# Patient Record
Sex: Female | Born: 1950 | Race: White | Hispanic: No | Marital: Married | Smoking: Never smoker
Health system: Southern US, Community
[De-identification: ages and names within clinical notes are randomized; demographics above are authoritative.]

## PROBLEM LIST (undated history)

## (undated) DIAGNOSIS — E119 Type 2 diabetes mellitus without complications: Secondary | ICD-10-CM

## (undated) DIAGNOSIS — R0600 Dyspnea, unspecified: Secondary | ICD-10-CM

## (undated) DIAGNOSIS — G4730 Sleep apnea, unspecified: Secondary | ICD-10-CM

## (undated) DIAGNOSIS — I5042 Chronic combined systolic (congestive) and diastolic (congestive) heart failure: Secondary | ICD-10-CM

## (undated) DIAGNOSIS — I10 Essential (primary) hypertension: Secondary | ICD-10-CM

## (undated) DIAGNOSIS — K219 Gastro-esophageal reflux disease without esophagitis: Secondary | ICD-10-CM

## (undated) DIAGNOSIS — E785 Hyperlipidemia, unspecified: Secondary | ICD-10-CM

## (undated) HISTORY — DX: Type 2 diabetes mellitus without complications: E11.9

## (undated) HISTORY — DX: Chronic combined systolic (congestive) and diastolic (congestive) heart failure: I50.42

## (undated) HISTORY — PX: NO PAST SURGERIES: SHX2092

## (undated) HISTORY — DX: Gastro-esophageal reflux disease without esophagitis: K21.9

## (undated) HISTORY — DX: Hyperlipidemia, unspecified: E78.5

---

## 2003-03-10 ENCOUNTER — Ambulatory Visit (HOSPITAL_COMMUNITY): Admission: RE | Admit: 2003-03-10 | Discharge: 2003-03-10 | Payer: Self-pay | Admitting: Family Medicine

## 2003-03-10 ENCOUNTER — Encounter: Payer: Self-pay | Admitting: Family Medicine

## 2005-01-16 ENCOUNTER — Ambulatory Visit (HOSPITAL_COMMUNITY): Admission: RE | Admit: 2005-01-16 | Discharge: 2005-01-16 | Payer: Self-pay | Admitting: Family Medicine

## 2008-09-29 ENCOUNTER — Ambulatory Visit (HOSPITAL_COMMUNITY): Admission: RE | Admit: 2008-09-29 | Discharge: 2008-09-29 | Payer: Self-pay | Admitting: Family Medicine

## 2009-10-01 ENCOUNTER — Ambulatory Visit (HOSPITAL_COMMUNITY): Admission: RE | Admit: 2009-10-01 | Discharge: 2009-10-01 | Payer: Self-pay | Admitting: Family Medicine

## 2010-11-01 ENCOUNTER — Other Ambulatory Visit (HOSPITAL_COMMUNITY): Payer: Self-pay | Admitting: Family Medicine

## 2010-11-01 DIAGNOSIS — Z139 Encounter for screening, unspecified: Secondary | ICD-10-CM

## 2010-11-04 ENCOUNTER — Ambulatory Visit (HOSPITAL_COMMUNITY)
Admission: RE | Admit: 2010-11-04 | Discharge: 2010-11-04 | Disposition: A | Discharge status: Home / Self Care | Payer: Self-pay | Source: Ambulatory Visit | Attending: Family Medicine | Admitting: Family Medicine

## 2010-11-04 DIAGNOSIS — Z139 Encounter for screening, unspecified: Secondary | ICD-10-CM

## 2011-12-05 ENCOUNTER — Other Ambulatory Visit (HOSPITAL_COMMUNITY): Payer: Self-pay | Admitting: Family Medicine

## 2011-12-05 DIAGNOSIS — Z1231 Encounter for screening mammogram for malignant neoplasm of breast: Secondary | ICD-10-CM

## 2011-12-07 ENCOUNTER — Ambulatory Visit (HOSPITAL_COMMUNITY)
Admission: RE | Admit: 2011-12-07 | Discharge: 2011-12-07 | Disposition: A | Discharge status: Home / Self Care | Payer: Self-pay | Source: Ambulatory Visit | Attending: Family Medicine | Admitting: Family Medicine

## 2011-12-07 DIAGNOSIS — Z1231 Encounter for screening mammogram for malignant neoplasm of breast: Secondary | ICD-10-CM

## 2013-11-27 ENCOUNTER — Other Ambulatory Visit (HOSPITAL_COMMUNITY): Payer: Self-pay | Admitting: Family Medicine

## 2013-11-27 DIAGNOSIS — Z1231 Encounter for screening mammogram for malignant neoplasm of breast: Secondary | ICD-10-CM

## 2013-12-08 ENCOUNTER — Ambulatory Visit (HOSPITAL_COMMUNITY)
Admission: RE | Admit: 2013-12-08 | Discharge: 2013-12-08 | Disposition: A | Discharge status: Home / Self Care | Payer: BC Managed Care – PPO | Source: Ambulatory Visit | Attending: Family Medicine | Admitting: Family Medicine

## 2013-12-08 DIAGNOSIS — Z1231 Encounter for screening mammogram for malignant neoplasm of breast: Secondary | ICD-10-CM | POA: Insufficient documentation

## 2014-11-17 ENCOUNTER — Other Ambulatory Visit (HOSPITAL_COMMUNITY): Payer: Self-pay | Admitting: Family Medicine

## 2014-11-17 DIAGNOSIS — Z1231 Encounter for screening mammogram for malignant neoplasm of breast: Secondary | ICD-10-CM

## 2014-12-11 ENCOUNTER — Ambulatory Visit (HOSPITAL_COMMUNITY)
Admission: RE | Admit: 2014-12-11 | Discharge: 2014-12-11 | Disposition: A | Discharge status: Home / Self Care | Payer: 59 | Source: Ambulatory Visit | Attending: Family Medicine | Admitting: Family Medicine

## 2014-12-11 DIAGNOSIS — Z1231 Encounter for screening mammogram for malignant neoplasm of breast: Secondary | ICD-10-CM

## 2015-12-15 ENCOUNTER — Other Ambulatory Visit (HOSPITAL_COMMUNITY): Payer: Self-pay | Admitting: Internal Medicine

## 2015-12-15 DIAGNOSIS — Z1231 Encounter for screening mammogram for malignant neoplasm of breast: Secondary | ICD-10-CM

## 2015-12-17 ENCOUNTER — Ambulatory Visit (HOSPITAL_COMMUNITY)
Admission: RE | Admit: 2015-12-17 | Discharge: 2015-12-17 | Disposition: A | Discharge status: Home / Self Care | Payer: BLUE CROSS/BLUE SHIELD | Source: Ambulatory Visit | Attending: Internal Medicine | Admitting: Internal Medicine

## 2015-12-17 DIAGNOSIS — Z1231 Encounter for screening mammogram for malignant neoplasm of breast: Secondary | ICD-10-CM | POA: Diagnosis present

## 2016-01-12 ENCOUNTER — Encounter: Payer: Self-pay | Admitting: Internal Medicine

## 2016-06-13 ENCOUNTER — Encounter (INDEPENDENT_AMBULATORY_CARE_PROVIDER_SITE_OTHER): Payer: Self-pay | Admitting: *Deleted

## 2016-11-06 ENCOUNTER — Encounter (INDEPENDENT_AMBULATORY_CARE_PROVIDER_SITE_OTHER): Payer: Self-pay | Admitting: *Deleted

## 2016-11-06 ENCOUNTER — Other Ambulatory Visit (INDEPENDENT_AMBULATORY_CARE_PROVIDER_SITE_OTHER): Payer: Self-pay | Admitting: *Deleted

## 2016-11-06 ENCOUNTER — Telehealth (INDEPENDENT_AMBULATORY_CARE_PROVIDER_SITE_OTHER): Payer: Self-pay | Admitting: *Deleted

## 2016-11-06 DIAGNOSIS — Z1211 Encounter for screening for malignant neoplasm of colon: Secondary | ICD-10-CM

## 2016-11-06 MED ORDER — SOD PHOS MONO-SOD PHOS DIBASIC 1.102-0.398 G PO TABS: 1.0000 | ORAL_TABLET | Freq: Once | ORAL | 0 refills | Status: AC

## 2016-11-06 NOTE — Telephone Encounter (Signed)
Patient needs osmo pills 

## 2016-11-14 ENCOUNTER — Encounter (INDEPENDENT_AMBULATORY_CARE_PROVIDER_SITE_OTHER): Payer: Self-pay | Admitting: *Deleted

## 2016-11-14 ENCOUNTER — Telehealth (INDEPENDENT_AMBULATORY_CARE_PROVIDER_SITE_OTHER): Payer: Self-pay | Admitting: *Deleted

## 2016-11-14 NOTE — Telephone Encounter (Signed)
Patient needs trilyte 

## 2016-11-15 MED ORDER — PEG 3350-KCL-NA BICARB-NACL 420 G PO SOLR: 4000.0000 mL | Freq: Once | ORAL | 0 refills | Status: AC

## 2016-11-22 ENCOUNTER — Telehealth (INDEPENDENT_AMBULATORY_CARE_PROVIDER_SITE_OTHER): Payer: Self-pay | Admitting: *Deleted

## 2016-11-22 NOTE — Telephone Encounter (Signed)
agree

## 2016-11-22 NOTE — Telephone Encounter (Signed)
Referring MD/PCP: Jeneen Rinks kim   Procedure: tcs  Reason/Indication:  screening  Has patient had this procedure before?  no  If so, when, by whom and where?    Is there a family history of colon cancer?  no  Who?  What age when diagnosed?    Is patient diabetic?   no      Does patient have prosthetic heart valve or mechanical valve?  no  Do you have a pacemaker?  no  Has patient ever had endocarditis? no  Has patient had joint replacement within last 12 months?  no  Does patient tend to be constipated or take laxatives? no  Does patient have a history of alcohol/drug use?  no  Is patient on Coumadin, Plavix and/or Aspirin? yes  Medications: asa 81 mg daily, losartan 100/25 mg daily, amlodipine 10 mg daily, hctz 25 mg daily  Allergies: nkda  Medication Adjustment per Dr Laural Golden: asa 2 days  Procedure date & time: 12/21/16 at 930

## 2016-12-21 ENCOUNTER — Ambulatory Visit (HOSPITAL_COMMUNITY)
Admission: RE | Admit: 2016-12-21 | Discharge: 2016-12-21 | Disposition: A | Discharge status: Home / Self Care | Payer: BLUE CROSS/BLUE SHIELD | Source: Ambulatory Visit | Attending: Internal Medicine | Admitting: Internal Medicine

## 2016-12-21 ENCOUNTER — Encounter (HOSPITAL_COMMUNITY)
Admission: RE | Disposition: A | Discharge status: Home / Self Care | Payer: Self-pay | Source: Ambulatory Visit | Attending: Internal Medicine

## 2016-12-21 ENCOUNTER — Encounter (HOSPITAL_COMMUNITY): Payer: Self-pay | Admitting: *Deleted

## 2016-12-21 DIAGNOSIS — D123 Benign neoplasm of transverse colon: Secondary | ICD-10-CM | POA: Diagnosis not present

## 2016-12-21 DIAGNOSIS — K5730 Diverticulosis of large intestine without perforation or abscess without bleeding: Secondary | ICD-10-CM | POA: Insufficient documentation

## 2016-12-21 DIAGNOSIS — Z1211 Encounter for screening for malignant neoplasm of colon: Secondary | ICD-10-CM | POA: Diagnosis not present

## 2016-12-21 DIAGNOSIS — Z79899 Other long term (current) drug therapy: Secondary | ICD-10-CM | POA: Insufficient documentation

## 2016-12-21 DIAGNOSIS — K621 Rectal polyp: Secondary | ICD-10-CM | POA: Insufficient documentation

## 2016-12-21 DIAGNOSIS — Z7982 Long term (current) use of aspirin: Secondary | ICD-10-CM | POA: Insufficient documentation

## 2016-12-21 DIAGNOSIS — K648 Other hemorrhoids: Secondary | ICD-10-CM

## 2016-12-21 DIAGNOSIS — I10 Essential (primary) hypertension: Secondary | ICD-10-CM | POA: Diagnosis not present

## 2016-12-21 HISTORY — DX: Essential (primary) hypertension: I10

## 2016-12-21 HISTORY — PX: COLONOSCOPY: SHX5424

## 2016-12-21 SURGERY — COLONOSCOPY
Anesthesia: Moderate Sedation

## 2016-12-21 MED ORDER — STERILE WATER FOR IRRIGATION IR SOLN
Status: DC | PRN
  Administered 2016-12-21: 4 mL

## 2016-12-21 MED ORDER — MIDAZOLAM HCL 5 MG/5ML IJ SOLN
INTRAMUSCULAR | Status: DC | PRN
  Administered 2016-12-21 (×2): 2 mg via INTRAVENOUS

## 2016-12-21 MED ORDER — MIDAZOLAM HCL 5 MG/5ML IJ SOLN
INTRAMUSCULAR | Status: AC
  Filled 2016-12-21: qty 10

## 2016-12-21 MED ORDER — MEPERIDINE HCL 50 MG/ML IJ SOLN
INTRAMUSCULAR | Status: AC
  Filled 2016-12-21: qty 1

## 2016-12-21 MED ORDER — SODIUM CHLORIDE 0.9 % IV SOLN
INTRAVENOUS | Status: DC
  Administered 2016-12-21: 10:00:00 via INTRAVENOUS

## 2016-12-21 MED ORDER — MEPERIDINE HCL 50 MG/ML IJ SOLN
INTRAMUSCULAR | Status: DC | PRN
  Administered 2016-12-21 (×2): 25 mg via INTRAVENOUS

## 2016-12-21 NOTE — Discharge Instructions (Addendum)
High-Fiber Diet Fiber, also called dietary fiber, is a type of carbohydrate found in fruits, vegetables, whole grains, and beans. A high-fiber diet can have many health benefits. Your health care provider may recommend a high-fiber diet to help:  Prevent constipation. Fiber can make your bowel movements more regular.  Lower your cholesterol.  Relieve hemorrhoids, uncomplicated diverticulosis, or irritable bowel syndrome.  Prevent overeating as part of a weight-loss plan.  Prevent heart disease, type 2 diabetes, and certain cancers. What is my plan? The recommended daily intake of fiber includes:  38 grams for men under age 6.  64 grams for men over age 17.  69 grams for women under age 52.  31 grams for women over age 10. You can get the recommended daily intake of dietary fiber by eating a variety of fruits, vegetables, grains, and beans. Your health care provider may also recommend a fiber supplement if it is not possible to get enough fiber through your diet. What do I need to know about a high-fiber diet?  Fiber supplements have not been widely studied for their effectiveness, so it is better to get fiber through food sources.  Always check the fiber content on thenutrition facts label of any prepackaged food. Look for foods that contain at least 5 grams of fiber per serving.  Ask your dietitian if you have questions about specific foods that are related to your condition, especially if those foods are not listed in the following section.  Increase your daily fiber consumption gradually. Increasing your intake of dietary fiber too quickly may cause bloating, cramping, or gas.  Drink plenty of water. Water helps you to digest fiber. What foods can I eat? Grains  Whole-grain breads. Multigrain cereal. Oats and oatmeal. Brown rice. Barley. Bulgur wheat. [place]. Bran muffins. Popcorn. Rye wafer crackers. Vegetables  Sweet potatoes. Spinach. Kale. Artichokes. Cabbage. Broccoli.  Green peas. Carrots. Squash. Fruits  Berries. Pears. Apples. Oranges. Avocados. Prunes and raisins. Dried figs. Meats and Other Protein Sources  [place], kidney, pinto, and soy beans. Split peas. Lentils. Nuts and seeds. Dairy  Fiber-fortified yogurt. Beverages  Fiber-fortified soy milk. Fiber-fortified orange juice. Other  Fiber bars. The items listed above may not be a complete list of recommended foods or beverages. Contact your dietitian for more options.  What foods are not recommended? Grains  White bread. Pasta made with refined flour. White rice. Vegetables  Fried potatoes. Canned vegetables. Well-cooked vegetables. Fruits  Fruit juice. Cooked, strained fruit. Meats and Other Protein Sources  Fatty cuts of meat. Fried Sales executive or fried fish. Dairy  Milk. Yogurt. Cream cheese. Sour cream. Beverages  Soft drinks. Other  Cakes and pastries. Butter and oils. The items listed above may not be a complete list of foods and beverages to avoid. Contact your dietitian for more information.  What are some tips for including high-fiber foods in my diet?  Eat a wide variety of high-fiber foods.  Make sure that half of all grains consumed each day are whole grains.  Replace breads and cereals made from refined flour or white flour with whole-grain breads and cereals.  Replace white rice with brown rice, bulgur wheat, or millet.  Start the day with a breakfast that is high in fiber, such as a cereal that contains at least 5 grams of fiber per serving.  Use beans in place of meat in soups, salads, or pasta.  Eat high-fiber snacks, such as berries, raw vegetables, nuts, or popcorn. This information is not intended to replace  advice given to you by your health care provider. Make sure you discuss any questions you have with your health care provider. Document Released: 09/11/2005 Document Revised: 02/17/2016 Document Reviewed: 02/24/2014 Elsevier Interactive Patient Education  2017  [place].     Colon Polyps Polyps are tissue growths inside the body. Polyps can grow in many places, including the large intestine (colon). A polyp may be a round bump or a mushroom-shaped growth. You could have one polyp or several. Most colon polyps are noncancerous (benign). However, some colon polyps can become cancerous over time. What are the causes? The exact cause of colon polyps is not known. What increases the risk? This condition is more likely to develop in people who:  Have a family history of colon cancer or colon polyps.  Are older than 53 or older than 45 if they are African American.  Have inflammatory bowel disease, such as ulcerative colitis or Crohn disease.  Are overweight.  Smoke cigarettes.  Do not get enough exercise.  Drink too much alcohol.  Eat a diet that is:  High in fat and red meat.  Low in fiber.  Had childhood cancer that was treated with abdominal radiation. What are the signs or symptoms? Most polyps do not cause symptoms. If you have symptoms, they may include:  Blood coming from your rectum when having a bowel movement.  Blood in your stool.The stool may look dark red or black.  A change in bowel habits, such as constipation or diarrhea. How is this diagnosed? This condition is diagnosed with a colonoscopy. This is a procedure that uses a lighted, flexible scope to look at the inside of your colon. How is this treated? Treatment for this condition involves removing any polyps that are found. Those polyps will then be tested for cancer. If cancer is found, your health care provider will talk to you about options for colon cancer treatment. Follow these instructions at home: Diet   Eat plenty of fiber, such as fruits, vegetables, and whole grains.  Eat foods that are high in calcium and vitamin D, such as milk, cheese, yogurt, eggs, liver, fish, and broccoli.  Limit foods high in fat, red meats, and processed meats, such as  hot dogs, sausage, bacon, and lunch meats.  Maintain a healthy weight, or lose weight if recommended by your health care provider. General instructions   Do not smoke cigarettes.  Do not drink alcohol excessively.  Keep all follow-up visits as told by your health care provider. This is important. This includes keeping regularly scheduled colonoscopies. Talk to your health care provider about when you need a colonoscopy.  Exercise every day or as told by your health care provider. Contact a health care provider if:  You have new or worsening bleeding during a bowel movement.  You have new or increased blood in your stool.  You have a change in bowel habits.  You unexpectedly lose weight. This information is not intended to replace advice given to you by your health care provider. Make sure you discuss any questions you have with your health care provider. Document Released: 06/07/2004 Document Revised: 02/17/2016 Document Reviewed: 08/02/2015 Elsevier Interactive Patient Education  2017 [place]. Colonoscopy, Adult, Care After This sheet gives you information about how to care for yourself after your procedure. Your health care provider may also give you more specific instructions. If you have problems or questions, contact your health care provider. What can I expect after the procedure? After the procedure, it  is common to have:  A small amount of blood in your stool for 24 hours after the procedure.  Some gas.  Mild abdominal cramping or bloating. Follow these instructions at home: General instructions    For the first 24 hours after the procedure:  Do not drive or use machinery.  Do not sign important documents.  Do not drink alcohol.  Do your regular daily activities at a slower pace than normal.  Eat soft, easy-to-digest foods.  Rest often.  Take over-the-counter or prescription medicines only as told by your health care provider.  It is up to you to  get the results of your procedure. Ask your health care provider, or the department performing the procedure, when your results will be ready. Relieving cramping and bloating   Try walking around when you have cramps or feel bloated.  Apply heat to your abdomen as told by your health care provider. Use a heat source that your health care provider recommends, such as a moist heat pack or a heating pad.  Place a towel between your skin and the heat source.  Leave the heat on for 20-30 minutes.  Remove the heat if your skin turns bright red. This is especially important if you are unable to feel pain, heat, or cold. You may have a greater risk of getting burned. Eating and drinking   Drink enough fluid to keep your urine clear or pale yellow.  Resume your normal diet as instructed by your health care provider. Avoid heavy or fried foods that are hard to digest.  Avoid drinking alcohol for as long as instructed by your health care provider. Contact a health care provider if:  You have blood in your stool 2-3 days after the procedure. Get help right away if:  You have more than a small spotting of blood in your stool.  You pass large blood clots in your stool.  Your abdomen is swollen.  You have nausea or vomiting.  You have a fever.  You have increasing abdominal pain that is not relieved with medicine. This information is not intended to replace advice given to you by your health care provider. Make sure you discuss any questions you have with your health care provider. Document Released: 04/25/2004 Document Revised: 06/05/2016 Document Reviewed: 11/23/2015 Elsevier Interactive Patient Education  2017 [place].        No aspirin or NSAIDs for 1 week. Resume other medications and high fiber diet. No driving for 24 hours. Physician will call with biopsy results.

## 2016-12-21 NOTE — Op Note (Signed)
[facility] Patient Name: Kristina [name] Procedure Date: 12/21/2016 11:02 AM MRN: [id number] Date of Birth: 1950/12/10 Attending MD: Hildred Laser , MD CSN: [id number] Age: 66 Admit Type: Outpatient Procedure:                Colonoscopy Indications:              Screening for colorectal malignant neoplasm Providers:                Hildred Laser, MD, Otis Peak B. Sharon Seller, RN, Aram Candela Referring MD:             Arlyss Repress, MD Medicines:                Meperidine 50 mg IV, Midazolam 4 mg IV Complications:            No immediate complications. Estimated Blood Loss:     Estimated blood loss: none. Procedure:                Pre-Anesthesia Assessment:                           - Prior to the procedure, a History and Physical                            was performed, and patient medications and                            allergies were reviewed. The patient's tolerance of                            previous anesthesia was also reviewed. The risks                            and benefits of the procedure and the sedation                            options and risks were discussed with the patient.                            All questions were answered, and informed consent                            was obtained. Prior Anticoagulants: The patient                            last took aspirin 2 days prior to the procedure.                            ASA Grade Assessment: II - A patient with mild                            systemic disease. After reviewing the risks and  benefits, the patient was deemed in satisfactory                            condition to undergo the procedure.                           After obtaining informed consent, the colonoscope                            was passed under direct vision. Throughout the                            procedure, the patient's blood pressure, pulse, and   oxygen saturations were monitored continuously. The                            EC-[id number] ([id number]) scope was introduced through                            the anus and advanced to the the cecum, identified                            by appendiceal orifice and ileocecal valve. The                            colonoscopy was performed without difficulty. The                            patient tolerated the procedure well. The quality                            of the bowel preparation was good. The ileocecal                            valve, appendiceal orifice, and rectum were                            photographed. Scope In: 11:22:21 AM Scope Out: 11:37:11 AM Scope Withdrawal Time: 0 hours 12 minutes 8 seconds  Total Procedure Duration: 0 hours 14 minutes 50 seconds  Findings:      The digital rectal exam was normal.      A small polyp was found in the transverse colon. The polyp was sessile.       Biopsies were taken with a cold forceps for histology. The pathology       specimen was placed into Bottle Number 1.      A few medium-mouthed diverticula were found in the sigmoid colon.      A 7 mm polyp was found in the rectum. The polyp was semi-pedunculated.       The polyp was removed with a hot snare. Resection and retrieval were       complete. The pathology specimen was placed into Bottle Number 1.      Internal hemorrhoids were found during retroflexion. The hemorrhoids       were small. Impression:               -  One small polyp in the transverse colon. Biopsied.                           - Diverticulosis in the sigmoid colon.                           - One 7 mm polyp in the rectum, removed with a hot                            snare. Resected and retrieved.                           - Internal hemorrhoids. Moderate Sedation:      Moderate (conscious) sedation was administered by the endoscopy nurse       and supervised by the endoscopist. The following parameters were        monitored: oxygen saturation, heart rate, blood pressure, CO2       capnography and response to care. Total physician intraservice time was       20 minutes. Recommendation:           - Patient has a contact number available for                            emergencies. The signs and symptoms of potential                            delayed complications were discussed with the                            patient. Return to normal activities tomorrow.                            Written discharge instructions were provided to the                            patient.                           - High fiber diet today.                           - Continue present medications.                           - No aspirin, ibuprofen, naproxen, or other                            non-steroidal anti-inflammatory drugs for 7 days                            after polyp removal.                           - Await pathology results.                           -  Repeat colonoscopy date to be determined after                            pending pathology results are reviewed. Procedure Code(s):        --- Professional ---                           [phone], Colonoscopy, flexible; with removal of                            tumor(s), polyp(s), or other lesion(s) by snare                            technique                           45380, 59, Colonoscopy, flexible; with biopsy,                            single or multiple                           99152, Moderate sedation services provided by the                            same physician or other qualified health care                            professional performing the diagnostic or                            therapeutic service that the sedation supports,                            requiring the presence of an independent trained                            observer to assist in the monitoring of the                            patient's level of consciousness and  physiological                            status; initial 15 minutes of intraservice time,                            patient age 3 years or older Diagnosis Code(s):        --- Professional ---                           Z12.11, Encounter for screening for malignant                            neoplasm of colon  D12.3, Benign neoplasm of transverse colon (hepatic                            flexure or splenic flexure)                           K62.1, Rectal polyp                           K64.8, Other hemorrhoids                           K57.30, Diverticulosis of large intestine without                            perforation or abscess without bleeding CPT copyright 2016 American Medical Association. All rights reserved. The codes documented in this report are preliminary and upon coder review may  be revised to meet current compliance requirements. Hildred Laser, MD Hildred Laser, MD 12/21/2016 11:47:19 AM This report has been signed electronically. Number of Addenda: 0

## 2016-12-21 NOTE — H&P (Signed)
Kristina [name] is an 66 y.o. female.   Chief Complaint: Patient is here for colonoscopy. HPI: Patient is 66 year old Caucasian female who is in for screening colonoscopy. This is patient's first exam. She denies abdominal pain change in bowel habits or rectal bleeding. Family history is negative for CRC.  Past Medical History:  Diagnosis Date  . Hypertension     Past Surgical History:  Procedure Laterality Date  . NO PAST SURGERIES      Family History  Problem Relation Age of Onset  . Colon cancer Neg Hx    Social History:  reports that she has never smoked. She has never used smokeless tobacco. She reports that she does not drink alcohol or use drugs.  Allergies: No Known Allergies  Medications Prior to Admission  Medication Sig Dispense Refill  . amLODipine (NORVASC) 10 MG tablet Take 10 mg by mouth daily.    Marland Kitchen aspirin EC 81 MG tablet Take 81 mg by mouth daily.    . hydrochlorothiazide (HYDRODIURIL) 25 MG tablet Take 25 mg by mouth daily.    Marland Kitchen losartan-hydrochlorothiazide (HYZAAR) 100-25 MG tablet Take 1 tablet by mouth daily.      No results found for this or any previous visit (from the past 48 hour(s)). No results found.  ROS  Blood pressure (!) 153/81, pulse 92, temperature 97.6 F (36.4 C), temperature source Oral, resp. rate (!) 23, height 4\' 11"  (1.499 m), weight 166 lb (75.3 kg), SpO2 95 %. Physical Exam  Constitutional: She appears well-developed and well-nourished.  HENT:  Mouth/Throat: Oropharynx is clear and moist.  Eyes: Conjunctivae are normal. No scleral icterus.  Neck: No thyromegaly present.  Cardiovascular: Normal rate, regular rhythm and normal heart sounds.   No murmur heard. Respiratory: Effort normal and breath sounds normal.  GI: Soft. She exhibits no distension and no mass. There is no tenderness.  Musculoskeletal: She exhibits no edema.  Lymphadenopathy:    She has no cervical adenopathy.  Neurological: She is alert.  Skin: Skin is warm  and dry.     Assessment/Plan Average risk screening colonoscopy.  Hildred Laser, MD 12/21/2016, 11:13 AM

## 2017-01-02 ENCOUNTER — Encounter (HOSPITAL_COMMUNITY): Payer: Self-pay | Admitting: Internal Medicine

## 2017-02-22 DIAGNOSIS — R69 Illness, unspecified: Secondary | ICD-10-CM | POA: Diagnosis not present

## 2017-04-24 DIAGNOSIS — R5383 Other fatigue: Secondary | ICD-10-CM | POA: Diagnosis not present

## 2017-04-24 DIAGNOSIS — I10 Essential (primary) hypertension: Secondary | ICD-10-CM | POA: Diagnosis not present

## 2017-05-09 DIAGNOSIS — Z713 Dietary counseling and surveillance: Secondary | ICD-10-CM | POA: Diagnosis not present

## 2017-05-09 DIAGNOSIS — Z0001 Encounter for general adult medical examination with abnormal findings: Secondary | ICD-10-CM | POA: Diagnosis not present

## 2017-05-09 DIAGNOSIS — I10 Essential (primary) hypertension: Secondary | ICD-10-CM | POA: Diagnosis not present

## 2017-05-09 DIAGNOSIS — N390 Urinary tract infection, site not specified: Secondary | ICD-10-CM | POA: Diagnosis not present

## 2017-05-23 DIAGNOSIS — Z0100 Encounter for examination of eyes and vision without abnormal findings: Secondary | ICD-10-CM | POA: Diagnosis not present

## 2017-05-23 DIAGNOSIS — R69 Illness, unspecified: Secondary | ICD-10-CM | POA: Diagnosis not present

## 2017-05-23 DIAGNOSIS — I10 Essential (primary) hypertension: Secondary | ICD-10-CM | POA: Diagnosis not present

## 2017-05-23 DIAGNOSIS — H5200 Hypermetropia, unspecified eye: Secondary | ICD-10-CM | POA: Diagnosis not present

## 2017-07-31 DIAGNOSIS — K648 Other hemorrhoids: Secondary | ICD-10-CM | POA: Diagnosis not present

## 2017-07-31 DIAGNOSIS — K5900 Constipation, unspecified: Secondary | ICD-10-CM | POA: Diagnosis not present

## 2017-07-31 DIAGNOSIS — J309 Allergic rhinitis, unspecified: Secondary | ICD-10-CM | POA: Diagnosis not present

## 2017-08-07 ENCOUNTER — Other Ambulatory Visit (HOSPITAL_COMMUNITY): Payer: Self-pay | Admitting: Family Medicine

## 2017-08-07 DIAGNOSIS — Z780 Asymptomatic menopausal state: Secondary | ICD-10-CM

## 2017-08-13 ENCOUNTER — Ambulatory Visit (HOSPITAL_COMMUNITY)
Admission: RE | Admit: 2017-08-13 | Discharge: 2017-08-13 | Disposition: A | Discharge status: Home / Self Care | Payer: Medicare HMO | Source: Ambulatory Visit | Attending: Family Medicine | Admitting: Family Medicine

## 2017-08-13 DIAGNOSIS — Z780 Asymptomatic menopausal state: Secondary | ICD-10-CM | POA: Insufficient documentation

## 2017-08-13 DIAGNOSIS — M8588 Other specified disorders of bone density and structure, other site: Secondary | ICD-10-CM | POA: Diagnosis not present

## 2017-08-13 DIAGNOSIS — M8589 Other specified disorders of bone density and structure, multiple sites: Secondary | ICD-10-CM | POA: Diagnosis not present

## 2017-08-13 DIAGNOSIS — Z13820 Encounter for screening for osteoporosis: Secondary | ICD-10-CM | POA: Diagnosis not present

## 2017-08-29 DIAGNOSIS — R69 Illness, unspecified: Secondary | ICD-10-CM | POA: Diagnosis not present

## 2017-09-01 DIAGNOSIS — R69 Illness, unspecified: Secondary | ICD-10-CM | POA: Diagnosis not present

## 2017-09-01 DIAGNOSIS — R3915 Urgency of urination: Secondary | ICD-10-CM | POA: Diagnosis not present

## 2017-09-01 DIAGNOSIS — Z0000 Encounter for general adult medical examination without abnormal findings: Secondary | ICD-10-CM | POA: Diagnosis not present

## 2017-09-01 DIAGNOSIS — Z8249 Family history of ischemic heart disease and other diseases of the circulatory system: Secondary | ICD-10-CM | POA: Diagnosis not present

## 2017-09-01 DIAGNOSIS — Z9181 History of falling: Secondary | ICD-10-CM | POA: Diagnosis not present

## 2017-09-01 DIAGNOSIS — Z79899 Other long term (current) drug therapy: Secondary | ICD-10-CM | POA: Diagnosis not present

## 2017-09-01 DIAGNOSIS — Z6836 Body mass index (BMI) 36.0-36.9, adult: Secondary | ICD-10-CM | POA: Diagnosis not present

## 2017-09-01 DIAGNOSIS — R279 Unspecified lack of coordination: Secondary | ICD-10-CM | POA: Diagnosis not present

## 2017-09-01 DIAGNOSIS — I10 Essential (primary) hypertension: Secondary | ICD-10-CM | POA: Diagnosis not present

## 2017-09-25 DIAGNOSIS — 419620001 Death: Secondary | SNOMED CT | POA: Diagnosis not present

## 2017-09-25 DEATH — deceased

## 2017-11-01 DIAGNOSIS — I10 Essential (primary) hypertension: Secondary | ICD-10-CM | POA: Diagnosis not present

## 2017-11-07 DIAGNOSIS — Z79899 Other long term (current) drug therapy: Secondary | ICD-10-CM | POA: Diagnosis not present

## 2017-11-07 DIAGNOSIS — I10 Essential (primary) hypertension: Secondary | ICD-10-CM | POA: Diagnosis not present

## 2017-11-07 DIAGNOSIS — M8580 Other specified disorders of bone density and structure, unspecified site: Secondary | ICD-10-CM | POA: Diagnosis not present

## 2018-01-30 DIAGNOSIS — I10 Essential (primary) hypertension: Secondary | ICD-10-CM | POA: Diagnosis not present

## 2018-01-30 DIAGNOSIS — Z79899 Other long term (current) drug therapy: Secondary | ICD-10-CM | POA: Diagnosis not present

## 2018-01-30 DIAGNOSIS — M8580 Other specified disorders of bone density and structure, unspecified site: Secondary | ICD-10-CM | POA: Diagnosis not present

## 2018-02-07 DIAGNOSIS — I10 Essential (primary) hypertension: Secondary | ICD-10-CM | POA: Diagnosis not present

## 2018-02-13 DIAGNOSIS — I10 Essential (primary) hypertension: Secondary | ICD-10-CM | POA: Diagnosis not present

## 2018-02-13 DIAGNOSIS — Z0131 Encounter for examination of blood pressure with abnormal findings: Secondary | ICD-10-CM | POA: Diagnosis not present

## 2018-02-13 DIAGNOSIS — Z79899 Other long term (current) drug therapy: Secondary | ICD-10-CM | POA: Diagnosis not present

## 2018-02-13 DIAGNOSIS — R319 Hematuria, unspecified: Secondary | ICD-10-CM | POA: Diagnosis not present

## 2018-02-28 DIAGNOSIS — I10 Essential (primary) hypertension: Secondary | ICD-10-CM | POA: Diagnosis not present

## 2018-02-28 DIAGNOSIS — Z79899 Other long term (current) drug therapy: Secondary | ICD-10-CM | POA: Diagnosis not present

## 2018-03-12 DIAGNOSIS — R69 Illness, unspecified: Secondary | ICD-10-CM | POA: Diagnosis not present

## 2018-04-29 DIAGNOSIS — R69 Illness, unspecified: Secondary | ICD-10-CM | POA: Diagnosis not present

## 2018-04-29 DIAGNOSIS — N179 Acute kidney failure, unspecified: Secondary | ICD-10-CM | POA: Diagnosis not present

## 2018-04-29 DIAGNOSIS — J449 Chronic obstructive pulmonary disease, unspecified: Secondary | ICD-10-CM | POA: Diagnosis not present

## 2018-05-17 DIAGNOSIS — R319 Hematuria, unspecified: Secondary | ICD-10-CM | POA: Diagnosis not present

## 2018-05-17 DIAGNOSIS — Z79899 Other long term (current) drug therapy: Secondary | ICD-10-CM | POA: Diagnosis not present

## 2018-05-17 DIAGNOSIS — N390 Urinary tract infection, site not specified: Secondary | ICD-10-CM | POA: Diagnosis not present

## 2018-05-17 DIAGNOSIS — I10 Essential (primary) hypertension: Secondary | ICD-10-CM | POA: Diagnosis not present

## 2018-05-23 DIAGNOSIS — Z79899 Other long term (current) drug therapy: Secondary | ICD-10-CM | POA: Diagnosis not present

## 2018-05-23 DIAGNOSIS — I10 Essential (primary) hypertension: Secondary | ICD-10-CM | POA: Diagnosis not present

## 2018-07-03 ENCOUNTER — Other Ambulatory Visit (HOSPITAL_COMMUNITY): Payer: Self-pay | Admitting: Family Medicine

## 2018-07-03 ENCOUNTER — Ambulatory Visit (HOSPITAL_COMMUNITY)
Admission: RE | Admit: 2018-07-03 | Discharge: 2018-07-03 | Disposition: A | Discharge status: Home / Self Care | Payer: Medicare HMO | Source: Ambulatory Visit | Attending: Family Medicine | Admitting: Family Medicine

## 2018-07-03 DIAGNOSIS — S4991XA Unspecified injury of right shoulder and upper arm, initial encounter: Secondary | ICD-10-CM | POA: Diagnosis not present

## 2018-07-03 DIAGNOSIS — M25571 Pain in right ankle and joints of right foot: Secondary | ICD-10-CM

## 2018-07-03 DIAGNOSIS — M7989 Other specified soft tissue disorders: Secondary | ICD-10-CM | POA: Insufficient documentation

## 2018-07-03 DIAGNOSIS — S99921A Unspecified injury of right foot, initial encounter: Secondary | ICD-10-CM | POA: Diagnosis not present

## 2018-07-03 DIAGNOSIS — M25511 Pain in right shoulder: Secondary | ICD-10-CM

## 2018-07-03 DIAGNOSIS — S99911A Unspecified injury of right ankle, initial encounter: Secondary | ICD-10-CM | POA: Diagnosis not present

## 2018-07-03 DIAGNOSIS — M7751 Other enthesopathy of right foot: Secondary | ICD-10-CM | POA: Insufficient documentation

## 2018-07-03 DIAGNOSIS — W19XXXA Unspecified fall, initial encounter: Secondary | ICD-10-CM | POA: Diagnosis not present

## 2018-07-15 DIAGNOSIS — M25511 Pain in right shoulder: Secondary | ICD-10-CM | POA: Insufficient documentation

## 2018-07-15 DIAGNOSIS — M79671 Pain in right foot: Secondary | ICD-10-CM | POA: Insufficient documentation

## 2018-07-24 ENCOUNTER — Other Ambulatory Visit (HOSPITAL_COMMUNITY): Payer: Self-pay | Admitting: Sports Medicine

## 2018-07-24 DIAGNOSIS — M25511 Pain in right shoulder: Secondary | ICD-10-CM

## 2018-07-29 ENCOUNTER — Ambulatory Visit: Payer: Self-pay | Admitting: Orthopedic Surgery

## 2018-07-29 ENCOUNTER — Ambulatory Visit (HOSPITAL_COMMUNITY)
Admission: RE | Admit: 2018-07-29 | Discharge: 2018-07-29 | Disposition: A | Discharge status: Home / Self Care | Payer: Medicare HMO | Source: Ambulatory Visit | Attending: Sports Medicine | Admitting: Sports Medicine

## 2018-07-29 ENCOUNTER — Encounter

## 2018-07-29 DIAGNOSIS — M25511 Pain in right shoulder: Secondary | ICD-10-CM | POA: Diagnosis not present

## 2018-07-29 DIAGNOSIS — M7551 Bursitis of right shoulder: Secondary | ICD-10-CM | POA: Diagnosis not present

## 2018-08-10 DIAGNOSIS — R5383 Other fatigue: Secondary | ICD-10-CM | POA: Diagnosis not present

## 2018-08-10 DIAGNOSIS — I10 Essential (primary) hypertension: Secondary | ICD-10-CM | POA: Diagnosis not present

## 2018-08-10 DIAGNOSIS — Z79899 Other long term (current) drug therapy: Secondary | ICD-10-CM | POA: Diagnosis not present

## 2018-08-13 DIAGNOSIS — M79671 Pain in right foot: Secondary | ICD-10-CM | POA: Diagnosis not present

## 2018-08-13 DIAGNOSIS — M25511 Pain in right shoulder: Secondary | ICD-10-CM | POA: Diagnosis not present

## 2018-08-14 DIAGNOSIS — I10 Essential (primary) hypertension: Secondary | ICD-10-CM | POA: Diagnosis not present

## 2018-08-14 DIAGNOSIS — E6609 Other obesity due to excess calories: Secondary | ICD-10-CM | POA: Diagnosis not present

## 2018-08-14 DIAGNOSIS — E785 Hyperlipidemia, unspecified: Secondary | ICD-10-CM | POA: Diagnosis not present

## 2018-08-14 DIAGNOSIS — Z6838 Body mass index (BMI) 38.0-38.9, adult: Secondary | ICD-10-CM | POA: Diagnosis not present

## 2018-08-14 DIAGNOSIS — Z79899 Other long term (current) drug therapy: Secondary | ICD-10-CM | POA: Diagnosis not present

## 2018-08-14 DIAGNOSIS — Z0001 Encounter for general adult medical examination with abnormal findings: Secondary | ICD-10-CM | POA: Diagnosis not present

## 2018-08-14 DIAGNOSIS — Z1231 Encounter for screening mammogram for malignant neoplasm of breast: Secondary | ICD-10-CM | POA: Diagnosis not present

## 2018-08-17 DIAGNOSIS — R32 Unspecified urinary incontinence: Secondary | ICD-10-CM | POA: Diagnosis not present

## 2018-08-17 DIAGNOSIS — Z7982 Long term (current) use of aspirin: Secondary | ICD-10-CM | POA: Diagnosis not present

## 2018-08-17 DIAGNOSIS — I10 Essential (primary) hypertension: Secondary | ICD-10-CM | POA: Diagnosis not present

## 2018-08-17 DIAGNOSIS — Z825 Family history of asthma and other chronic lower respiratory diseases: Secondary | ICD-10-CM | POA: Diagnosis not present

## 2018-08-17 DIAGNOSIS — Z8249 Family history of ischemic heart disease and other diseases of the circulatory system: Secondary | ICD-10-CM | POA: Diagnosis not present

## 2018-08-17 DIAGNOSIS — Z6841 Body Mass Index (BMI) 40.0 and over, adult: Secondary | ICD-10-CM | POA: Diagnosis not present

## 2018-08-17 DIAGNOSIS — K5900 Constipation, unspecified: Secondary | ICD-10-CM | POA: Diagnosis not present

## 2018-08-17 DIAGNOSIS — Z803 Family history of malignant neoplasm of breast: Secondary | ICD-10-CM | POA: Diagnosis not present

## 2018-08-17 DIAGNOSIS — Z9181 History of falling: Secondary | ICD-10-CM | POA: Diagnosis not present

## 2018-09-09 DIAGNOSIS — M25511 Pain in right shoulder: Secondary | ICD-10-CM | POA: Diagnosis not present

## 2018-09-09 DIAGNOSIS — M79671 Pain in right foot: Secondary | ICD-10-CM | POA: Diagnosis not present

## 2018-09-11 ENCOUNTER — Other Ambulatory Visit (HOSPITAL_COMMUNITY): Payer: Self-pay | Admitting: Family Medicine

## 2018-09-11 DIAGNOSIS — Z79899 Other long term (current) drug therapy: Secondary | ICD-10-CM | POA: Diagnosis not present

## 2018-09-11 DIAGNOSIS — I10 Essential (primary) hypertension: Secondary | ICD-10-CM | POA: Diagnosis not present

## 2018-09-11 DIAGNOSIS — Z1231 Encounter for screening mammogram for malignant neoplasm of breast: Secondary | ICD-10-CM

## 2018-09-26 ENCOUNTER — Ambulatory Visit (HOSPITAL_COMMUNITY): Payer: Medicare HMO

## 2018-09-26 ENCOUNTER — Encounter (HOSPITAL_COMMUNITY): Payer: Self-pay

## 2018-10-14 DIAGNOSIS — M25511 Pain in right shoulder: Secondary | ICD-10-CM | POA: Diagnosis not present

## 2018-10-18 ENCOUNTER — Other Ambulatory Visit (HOSPITAL_COMMUNITY): Payer: Self-pay | Admitting: Internal Medicine

## 2018-10-18 DIAGNOSIS — Z1231 Encounter for screening mammogram for malignant neoplasm of breast: Secondary | ICD-10-CM

## 2018-10-25 ENCOUNTER — Encounter (HOSPITAL_COMMUNITY): Payer: Self-pay

## 2018-10-25 ENCOUNTER — Ambulatory Visit (HOSPITAL_COMMUNITY)
Admission: RE | Admit: 2018-10-25 | Discharge: 2018-10-25 | Disposition: A | Discharge status: Home / Self Care | Payer: Medicare HMO | Source: Ambulatory Visit | Attending: Internal Medicine | Admitting: Internal Medicine

## 2018-10-25 DIAGNOSIS — Z1231 Encounter for screening mammogram for malignant neoplasm of breast: Secondary | ICD-10-CM | POA: Diagnosis not present

## 2018-11-20 DIAGNOSIS — E785 Hyperlipidemia, unspecified: Secondary | ICD-10-CM | POA: Diagnosis not present

## 2018-11-20 DIAGNOSIS — I10 Essential (primary) hypertension: Secondary | ICD-10-CM | POA: Diagnosis not present

## 2018-11-20 DIAGNOSIS — Z789 Other specified health status: Secondary | ICD-10-CM | POA: Diagnosis not present

## 2018-11-28 DIAGNOSIS — E785 Hyperlipidemia, unspecified: Secondary | ICD-10-CM | POA: Diagnosis not present

## 2018-11-28 DIAGNOSIS — Z79899 Other long term (current) drug therapy: Secondary | ICD-10-CM | POA: Diagnosis not present

## 2018-11-28 DIAGNOSIS — I10 Essential (primary) hypertension: Secondary | ICD-10-CM | POA: Diagnosis not present

## 2018-11-28 DIAGNOSIS — J309 Allergic rhinitis, unspecified: Secondary | ICD-10-CM | POA: Diagnosis not present

## 2018-12-02 DIAGNOSIS — R69 Illness, unspecified: Secondary | ICD-10-CM | POA: Diagnosis not present

## 2018-12-17 DIAGNOSIS — Z6835 Body mass index (BMI) 35.0-35.9, adult: Secondary | ICD-10-CM | POA: Diagnosis not present

## 2018-12-17 DIAGNOSIS — I10 Essential (primary) hypertension: Secondary | ICD-10-CM | POA: Diagnosis not present

## 2018-12-17 DIAGNOSIS — Z0489 Encounter for examination and observation for other specified reasons: Secondary | ICD-10-CM | POA: Diagnosis not present

## 2018-12-17 DIAGNOSIS — E782 Mixed hyperlipidemia: Secondary | ICD-10-CM | POA: Diagnosis not present

## 2018-12-31 DIAGNOSIS — I10 Essential (primary) hypertension: Secondary | ICD-10-CM | POA: Diagnosis not present

## 2018-12-31 DIAGNOSIS — R7301 Impaired fasting glucose: Secondary | ICD-10-CM | POA: Diagnosis not present

## 2018-12-31 DIAGNOSIS — E782 Mixed hyperlipidemia: Secondary | ICD-10-CM | POA: Diagnosis not present

## 2019-01-17 DIAGNOSIS — Z0000 Encounter for general adult medical examination without abnormal findings: Secondary | ICD-10-CM | POA: Diagnosis not present

## 2019-06-25 DIAGNOSIS — R69 Illness, unspecified: Secondary | ICD-10-CM | POA: Diagnosis not present

## 2019-06-30 DIAGNOSIS — I10 Essential (primary) hypertension: Secondary | ICD-10-CM | POA: Diagnosis not present

## 2019-06-30 DIAGNOSIS — R7301 Impaired fasting glucose: Secondary | ICD-10-CM | POA: Diagnosis not present

## 2019-06-30 DIAGNOSIS — E782 Mixed hyperlipidemia: Secondary | ICD-10-CM | POA: Diagnosis not present

## 2019-07-07 DIAGNOSIS — R7301 Impaired fasting glucose: Secondary | ICD-10-CM | POA: Diagnosis not present

## 2019-07-07 DIAGNOSIS — Z6835 Body mass index (BMI) 35.0-35.9, adult: Secondary | ICD-10-CM | POA: Diagnosis not present

## 2019-07-07 DIAGNOSIS — R7303 Prediabetes: Secondary | ICD-10-CM | POA: Diagnosis not present

## 2019-07-07 DIAGNOSIS — E782 Mixed hyperlipidemia: Secondary | ICD-10-CM | POA: Diagnosis not present

## 2019-07-07 DIAGNOSIS — Z124 Encounter for screening for malignant neoplasm of cervix: Secondary | ICD-10-CM | POA: Diagnosis not present

## 2019-07-07 DIAGNOSIS — Z0000 Encounter for general adult medical examination without abnormal findings: Secondary | ICD-10-CM | POA: Diagnosis not present

## 2019-07-07 DIAGNOSIS — Z6836 Body mass index (BMI) 36.0-36.9, adult: Secondary | ICD-10-CM | POA: Diagnosis not present

## 2019-07-07 DIAGNOSIS — I10 Essential (primary) hypertension: Secondary | ICD-10-CM | POA: Diagnosis not present

## 2019-07-07 DIAGNOSIS — R0602 Shortness of breath: Secondary | ICD-10-CM | POA: Diagnosis not present

## 2019-07-07 DIAGNOSIS — R05 Cough: Secondary | ICD-10-CM | POA: Diagnosis not present

## 2019-07-21 DIAGNOSIS — R0602 Shortness of breath: Secondary | ICD-10-CM | POA: Diagnosis not present

## 2019-07-21 DIAGNOSIS — R05 Cough: Secondary | ICD-10-CM | POA: Diagnosis not present

## 2019-09-26 DIAGNOSIS — J80 Acute respiratory distress syndrome: Secondary | ICD-10-CM

## 2019-09-26 DIAGNOSIS — U071 COVID-19: Secondary | ICD-10-CM

## 2019-09-26 HISTORY — DX: Acute respiratory distress syndrome: J80

## 2019-09-26 HISTORY — DX: COVID-19: U07.1

## 2019-09-30 DIAGNOSIS — R69 Illness, unspecified: Secondary | ICD-10-CM | POA: Diagnosis not present

## 2019-10-02 ENCOUNTER — Inpatient Hospital Stay (HOSPITAL_COMMUNITY): Payer: Medicare HMO

## 2019-10-02 ENCOUNTER — Encounter (HOSPITAL_COMMUNITY): Payer: Self-pay | Admitting: Pulmonary Disease

## 2019-10-02 ENCOUNTER — Emergency Department (HOSPITAL_COMMUNITY): Payer: Medicare HMO

## 2019-10-02 ENCOUNTER — Other Ambulatory Visit: Payer: Self-pay

## 2019-10-02 ENCOUNTER — Inpatient Hospital Stay (HOSPITAL_COMMUNITY)
Admission: EM | Admit: 2019-10-02 | Discharge: 2019-10-13 | DRG: 871 | Disposition: A | Discharge status: Home Health | Payer: Medicare HMO | Attending: Internal Medicine | Admitting: Internal Medicine

## 2019-10-02 DIAGNOSIS — Z0189 Encounter for other specified special examinations: Secondary | ICD-10-CM

## 2019-10-02 DIAGNOSIS — J189 Pneumonia, unspecified organism: Secondary | ICD-10-CM | POA: Diagnosis not present

## 2019-10-02 DIAGNOSIS — R0603 Acute respiratory distress: Secondary | ICD-10-CM | POA: Diagnosis not present

## 2019-10-02 DIAGNOSIS — A4189 Other specified sepsis: Principal | ICD-10-CM | POA: Diagnosis present

## 2019-10-02 DIAGNOSIS — J80 Acute respiratory distress syndrome: Secondary | ICD-10-CM | POA: Diagnosis not present

## 2019-10-02 DIAGNOSIS — R4182 Altered mental status, unspecified: Secondary | ICD-10-CM | POA: Diagnosis not present

## 2019-10-02 DIAGNOSIS — B955 Unspecified streptococcus as the cause of diseases classified elsewhere: Secondary | ICD-10-CM | POA: Diagnosis present

## 2019-10-02 DIAGNOSIS — J1282 Pneumonia due to coronavirus disease 2019: Secondary | ICD-10-CM | POA: Diagnosis present

## 2019-10-02 DIAGNOSIS — T380X5A Adverse effect of glucocorticoids and synthetic analogues, initial encounter: Secondary | ICD-10-CM | POA: Diagnosis present

## 2019-10-02 DIAGNOSIS — Z7982 Long term (current) use of aspirin: Secondary | ICD-10-CM

## 2019-10-02 DIAGNOSIS — R404 Transient alteration of awareness: Secondary | ICD-10-CM | POA: Diagnosis not present

## 2019-10-02 DIAGNOSIS — N390 Urinary tract infection, site not specified: Secondary | ICD-10-CM | POA: Diagnosis present

## 2019-10-02 DIAGNOSIS — R6521 Severe sepsis with septic shock: Secondary | ICD-10-CM | POA: Diagnosis not present

## 2019-10-02 DIAGNOSIS — N179 Acute kidney failure, unspecified: Secondary | ICD-10-CM | POA: Diagnosis not present

## 2019-10-02 DIAGNOSIS — G9341 Metabolic encephalopathy: Secondary | ICD-10-CM | POA: Diagnosis not present

## 2019-10-02 DIAGNOSIS — J181 Lobar pneumonia, unspecified organism: Secondary | ICD-10-CM | POA: Diagnosis not present

## 2019-10-02 DIAGNOSIS — E1165 Type 2 diabetes mellitus with hyperglycemia: Secondary | ICD-10-CM | POA: Diagnosis not present

## 2019-10-02 DIAGNOSIS — R000 Tachycardia, unspecified: Secondary | ICD-10-CM | POA: Diagnosis not present

## 2019-10-02 DIAGNOSIS — Z9911 Dependence on respirator [ventilator] status: Secondary | ICD-10-CM

## 2019-10-02 DIAGNOSIS — I10 Essential (primary) hypertension: Secondary | ICD-10-CM | POA: Diagnosis not present

## 2019-10-02 DIAGNOSIS — Z209 Contact with and (suspected) exposure to unspecified communicable disease: Secondary | ICD-10-CM | POA: Diagnosis not present

## 2019-10-02 DIAGNOSIS — J9601 Acute respiratory failure with hypoxia: Secondary | ICD-10-CM | POA: Diagnosis not present

## 2019-10-02 DIAGNOSIS — N3000 Acute cystitis without hematuria: Secondary | ICD-10-CM | POA: Diagnosis not present

## 2019-10-02 DIAGNOSIS — U071 COVID-19: Secondary | ICD-10-CM | POA: Diagnosis not present

## 2019-10-02 DIAGNOSIS — J90 Pleural effusion, not elsewhere classified: Secondary | ICD-10-CM | POA: Diagnosis not present

## 2019-10-02 DIAGNOSIS — B370 Candidal stomatitis: Secondary | ICD-10-CM

## 2019-10-02 DIAGNOSIS — I340 Nonrheumatic mitral (valve) insufficiency: Secondary | ICD-10-CM | POA: Diagnosis not present

## 2019-10-02 DIAGNOSIS — R4020 Unspecified coma: Secondary | ICD-10-CM | POA: Diagnosis not present

## 2019-10-02 DIAGNOSIS — R0602 Shortness of breath: Secondary | ICD-10-CM | POA: Diagnosis not present

## 2019-10-02 DIAGNOSIS — Z4682 Encounter for fitting and adjustment of non-vascular catheter: Secondary | ICD-10-CM | POA: Diagnosis not present

## 2019-10-02 DIAGNOSIS — Z79899 Other long term (current) drug therapy: Secondary | ICD-10-CM | POA: Diagnosis not present

## 2019-10-02 DIAGNOSIS — J9600 Acute respiratory failure, unspecified whether with hypoxia or hypercapnia: Secondary | ICD-10-CM | POA: Diagnosis not present

## 2019-10-02 DIAGNOSIS — J9611 Chronic respiratory failure with hypoxia: Secondary | ICD-10-CM | POA: Insufficient documentation

## 2019-10-02 DIAGNOSIS — IMO0002 Reserved for concepts with insufficient information to code with codable children: Secondary | ICD-10-CM

## 2019-10-02 LAB — URINALYSIS, ROUTINE W REFLEX MICROSCOPIC
Bilirubin Urine: NEGATIVE
Glucose, UA: NEGATIVE mg/dL
Hgb urine dipstick: NEGATIVE
Ketones, ur: NEGATIVE mg/dL
Nitrite: NEGATIVE
Protein, ur: 30 mg/dL — AB
Specific Gravity, Urine: 1.018 (ref 1.005–1.030)
WBC, UA: >50 WBC/hpf — ABNORMAL HIGH (ref 0–5)
pH: 5 (ref 5.0–8.0)

## 2019-10-02 LAB — PROCALCITONIN
Procalcitonin: 0.72 ng/mL
Procalcitonin: 3.69 ng/mL

## 2019-10-02 LAB — GLUCOSE, CAPILLARY: Glucose-Capillary: 222 mg/dL — ABNORMAL HIGH (ref 70–99)

## 2019-10-02 LAB — TROPONIN I (HIGH SENSITIVITY)
Troponin I (High Sensitivity): 83 ng/L — ABNORMAL HIGH (ref <18)
Troponin I (High Sensitivity): 135 ng/L (ref <18)
Troponin I (High Sensitivity): 213 ng/L (ref <18)

## 2019-10-02 LAB — CBC
HCT: 38.3 % (ref 36.0–46.0)
Hemoglobin: 12 g/dL (ref 12.0–15.0)
MCH: 28.4 pg (ref 26.0–34.0)
MCHC: 31.3 g/dL (ref 30.0–36.0)
MCV: 90.8 fL (ref 80.0–100.0)
Platelets: 326 10*3/uL (ref 150–400)
RBC: 4.22 MIL/uL (ref 3.87–5.11)
RDW: 14.6 % (ref 11.5–15.5)
WBC: 18.2 10*3/uL — ABNORMAL HIGH (ref 4.0–10.5)
nRBC: 0.3 % — ABNORMAL HIGH (ref 0.0–0.2)

## 2019-10-02 LAB — CBC WITH DIFFERENTIAL/PLATELET
Abs Immature Granulocytes: 0.91 10*3/uL — ABNORMAL HIGH (ref 0.00–0.07)
Basophils Absolute: 0.1 10*3/uL (ref 0.0–0.1)
Basophils Relative: 0 %
Eosinophils Absolute: 0 10*3/uL (ref 0.0–0.5)
Eosinophils Relative: 0 %
HCT: 46.1 % — ABNORMAL HIGH (ref 36.0–46.0)
Hemoglobin: 14 g/dL (ref 12.0–15.0)
Immature Granulocytes: 4 %
Lymphocytes Relative: 6 %
Lymphs Abs: 1.4 10*3/uL (ref 0.7–4.0)
MCH: 28.2 pg (ref 26.0–34.0)
MCHC: 30.4 g/dL (ref 30.0–36.0)
MCV: 92.9 fL (ref 80.0–100.0)
Monocytes Absolute: 1.1 10*3/uL — ABNORMAL HIGH (ref 0.1–1.0)
Monocytes Relative: 5 %
Neutro Abs: 20 10*3/uL — ABNORMAL HIGH (ref 1.7–7.7)
Neutrophils Relative %: 85 %
Platelets: 398 10*3/uL (ref 150–400)
RBC: 4.96 MIL/uL (ref 3.87–5.11)
RDW: 14.6 % (ref 11.5–15.5)
WBC: 23.5 10*3/uL — ABNORMAL HIGH (ref 4.0–10.5)
nRBC: 0.2 % (ref 0.0–0.2)

## 2019-10-02 LAB — COMPREHENSIVE METABOLIC PANEL
ALT: 71 U/L — ABNORMAL HIGH (ref 0–44)
ALT: 70 U/L — ABNORMAL HIGH (ref 0–44)
AST: 106 U/L — ABNORMAL HIGH (ref 15–41)
AST: 89 U/L — ABNORMAL HIGH (ref 15–41)
Albumin: 3.3 g/dL — ABNORMAL LOW (ref 3.5–5.0)
Albumin: 2.9 g/dL — ABNORMAL LOW (ref 3.5–5.0)
Alkaline Phosphatase: 78 U/L (ref 38–126)
Alkaline Phosphatase: 70 U/L (ref 38–126)
Anion gap: 17 — ABNORMAL HIGH (ref 5–15)
Anion gap: 14 (ref 5–15)
BUN: 40 mg/dL — ABNORMAL HIGH (ref 8–23)
BUN: 52 mg/dL — ABNORMAL HIGH (ref 8–23)
CO2: 24 mmol/L (ref 22–32)
CO2: 26 mmol/L (ref 22–32)
Calcium: 8.9 mg/dL (ref 8.9–10.3)
Calcium: 8.6 mg/dL — ABNORMAL LOW (ref 8.9–10.3)
Chloride: 97 mmol/L — ABNORMAL LOW (ref 98–111)
Chloride: 99 mmol/L (ref 98–111)
Creatinine, Ser: 1.18 mg/dL — ABNORMAL HIGH (ref 0.44–1.00)
Creatinine, Ser: 1.41 mg/dL — ABNORMAL HIGH (ref 0.44–1.00)
GFR calc Af Amer: 55 mL/min — ABNORMAL LOW (ref >60)
GFR calc Af Amer: 45 mL/min — ABNORMAL LOW (ref >60)
GFR calc non Af Amer: 48 mL/min — ABNORMAL LOW (ref >60)
GFR calc non Af Amer: 38 mL/min — ABNORMAL LOW (ref >60)
Glucose, Bld: 159 mg/dL — ABNORMAL HIGH (ref 70–99)
Glucose, Bld: 233 mg/dL — ABNORMAL HIGH (ref 70–99)
Potassium: 4.6 mmol/L (ref 3.5–5.1)
Potassium: 4.6 mmol/L (ref 3.5–5.1)
Sodium: 138 mmol/L (ref 135–145)
Sodium: 139 mmol/L (ref 135–145)
Total Bilirubin: 0.9 mg/dL (ref 0.3–1.2)
Total Bilirubin: 0.8 mg/dL (ref 0.3–1.2)
Total Protein: 8 g/dL (ref 6.5–8.1)
Total Protein: 7.1 g/dL (ref 6.5–8.1)

## 2019-10-02 LAB — BLOOD GAS, VENOUS
Acid-Base Excess: 1.8 mmol/L (ref 0.0–2.0)
Bicarbonate: 24.1 mmol/L (ref 20.0–28.0)
FIO2: 80
O2 Saturation: 81.7 %
Patient temperature: 38
pCO2, Ven: 63 mmHg — ABNORMAL HIGH (ref 44.0–60.0)
pH, Ven: 7.271 (ref 7.250–7.430)
pO2, Ven: 54.5 mmHg — ABNORMAL HIGH (ref 32.0–45.0)

## 2019-10-02 LAB — BLOOD GAS, ARTERIAL
Acid-Base Excess: 2.8 mmol/L — ABNORMAL HIGH (ref 0.0–2.0)
Bicarbonate: 26.3 mmol/L (ref 20.0–28.0)
FIO2: 100
O2 Saturation: 96 %
Patient temperature: 38.1
pCO2 arterial: 49.4 mmHg — ABNORMAL HIGH (ref 32.0–48.0)
pH, Arterial: 7.368 (ref 7.350–7.450)
pO2, Arterial: 90 mmHg (ref 83.0–108.0)

## 2019-10-02 LAB — PHOSPHORUS: Phosphorus: 3.7 mg/dL (ref 2.5–4.6)

## 2019-10-02 LAB — ABO/RH: ABO/RH(D): O POS

## 2019-10-02 LAB — LACTATE DEHYDROGENASE: LDH: 540 U/L — ABNORMAL HIGH (ref 98–192)

## 2019-10-02 LAB — D-DIMER, QUANTITATIVE
D-Dimer, Quant: 1.35 ug/mL-FEU — ABNORMAL HIGH (ref 0.00–0.50)
D-Dimer, Quant: 1.23 ug/mL-FEU — ABNORMAL HIGH (ref 0.00–0.50)

## 2019-10-02 LAB — FERRITIN: Ferritin: 1007 ng/mL — ABNORMAL HIGH (ref 11–307)

## 2019-10-02 LAB — TRIGLYCERIDES: Triglycerides: 171 mg/dL — ABNORMAL HIGH (ref <150)

## 2019-10-02 LAB — BRAIN NATRIURETIC PEPTIDE: B Natriuretic Peptide: 431 pg/mL — ABNORMAL HIGH (ref 0.0–100.0)

## 2019-10-02 LAB — RESPIRATORY PANEL BY RT PCR (FLU A&B, COVID)
Influenza A by PCR: NEGATIVE
Influenza B by PCR: NEGATIVE
SARS Coronavirus 2 by RT PCR: POSITIVE — AB

## 2019-10-02 LAB — LACTIC ACID, PLASMA
Lactic Acid, Venous: 2.6 mmol/L (ref 0.5–1.9)
Lactic Acid, Venous: 1.6 mmol/L (ref 0.5–1.9)

## 2019-10-02 LAB — FIBRINOGEN: Fibrinogen: >800 mg/dL — ABNORMAL HIGH (ref 210–475)

## 2019-10-02 LAB — MAGNESIUM: Magnesium: 2.8 mg/dL — ABNORMAL HIGH (ref 1.7–2.4)

## 2019-10-02 LAB — C-REACTIVE PROTEIN: CRP: 21.3 mg/dL — ABNORMAL HIGH (ref <1.0)

## 2019-10-02 LAB — MRSA PCR SCREENING: MRSA by PCR: NEGATIVE

## 2019-10-02 LAB — CBG MONITORING, ED: Glucose-Capillary: 169 mg/dL — ABNORMAL HIGH (ref 70–99)

## 2019-10-02 MED ORDER — PRO-STAT SUGAR FREE PO LIQD
30.0000 mL | Freq: Two times a day (BID) | ORAL | Status: DC
  Administered 2019-10-02 – 2019-10-03 (×3): 30 mL
  Filled 2019-10-02 (×2): qty 30

## 2019-10-02 MED ORDER — DOCUSATE SODIUM 50 MG/5ML PO LIQD
100.0000 mg | Freq: Two times a day (BID) | ORAL | Status: DC | PRN
  Administered 2019-10-04 – 2019-10-06 (×2): 100 mg
  Filled 2019-10-02 (×2): qty 10

## 2019-10-02 MED ORDER — DEXAMETHASONE SODIUM PHOSPHATE 10 MG/ML IJ SOLN
6.0000 mg | Freq: Once | INTRAMUSCULAR | Status: AC
  Administered 2019-10-02: 10:00:00 6 mg via INTRAVENOUS
  Filled 2019-10-02: qty 1

## 2019-10-02 MED ORDER — SODIUM CHLORIDE 0.9 % IV SOLN
500.0000 mg | Freq: Once | INTRAVENOUS | Status: AC
  Administered 2019-10-02: 500 mg via INTRAVENOUS
  Filled 2019-10-02: qty 500

## 2019-10-02 MED ORDER — CHLORHEXIDINE GLUCONATE 0.12% ORAL RINSE (MEDLINE KIT)
15.0000 mL | Freq: Two times a day (BID) | OROMUCOSAL | Status: DC
  Administered 2019-10-02 – 2019-10-03 (×2): 15 mL via OROMUCOSAL

## 2019-10-02 MED ORDER — INSULIN ASPART 100 UNIT/ML ~~LOC~~ SOLN
0.0000 [IU] | SUBCUTANEOUS | Status: DC
  Administered 2019-10-02: 23:00:00 3 [IU] via SUBCUTANEOUS
  Administered 2019-10-03 (×2): 2 [IU] via SUBCUTANEOUS
  Administered 2019-10-03 (×2): 3 [IU] via SUBCUTANEOUS
  Administered 2019-10-03: 16:00:00 2 [IU] via SUBCUTANEOUS
  Administered 2019-10-04 (×5): 5 [IU] via SUBCUTANEOUS
  Administered 2019-10-04: 20:00:00 3 [IU] via SUBCUTANEOUS
  Administered 2019-10-05 (×2): 5 [IU] via SUBCUTANEOUS
  Administered 2019-10-05: 13:00:00 3 [IU] via SUBCUTANEOUS
  Administered 2019-10-05: 5 [IU] via SUBCUTANEOUS

## 2019-10-02 MED ORDER — PANTOPRAZOLE SODIUM 40 MG IV SOLR
40.0000 mg | Freq: Every day | INTRAVENOUS | Status: DC
  Administered 2019-10-03 (×2): 40 mg via INTRAVENOUS
  Filled 2019-10-02 (×2): qty 40

## 2019-10-02 MED ORDER — FENTANYL BOLUS VIA INFUSION
25.0000 ug | INTRAVENOUS | Status: DC | PRN
  Administered 2019-10-04 – 2019-10-05 (×6): 25 ug via INTRAVENOUS
  Filled 2019-10-02: qty 25

## 2019-10-02 MED ORDER — ALBUTEROL SULFATE HFA 108 (90 BASE) MCG/ACT IN AERS
4.0000 | INHALATION_SPRAY | Freq: Once | RESPIRATORY_TRACT | Status: AC
  Administered 2019-10-02: 4 via RESPIRATORY_TRACT
  Filled 2019-10-02: qty 6.7

## 2019-10-02 MED ORDER — ENOXAPARIN SODIUM 40 MG/0.4ML ~~LOC~~ SOLN
0.5000 mg/kg | Freq: Two times a day (BID) | SUBCUTANEOUS | Status: DC
  Administered 2019-10-02 – 2019-10-11 (×18): 40 mg via SUBCUTANEOUS
  Filled 2019-10-02 (×18): qty 0.4

## 2019-10-02 MED ORDER — MIDAZOLAM HCL 2 MG/2ML IJ SOLN
2.0000 mg | Freq: Once | INTRAMUSCULAR | Status: AC
  Administered 2019-10-02: 17:00:00 2 mg via INTRAVENOUS
  Filled 2019-10-02: qty 2

## 2019-10-02 MED ORDER — FENTANYL 2500MCG IN NS 250ML (10MCG/ML) PREMIX INFUSION
25.0000 ug/h | INTRAVENOUS | Status: DC
  Administered 2019-10-02: 21:00:00 25 ug/h via INTRAVENOUS
  Administered 2019-10-04: 150 ug/h via INTRAVENOUS
  Administered 2019-10-04 – 2019-10-05 (×2): 200 ug/h via INTRAVENOUS
  Administered 2019-10-05: 125 ug/h via INTRAVENOUS
  Filled 2019-10-02 (×5): qty 250

## 2019-10-02 MED ORDER — FLUCONAZOLE IN SODIUM CHLORIDE 200-0.9 MG/100ML-% IV SOLN
200.0000 mg | Freq: Every day | INTRAVENOUS | Status: DC
  Administered 2019-10-03: 200 mg via INTRAVENOUS
  Filled 2019-10-02 (×2): qty 100

## 2019-10-02 MED ORDER — ORAL CARE MOUTH RINSE
15.0000 mL | OROMUCOSAL | Status: DC
  Administered 2019-10-02 – 2019-10-08 (×52): 15 mL via OROMUCOSAL

## 2019-10-02 MED ORDER — INFLUENZA VAC A&B SA ADJ QUAD 0.5 ML IM PRSY
0.5000 mL | PREFILLED_SYRINGE | INTRAMUSCULAR | Status: DC
  Filled 2019-10-02: qty 0.5

## 2019-10-02 MED ORDER — SODIUM CHLORIDE 0.9 % IV SOLN
100.0000 mg | Freq: Every day | INTRAVENOUS | Status: AC
  Administered 2019-10-03 – 2019-10-06 (×4): 100 mg via INTRAVENOUS
  Filled 2019-10-02 (×3): qty 20

## 2019-10-02 MED ORDER — MORPHINE 100MG IN NS 100ML (1MG/ML) PREMIX INFUSION
10.0000 mg/h | INTRAVENOUS | Status: DC
  Administered 2019-10-02: 10 mg/h via INTRAVENOUS
  Filled 2019-10-02: qty 100

## 2019-10-02 MED ORDER — VITAL HIGH PROTEIN PO LIQD
1000.0000 mL | ORAL | Status: DC
  Administered 2019-10-03: 03:00:00 1000 mL

## 2019-10-02 MED ORDER — SUCCINYLCHOLINE CHLORIDE 20 MG/ML IJ SOLN
100.0000 mg | Freq: Once | INTRAMUSCULAR | Status: AC
  Administered 2019-10-02: 100 mg via INTRAVENOUS

## 2019-10-02 MED ORDER — ETOMIDATE 2 MG/ML IV SOLN
15.0000 mg | Freq: Once | INTRAVENOUS | Status: AC
  Administered 2019-10-02: 15:00:00 15 mg via INTRAVENOUS

## 2019-10-02 MED ORDER — SODIUM CHLORIDE 0.9 % IV SOLN
1.0000 g | Freq: Once | INTRAVENOUS | Status: AC
  Administered 2019-10-02: 1 g via INTRAVENOUS
  Filled 2019-10-02: qty 10

## 2019-10-02 MED ORDER — SODIUM CHLORIDE 0.9 % IV SOLN
2.0000 g | Freq: Two times a day (BID) | INTRAVENOUS | Status: DC
  Administered 2019-10-02 – 2019-10-04 (×4): 2 g via INTRAVENOUS
  Filled 2019-10-02 (×4): qty 2

## 2019-10-02 MED ORDER — PNEUMOCOCCAL VAC POLYVALENT 25 MCG/0.5ML IJ INJ
0.5000 mL | INJECTION | INTRAMUSCULAR | Status: DC
  Filled 2019-10-02: qty 0.5

## 2019-10-02 MED ORDER — FENTANYL 2500MCG IN NS 250ML (10MCG/ML) PREMIX INFUSION: 40.0000 ug/h | INTRAVENOUS | Status: DC

## 2019-10-02 MED ORDER — DEXAMETHASONE 6 MG PO TABS
6.0000 mg | ORAL_TABLET | ORAL | Status: DC
  Administered 2019-10-03 – 2019-10-06 (×4): 6 mg via ORAL
  Filled 2019-10-02 (×6): qty 1

## 2019-10-02 MED ORDER — PHENYLEPHRINE HCL-NACL 10-0.9 MG/250ML-% IV SOLN
25.0000 ug/min | INTRAVENOUS | Status: DC
  Administered 2019-10-03: 01:00:00 25 ug/min via INTRAVENOUS
  Filled 2019-10-02: qty 250

## 2019-10-02 MED ORDER — PROPOFOL 1000 MG/100ML IV EMUL
0.0000 ug/kg/min | INTRAVENOUS | Status: DC
  Administered 2019-10-02: 19:00:00 20 ug/kg/min via INTRAVENOUS
  Administered 2019-10-03: 22:00:00 30 ug/kg/min via INTRAVENOUS
  Administered 2019-10-03 – 2019-10-04 (×2): 20 ug/kg/min via INTRAVENOUS
  Administered 2019-10-04 (×2): 30 ug/kg/min via INTRAVENOUS
  Administered 2019-10-05: 05:00:00 15 ug/kg/min via INTRAVENOUS
  Filled 2019-10-02 (×7): qty 100

## 2019-10-02 MED ORDER — PROPOFOL 1000 MG/100ML IV EMUL
5.0000 ug/kg/min | INTRAVENOUS | Status: DC
  Administered 2019-10-02: 15:00:00 5 ug/kg/min via INTRAVENOUS
  Filled 2019-10-02: qty 100

## 2019-10-02 MED ORDER — FUROSEMIDE 10 MG/ML IJ SOLN
40.0000 mg | Freq: Four times a day (QID) | INTRAMUSCULAR | Status: AC
  Administered 2019-10-03: 02:00:00 40 mg via INTRAVENOUS
  Filled 2019-10-02 (×2): qty 4

## 2019-10-02 MED ORDER — SODIUM CHLORIDE 0.9 % IV SOLN
200.0000 mg | Freq: Once | INTRAVENOUS | Status: AC
  Administered 2019-10-02: 200 mg via INTRAVENOUS
  Filled 2019-10-02: qty 40

## 2019-10-02 MED ORDER — FENTANYL CITRATE (PF) 100 MCG/2ML IJ SOLN: 25.0000 ug | Freq: Once | INTRAMUSCULAR | Status: DC

## 2019-10-02 MED ORDER — SODIUM CHLORIDE 0.9 % IV SOLN
250.0000 mL | INTRAVENOUS | Status: DC
  Administered 2019-10-02: 250 mL via INTRAVENOUS

## 2019-10-02 MED ORDER — CHLORHEXIDINE GLUCONATE CLOTH 2 % EX PADS
6.0000 | MEDICATED_PAD | Freq: Every day | CUTANEOUS | Status: DC
  Administered 2019-10-03 – 2019-10-13 (×11): 6 via TOPICAL

## 2019-10-02 NOTE — Progress Notes (Signed)
eLink Physician-Brief Progress Note Patient Name: Kristina [name] DOB: 1951-08-30 MRN: [id number]   Date of Service  10/02/2019  HPI/Events of Note  Notified of need for stress ulcer prophylaxis - Patient intubated and ventilated.   eICU Interventions  Will order Protonix IV.      Intervention Category Intermediate Interventions: Best-practice therapies (e.g. DVT, beta blocker, etc.)  Shawnise Peterkin Eugene 10/02/2019, 11:53 PM

## 2019-10-02 NOTE — Sedation Documentation (Signed)
Dr. Melina Copa intubated with size 7.5ett and secured at 20cm at lip.  Positive color change on co2 detector, breath sounds audible.

## 2019-10-02 NOTE — ED Triage Notes (Signed)
Pt from home brought in via EMS. EMS reports pt had general malaise yesterday. Pt diagnosed with covid per family unknown date. Pt opens eyes to speech, HR 132, 86% at time of arrival to ED. EMS reports respirations en route 60 per minute, CBG 199. EDP at bedside. RT paged.

## 2019-10-02 NOTE — ED Provider Notes (Signed)
[name] Provider Note   CSN: [id number] Arrival date & time: 10/02/19  [id number]     History Chief Complaint  Patient presents with  . Respiratory Distress    [name] is a 69 y.o. female.  Level 5 caveat secondary to altered mental status and respiratory distress.  69 year old female known Covid positive per family with multiple Covid family members.  Per EMS reported malaise yesterday and unresponsive to family this morning.  They found her with sats in 40 to 50% room air.  She was given subcu epi x2 albuterol and placed on a nonrebreather with improvement in her sats to the mid 80s on arrival.  Fingerstick blood sugar 199.  No other history available at this time.  The history is provided by the EMS personnel.  Shortness of Breath Severity:  Severe Onset quality:  Unable to specify Progression:  Unchanged Chronicity:  New      Past Medical History:  Diagnosis Date  . Hypertension     Patient Active Problem List   Diagnosis Date Noted  . Special screening for malignant neoplasms, colon 11/06/2016    Past Surgical History:  Procedure Laterality Date  . COLONOSCOPY N/A 12/21/2016   Procedure: COLONOSCOPY;  Surgeon: Rogene Houston, MD;  Location: [facility];  Service: Endoscopy;  Laterality: N/A;  930  . NO PAST SURGERIES       OB History   No obstetric history on file.     Family History  Problem Relation Age of Onset  . Colon cancer Neg Hx     Social History   Tobacco Use  . Smoking status: Never Smoker  . Smokeless tobacco: Never Used  Substance Use Topics  . Alcohol use: No  . Drug use: No    Home Medications Prior to Admission medications   Medication Sig Start Date End Date Taking? Authorizing Provider  amLODipine (NORVASC) 10 MG tablet Take 10 mg by mouth daily.    [provider]  aspirin EC 81 MG tablet Take 1 tablet (81 mg total) by mouth daily. 12/28/16   Rehman, Mechele Dawley, MD  hydrochlorothiazide  (HYDRODIURIL) 25 MG tablet Take 25 mg by mouth daily.    [provider]  losartan-hydrochlorothiazide (HYZAAR) 100-25 MG tablet Take 1 tablet by mouth daily.    [provider]    Allergies    Patient has no known allergies.  Review of Systems   Review of Systems  Unable to perform ROS: Patient unresponsive  Respiratory: Positive for shortness of breath.     Physical Exam Updated Vital Signs BP 104/67   Pulse 95   Temp 99.8 F (37.7 C) (Rectal)   Resp (!) 21   Ht 5\' 4"  (1.626 m)   Wt 81.6 kg   SpO2 96%   BMI 30.90 kg/m   Physical Exam Vitals and nursing note reviewed.  Constitutional:      General: She is in acute distress.     Appearance: She is well-developed.  HENT:     Head: Normocephalic and atraumatic.  Eyes:     Conjunctiva/sclera: Conjunctivae normal.     Pupils: Pupils are equal, round, and reactive to light.  Cardiovascular:     Rate and Rhythm: Regular rhythm. Tachycardia present.     Pulses: Normal pulses.     Heart sounds: No murmur.  Pulmonary:     Effort: Tachypnea and accessory muscle usage present.     Breath sounds: Wheezing and rhonchi present.  Abdominal:     Palpations: Abdomen is soft.     Tenderness: There is no abdominal tenderness.  Musculoskeletal:        General: No deformity or signs of injury. Normal range of motion.     Cervical back: Neck supple.     Right lower leg: No edema.     Left lower leg: No edema.  Skin:    General: Skin is warm and dry.     Capillary Refill: Capillary refill takes less than 2 seconds.  Neurological:     GCS: GCS eye subscore is 3. GCS verbal subscore is 1. GCS motor subscore is 4.     Comments: She is withdrawing to pain all 4 extremities.  She opens her eyes to voice but is not answering any questions.     ED Results / Procedures / Treatments   Labs (all labs ordered are listed, but only abnormal results are displayed) Labs Reviewed  RESPIRATORY PANEL BY RT PCR (FLU A&B,  COVID) - Abnormal; Notable for the following components:      Result Value   SARS Coronavirus 2 by RT PCR POSITIVE (*)    All other components within normal limits  LACTIC ACID, PLASMA - Abnormal; Notable for the following components:   Lactic Acid, Venous 2.6 (*)    All other components within normal limits  CBC WITH DIFFERENTIAL/PLATELET - Abnormal; Notable for the following components:   WBC 23.5 (*)    HCT 46.1 (*)    Neutro Abs 20.0 (*)    Monocytes Absolute 1.1 (*)    Abs Immature Granulocytes 0.91 (*)    All other components within normal limits  COMPREHENSIVE METABOLIC PANEL - Abnormal; Notable for the following components:   Chloride 97 (*)    Glucose, Bld 159 (*)    BUN 40 (*)    Creatinine, Ser 1.18 (*)    Albumin 3.3 (*)    AST 106 (*)    ALT 71 (*)    GFR calc non Af Amer 48 (*)    GFR calc Af Amer 55 (*)    Anion gap 17 (*)    All other components within normal limits  D-DIMER, QUANTITATIVE (NOT AT [facility]) - Abnormal; Notable for the following components:   D-Dimer, Quant 1.35 (*)    All other components within normal limits  LACTATE DEHYDROGENASE - Abnormal; Notable for the following components:   LDH 540 (*)    All other components within normal limits  FERRITIN - Abnormal; Notable for the following components:   Ferritin 1,007 (*)    All other components within normal limits  TRIGLYCERIDES - Abnormal; Notable for the following components:   Triglycerides 171 (*)    All other components within normal limits  FIBRINOGEN - Abnormal; Notable for the following components:   Fibrinogen >800 (*)    All other components within normal limits  C-REACTIVE PROTEIN - Abnormal; Notable for the following components:   CRP 21.3 (*)    All other components within normal limits  BRAIN NATRIURETIC PEPTIDE - Abnormal; Notable for the following components:   B Natriuretic Peptide 431.0 (*)    All other components within normal limits  URINALYSIS, ROUTINE W REFLEX MICROSCOPIC  - Abnormal; Notable for the following components:   APPearance CLOUDY (*)    Protein, ur 30 (*)    Leukocytes,Ua LARGE (*)    WBC, UA >50 (*)    Bacteria, UA RARE (*)    All other components within  normal limits  BLOOD GAS, VENOUS - Abnormal; Notable for the following components:   pCO2, Ven 63.0 (*)    pO2, Ven 54.5 (*)    All other components within normal limits  BLOOD GAS, ARTERIAL - Abnormal; Notable for the following components:   pCO2 arterial 49.4 (*)    Acid-Base Excess 2.8 (*)    Allens test (pass/fail) BRACHIAL ARTERY (*)    All other components within normal limits  CBG MONITORING, ED - Abnormal; Notable for the following components:   Glucose-Capillary 169 (*)    All other components within normal limits  TROPONIN I (HIGH SENSITIVITY) - Abnormal; Notable for the following components:   Troponin I (High Sensitivity) 83 (*)    All other components within normal limits  TROPONIN I (HIGH SENSITIVITY) - Abnormal; Notable for the following components:   Troponin I (High Sensitivity) 135 (*)    All other components within normal limits  CULTURE, BLOOD (ROUTINE X 2)  CULTURE, BLOOD (ROUTINE X 2)  URINE CULTURE  LACTIC ACID, PLASMA  PROCALCITONIN  POC SARS CORONAVIRUS 2 AG -  ED    EKG EKG Interpretation  Date/Time:  Thursday October 02 2019 09:47:44 EST Ventricular Rate:  130 PR Interval:    QRS Duration: 94 QT Interval:  294 QTC Calculation: 433 R Axis:   91 Text Interpretation: Sinus tachycardia Right axis deviation No old tracing to compare Confirmed by Aletta Edouard [phone]) on 10/02/2019 9:51:22 AM   Radiology DG Chest Port 1 View  Result Date: 10/02/2019 CLINICAL DATA:  69 year old female status post intubation. Generalized malaise. COVID-19 infection. EXAM: PORTABLE CHEST 1 VIEW COMPARISON:  Chest x-ray 10/02/2019. FINDINGS: An endotracheal tube is in place with tip 3.1 cm above the carina. A nasogastric tube is seen extending into the stomach, however, the  tip of the nasogastric tube extends below the lower margin of the image. Lung volumes are low. Patchy multifocal interstitial and airspace opacities throughout the lungs bilaterally, asymmetrically distributed, with relative sparing in the apex of the left upper lobe and in the lateral aspect of the right lung base. Possible small left pleural effusion. No evidence of pulmonary edema. Heart size is borderline enlarged. Upper mediastinal contours are within normal limits allowing for patient positioning. Aortic atherosclerosis. IMPRESSION: 1. Support apparatus, as above. 2. Severe multilobar bilateral pneumonia with small left pleural effusion, as above. 3. Aortic atherosclerosis. Electronically Signed   By: Vinnie Langton M.D.   On: 10/02/2019 15:40   DG Chest Port 1 View  Result Date: 10/02/2019 CLINICAL DATA:  Shortness of breath EXAM: PORTABLE CHEST 1 VIEW COMPARISON:  None FINDINGS: Low lung volumes reflecting shallow inspiration. There are bilateral pulmonary opacities. Probable small left pleural effusion. No pneumothorax. Cardiomediastinal contours are likely within normal limits for portable technique. IMPRESSION: Bilateral pulmonary opacities suspicious for pneumonia. Probable small left pleural effusion. Electronically Signed   By: Macy Mis M.D.   On: 10/02/2019 10:33    Procedures .Critical Care Performed by: Hayden Rasmussen, MD Authorized by: Hayden Rasmussen, MD   Critical care provider statement:    Critical care time (minutes):  60   Critical care time was exclusive of:  Separately billable procedures and treating other patients   Critical care was necessary to treat or prevent imminent or life-threatening deterioration of the following conditions:  Respiratory failure   Critical care was time spent personally by me on the following activities:  Discussions with consultants, evaluation of patient's response to treatment, examination of patient,  ordering and performing  treatments and interventions, ordering and review of laboratory studies, ordering and review of radiographic studies, pulse oximetry, re-evaluation of patient's condition, obtaining history from patient or surrogate, review of old charts, development of treatment plan with patient or surrogate and ventilator management   I assumed direction of critical care for this patient from another provider in my specialty: no   Procedure Name: Intubation Date/Time: 10/02/2019 3:05 PM Performed by: Hayden Rasmussen, MD Pre-anesthesia Checklist: Patient identified, Patient being monitored, Emergency Drugs available, Timeout performed and Suction available Oxygen Delivery Method: Non-rebreather mask Preoxygenation: Pre-oxygenation with 100% oxygen Induction Type: Rapid sequence Ventilation: Mask ventilation without difficulty Laryngoscope Size: Glidescope and 3 Grade View: Grade II Tube type: Subglottic suction tube Tube size: 7.5 mm Number of attempts: 1 Placement Confirmation: ETT inserted through vocal cords under direct vision,  CO2 detector and Breath sounds checked- equal and bilateral Secured at: 20 cm Tube secured with: ETT holder Dental Injury: Teeth and Oropharynx as per pre-operative assessment     OG placement  Date/Time: 10/02/2019 5:34 PM Performed by: Hayden Rasmussen, MD Authorized by: Hayden Rasmussen, MD  Consent: The procedure was performed in an emergent situation. Patient tolerance: patient tolerated the procedure well with no immediate complications    (including critical care time)  Medications Ordered in ED Medications  propofol (DIPRIVAN) 1000 MG/100ML infusion (15 mcg/kg/min  81.6 kg Intravenous Rate/Dose Change 10/02/19 1531)  morphine 100mg  in NS 118mL (1mg /mL) infusion - premix (10 mg/hr Intravenous New Bag/Given 10/02/19 1703)  dexamethasone (DECADRON) injection 6 mg (6 mg Intravenous Given 10/02/19 1011)  albuterol (VENTOLIN HFA) 108 (90 Base) MCG/ACT inhaler 4 puff  (4 puffs Inhalation Given 10/02/19 1119)  cefTRIAXone (ROCEPHIN) 1 g in sodium chloride 0.9 % 100 mL IVPB (0 g Intravenous Stopped 10/02/19 1147)  azithromycin (ZITHROMAX) 500 mg in sodium chloride 0.9 % 250 mL IVPB (0 mg Intravenous Stopped 10/02/19 1257)  etomidate (AMIDATE) injection 15 mg (15 mg Intravenous Given 10/02/19 1457)  succinylcholine (ANECTINE) injection 100 mg (100 mg Intravenous Given 10/02/19 1457)  midazolam (VERSED) injection 2 mg (2 mg Intravenous Given 10/02/19 1657)    ED Course  I have reviewed the triage vital signs and the nursing notes.  Pertinent labs & imaging results that were available during my care of the patient were reviewed by me and considered in my medical decision making (see chart for details).  Clinical Course as of Oct 02 1731  Thu Jan 07, 561  3458 69 year old female history of hypertension known to be Covid positive although unclear when symptoms started.  Here after being found unresponsive and hypoxic by family.  Differential includes Covid pneumonia, hypercapnia, hypoxia, pneumonia, respiratory failure, metabolic derangement   [MB]  1033 Chest x-ray interpreted by me as multifocal pneumonia.  Likely related to Covid but will start empiric antibiotics.  We will also ordered her IV Decadron.   [MB]  1037 Patient's lactate elevated at 2.6.  I have activated sepsis protocol although will hold off on fluid resuscitation as this is likely all due to Covid.  Will order antibiotics for pneumonia.   [MB]  1101 Urine showing greater than 50 whites.  Have already ordered her ceftriaxone and Zithromax for pneumonia so this should cover the.  White count of 23.5 abnormal.  D-dimer elevated.  BNP elevated.   [MB]  1110 Reevaluated patient.  She still on a nonrebreather tachypneic satting between 88 and 90%.  She will open her eyes to voice now.   [  MB]  1114 Discussed with the patient's daughter Kristina [name].  She says she and the patient's husband would be the decision  makers.  They said she would want everything done including going on a breathing machine if it was needed.  She said the patient began with cough and shortness of breath about 5 days ago.   [MB]  [id number] Discussed with Dr. Wynetta Emery [facility] hospitalist.  He said he will need to know definitively if the patient is Covid positive because if she is and requiring this much oxygen she would need to be transported to [name] as opposed to being admitted here.  We did confirm with [facility] that she could travel on a nonrebreather to [facility] but not high flow nasal cannula or BiPAP.   [MB]  1203 Patient's sats now stay more consistently 85-88 so we will try prone.   [MB]  [place] patient but she desatted to 80 and was more restless.  She is back supine and sats still no better than 83%.  Placed a call to ICU consultant at [facility].  Awaiting call back.   [MB]  [id number] Discussed with Dr. Lake Bells critical care at [name].  He recommends intubating the patient if she has altered mental status.   [MB]  1500 Patient intubated with succinylcholine and etomidate for continued hypoxia along with her hypercapnia.  Her mental status was somewhat improved from her initial presentation but she was still not able to communicate with [place].  OG tube was also placed.  I called the patient's daughter to update her but was connected with her voicemail.   [MB]  55 Was able to reach daughter about 315pm and updated on status. She understands will be admitted to [facility] and guarded condition.    [MB]    Clinical Course User Index [MB] Hayden Rasmussen, MD   MDM Rules/Calculators/A&P                     ALINNA BURKHOLDER was evaluated in Emergency Department on 10/02/2019 for the symptoms described in the history of present illness. She was evaluated in the context of the global COVID-19 pandemic, which necessitated consideration that the patient might be at risk for infection with the SARS-CoV-2 virus that  causes COVID-19. Institutional protocols and algorithms that pertain to the evaluation of patients at risk for COVID-19 are in a state of rapid change based on information released by regulatory bodies including the CDC and federal and state organizations. These policies and algorithms were followed during the patient's care in the ED.   Final Clinical Impression(s) / ED Diagnoses Final diagnoses:  Acute respiratory failure with hypoxia ([place])  COVID-19 virus infection  Multifocal pneumonia  Altered mental status, unspecified altered mental status type    Rx / DC Orders ED Discharge Orders    None       Hayden Rasmussen, MD 10/02/19 1735

## 2019-10-02 NOTE — ED Notes (Signed)
Paged Dr. Octavio Graves at [facility] for Dr. Melina Copa.

## 2019-10-02 NOTE — H&P (Signed)
NAME:  K[facility]ina [name], MRN:  [id number], DOB:  14-Aug-1951, LOS: 0 ADMISSION DATE:  10/02/2019, CONSULTATION DATE:  10/02/2019 REFERRING MD:  Melina Copa, CHIEF COMPLAINT:  dyspnea   Brief History   69 year old female admitted on January 7 due to ARDS from Covid pneumonia requiring intubation and mechanical ventilation at [facility].  History of present illness   This is a 69 year old female who came to [facility] today by EMS in the setting of being found minimally responsive and profoundly hypoxemic.  She was brought in by EMS, given breathing treatments by EMS, blood sugar was noted to be normal.  She was very lethargic.  O2 saturation was in the 80s.  Based on her poor mental status and hypoxemia she required intubation.  She was known to have Covid.  She was transferred to our facility for further evaluation.  Past Medical History  Hypertension  Significant Hospital Events   1/7 admission  Consults:  PCCM  Procedures:  1/7 ETT >   Significant Diagnostic Tests:    Micro Data:  1/7 Res viral panel> pos SARS COV 2, neg Flu  Antimicrobials:  1/7 remdesivir 1/7 decadron   Interim history/subjective:    Objective   Blood pressure 104/67, pulse 95, temperature 99.7 F (37.6 C), temperature source Oral, resp. rate (!) 21, height 5\' 4"  (1.626 m), weight 81.6 kg, SpO2 95 %.    Vent Mode: PRVC FiO2 (%):  [100 %] 100 % Set Rate:  [20 bmp] 20 bmp Vt Set:  [440 mL] 440 mL PEEP:  [10 cmH20] 10 cmH20 Plateau Pressure:  [24 cmH20-27 cmH20] 27 cmH20   Intake/Output Summary (Last 24 hours) at 10/02/2019 1855 Last data filed at 10/02/2019 1024 Gross per 24 hour  Intake --  Output 800 ml  Net -800 ml   Filed Weights   10/02/19 0951  Weight: 81.6 kg    Examination:  General:  In bed on vent HENT: NCAT ETT in place PULM: CTA B, vent supported breathing CV: RRR, no mgr GI: BS+, soft, nontender MSK: normal bulk and tone Neuro: sedated on vent  January 7 chest  x-ray images independently reviewed showing severe bibasilar airspace disease, ET tube in place, images personally reviewed  Resolved Hospital Problem list    Assessment & Plan:  ARDS due to COVID-19 pneumonia: Admit to ICU Continue mechanical ventilation per ARDS protocol Target TVol 6-8cc/kgIBW Target Plateau Pressure < 30cm H20 Target driving pressure less than 15 cm of water Target PaO2 55-65: titrate PEEP/FiO2 per protocol As long as PaO2 to FiO2 ratio is less than 1:150 position in prone position for 16 hours a day Check CVP daily if CVL in place Target CVP less than 4, diurese as necessary Ventilator associated pneumonia prevention protocol 1/7 plan start Decadron, repeat ABG, start remdesivir, check LFTs, check hepatitis panel, consider using barcitinib  Need for sedation for mechanical ventilation Start PAD protcol RASS target -2 to -3 Fentanyl, propofol infusion  Hypertension: Monitor hemodynamics  Elevated BNP, troponin: uncertain if systolic or dystolic CHF, acute pulmonary edema, Myocarditis? Check echo Lasix overnight Check 12 lead  Best practice:  Diet: Start tube feeding Pain/Anxiety/Delirium protocol (if indicated): Yes, RASS target -2 to -3 VAP protocol (if indicated): Yes DVT prophylaxis: lovenox -0.5mg /kg bid GI prophylaxis: famotidine Glucose control: SSI Mobility: bed rest Code Status: full Family Communication: I updated her daughter Juliann Pulse Disposition: remain in ICU  Labs   CBC: Recent Labs  Lab 10/02/19 1008  WBC 23.5*  NEUTROABS  20.0*  HGB 14.0  HCT 46.1*  MCV 92.9  PLT [id number]    Basic Metabolic Panel: Recent Labs  Lab 10/02/19 1008  NA 138  K 4.6  CL 97*  CO2 24  GLUCOSE 159*  BUN 40*  CREATININE 1.18*  CALCIUM 8.9   GFR: Estimated Creatinine Clearance: 47.8 mL/min (A) (by C-G formula based on SCr of 1.18 mg/dL (H)). Recent Labs  Lab 10/02/19 1007 10/02/19 1008 10/02/19 1130 10/02/19 1224  PROCALCITON  --   --  0.72   --   WBC  --  23.5*  --   --   LATICACIDVEN 2.6*  --   --  1.6    Liver Function Tests: Recent Labs  Lab 10/02/19 1008  AST 106*  ALT 71*  ALKPHOS 78  BILITOT 0.9  PROT 8.0  ALBUMIN 3.3*   No results for input(s): LIPASE, AMYLASE in the last 168 hours. No results for input(s): AMMONIA in the last 168 hours.  ABG    Component Value Date/Time   PHART 7.368 10/02/2019 1618   PCO2ART 49.4 (H) 10/02/2019 1618   PO2ART 90.0 10/02/2019 1618   HCO3 26.3 10/02/2019 1618   O2SAT 96.0 10/02/2019 1618     Coagulation Profile: No results for input(s): INR, PROTIME in the last 168 hours.  Cardiac Enzymes: No results for input(s): CKTOTAL, CKMB, CKMBINDEX, TROPONINI in the last 168 hours.  HbA1C: No results found for: HGBA1C  CBG: Recent Labs  Lab 10/02/19 0953  GLUCAP 169*    Review of Systems:   Cannot obtain due to intubation  Past Medical History  She,  has a past medical history of Hypertension.   Surgical History    Past Surgical History:  Procedure Laterality Date  . COLONOSCOPY N/A 12/21/2016   Procedure: COLONOSCOPY;  Surgeon: Rogene Houston, MD;  Location: [facility];  Service: Endoscopy;  Laterality: N/A;  930  . NO PAST SURGERIES       Social History   reports that she has never smoked. She has never used smokeless tobacco. She reports that she does not drink alcohol or use drugs.   Family History   Her family history is negative for Colon cancer.   Allergies No Known Allergies   Home Medications  Prior to Admission medications   Medication Sig Start Date End Date Taking? Authorizing Provider  albuterol (PROVENTIL) (2.5 MG/3ML) 0.083% nebulizer solution Take 2.5 mg by nebulization every 6 (six) hours. 09/30/19  Yes [provider]  azithromycin (ZITHROMAX) 250 MG tablet Take 1 tablet by mouth as directed. 2 tabs po on day 1 (09/30/19), 1 tab po daily thereafter x4 days. 09/30/19  Yes [provider]  omeprazole (PRILOSEC) 40 MG  capsule Take 1 capsule by mouth daily. 07/24/19  Yes [provider]  ondansetron (ZOFRAN) 4 MG tablet Take 4 mg by mouth every 6 (six) hours as needed. 09/30/19  Yes [provider]  predniSONE (DELTASONE) 10 MG tablet Take 1 tablet by mouth as directed. 6 tabs on day 1(09/29/18), 5 tabs on day 2, 4 tabs on day 3, 3 tabs on day 4, 2 tabs on day 5, 1 tab on day 6. 09/30/19  Yes [provider]  amLODipine (NORVASC) 10 MG tablet Take 10 mg by mouth daily.    [provider]  aspirin EC 81 MG tablet Take 1 tablet (81 mg total) by mouth daily. 12/28/16   Rogene Houston, MD  hydrochlorothiazide (HYDRODIURIL) 25 MG tablet Take 25  mg by mouth daily.    [provider]  losartan-hydrochlorothiazide (HYZAAR) 100-25 MG tablet Take 1 tablet by mouth daily.    [provider]     Critical care time: 35 minutes     Roselie Awkward, MD [place] [facility] Pager: [phone] Cell: [phone] If no response, call [phone]

## 2019-10-02 NOTE — ED Notes (Signed)
Pt is starting to desat. Have tried proning with no success. Per Dr. Melina Copa, we will intubate patient. Have paged respiratory.

## 2019-10-02 NOTE — Progress Notes (Signed)
[facility] Physician-Brief Progress Note Patient Name: Kristina [name] DOB: 1951-05-27 MRN: [id number]   Date of Service  10/02/2019  HPI/Events of Note  Multiple issues: 1. Hypotension - BP = 85/64 with MAP = 72, Hyperglycemia - Blood glucose = 233 and Cystitis - UA with WBC > 50, Budding yeast, Leuk +, rate bacteria.   eICU Interventions  Will order: 1. Phenylephrine IV infusion via peripheral IV. Titrate to MAP >= 65. 2.  Cefepime and Diflucan per pharmacy consult. 3. Q 4 hour sensitive Novolog SSI.     Intervention Category Major Interventions: Hyperglycemia - active titration of insulin therapy;Hypotension - evaluation and management  Lysle Dingwall 10/02/2019, 10:19 PM

## 2019-10-02 NOTE — ED Notes (Signed)
[facility] here to transport patient. 

## 2019-10-02 NOTE — ED Notes (Signed)
Lab called Lactic Acid 2.6 to [place].  Dr. Melina Copa informed.

## 2019-10-02 NOTE — Progress Notes (Signed)
Pharmacy Antibiotic Note  Kristina [name] is a 69 y.o. female admitted on 10/02/2019 with COVID 19.  Pt now with hypotension and cystitis. Pharmacy has been consulted for Cefepime and Diflucan dosing for UTI. WBC down to 18.2, PCT 3.69. Tm 100.5.  Plan: Cefepime 2gm IV q12h Diflucan 200mg  IV q24h Will f/u renal function, micro data, and pt's clinical condition    Height: 5\' 4"  (162.6 cm) Weight: 180 lb (81.6 kg) IBW/kg (Calculated) : 54.7  Temp (24hrs), Avg:99.5 F (37.5 C), Min:98.1 F (36.7 C), Max:100.5 F (38.1 C)  Recent Labs  Lab 10/02/19 1007 10/02/19 1008 10/02/19 1224 10/02/19 2008  WBC  --  23.5*  --  18.2*  CREATININE  --  1.18*  --  1.41*  LATICACIDVEN 2.6*  --  1.6  --     Estimated Creatinine Clearance: 40 mL/min (A) (by C-G formula based on SCr of 1.41 mg/dL (H)).    No Known Allergies  Antimicrobials this admission: 1/7 Cefepime >>  1/7 Diflucan >>  1/7 Remdesivir >> 1/11 1/7 Azith x 1 1/7 Rocephin x1  Microbiology results: 1/7 BCx:  1/7 UCx:   1/7 MRSA PCR: negative  Thank you for allowing pharmacy to be a part of this patient's care.  Franky Macho 10/02/2019 10:29 PM

## 2019-10-02 NOTE — ED Notes (Signed)
CRITICAL VALUE ALERT  Critical Value:  Lactic 2.6 , COVID +, Trop 135  Date & Time Notied:  10/01/2018 [id number]  Provider Notified: Dr. Melina Copa   Orders Received/Actions taken: Intubate

## 2019-10-02 NOTE — ED Notes (Signed)
Vent settings; PEEP 10. Respirations 20

## 2019-10-02 NOTE — Progress Notes (Signed)
Spoke with E-link updated MD with critical troponin, elevated blood sugars, low BP, Increase in procalcatonin. New orders received.,

## 2019-10-03 ENCOUNTER — Encounter (HOSPITAL_COMMUNITY): Payer: Self-pay | Admitting: Pulmonary Disease

## 2019-10-03 ENCOUNTER — Inpatient Hospital Stay: Payer: Self-pay

## 2019-10-03 ENCOUNTER — Inpatient Hospital Stay (HOSPITAL_COMMUNITY): Payer: Medicare HMO

## 2019-10-03 DIAGNOSIS — I340 Nonrheumatic mitral (valve) insufficiency: Secondary | ICD-10-CM

## 2019-10-03 LAB — ECHOCARDIOGRAM COMPLETE
Height: 64 in
Weight: 2744.29 oz

## 2019-10-03 LAB — COMPREHENSIVE METABOLIC PANEL
ALT: 65 U/L — ABNORMAL HIGH (ref 0–44)
AST: 58 U/L — ABNORMAL HIGH (ref 15–41)
Albumin: 2.9 g/dL — ABNORMAL LOW (ref 3.5–5.0)
Alkaline Phosphatase: 75 U/L (ref 38–126)
Anion gap: 16 — ABNORMAL HIGH (ref 5–15)
BUN: 62 mg/dL — ABNORMAL HIGH (ref 8–23)
CO2: 23 mmol/L (ref 22–32)
Calcium: 8.4 mg/dL — ABNORMAL LOW (ref 8.9–10.3)
Chloride: 101 mmol/L (ref 98–111)
Creatinine, Ser: 1.49 mg/dL — ABNORMAL HIGH (ref 0.44–1.00)
GFR calc Af Amer: 42 mL/min — ABNORMAL LOW (ref >60)
GFR calc non Af Amer: 36 mL/min — ABNORMAL LOW (ref >60)
Glucose, Bld: 235 mg/dL — ABNORMAL HIGH (ref 70–99)
Potassium: 4.2 mmol/L (ref 3.5–5.1)
Sodium: 140 mmol/L (ref 135–145)
Total Bilirubin: 0.4 mg/dL (ref 0.3–1.2)
Total Protein: 6.9 g/dL (ref 6.5–8.1)

## 2019-10-03 LAB — CBC WITH DIFFERENTIAL/PLATELET
Abs Immature Granulocytes: 0.43 10*3/uL — ABNORMAL HIGH (ref 0.00–0.07)
Basophils Absolute: 0 10*3/uL (ref 0.0–0.1)
Basophils Relative: 0 %
Eosinophils Absolute: 0 10*3/uL (ref 0.0–0.5)
Eosinophils Relative: 0 %
HCT: 38.9 % (ref 36.0–46.0)
Hemoglobin: 12.1 g/dL (ref 12.0–15.0)
Immature Granulocytes: 3 %
Lymphocytes Relative: 6 %
Lymphs Abs: 1.1 10*3/uL (ref 0.7–4.0)
MCH: 28 pg (ref 26.0–34.0)
MCHC: 31.1 g/dL (ref 30.0–36.0)
MCV: 90 fL (ref 80.0–100.0)
Monocytes Absolute: 0.8 10*3/uL (ref 0.1–1.0)
Monocytes Relative: 5 %
Neutro Abs: 14.3 10*3/uL — ABNORMAL HIGH (ref 1.7–7.7)
Neutrophils Relative %: 86 %
Platelets: 403 10*3/uL — ABNORMAL HIGH (ref 150–400)
RBC: 4.32 MIL/uL (ref 3.87–5.11)
RDW: 14.4 % (ref 11.5–15.5)
WBC: 16.7 10*3/uL — ABNORMAL HIGH (ref 4.0–10.5)
nRBC: 0.2 % (ref 0.0–0.2)

## 2019-10-03 LAB — URINE CULTURE
Culture: 70000 — AB
Special Requests: NORMAL

## 2019-10-03 LAB — HEMOGLOBIN A1C
Hgb A1c MFr Bld: 6.5 % — ABNORMAL HIGH (ref 4.8–5.6)
Mean Plasma Glucose: 139.85 mg/dL

## 2019-10-03 LAB — POCT I-STAT 7, (LYTES, BLD GAS, ICA,H+H)
Acid-Base Excess: 3 mmol/L — ABNORMAL HIGH (ref 0.0–2.0)
Bicarbonate: 28 mmol/L (ref 20.0–28.0)
Calcium, Ion: 1.2 mmol/L (ref 1.15–1.40)
HCT: 35 % — ABNORMAL LOW (ref 36.0–46.0)
Hemoglobin: 11.9 g/dL — ABNORMAL LOW (ref 12.0–15.0)
O2 Saturation: 93 %
Patient temperature: 98
Potassium: 3.8 mmol/L (ref 3.5–5.1)
Sodium: 142 mmol/L (ref 135–145)
TCO2: 29 mmol/L (ref 22–32)
pCO2 arterial: 41.4 mmHg (ref 32.0–48.0)
pH, Arterial: 7.437 (ref 7.350–7.450)
pO2, Arterial: 65 mmHg — ABNORMAL LOW (ref 83.0–108.0)

## 2019-10-03 LAB — C-REACTIVE PROTEIN: CRP: 23.5 mg/dL — ABNORMAL HIGH (ref <1.0)

## 2019-10-03 LAB — TRIGLYCERIDES: Triglycerides: 284 mg/dL — ABNORMAL HIGH (ref <150)

## 2019-10-03 LAB — HIV ANTIBODY (ROUTINE TESTING W REFLEX): HIV Screen 4th Generation wRfx: NONREACTIVE

## 2019-10-03 LAB — MAGNESIUM
Magnesium: 2.7 mg/dL — ABNORMAL HIGH (ref 1.7–2.4)
Magnesium: 3 mg/dL — ABNORMAL HIGH (ref 1.7–2.4)

## 2019-10-03 LAB — GLUCOSE, CAPILLARY
Glucose-Capillary: 230 mg/dL — ABNORMAL HIGH (ref 70–99)
Glucose-Capillary: 183 mg/dL — ABNORMAL HIGH (ref 70–99)
Glucose-Capillary: 200 mg/dL — ABNORMAL HIGH (ref 70–99)

## 2019-10-03 LAB — HEPATITIS B SURFACE ANTIGEN: Hepatitis B Surface Ag: NONREACTIVE

## 2019-10-03 LAB — PHOSPHORUS
Phosphorus: 3.4 mg/dL (ref 2.5–4.6)
Phosphorus: 4 mg/dL (ref 2.5–4.6)

## 2019-10-03 LAB — D-DIMER, QUANTITATIVE: D-Dimer, Quant: 2.94 ug/mL-FEU — ABNORMAL HIGH (ref 0.00–0.50)

## 2019-10-03 LAB — FERRITIN: Ferritin: 606 ng/mL — ABNORMAL HIGH (ref 11–307)

## 2019-10-03 MED ORDER — FREE WATER
200.0000 mL | Freq: Four times a day (QID) | Status: DC
  Administered 2019-10-03 – 2019-10-06 (×12): 200 mL

## 2019-10-03 MED ORDER — INFLUENZA VAC A&B SA ADJ QUAD 0.5 ML IM PRSY
0.5000 mL | PREFILLED_SYRINGE | INTRAMUSCULAR | Status: DC
  Filled 2019-10-03: qty 0.5

## 2019-10-03 MED ORDER — PRO-STAT SUGAR FREE PO LIQD
30.0000 mL | Freq: Four times a day (QID) | ORAL | Status: DC
  Administered 2019-10-03 – 2019-10-06 (×13): 30 mL
  Filled 2019-10-03 (×10): qty 30

## 2019-10-03 MED ORDER — BARICITINIB 2 MG PO TABS
4.0000 mg | ORAL_TABLET | Freq: Every day | ORAL | Status: DC
  Administered 2019-10-03 – 2019-10-13 (×10): 4 mg via ORAL
  Filled 2019-10-03 (×11): qty 2

## 2019-10-03 MED ORDER — VITAL AF 1.2 CAL PO LIQD
1000.0000 mL | ORAL | Status: DC
  Administered 2019-10-03 – 2019-10-05 (×2): 1000 mL

## 2019-10-03 MED ORDER — CHLORHEXIDINE GLUCONATE 0.12 % MT SOLN
15.0000 mL | Freq: Two times a day (BID) | OROMUCOSAL | Status: DC
  Administered 2019-10-03 – 2019-10-09 (×12): 15 mL via OROMUCOSAL
  Filled 2019-10-03 (×8): qty 15

## 2019-10-03 MED ORDER — PNEUMOCOCCAL VAC POLYVALENT 25 MCG/0.5ML IJ INJ
0.5000 mL | INJECTION | INTRAMUSCULAR | Status: DC | PRN
  Filled 2019-10-03: qty 0.5

## 2019-10-03 MED ORDER — SODIUM CHLORIDE 0.9% FLUSH: 10.0000 mL | INTRAVENOUS | Status: DC | PRN

## 2019-10-03 MED ORDER — SODIUM CHLORIDE 0.9% FLUSH
10.0000 mL | Freq: Two times a day (BID) | INTRAVENOUS | Status: DC
  Administered 2019-10-03 – 2019-10-13 (×21): 10 mL

## 2019-10-03 NOTE — Progress Notes (Signed)
Pharmacy Communication Note:  Pharmacy consulted to start Baricitinib for COVID-19 treatment. Patient has not had any other IL-6 agents. Pt does have an elevated WBC and PCT but given AKI, this could be unreliable. Cultures remain negative to date   Start baricitinib 4 mg daily x 14 days Monitor cultures and clinical progress with antibiotics with low threshold to d/c for now.   Albertina Parr, PharmD., BCPS Clinical Pharmacist

## 2019-10-03 NOTE — Progress Notes (Signed)
NAME:  Kristina [name], MRN:  [id number], DOB:  08/10/1951, LOS: 1 ADMISSION DATE:  10/02/2019, CONSULTATION DATE:  10/02/2019 REFERRING MD:  Melina Copa, CHIEF COMPLAINT:  dyspnea   Brief History   69 year old female admitted on January 7 due to ARDS from Covid pneumonia requiring intubation and mechanical ventilation at [facility].  History of present illness   This is a 69 year old female who came to [facility] today by EMS in the setting of being found minimally responsive and profoundly hypoxemic.  She was brought in by EMS, given breathing treatments by EMS, blood sugar was noted to be normal.  She was very lethargic.  O2 saturation was in the 80s.  Based on her poor mental status and hypoxemia she required intubation.  She was known to have Covid.  She was transferred to our facility for further evaluation.  Past Medical History  Hypertension  Significant Hospital Events   1/7 admission  Consults:  PCCM  Procedures:  1/7 ETT >   Significant Diagnostic Tests:  1/8 Echo >>>  Micro Data:  1/7 Res viral panel> pos SARS COV 2, neg Flu  Antimicrobials:  1/7 remdesivir 1/7 decadron   Interim history/subjective:  As above  Objective   Blood pressure 119/78, pulse 67, temperature 98.7 F (37.1 C), temperature source Oral, resp. rate 20, height 5\' 4"  (1.626 m), weight 77.8 kg, SpO2 95 %.    Vent Mode: PRVC FiO2 (%):  [70 %-100 %] 70 % Set Rate:  [20 bmp] 20 bmp Vt Set:  [440 mL] 440 mL PEEP:  [10 cmH20] 10 cmH20 Plateau Pressure:  [24 cmH20-27 cmH20] 26 cmH20   Intake/Output Summary (Last 24 hours) at 10/03/2019 0815 Last data filed at 10/03/2019 0800 Gross per 24 hour  Intake 1107.79 ml  Output 1025 ml  Net 82.79 ml   Filed Weights   10/02/19 0951 10/03/19 0600  Weight: 81.6 kg 77.8 kg    Examination:  General:  In bed on vent HENT: NCAT ETT in place PULM: Crackles bases B, vent supported breathing CV: RRR, no mgr GI: BS+, soft, nontender MSK:  normal bulk and tone Neuro: sedated on vent   January 7 chest x-ray images independently reviewed showing severe bibasilar airspace disease, ET tube in place, images personally reviewed  Resolved Hospital Problem list    Assessment & Plan:  ARDS due to COVID-19 pneumonia: Continue mechanical ventilation per ARDS protocol Target TVol 6-8cc/kgIBW Target Plateau Pressure < 30cm H20 Target driving pressure less than 15 cm of water Target PaO2 55-65: titrate PEEP/FiO2 per protocol As long as PaO2 to FiO2 ratio is less than 1:150 position in prone position for 16 hours a day Check CVP daily if CVL in place Target CVP less than 4, diurese as necessary Ventilator associated pneumonia prevention protocol 1/8 start barcitinib, repeat ABG supine to decide if needs prone  Need for sedation for mechanical ventilation PAD protocol RASS target -2 to -3 Fentanyl/propofol infusion  Hypertension: Monitor hemodynamics  Elevated BNP, troponin: uncertain if systolic or dystolic CHF, acute pulmonary edema, Myocarditis? 12 lead on 1/7 showed sinus tach, ? RV strain vs just right axis deviation Follow up echo   Best practice:  Diet: Start tube feeding Pain/Anxiety/Delirium protocol (if indicated): Yes, RASS target -2 to -3 VAP protocol (if indicated): Yes DVT prophylaxis: lovenox -0.5mg /kg bid GI prophylaxis: famotidine Glucose control: SSI Mobility: bed rest Code Status: full Family Communication: I updated her daughter Juliann Pulse by phone today Disposition: remain in ICU  Labs   CBC: Recent Labs  Lab 10/02/19 1008 10/02/19 2008 10/03/19 0336  WBC 23.5* 18.2* 16.7*  NEUTROABS 20.0*  --  14.3*  HGB 14.0 12.0 12.1  HCT 46.1* 38.3 38.9  MCV 92.9 90.8 90.0  PLT 398 326 403*    Basic Metabolic Panel: Recent Labs  Lab 10/02/19 1008 10/02/19 2008 10/03/19 0336  NA 138 139 140  K 4.6 4.6 4.2  CL 97* 99 101  CO2 24 26 23   GLUCOSE 159* 233* 235*  BUN 40* 52* 62*  CREATININE 1.18*  1.41* 1.49*  CALCIUM 8.9 8.6* 8.4*  MG  --  2.8* 2.7*  PHOS  --  3.7 3.4   GFR: Estimated Creatinine Clearance: 37 mL/min (A) (by C-G formula based on SCr of 1.49 mg/dL (H)). Recent Labs  Lab 10/02/19 1007 10/02/19 1008 10/02/19 1130 10/02/19 1224 10/02/19 2008 10/03/19 0336  PROCALCITON  --   --  0.72  --  3.69  --   WBC  --  23.5*  --   --  18.2* 16.7*  LATICACIDVEN 2.6*  --   --  1.6  --   --     Liver Function Tests: Recent Labs  Lab 10/02/19 1008 10/02/19 2008 10/03/19 0336  AST 106* 89* 58*  ALT 71* 70* 65*  ALKPHOS 78 70 75  BILITOT 0.9 0.8 0.4  PROT 8.0 7.1 6.9  ALBUMIN 3.3* 2.9* 2.9*   No results for input(s): LIPASE, AMYLASE in the last 168 hours. No results for input(s): AMMONIA in the last 168 hours.  ABG    Component Value Date/Time   PHART 7.368 10/02/2019 1618   PCO2ART 49.4 (H) 10/02/2019 1618   PO2ART 90.0 10/02/2019 1618   HCO3 26.3 10/02/2019 1618   O2SAT 96.0 10/02/2019 1618     Coagulation Profile: No results for input(s): INR, PROTIME in the last 168 hours.  Cardiac Enzymes: No results for input(s): CKTOTAL, CKMB, CKMBINDEX, TROPONINI in the last 168 hours.  HbA1C: No results found for: HGBA1C  CBG: Recent Labs  Lab 10/02/19 0953 10/02/19 2144 10/03/19 0348 10/03/19 0756  GLUCAP 169* 222* 230* 183*    Review of Systems:   Cannot obtain due to intubation  Past Medical History  She,  has a past medical history of Hypertension.   Surgical History    Past Surgical History:  Procedure Laterality Date  . COLONOSCOPY N/A 12/21/2016   Procedure: COLONOSCOPY;  Surgeon: Rogene Houston, MD;  Location: [facility];  Service: Endoscopy;  Laterality: N/A;  930  . NO PAST SURGERIES       Social History   reports that she has never smoked. She has never used smokeless tobacco. She reports that she does not drink alcohol or use drugs.   Family History   Her family history is negative for Colon cancer.   Allergies No Known  Allergies   Home Medications  Prior to Admission medications   Medication Sig Start Date End Date Taking? Authorizing Provider  albuterol (PROVENTIL) (2.5 MG/3ML) 0.083% nebulizer solution Take 2.5 mg by nebulization every 6 (six) hours. 09/30/19  Yes [provider]  azithromycin (ZITHROMAX) 250 MG tablet Take 1 tablet by mouth as directed. 2 tabs po on day 1 (09/30/19), 1 tab po daily thereafter x4 days. 09/30/19  Yes [provider]  omeprazole (PRILOSEC) 40 MG capsule Take 1 capsule by mouth daily. 07/24/19  Yes [provider]  ondansetron (ZOFRAN) 4 MG tablet Take 4 mg by mouth every  6 (six) hours as needed. 09/30/19  Yes [provider]  predniSONE (DELTASONE) 10 MG tablet Take 1 tablet by mouth as directed. 6 tabs on day 1(09/29/18), 5 tabs on day 2, 4 tabs on day 3, 3 tabs on day 4, 2 tabs on day 5, 1 tab on day 6. 09/30/19  Yes [provider]  amLODipine (NORVASC) 10 MG tablet Take 10 mg by mouth daily.    [provider]  aspirin EC 81 MG tablet Take 1 tablet (81 mg total) by mouth daily. 12/28/16   Rehman, Mechele Dawley, MD  hydrochlorothiazide (HYDRODIURIL) 25 MG tablet Take 25 mg by mouth daily.    [provider]  losartan-hydrochlorothiazide (HYZAAR) 100-25 MG tablet Take 1 tablet by mouth daily.    [provider]     Critical care time: 35 minutes     Roselie Awkward, MD [place] [facility] Pager: [phone] Cell: [phone] If no response, call [phone]

## 2019-10-03 NOTE — Progress Notes (Signed)
 Initial Nutrition Assessment  DOCUMENTATION CODES:   Not applicable  INTERVENTION:   Tube Feeding: Vital AF 1.2 at 40 ml/hr Pro-Stat 30 mL QID Provides 1552 kcals, 132 g of protein and 778 mL of free water Meets 100% of protein needs  TF regimen and propofol at current rate providing 1810 total kcal/day   Add free water 200 mL q 6 hours; total free water including TF: 1578 mL of free water   NUTRITION DIAGNOSIS:   Inadequate oral intake related to acute illness as evidenced by NPO status.  GOAL:   Patient will meet greater than or equal to 90% of their needs  MONITOR:   Vent status, Skin, TF tolerance, Weight trends, Labs  REASON FOR ASSESSMENT:   Consult, Ventilator Enteral/tube feeding initiation and management  ASSESSMENT:   69 yo female admitted with ARDS secondary to [place] pneumonia requiring intubation. PMH inculdes HTN  RD working remotely.  1/07 Admit, Intubated 1/08 TF initiated  Patient is currently intubated on ventilator support, sedated on fentanyl and propofol, phenylephrine MV: 8.7 L/min Temp (24hrs), Avg:98.8 F (37.1 C), Min:98 F (36.7 C), Max:99.8 F (37.7 C)  Propofol: 9.8 ml/hr  Vital High Protein at 40 ml/hr infusing  Noted CBGs >180 prior to TF initiation. Pt currently only ordered for sliding scale insulin. Pt will likely require additional coverage  Unable to obtain diet and weight history at this time; noted last previous weight encounter from 2018. Current wt 77.8 kg.   Labs: Creatinine 1.49, BUN 60, sodium 140 (wdl), potassium wdl, phosphorus wdl, CBGs 183-230  Meds: decadron, ss novolog  Diet Order:   Diet Order    None      EDUCATION NEEDS:   Not appropriate for education at this time  Skin:  Skin Assessment: Reviewed RN Assessment  Last BM:  no documented BM  Height:   Ht Readings from Last 1 Encounters:  10/02/19 5\' 4"  (1.626 m)    Weight:   Wt Readings from Last 1 Encounters:  10/03/19 77.8 kg     Ideal Body Weight:  54.6 kg  BMI:  Body mass index is 29.44 kg/m.  Estimated Nutritional Needs:   Kcal:  1605 kcals  Protein:  110-140 g  Fluid:  >/= 1.8 L    Bobby Barton MS, RDN, LDN, CNSC [phone] Pager  [phone] Weekend/On-Call Pager

## 2019-10-03 NOTE — Progress Notes (Signed)
Pt proned at this time with no complications. ETT secured at 21 at the lip with cloth tape.

## 2019-10-03 NOTE — Progress Notes (Signed)
  Echocardiogram 2D Echocardiogram has been performed.  Kristina [name] 10/03/2019, 2:15 PM

## 2019-10-03 NOTE — Progress Notes (Signed)
Order for PIV/Midline. Medications infusing need central access. [facility] 2nd floor staff notified to obtain order for PICC or central line.

## 2019-10-03 NOTE — Progress Notes (Signed)
Head repositioned. ETT remains secure. Arms rotated. Tolerated well.

## 2019-10-03 NOTE — Progress Notes (Signed)
Peripherally Inserted Central Catheter/Midline Placement  The IV Nurse has discussed with the patient and/or persons authorized to consent for the patient, the purpose of this procedure and the potential benefits and risks involved with this procedure.  The benefits include less needle sticks, lab draws from the catheter, and the patient may be discharged home with the catheter. Risks include, but not limited to, infection, bleeding, blood clot (thrombus formation), and puncture of an artery; nerve damage and irregular heartbeat and possibility to perform a PICC exchange if needed/ordered by physician.  Alternatives to this procedure were also discussed.  Bard Power PICC patient education guide, fact sheet on infection prevention and patient information card has been provided to patient /or left at bedside.    PICC/Midline Placement Documentation  PICC Double Lumen [id number] PICC Right Basilic 36 cm (Active)  Indication for Insertion or Continuance of Line Vasoactive infusions 10/03/19 0900  Site Assessment Clean;Dry;Intact 10/03/19 0900  Lumen #1 Status Flushed;Blood return noted 10/03/19 0900  Lumen #2 Status Flushed;Blood return noted 10/03/19 0900  Dressing Type Transparent 10/03/19 0900  Dressing Status Clean;Dry;Intact;Antimicrobial disc in place 10/03/19 0900  Dressing Change Due 10/10/19 10/03/19 0900       Jule Economy Horton 10/03/2019, 9:54 AM

## 2019-10-03 NOTE — Progress Notes (Signed)
[facility] PCCM  P:F ratio 130 Prone position order set placed  Roselie Awkward, MD [place] [facility] Pager: [phone] Cell: [phone] If no response, call [phone]

## 2019-10-03 NOTE — Progress Notes (Signed)
Pt's head repositioned, head turned to the right.  ETT secured. Arms rotated.

## 2019-10-04 LAB — COMPREHENSIVE METABOLIC PANEL
ALT: 58 U/L — ABNORMAL HIGH (ref 0–44)
AST: 33 U/L (ref 15–41)
Albumin: 2.6 g/dL — ABNORMAL LOW (ref 3.5–5.0)
Alkaline Phosphatase: 74 U/L (ref 38–126)
Anion gap: 12 (ref 5–15)
BUN: 92 mg/dL — ABNORMAL HIGH (ref 8–23)
CO2: 26 mmol/L (ref 22–32)
Calcium: 8.4 mg/dL — ABNORMAL LOW (ref 8.9–10.3)
Chloride: 104 mmol/L (ref 98–111)
Creatinine, Ser: 1.27 mg/dL — ABNORMAL HIGH (ref 0.44–1.00)
GFR calc Af Amer: 51 mL/min — ABNORMAL LOW (ref >60)
GFR calc non Af Amer: 44 mL/min — ABNORMAL LOW (ref >60)
Glucose, Bld: 302 mg/dL — ABNORMAL HIGH (ref 70–99)
Potassium: 3.9 mmol/L (ref 3.5–5.1)
Sodium: 142 mmol/L (ref 135–145)
Total Bilirubin: 0.4 mg/dL (ref 0.3–1.2)
Total Protein: 6.7 g/dL (ref 6.5–8.1)

## 2019-10-04 LAB — GLUCOSE, CAPILLARY
Glucose-Capillary: 202 mg/dL — ABNORMAL HIGH (ref 70–99)
Glucose-Capillary: 262 mg/dL — ABNORMAL HIGH (ref 70–99)
Glucose-Capillary: 271 mg/dL — ABNORMAL HIGH (ref 70–99)
Glucose-Capillary: 286 mg/dL — ABNORMAL HIGH (ref 70–99)
Glucose-Capillary: 297 mg/dL — ABNORMAL HIGH (ref 70–99)

## 2019-10-04 LAB — CBC WITH DIFFERENTIAL/PLATELET
Abs Immature Granulocytes: 0.17 10*3/uL — ABNORMAL HIGH (ref 0.00–0.07)
Basophils Absolute: 0 10*3/uL (ref 0.0–0.1)
Basophils Relative: 0 %
Eosinophils Absolute: 0 10*3/uL (ref 0.0–0.5)
Eosinophils Relative: 0 %
HCT: 56.1 % — ABNORMAL HIGH (ref 36.0–46.0)
Hemoglobin: 17.1 g/dL — ABNORMAL HIGH (ref 12.0–15.0)
Immature Granulocytes: 2 %
Lymphocytes Relative: 7 %
Lymphs Abs: 0.5 10*3/uL — ABNORMAL LOW (ref 0.7–4.0)
MCH: 27.7 pg (ref 26.0–34.0)
MCHC: 30.5 g/dL (ref 30.0–36.0)
MCV: 90.8 fL (ref 80.0–100.0)
Monocytes Absolute: 0.3 10*3/uL (ref 0.1–1.0)
Monocytes Relative: 4 %
Neutro Abs: 6.5 10*3/uL (ref 1.7–7.7)
Neutrophils Relative %: 87 %
Platelets: 233 10*3/uL (ref 150–400)
RBC: 6.18 MIL/uL — ABNORMAL HIGH (ref 3.87–5.11)
RDW: 14.9 % (ref 11.5–15.5)
WBC: 7.5 10*3/uL (ref 4.0–10.5)
nRBC: 0.3 % — ABNORMAL HIGH (ref 0.0–0.2)

## 2019-10-04 LAB — PHOSPHORUS: Phosphorus: 5 mg/dL — ABNORMAL HIGH (ref 2.5–4.6)

## 2019-10-04 LAB — TRIGLYCERIDES: Triglycerides: 302 mg/dL — ABNORMAL HIGH (ref <150)

## 2019-10-04 LAB — POCT I-STAT 7, (LYTES, BLD GAS, ICA,H+H)
Bicarbonate: 24.4 mmol/L (ref 20.0–28.0)
Calcium, Ion: 1.2 mmol/L (ref 1.15–1.40)
HCT: 33 % — ABNORMAL LOW (ref 36.0–46.0)
Hemoglobin: 11.2 g/dL — ABNORMAL LOW (ref 12.0–15.0)
O2 Saturation: 95 %
Patient temperature: 98.6
Potassium: 3.6 mmol/L (ref 3.5–5.1)
Sodium: 141 mmol/L (ref 135–145)
TCO2: 26 mmol/L (ref 22–32)
pCO2 arterial: 37.4 mmHg (ref 32.0–48.0)
pH, Arterial: 7.424 (ref 7.350–7.450)
pO2, Arterial: 72 mmHg — ABNORMAL LOW (ref 83.0–108.0)

## 2019-10-04 LAB — FERRITIN: Ferritin: 486 ng/mL — ABNORMAL HIGH (ref 11–307)

## 2019-10-04 LAB — D-DIMER, QUANTITATIVE: D-Dimer, Quant: 2.79 ug/mL-FEU — ABNORMAL HIGH (ref 0.00–0.50)

## 2019-10-04 LAB — C-REACTIVE PROTEIN: CRP: 16.2 mg/dL — ABNORMAL HIGH (ref <1.0)

## 2019-10-04 LAB — MAGNESIUM: Magnesium: 3.1 mg/dL — ABNORMAL HIGH (ref 1.7–2.4)

## 2019-10-04 MED ORDER — CEFAZOLIN SODIUM-DEXTROSE 1-4 GM/50ML-% IV SOLN
1.0000 g | Freq: Three times a day (TID) | INTRAVENOUS | Status: AC
  Administered 2019-10-05 – 2019-10-08 (×13): 1 g via INTRAVENOUS
  Filled 2019-10-04 (×17): qty 50

## 2019-10-04 MED ORDER — PANTOPRAZOLE SODIUM 40 MG PO PACK
40.0000 mg | PACK | Freq: Every day | ORAL | Status: DC
  Administered 2019-10-04 – 2019-10-05 (×2): 40 mg
  Filled 2019-10-04 (×3): qty 20

## 2019-10-04 NOTE — Progress Notes (Signed)
Updated daughter Redge Gainer. Questions and concerns were appropriately addressed. No other issues at this time.

## 2019-10-04 NOTE — Progress Notes (Signed)
Nutrition Follow-up  RD working remotely.  DOCUMENTATION CODES:   Not applicable  INTERVENTION:   Tube feeding can continue during prone positioning (this is NOT a contraindication to feeding). Consider post-pyloric Cortrak placement if gastric feedings are not tolerated well.  Continue tube feeding: - Vital AF 1.2 @ 40 ml/hr (960 ml/day) via OG tube - Pro-stat 30 ml QID - Free water 200 ml QID  Tube feeding regimen and free water provides 1552 kcal, 132 grams of protein, and 1578 ml of H2O.   Tube feeding regimen and current propofol provides 1940 total kcal.  NUTRITION DIAGNOSIS:   Inadequate oral intake related to acute illness as evidenced by NPO status.  Ongoing, being addressed via TF  GOAL:   Patient will meet greater than or equal to 90% of their needs  Met via TF  MONITOR:   Vent status, Skin, TF tolerance, Weight trends, Labs  REASON FOR ASSESSMENT:   Consult Enteral/tube feeding initiation and management  ASSESSMENT:   69 yo female admitted with ARDS secondary to [place] pneumonia requiring intubation. PMH inculdes HTN.  01/07 - admit, intubated 01/08 - TF initiated, pt proned  RD consulted for TF initiation and management. TF started yesterday, 1/08.  Pt is currently proned with plan to flip later this AM per RN note.  OG tube remains in place with TF currently infusing.  Weight stable. Per RN edema assessment, pt with mild pitting generalized edema.  Current TF: Vital AF 1.2 @ 40 ml/hr, Pro-stat 30 ml QID, free water 200 ml q 6 hours  Patient is currently intubated on ventilator support MV: 8.8 L/min Temp (24hrs), Avg:98.2 F (36.8 C), Min:97.5 F (36.4 C), Max:98.7 F (37.1 C) BP (cuff): 119/71 MAP (cuff): 86  Drips: Propofol: 14.7 ml/hr (provides 388 kcal daily from lipid) Fentanyl: 17.5 ml/hr  Medications reviewed and include: decadron, SSI q 4 hours, protonix, IV abx, remdesivir  Labs reviewed: BUN 92, creatinine 1.27,  phosphorus 5.0, magnesium 3.1, TG 302 CBG's: 200 x 24 hours  UOP: 905 ml x 24 hours I/O's: +1.6 L since admit  NUTRITION - FOCUSED PHYSICAL EXAM:  Unable to complete at this time. RD working remotely.  Diet Order:   Diet Order    None      EDUCATION NEEDS:   Not appropriate for education at this time  Skin:  Skin Assessment: Reviewed RN Assessment  Last BM:  no documented BM  Height:   Ht Readings from Last 1 Encounters:  10/02/19 5' 4"  (1.626 m)    Weight:   Wt Readings from Last 1 Encounters:  10/04/19 77.2 kg    Ideal Body Weight:  54.6 kg  BMI:  Body mass index is 29.21 kg/m.  Estimated Nutritional Needs:   Kcal:  1505  Protein:  110-140 grams  Fluid:  >/= 1.8 L    Gaynell Face, MS, RD, LDN Inpatient Clinical Dietitian Pager: [phone] Weekend/After Hours: [phone]

## 2019-10-04 NOTE — Progress Notes (Signed)
Head repositioned and arms rotated.  ETT secured

## 2019-10-04 NOTE — Progress Notes (Signed)
NAME:  Kristina [name], MRN:  [id number], DOB:  1950-10-10, LOS: 2 ADMISSION DATE:  10/02/2019, CONSULTATION DATE:  10/02/2019 REFERRING MD:  Melina Copa, CHIEF COMPLAINT:  dyspnea   Brief History   69 year old female admitted on January 7 due to ARDS from Covid pneumonia requiring intubation and mechanical ventilation at [facility].  History of present illness   This is a 68 year old female who came to [facility] today by EMS in the setting of being found minimally responsive and profoundly hypoxemic.  She was brought in by EMS, given breathing treatments by EMS, blood sugar was noted to be normal.  She was very lethargic.  O2 saturation was in the 80s.  Based on her poor mental status and hypoxemia she required intubation.  She was known to have Covid.  She was transferred to our facility for further evaluation.  Past Medical History  Hypertension  Significant Hospital Events   1/7 admission  Consults:  PCCM  Procedures:  1/7 ETT >   Significant Diagnostic Tests:  1/8 Echo >>>  Micro Data:  1/7 Res viral panel> pos SARS COV 2, neg Flu  Antimicrobials:  1/7 remdesivir 1/7 decadron   Interim history/subjective:   Intubated on mechanical support. Prone.   Objective   Blood pressure 107/68, pulse 60, temperature 98.5 F (36.9 C), temperature source Oral, resp. rate 20, height 5\' 4"  (1.626 m), weight 77.2 kg, SpO2 95 %.    Vent Mode: PRVC FiO2 (%):  [50 %] 50 % Set Rate:  [20 bmp] 20 bmp Vt Set:  [440 mL] 440 mL PEEP:  [12 cmH20] 12 cmH20 Plateau Pressure:  [24 cmH20-26 cmH20] 24 cmH20   Intake/Output Summary (Last 24 hours) at 10/04/2019 0818 Last data filed at 10/04/2019 0817 Gross per 24 hour  Intake 2232.7 ml  Output 805 ml  Net 1427.7 ml   Filed Weights   10/02/19 0951 10/03/19 0600 10/04/19 0500  Weight: 81.6 kg 77.8 kg 77.2 kg    Examination:  General: Intubated on mechanical life support critically ill.  Prone HENT: NCAT, endotracheal tube in  place PULM: Bilateral ventilated breath sounds CV: Regular rate rhythm, S1-S2 GI: Soft nontender nondistended bowel sounds present MSK: Normal bulk and tone Neuro: Sedated on mechanical vent   Resolved Hospital Problem list    Assessment & Plan:   ARDS due to COVID-19 pneumonia: Continue mechanical ventilation per ARDS protocol Target TVol 6-8cc/kgIBW Target Plateau Pressure < 30cm H20 Target driving pressure less than 15 cm of water Target PaO2 55-65: titrate PEEP/FiO2 per protocol As long as PaO2 to FiO2 ratio is less than 1:150 position in prone position for 16 hours a day Check CVP daily if CVL in place Target CVP less than 4, diurese as necessary Ventilator associated pneumonia prevention protocol Continue steroids plus remdesivir plus baricintinib  Plan to unprone today  Repeat cxr in AM   Need for sedation for mechanical ventilation PAD protocol RASS target -2 to -3 Continue fentanyl plus propofol infusion  Hypertension: Stable, continue to observe  Elevated BNP, troponin: uncertain if systolic or dystolic CHF, possible acute pulmonary edema, 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 RV strain vs just right axis deviation ECHO 1/8 depressed EF 45-50% Diuresis to maintain euvolemia. Avoid phenylephrine if pressor support needed   AKI Elevated serum creatinine Continue to observe serum creatinine and urine output.  Best practice:  Diet: Start tube feeding Pain/Anxiety/Delirium protocol (if indicated): Yes, RASS target -2 to -3  VAP protocol (if indicated): Yes DVT prophylaxis: lovenox -0.5mg /kg bid GI prophylaxis: famotidine Glucose control: SSI Mobility: bed rest Code Status: full Family Communication: I spoke with the patient's daughter Juliann Pulse via the phone this afternoon around 4:00. Disposition: remain in ICU  Labs   CBC: Recent Labs  Lab 10/02/19 1008 10/02/19 2008 10/03/19 0336 10/03/19 1555  WBC 23.5* 18.2* 16.7*   --   NEUTROABS 20.0*  --  14.3*  --   HGB 14.0 12.0 12.1 11.9*  HCT 46.1* 38.3 38.9 35.0*  MCV 92.9 90.8 90.0  --   PLT 398 326 403*  --     Basic Metabolic Panel: Recent Labs  Lab 10/02/19 1008 10/02/19 2008 10/03/19 0336 10/03/19 1555 10/03/19 1807  NA 138 139 140 142  --   K 4.6 4.6 4.2 3.8  --   CL 97* 99 101  --   --   CO2 24 26 23   --   --   GLUCOSE 159* 233* 235*  --   --   BUN 40* 52* 62*  --   --   CREATININE 1.18* 1.41* 1.49*  --   --   CALCIUM 8.9 8.6* 8.4*  --   --   MG  --  2.8* 2.7*  --  3.0*  PHOS  --  3.7 3.4  --  4.0   GFR: Estimated Creatinine Clearance: 36.8 mL/min (A) (by C-G formula based on SCr of 1.49 mg/dL (H)). Recent Labs  Lab 10/02/19 1007 10/02/19 1008 10/02/19 1130 10/02/19 1224 10/02/19 2008 10/03/19 0336  PROCALCITON  --   --  0.72  --  3.69  --   WBC  --  23.5*  --   --  18.2* 16.7*  LATICACIDVEN 2.6*  --   --  1.6  --   --     Liver Function Tests: Recent Labs  Lab 10/02/19 1008 10/02/19 2008 10/03/19 0336  AST 106* 89* 58*  ALT 71* 70* 65*  ALKPHOS 78 70 75  BILITOT 0.9 0.8 0.4  PROT 8.0 7.1 6.9  ALBUMIN 3.3* 2.9* 2.9*   No results for input(s): LIPASE, AMYLASE in the last 168 hours. No results for input(s): AMMONIA in the last 168 hours.  ABG    Component Value Date/Time   PHART 7.437 10/03/2019 1555   PCO2ART 41.4 10/03/2019 1555   PO2ART 65.0 (L) 10/03/2019 1555   HCO3 28.0 10/03/2019 1555   TCO2 29 10/03/2019 1555   O2SAT 93.0 10/03/2019 1555     Coagulation Profile: No results for input(s): INR, PROTIME in the last 168 hours.  Cardiac Enzymes: No results for input(s): CKTOTAL, CKMB, CKMBINDEX, TROPONINI in the last 168 hours.  HbA1C: Hgb A1c MFr Bld  Date/Time Value Ref Range Status  10/03/2019 03:48 AM 6.5 (H) 4.8 - 5.6 % Final    Comment:    (NOTE) Pre diabetes:          5.7%-6.4% Diabetes:              >6.4% Glycemic control for   <7.0% adults with diabetes     CBG: Recent Labs  Lab  10/02/19 0953 10/02/19 2144 10/03/19 0348 10/03/19 0756 10/03/19 1217  GLUCAP 169* 222* 230* 183* 200*    This patient is critically ill with multiple organ system failure; which, requires frequent high complexity decision making, assessment, support, evaluation, and titration of therapies. This was completed through the application of advanced monitoring technologies and extensive interpretation of multiple databases. During this encounter  critical care time was devoted to patient care services described in this note for 33 minutes.  [facility] 10/04/2019 8:18 AM

## 2019-10-04 NOTE — Progress Notes (Signed)
Patient placed in supine position by RT x 1 and RN x 6 without complications.  ETT remains secured at 12 withy cloth tape.

## 2019-10-04 NOTE — Progress Notes (Addendum)
0730: Assumed care of Pt from night RN. Pt sedated fentanyl and prop, -4 RASS as she is not synchronous otherwise. SpO2 97% on PRVC 50%/P12/R20/440. BP stable. Currently prone, will flip later this AM.   1055: Juliann Pulse updated via phone, all questions answered.   1200: VSS, SpO2 95% on PRVC 40%/P12/R20/440. Remains prone. Low urine output this AM. Foley flushed with 10 mL saline with 350 mL urine return after. Will monitor  1630: Sedation lightened minimally, Pt remains synchronous with vent. Remains on 40% FiO2, will not re-prone tonight.   1740: Per daughter request, called to updated to inform her she is still critically ill, but at this time she will not need to be proned again tonight.

## 2019-10-05 ENCOUNTER — Inpatient Hospital Stay (HOSPITAL_COMMUNITY): Payer: Medicare HMO

## 2019-10-05 DIAGNOSIS — N3000 Acute cystitis without hematuria: Secondary | ICD-10-CM

## 2019-10-05 LAB — CBC WITH DIFFERENTIAL/PLATELET
Abs Immature Granulocytes: 0.48 10*3/uL — ABNORMAL HIGH (ref 0.00–0.07)
Basophils Absolute: 0 10*3/uL (ref 0.0–0.1)
Basophils Relative: 0 %
Eosinophils Absolute: 0 10*3/uL (ref 0.0–0.5)
Eosinophils Relative: 0 %
HCT: 37.2 % (ref 36.0–46.0)
Hemoglobin: 11.5 g/dL — ABNORMAL LOW (ref 12.0–15.0)
Immature Granulocytes: 4 %
Lymphocytes Relative: 7 %
Lymphs Abs: 1 10*3/uL (ref 0.7–4.0)
MCH: 28.5 pg (ref 26.0–34.0)
MCHC: 30.9 g/dL (ref 30.0–36.0)
MCV: 92.3 fL (ref 80.0–100.0)
Monocytes Absolute: 0.7 10*3/uL (ref 0.1–1.0)
Monocytes Relative: 5 %
Neutro Abs: 11.5 10*3/uL — ABNORMAL HIGH (ref 1.7–7.7)
Neutrophils Relative %: 84 %
Platelets: 385 10*3/uL (ref 150–400)
RBC: 4.03 MIL/uL (ref 3.87–5.11)
RDW: 14.7 % (ref 11.5–15.5)
WBC: 13.6 10*3/uL — ABNORMAL HIGH (ref 4.0–10.5)
nRBC: 0 % (ref 0.0–0.2)

## 2019-10-05 LAB — COMPREHENSIVE METABOLIC PANEL
ALT: 47 U/L — ABNORMAL HIGH (ref 0–44)
AST: 20 U/L (ref 15–41)
Albumin: 2.6 g/dL — ABNORMAL LOW (ref 3.5–5.0)
Alkaline Phosphatase: 58 U/L (ref 38–126)
Anion gap: 10 (ref 5–15)
BUN: 71 mg/dL — ABNORMAL HIGH (ref 8–23)
CO2: 25 mmol/L (ref 22–32)
Calcium: 8.4 mg/dL — ABNORMAL LOW (ref 8.9–10.3)
Chloride: 106 mmol/L (ref 98–111)
Creatinine, Ser: 0.91 mg/dL (ref 0.44–1.00)
GFR calc Af Amer: >60 mL/min (ref >60)
GFR calc non Af Amer: >60 mL/min (ref >60)
Glucose, Bld: 312 mg/dL — ABNORMAL HIGH (ref 70–99)
Potassium: 4.3 mmol/L (ref 3.5–5.1)
Sodium: 141 mmol/L (ref 135–145)
Total Bilirubin: 0.3 mg/dL (ref 0.3–1.2)
Total Protein: 6.4 g/dL — ABNORMAL LOW (ref 6.5–8.1)

## 2019-10-05 LAB — TRIGLYCERIDES: Triglycerides: 284 mg/dL — ABNORMAL HIGH (ref <150)

## 2019-10-05 LAB — C-REACTIVE PROTEIN: CRP: 8.6 mg/dL — ABNORMAL HIGH (ref <1.0)

## 2019-10-05 LAB — GLUCOSE, CAPILLARY
Glucose-Capillary: 196 mg/dL — ABNORMAL HIGH (ref 70–99)
Glucose-Capillary: 256 mg/dL — ABNORMAL HIGH (ref 70–99)
Glucose-Capillary: 289 mg/dL — ABNORMAL HIGH (ref 70–99)

## 2019-10-05 LAB — D-DIMER, QUANTITATIVE: D-Dimer, Quant: 2.19 ug/mL-FEU — ABNORMAL HIGH (ref 0.00–0.50)

## 2019-10-05 LAB — FERRITIN: Ferritin: 294 ng/mL (ref 11–307)

## 2019-10-05 MED ORDER — INSULIN ASPART 100 UNIT/ML ~~LOC~~ SOLN
0.0000 [IU] | SUBCUTANEOUS | Status: DC
  Administered 2019-10-05: 16:00:00 5 [IU] via SUBCUTANEOUS
  Administered 2019-10-05: 23:00:00 8 [IU] via SUBCUTANEOUS
  Administered 2019-10-05: 20:00:00 3 [IU] via SUBCUTANEOUS
  Administered 2019-10-06 (×2): 8 [IU] via SUBCUTANEOUS
  Administered 2019-10-06: 3 [IU] via SUBCUTANEOUS
  Administered 2019-10-06: 21:00:00 5 [IU] via SUBCUTANEOUS
  Administered 2019-10-06: 18:00:00 3 [IU] via SUBCUTANEOUS
  Administered 2019-10-06: 04:00:00 5 [IU] via SUBCUTANEOUS
  Administered 2019-10-07 (×3): 3 [IU] via SUBCUTANEOUS
  Administered 2019-10-07 (×2): 2 [IU] via SUBCUTANEOUS
  Administered 2019-10-08 (×7): 3 [IU] via SUBCUTANEOUS
  Administered 2019-10-09: 12:00:00 5 [IU] via SUBCUTANEOUS
  Administered 2019-10-09 (×3): 3 [IU] via SUBCUTANEOUS
  Administered 2019-10-10: 13:00:00 2 [IU] via SUBCUTANEOUS
  Administered 2019-10-10 (×2): 3 [IU] via SUBCUTANEOUS
  Administered 2019-10-10: 05:00:00 5 [IU] via SUBCUTANEOUS
  Administered 2019-10-10: 2 [IU] via SUBCUTANEOUS
  Administered 2019-10-11: 20:00:00 3 [IU] via SUBCUTANEOUS
  Administered 2019-10-11 (×2): 2 [IU] via SUBCUTANEOUS
  Administered 2019-10-11 (×2): 3 [IU] via SUBCUTANEOUS
  Administered 2019-10-12 (×3): 2 [IU] via SUBCUTANEOUS
  Administered 2019-10-12: 13:00:00 3 [IU] via SUBCUTANEOUS
  Administered 2019-10-12: 17:00:00 2 [IU] via SUBCUTANEOUS
  Administered 2019-10-13: 13:00:00 3 [IU] via SUBCUTANEOUS

## 2019-10-05 MED ORDER — INSULIN DETEMIR 100 UNIT/ML ~~LOC~~ SOLN
8.0000 [IU] | Freq: Two times a day (BID) | SUBCUTANEOUS | Status: DC
  Administered 2019-10-05 – 2019-10-06 (×2): 8 [IU] via SUBCUTANEOUS
  Filled 2019-10-05 (×3): qty 0.08

## 2019-10-05 MED ORDER — DEXMEDETOMIDINE HCL IN NACL 400 MCG/100ML IV SOLN
0.4000 ug/kg/h | INTRAVENOUS | Status: DC
  Administered 2019-10-05: 17:00:00 0.6 ug/kg/h via INTRAVENOUS
  Administered 2019-10-05: 11:00:00 0.4 ug/kg/h via INTRAVENOUS
  Administered 2019-10-05: 23:00:00 0.8 ug/kg/h via INTRAVENOUS
  Administered 2019-10-06: 05:00:00 1 ug/kg/h via INTRAVENOUS
  Filled 2019-10-05 (×4): qty 100

## 2019-10-05 NOTE — Plan of Care (Signed)
  Problem: Coping: Goal: Psychosocial and spiritual needs will be supported Outcome: Progressing   Problem: Respiratory: Goal: Will maintain a patent airway Outcome: Progressing Goal: Complications related to the disease process, condition or treatment will be avoided or minimized Outcome: Progressing   Problem: Education: Goal: Knowledge of General Education information will improve Description: Including pain rating scale, medication(s)/side effects and non-pharmacologic comfort measures Outcome: Progressing   Problem: Clinical Measurements: Goal: Ability to maintain clinical measurements within normal limits will improve Outcome: Progressing Goal: Will remain free from infection Outcome: Progressing Goal: Diagnostic test results will improve Outcome: Progressing Goal: Respiratory complications will improve Outcome: Progressing Goal: Cardiovascular complication will be avoided Outcome: Progressing   Problem: Activity: Goal: Risk for activity intolerance will decrease Outcome: Progressing   Problem: Nutrition: Goal: Adequate nutrition will be maintained Outcome: Progressing   Problem: Coping: Goal: Level of anxiety will decrease Outcome: Progressing   Problem: Elimination: Goal: Will not experience complications related to bowel motility Outcome: Progressing Goal: Will not experience complications related to urinary retention Outcome: Progressing   Problem: Pain Managment: Goal: General experience of comfort will improve Outcome: Progressing   Problem: Safety: Goal: Ability to remain free from injury will improve Outcome: Progressing   Problem: Skin Integrity: Goal: Risk for impaired skin integrity will decrease Outcome: Progressing

## 2019-10-05 NOTE — Progress Notes (Signed)
Spoke with pts daughter Juliann Pulse.  Updated on pt status.  Pt currently following commands, but resting comfortably on ventilator.  All questions answered.

## 2019-10-05 NOTE — Progress Notes (Signed)
Updated given to pt's daughter via phone.

## 2019-10-05 NOTE — Progress Notes (Signed)
NAME:  Kristina [name], MRN:  [id number], DOB:  [birth year], LOS: 3 ADMISSION DATE:  10/02/2019, CONSULTATION DATE:  10/02/2019 REFERRING MD:  Melina Copa, CHIEF COMPLAINT:  dyspnea   Brief History   69 year old female admitted on January 7 due to ARDS from Covid pneumonia requiring intubation and mechanical ventilation at [facility].  History of present illness   This is a 69 year old female who came to [facility] today by EMS in the setting of being found minimally responsive and profoundly hypoxemic.  She was brought in by EMS, given breathing treatments by EMS, blood sugar was noted to be normal.  She was very lethargic.  O2 saturation was in the 80s.  Based on her poor mental status and hypoxemia she required intubation.  She was known to have Covid.  She was transferred to our facility for further evaluation.  Past Medical History  Hypertension  Significant Hospital Events   1/7 admission  Consults:  PCCM  Procedures:  1/7 ETT >   Significant Diagnostic Tests:  1/8 Echo >>>  Micro Data:  1/7 Res viral panel> pos SARS COV 2, neg Flu  Antimicrobials:  1/7 remdesivir 1/7 decadron   Interim history/subjective:   On prone yesterday evening.  Doing really well this morning.  Sedation lightened.  Tolerating SBT for almost an hour.  Objective   Blood pressure (!) 159/84, pulse 64, temperature 98.9 F (37.2 C), temperature source Oral, resp. rate 20, height 5\' 4"  (1.626 m), weight 77.2 kg, SpO2 94 %.    Vent Mode: PRVC FiO2 (%):  [40 %] 40 % Set Rate:  [20 bmp] 20 bmp Vt Set:  [440 mL] 440 mL PEEP:  [5 [id number] cmH20] 5 cmH20 Pressure Support:  [10 cmH20] 10 cmH20 Plateau Pressure:  [24 cmH20-27 cmH20] 25 cmH20   Intake/Output Summary (Last 24 hours) at 10/05/2019 1530 Last data filed at 10/05/2019 1500 Gross per 24 hour  Intake 2380.89 ml  Output 2125 ml  Net 255.89 ml   Filed Weights   10/02/19 0951 10/03/19 0600 10/04/19 0500  Weight: 81.6 kg 77.8 kg  77.2 kg    Examination:  General: Intubated on mechanical life support, sedation lightened able to follow basic commands HENT: NCAT, endotracheal tube in place PULM: Bilateral ventilated breath sounds CV: Regular rate and rhythm, S1-S2 GI: Soft, nontender nondistended bowel sounds present MSK: Normal bulk and tone Neuro: Sedated on mechanical ventilation   Resolved Hospital Problem list    Assessment & Plan:   ARDS due to COVID-19 pneumonia: Continue mechanical ventilation per ARDS protocol Target TVol 6-8cc/kgIBW Target Plateau Pressure < 30cm H20 Target driving pressure less than 15 cm of water Target PaO2 55-65: titrate PEEP/FiO2 per protocol As long as PaO2 to FiO2 ratio is less than 1:150 position in prone position for 16 hours a day Check CVP daily if CVL in place Target CVP less than 4, diurese as necessary Ventilator associated pneumonia prevention protocol Continue steroids plus remdesivir plus baricintinib Initial CRP 23 >> 16 >> 8.6 1/10: None proned, FiO2 40%, sedation lightened and able to tolerate SBT for almost an hour this morning. Stop continuous sedation, switch to Precedex plus as needed fentanyl and Versed  Need for sedation for mechanical ventilation PAD protocol goal RASS 0 to -1  Streptococcal urinary tract infection. Antibiotics deescalated to cefazolin  Hypertension: Stable  Elevated BNP, troponin: uncertain if systolic or dystolic CHF, possible acute pulmonary edema, 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  RV strain vs just right axis deviation ECHO 1/8 depressed EF 45-50% Diuresis to maintain euvolemia Continue to observe  AKI Elevated serum creatinine, improved Continue to follow urine output and serum creatinine. Avoid nephrotoxic agents.  Best practice:  Diet: Start tube feeding Pain/Anxiety/Delirium protocol (if indicated): Yes, RASS target -2 to -3 VAP protocol (if indicated): Yes DVT  prophylaxis: lovenox -0.5mg /kg bid GI prophylaxis: famotidine Glucose control: SSI Mobility: bed rest Code Status: full Family Communication: Spoke with patient's daughter yesterday evening. Disposition: remain in ICU  Labs   CBC: Recent Labs  Lab 10/02/19 1008 10/02/19 2008 10/03/19 0336 10/03/19 1555 10/04/19 0500 10/04/19 1508 10/05/19 0330  WBC 23.5* 18.2* 16.7*  --  7.5  --  13.6*  NEUTROABS 20.0*  --  14.3*  --  6.5  --  11.5*  HGB 14.0 12.0 12.1 11.9* 17.1* 11.2* 11.5*  HCT 46.1* 38.3 38.9 35.0* 56.1* 33.0* 37.2  MCV 92.9 90.8 90.0  --  90.8  --  92.3  PLT 398 326 403*  --  233  --  0000000    Basic Metabolic Panel: Recent Labs  Lab 10/02/19 1008 10/02/19 2008 10/03/19 0336 10/03/19 1555 10/03/19 1807 10/04/19 0500 10/04/19 1508 10/05/19 0330  NA 138 139 140 142  --  142 141 141  K 4.6 4.6 4.2 3.8  --  3.9 3.6 4.3  CL 97* 99 101  --   --  104  --  106  CO2 24 26 23   --   --  26  --  25  GLUCOSE 159* 233* 235*  --   --  302*  --  312*  BUN 40* 52* 62*  --   --  92*  --  71*  CREATININE 1.18* 1.41* 1.49*  --   --  1.27*  --  0.91  CALCIUM 8.9 8.6* 8.4*  --   --  8.4*  --  8.4*  MG  --  2.8* 2.7*  --  3.0* 3.1*  --   --   PHOS  --  3.7 3.4  --  4.0 5.0*  --   --    GFR: Estimated Creatinine Clearance: 60.3 mL/min (by C-G formula based on SCr of 0.91 mg/dL). Recent Labs  Lab 10/02/19 1007 10/02/19 1130 10/02/19 1224 10/02/19 2008 10/03/19 0336 10/04/19 0500 10/05/19 0330  PROCALCITON  --  0.72  --  3.69  --   --   --   WBC  --   --   --  18.2* 16.7* 7.5 13.6*  LATICACIDVEN 2.6*  --  1.6  --   --   --   --     Liver Function Tests: Recent Labs  Lab 10/02/19 1008 10/02/19 2008 10/03/19 0336 10/04/19 0500 10/05/19 0330  AST 106* 89* 58* 33 20  ALT 71* 70* 65* 58* 47*  ALKPHOS 78 70 75 74 58  BILITOT 0.9 0.8 0.4 0.4 0.3  PROT 8.0 7.1 6.9 6.7 6.4*  ALBUMIN 3.3* 2.9* 2.9* 2.6* 2.6*   No results for input(s): LIPASE, AMYLASE in the last 168  hours. No results for input(s): AMMONIA in the last 168 hours.  ABG    Component Value Date/Time   PHART 7.424 10/04/2019 1508   PCO2ART 37.4 10/04/2019 1508   PO2ART 72.0 (L) 10/04/2019 1508   HCO3 24.4 10/04/2019 1508   TCO2 26 10/04/2019 1508   O2SAT 95.0 10/04/2019 1508     Coagulation Profile: No results for input(s): INR, PROTIME in the last  168 hours.  Cardiac Enzymes: No results for input(s): CKTOTAL, CKMB, CKMBINDEX, TROPONINI in the last 168 hours.  HbA1C: Hgb A1c MFr Bld  Date/Time Value Ref Range Status  10/03/2019 03:48 AM 6.5 (H) 4.8 - 5.6 % Final    Comment:    (NOTE) Pre diabetes:          5.7%-6.4% Diabetes:              >6.4% Glycemic control for   <7.0% adults with diabetes     CBG: Recent Labs  Lab 10/04/19 0051 10/04/19 0457 10/04/19 0731 10/04/19 1155 10/05/19 0037  GLUCAP 297* 286* 271* 262* 289*    This patient is critically ill with multiple organ system failure; which, requires frequent high complexity decision making, assessment, support, evaluation, and titration of therapies. This was completed through the application of advanced monitoring technologies and extensive interpretation of multiple databases. During this encounter critical care time was devoted to patient care services described in this note for 34 minutes.  Garner Nash, DO [facility] 10/05/2019 3:31 PM

## 2019-10-05 NOTE — Progress Notes (Signed)
Attempted wean on PSV/CPAP this morning around 0920. Pt maintained until around 1100. She was placed back in full support after RN spoke with Dr. Valeta Harms. Pt is now resting comfortably.

## 2019-10-06 ENCOUNTER — Inpatient Hospital Stay (HOSPITAL_COMMUNITY): Payer: Medicare HMO

## 2019-10-06 LAB — CBC WITH DIFFERENTIAL/PLATELET
Abs Immature Granulocytes: 0.65 10*3/uL — ABNORMAL HIGH (ref 0.00–0.07)
Basophils Absolute: 0.1 10*3/uL (ref 0.0–0.1)
Basophils Relative: 0 %
Eosinophils Absolute: 0 10*3/uL (ref 0.0–0.5)
Eosinophils Relative: 0 %
HCT: 41.4 % (ref 36.0–46.0)
Hemoglobin: 12.4 g/dL (ref 12.0–15.0)
Immature Granulocytes: 4 %
Lymphocytes Relative: 5 %
Lymphs Abs: 0.7 10*3/uL (ref 0.7–4.0)
MCH: 27.7 pg (ref 26.0–34.0)
MCHC: 30 g/dL (ref 30.0–36.0)
MCV: 92.6 fL (ref 80.0–100.0)
Monocytes Absolute: 0.8 10*3/uL (ref 0.1–1.0)
Monocytes Relative: 5 %
Neutro Abs: 14 10*3/uL — ABNORMAL HIGH (ref 1.7–7.7)
Neutrophils Relative %: 86 %
Platelets: 424 10*3/uL — ABNORMAL HIGH (ref 150–400)
RBC: 4.47 MIL/uL (ref 3.87–5.11)
RDW: 14.6 % (ref 11.5–15.5)
WBC: 16.3 10*3/uL — ABNORMAL HIGH (ref 4.0–10.5)
nRBC: 0 % (ref 0.0–0.2)

## 2019-10-06 LAB — COMPREHENSIVE METABOLIC PANEL
ALT: 49 U/L — ABNORMAL HIGH (ref 0–44)
AST: 41 U/L (ref 15–41)
Albumin: 2.7 g/dL — ABNORMAL LOW (ref 3.5–5.0)
Alkaline Phosphatase: 61 U/L (ref 38–126)
Anion gap: 12 (ref 5–15)
BUN: 43 mg/dL — ABNORMAL HIGH (ref 8–23)
CO2: 25 mmol/L (ref 22–32)
Calcium: 8.6 mg/dL — ABNORMAL LOW (ref 8.9–10.3)
Chloride: 104 mmol/L (ref 98–111)
Creatinine, Ser: 0.68 mg/dL (ref 0.44–1.00)
GFR calc Af Amer: >60 mL/min (ref >60)
GFR calc non Af Amer: >60 mL/min (ref >60)
Glucose, Bld: 271 mg/dL — ABNORMAL HIGH (ref 70–99)
Potassium: 4.3 mmol/L (ref 3.5–5.1)
Sodium: 141 mmol/L (ref 135–145)
Total Bilirubin: 0.6 mg/dL (ref 0.3–1.2)
Total Protein: 6.6 g/dL (ref 6.5–8.1)

## 2019-10-06 LAB — GLUCOSE, CAPILLARY
Glucose-Capillary: 200 mg/dL — ABNORMAL HIGH (ref 70–99)
Glucose-Capillary: 274 mg/dL — ABNORMAL HIGH (ref 70–99)
Glucose-Capillary: 240 mg/dL — ABNORMAL HIGH (ref 70–99)
Glucose-Capillary: 281 mg/dL — ABNORMAL HIGH (ref 70–99)
Glucose-Capillary: 287 mg/dL — ABNORMAL HIGH (ref 70–99)
Glucose-Capillary: 224 mg/dL — ABNORMAL HIGH (ref 70–99)
Glucose-Capillary: 256 mg/dL — ABNORMAL HIGH (ref 70–99)
Glucose-Capillary: 246 mg/dL — ABNORMAL HIGH (ref 70–99)
Glucose-Capillary: 287 mg/dL — ABNORMAL HIGH (ref 70–99)
Glucose-Capillary: 291 mg/dL — ABNORMAL HIGH (ref 70–99)
Glucose-Capillary: 198 mg/dL — ABNORMAL HIGH (ref 70–99)
Glucose-Capillary: 203 mg/dL — ABNORMAL HIGH (ref 70–99)
Glucose-Capillary: 170 mg/dL — ABNORMAL HIGH (ref 70–99)

## 2019-10-06 LAB — FERRITIN: Ferritin: 254 ng/mL (ref 11–307)

## 2019-10-06 LAB — C-REACTIVE PROTEIN: CRP: 5.2 mg/dL — ABNORMAL HIGH (ref <1.0)

## 2019-10-06 LAB — D-DIMER, QUANTITATIVE: D-Dimer, Quant: 3.01 ug/mL-FEU — ABNORMAL HIGH (ref 0.00–0.50)

## 2019-10-06 LAB — TRIGLYCERIDES: Triglycerides: 427 mg/dL — ABNORMAL HIGH (ref <150)

## 2019-10-06 MED ORDER — ONDANSETRON HCL 4 MG/2ML IJ SOLN
4.0000 mg | Freq: Four times a day (QID) | INTRAMUSCULAR | Status: DC | PRN
  Administered 2019-10-06: 4 mg via INTRAVENOUS
  Filled 2019-10-06: qty 2

## 2019-10-06 MED ORDER — PHENYLEPHRINE HCL-NACL 10-0.9 MG/250ML-% IV SOLN
INTRAVENOUS | Status: AC
  Filled 2019-10-06: qty 250

## 2019-10-06 MED ORDER — HYDRALAZINE HCL 20 MG/ML IJ SOLN
10.0000 mg | Freq: Four times a day (QID) | INTRAMUSCULAR | Status: DC | PRN
  Administered 2019-10-06 – 2019-10-07 (×6): 10 mg via INTRAVENOUS
  Filled 2019-10-06 (×5): qty 1

## 2019-10-06 MED ORDER — HYDRALAZINE HCL 20 MG/ML IJ SOLN
INTRAMUSCULAR | Status: AC
  Filled 2019-10-06: qty 1

## 2019-10-06 MED ORDER — INSULIN DETEMIR 100 UNIT/ML ~~LOC~~ SOLN
12.0000 [IU] | Freq: Two times a day (BID) | SUBCUTANEOUS | Status: DC
  Administered 2019-10-06 – 2019-10-07 (×2): 12 [IU] via SUBCUTANEOUS
  Filled 2019-10-06 (×3): qty 0.12

## 2019-10-06 NOTE — Procedures (Signed)
Extubation Procedure Note  Patient Details:   Name: Kristina [name] DOB: 05-20-1951 MRN: [id number]   Airway Documentation:  Airway 7.5 mm (Active)  Secured at (cm) 21 cm 10/06/19 0825  Measured From Lips 10/06/19 0825  Secured Location Right 10/06/19 0825  Secured By [employer] 10/06/19 0825  Tube Holder Repositioned Yes 10/06/19 0825  Cuff Pressure (cm H2O) 30 cm H2O 10/05/19 2030  Site Condition Dry 10/06/19 0825   Vent end date: (not recorded) Vent end time: (not recorded)   Evaluation  O2 sats: stable throughout Complications: No apparent complications Patient did tolerate procedure well. Bilateral Breath Sounds: Clear, Diminished   Yes   Pt extubated per physician order. Prior to extubation pt suctioned via ETT and orally with moderate yellow secretions. Pt with positive cuff leak. Post extubation pt placed on 6L nasal cannula with humidity. Pt tolerated well with good cough and no stridor heard. RT will continue to monitor.   Sharla Kidney 10/06/2019, 3:28 PM

## 2019-10-06 NOTE — Progress Notes (Signed)
Notified MD of BP

## 2019-10-06 NOTE — Progress Notes (Signed)
Pt extubated  10/06/2019 at 1425.

## 2019-10-06 NOTE — Progress Notes (Signed)
NAME:  Kristina [name], MRN:  [id number], DOB:  1951-03-26, LOS: 4 ADMISSION DATE:  10/02/2019, CONSULTATION DATE:  10/02/2019 REFERRING MD:  Melina Copa, CHIEF COMPLAINT:  dyspnea   Brief History   69 year old female admitted on January 7 due to ARDS from Covid pneumonia requiring intubation and mechanical ventilation at [facility] for hypoxia and encephalopathy   Past Medical History  Hypertension  Significant Hospital Events   1/7 admission 1/8 , 1/9  prone  Consults:  PCCM  Procedures:  1/7 ETT >   Significant Diagnostic Tests:  1/8 Echo >>> global hypokinesis, EF 45 to 50%  Micro Data:  1/7 Res viral panel> pos SARS COV 2, neg Flu  Antimicrobials:  1/7 remdesivir 1/7 decadron   Interim history/subjective:   Afebrile Awake on Precedex and fentanyl drip Last prone was 1/9  Objective   Blood pressure 133/76, pulse (!) 109, temperature 98.7 F (37.1 C), temperature source Axillary, resp. rate (!) 21, height 5\' 4"  (1.626 m), weight 82 kg, SpO2 91 %.    Vent Mode: PSV;CPAP FiO2 (%):  [40 %] 40 % Set Rate:  [20 bmp] 20 bmp Vt Set:  [440 mL] 440 mL PEEP:  [5 cmH20-10 cmH20] 5 cmH20 Pressure Support:  [5 cmH20-10 cmH20] 5 cmH20 Plateau Pressure:  [18 cmH20-28 cmH20] 18 cmH20   Intake/Output Summary (Last 24 hours) at 10/06/2019 1208 Last data filed at 10/06/2019 0915 Gross per 24 hour  Intake 2099.37 ml  Output 2600 ml  Net -500.63 ml   Filed Weights   10/03/19 0600 10/04/19 0500 10/06/19 0416  Weight: 77.8 kg 77.2 kg 82 kg    Examination:  General: Intubated , on Precedex/fentanyl drip, elderly, well-nourished HENT: NCAT, endotracheal tube in place PULM: Bilateral ventilated breath sounds CV: Regular rate and rhythm, S1-S2 GI: Soft, nontender nondistended bowel sounds present MSK: Normal bulk and tone Neuro: RASS -1, follows one-step commands   Chest x-ray 1/10 personally reviewed which shows improved bilateral airspace disease  Labs show  hyperglycemia, normal electrolytes, mild leukocytosis  Resolved Hospital Problem list    Assessment & Plan:   ARDS due to COVID-19 pneumonia:  ARDS protocol Target TVol 6-8cc/kgIBW Target Plateau Pressure < 30cm H20 Target driving pressure less than 15 cm of water Proceed with spontaneous breathing trial >> she tolerated pressure support with good tidal volume, saturation 91% on 40% FiO2 >> seen with extubation  Continue steroids plus remdesivir     Need for sedation for mechanical ventilation PAD protocol goal RASS 0 to -1, can discontinue Precedex and fentanyl once extubated  Streptococcal urinary tract infection- 70 k  cefazolin x 5ds total  Hypertension: Stable  Elevated BNP, troponin: uncertain if systolic or dystolic CHF, possible acute pulmonary edema, 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 RV strain vs just right axis deviation ECHO 1/8 depressed EF 45-50%  Lasix 40x1 today  AKI -resolved Continue to follow urine output and serum creatinine. Avoid nephrotoxic agents.  Best practice:  Diet: Start tube feeding Pain/Anxiety/Delirium protocol (if indicated):  RASS 0, Precedex/fentanyl VAP protocol (if indicated): Yes DVT prophylaxis: lovenox -0.5mg /kg bid GI prophylaxis: famotidine Glucose control: SSI Mobility: bed rest Code Status: full Family Communication:  daughter  Disposition:  ICU   The patient is critically ill with multiple organ systems failure and requires high complexity decision making for assessment and support, frequent evaluation and titration of therapies, application of advanced monitoring technologies and extensive interpretation of multiple databases. Critical Care Time devoted  to patient care services described in this note independent of APP/resident  time is 35 minutes.   Kara Mead MD. Shade Flood. [facility]  If no response to pager , please call [phone]   10/06/2019

## 2019-10-06 NOTE — Progress Notes (Signed)
Updated pt's daughter Juliann Pulse and answered all questions at this time.

## 2019-10-07 LAB — CBC WITH DIFFERENTIAL/PLATELET
Abs Immature Granulocytes: 0.8 10*3/uL — ABNORMAL HIGH (ref 0.00–0.07)
Basophils Absolute: 0.1 10*3/uL (ref 0.0–0.1)
Basophils Relative: 0 %
Eosinophils Absolute: 0 10*3/uL (ref 0.0–0.5)
Eosinophils Relative: 0 %
HCT: 45 % (ref 36.0–46.0)
Hemoglobin: 13.8 g/dL (ref 12.0–15.0)
Immature Granulocytes: 4 %
Lymphocytes Relative: 4 %
Lymphs Abs: 0.8 10*3/uL (ref 0.7–4.0)
MCH: 28.6 pg (ref 26.0–34.0)
MCHC: 30.7 g/dL (ref 30.0–36.0)
MCV: 93.2 fL (ref 80.0–100.0)
Monocytes Absolute: 0.8 10*3/uL (ref 0.1–1.0)
Monocytes Relative: 4 %
Neutro Abs: 17.9 10*3/uL — ABNORMAL HIGH (ref 1.7–7.7)
Neutrophils Relative %: 88 %
Platelets: 487 10*3/uL — ABNORMAL HIGH (ref 150–400)
RBC: 4.83 MIL/uL (ref 3.87–5.11)
RDW: 14.8 % (ref 11.5–15.5)
WBC: 20.3 10*3/uL — ABNORMAL HIGH (ref 4.0–10.5)
nRBC: 0 % (ref 0.0–0.2)

## 2019-10-07 LAB — COMPREHENSIVE METABOLIC PANEL WITH GFR
ALT: 53 U/L — ABNORMAL HIGH (ref 0–44)
AST: 42 U/L — ABNORMAL HIGH (ref 15–41)
Albumin: 2.8 g/dL — ABNORMAL LOW (ref 3.5–5.0)
Alkaline Phosphatase: 61 U/L (ref 38–126)
Anion gap: 16 — ABNORMAL HIGH (ref 5–15)
BUN: 36 mg/dL — ABNORMAL HIGH (ref 8–23)
CO2: 26 mmol/L (ref 22–32)
Calcium: 9 mg/dL (ref 8.9–10.3)
Chloride: 100 mmol/L (ref 98–111)
Creatinine, Ser: 0.58 mg/dL (ref 0.44–1.00)
GFR calc Af Amer: >60 mL/min
GFR calc non Af Amer: >60 mL/min
Glucose, Bld: 130 mg/dL — ABNORMAL HIGH (ref 70–99)
Potassium: 4.3 mmol/L (ref 3.5–5.1)
Sodium: 142 mmol/L (ref 135–145)
Total Bilirubin: 0.5 mg/dL (ref 0.3–1.2)
Total Protein: 6.6 g/dL (ref 6.5–8.1)

## 2019-10-07 LAB — GLUCOSE, CAPILLARY
Glucose-Capillary: 146 mg/dL — ABNORMAL HIGH (ref 70–99)
Glucose-Capillary: 162 mg/dL — ABNORMAL HIGH (ref 70–99)
Glucose-Capillary: 152 mg/dL — ABNORMAL HIGH (ref 70–99)
Glucose-Capillary: 149 mg/dL — ABNORMAL HIGH (ref 70–99)
Glucose-Capillary: 153 mg/dL — ABNORMAL HIGH (ref 70–99)
Glucose-Capillary: 140 mg/dL — ABNORMAL HIGH (ref 70–99)

## 2019-10-07 LAB — D-DIMER, QUANTITATIVE: D-Dimer, Quant: 3.49 ug{FEU}/mL — ABNORMAL HIGH (ref 0.00–0.50)

## 2019-10-07 LAB — FERRITIN: Ferritin: 245 ng/mL (ref 11–307)

## 2019-10-07 LAB — C-REACTIVE PROTEIN: CRP: 3.6 mg/dL — ABNORMAL HIGH

## 2019-10-07 MED ORDER — DEXAMETHASONE SODIUM PHOSPHATE 10 MG/ML IJ SOLN
6.0000 mg | INTRAMUSCULAR | Status: DC
  Administered 2019-10-07 – 2019-10-11 (×5): 6 mg via INTRAVENOUS
  Filled 2019-10-07 (×5): qty 1

## 2019-10-07 MED ORDER — PANTOPRAZOLE SODIUM 40 MG IV SOLR
40.0000 mg | Freq: Every day | INTRAVENOUS | Status: DC
  Administered 2019-10-07 – 2019-10-09 (×3): 40 mg via INTRAVENOUS
  Filled 2019-10-07 (×3): qty 40

## 2019-10-07 MED ORDER — METOPROLOL TARTRATE 5 MG/5ML IV SOLN
5.0000 mg | Freq: Three times a day (TID) | INTRAVENOUS | Status: DC
  Administered 2019-10-07: 5 mg via INTRAVENOUS
  Filled 2019-10-07: qty 5

## 2019-10-07 MED ORDER — METOPROLOL TARTRATE 5 MG/5ML IV SOLN
7.5000 mg | Freq: Four times a day (QID) | INTRAVENOUS | Status: DC
  Administered 2019-10-07 – 2019-10-08 (×4): 7.5 mg via INTRAVENOUS
  Filled 2019-10-07 (×4): qty 10

## 2019-10-07 MED ORDER — HYDRALAZINE HCL 20 MG/ML IJ SOLN
15.0000 mg | Freq: Four times a day (QID) | INTRAMUSCULAR | Status: DC | PRN
  Administered 2019-10-08 (×3): 15 mg via INTRAVENOUS
  Filled 2019-10-07 (×3): qty 1

## 2019-10-07 MED ORDER — INSULIN DETEMIR 100 UNIT/ML ~~LOC~~ SOLN
12.0000 [IU] | Freq: Every day | SUBCUTANEOUS | Status: DC
  Administered 2019-10-08 – 2019-10-13 (×6): 12 [IU] via SUBCUTANEOUS
  Filled 2019-10-07 (×6): qty 0.12

## 2019-10-07 NOTE — Progress Notes (Addendum)
BP elevated 169/84 map107.  Per Orderg, gave Hydralazine 10 mg IVP.   1800 repeat BP increased to 174 then 184.  Patient denies being in any pain.   Oxygen now at 8L [place] sats 88%.   Patient still denies any pain.   Discussed with Dr. Clydene Laming.  Received new order for Metoprolol 7.5MG  IVP.  Administered.

## 2019-10-07 NOTE — Progress Notes (Signed)
Called patient's daughter, Juliann Pulse and told her patient would be transferred to the Progressive unit. Answered questions regarding patient care. She will call patient later this evening.

## 2019-10-07 NOTE — Progress Notes (Signed)
Called Wells Guiles in Progressive at [phone] and gave report on patient. Patient will be going to [address].

## 2019-10-07 NOTE — Progress Notes (Signed)
Received patient from [place] patient now in room 122 on [facility].   Monitors attached to pa tient, BP 164/84 map 108, RR 24., HR 99, sats 89-901 5L [place],  Blood glucose 149.  Foley 14Fr in place.

## 2019-10-07 NOTE — Progress Notes (Addendum)
Pt's O2 increased from 4L to 5L. Sats 90-92% . Provided teaching on IS. Achieves 500 on best attempt at IS. Encouraged her to cough and deep breath as well.. Nausea earlier in shift. PRN zofran given with good effect. AOX4 but lethargic. Promoting activity.

## 2019-10-07 NOTE — Progress Notes (Addendum)
   NAME:  Kristina [name], MRN:  [id number], DOB:  [birth year], LOS: 5 ADMISSION DATE:  10/02/2019, CONSULTATION DATE:  10/02/2019 REFERRING MD:  Melina Copa, CHIEF COMPLAINT:  dyspnea   Brief History   69 year old female admitted on January 7 due to ARDS from Covid pneumonia requiring intubation and mechanical ventilation at [facility] for hypoxia and encephalopathy   Past Medical History  Hypertension  Significant Hospital Events   1/7 admission 1/8 , 1/9  prone  Consults:  PCCM  Procedures:  1/7 ETT > 1/11  Significant Diagnostic Tests:  1/8 Echo >>> global hypokinesis, EF 45 to 50%  Micro Data:  1/7 Res viral panel> pos SARS COV 2, neg Flu  Antimicrobials:  1/7 remdesivir >> 1/11 1/7 decadron   Interim history/subjective:   No chest pain or dyspnea. Remains on 5 L nasal cannula overnight post extubation Hypertensive this a.m.  Objective   Blood pressure (!) 172/91, pulse 100, temperature 98.9 F (37.2 C), temperature source Oral, resp. rate (!) 24, height 4\' 11"  (1.499 m), weight 79.7 kg, SpO2 (!) 86 %.        Intake/Output Summary (Last 24 hours) at 10/07/2019 1504 Last data filed at 10/07/2019 1145 Gross per 24 hour  Intake 346.56 ml  Output 2125 ml  Net -1778.44 ml   Filed Weights   10/04/19 0500 10/06/19 0416 10/07/19 0547  Weight: 77.2 kg 82 kg 79.7 kg    Examination:  General:elderly, well-nourished , no distress HENT: NCAT,  PULM: Bilateral decreased breath sounds CV: Regular rate and rhythm, S1-S2 GI: Soft, nontender nondistended bowel sounds present MSK: Normal bulk and tone Neuro: Awake, interactive appears weak and deconditioned   Chest x-ray 1/11 personally reviewed which shows right worsening of bilateral airspace disease  Labs show mild hyperglycemia, normal electrolytes, mild increasing leukocytosis  Resolved Hospital Problem list   AKI   Assessment & Plan:   ARDS due to COVID-19 pneumonia:  -Extubated 1/11 and has done  well -Completed remdesivir -Continue dexamethasone for 5 days -Needs aggressive PT -Swallow evaluation and advance   Streptococcal urinary tract infection- 70 k  cefazolin x 5ds total  Hypertension: -Add Lopressor 5 every 6 IV until able to resume Coreg oral  Elevated BNP, troponin: uncertain if systolic or dystolic CHF, possible acute pulmonary edema, 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 RV strain vs just right axis deviation ECHO 1/8 depressed EF 45-50%  Lasix 40x1 today  To triad 1/13, discussed with Dr. Marlou Porch practice:  Diet: Pending swallow eval DVT prophylaxis: lovenox -0.5mg /kg bid Mobility: Bed to chair Code Status: full Family Communication: RN updated daughter  Disposition: Floor   The patient is critically ill with multiple organ systems failure and requires high complexity decision making for assessment and support, frequent evaluation and titration of therapies, application of advanced monitoring technologies and extensive interpretation of multiple databases. Critical Care Time devoted to patient care services described in this note independent of APP/resident  time is 31 minutes.    Kara Mead MD. Shade Flood. [facility]  If no response to pager , please call [phone]   10/07/2019

## 2019-10-08 DIAGNOSIS — B370 Candidal stomatitis: Secondary | ICD-10-CM

## 2019-10-08 DIAGNOSIS — N390 Urinary tract infection, site not specified: Secondary | ICD-10-CM

## 2019-10-08 DIAGNOSIS — IMO0002 Reserved for concepts with insufficient information to code with codable children: Secondary | ICD-10-CM

## 2019-10-08 DIAGNOSIS — J1282 Pneumonia due to coronavirus disease 2019: Secondary | ICD-10-CM

## 2019-10-08 DIAGNOSIS — I10 Essential (primary) hypertension: Secondary | ICD-10-CM

## 2019-10-08 DIAGNOSIS — E1165 Type 2 diabetes mellitus with hyperglycemia: Secondary | ICD-10-CM

## 2019-10-08 DIAGNOSIS — U071 COVID-19: Secondary | ICD-10-CM

## 2019-10-08 LAB — CBC
HCT: 47.4 % — ABNORMAL HIGH (ref 36.0–46.0)
Hemoglobin: 14 g/dL (ref 12.0–15.0)
MCH: 27.6 pg (ref 26.0–34.0)
MCHC: 29.5 g/dL — ABNORMAL LOW (ref 30.0–36.0)
MCV: 93.5 fL (ref 80.0–100.0)
Platelets: 433 10*3/uL — ABNORMAL HIGH (ref 150–400)
RBC: 5.07 MIL/uL (ref 3.87–5.11)
RDW: 15.3 % (ref 11.5–15.5)
WBC: 18.3 10*3/uL — ABNORMAL HIGH (ref 4.0–10.5)
nRBC: 0 % (ref 0.0–0.2)

## 2019-10-08 LAB — BASIC METABOLIC PANEL
Anion gap: 13 (ref 5–15)
BUN: 38 mg/dL — ABNORMAL HIGH (ref 8–23)
CO2: 26 mmol/L (ref 22–32)
Calcium: 9 mg/dL (ref 8.9–10.3)
Chloride: 103 mmol/L (ref 98–111)
Creatinine, Ser: 0.63 mg/dL (ref 0.44–1.00)
GFR calc Af Amer: >60 mL/min (ref >60)
GFR calc non Af Amer: >60 mL/min (ref >60)
Glucose, Bld: 171 mg/dL — ABNORMAL HIGH (ref 70–99)
Potassium: 4 mmol/L (ref 3.5–5.1)
Sodium: 142 mmol/L (ref 135–145)

## 2019-10-08 LAB — GLUCOSE, CAPILLARY
Glucose-Capillary: 152 mg/dL — ABNORMAL HIGH (ref 70–99)
Glucose-Capillary: 162 mg/dL — ABNORMAL HIGH (ref 70–99)
Glucose-Capillary: 172 mg/dL — ABNORMAL HIGH (ref 70–99)
Glucose-Capillary: 177 mg/dL — ABNORMAL HIGH (ref 70–99)
Glucose-Capillary: 185 mg/dL — ABNORMAL HIGH (ref 70–99)
Glucose-Capillary: 192 mg/dL — ABNORMAL HIGH (ref 70–99)

## 2019-10-08 LAB — CULTURE, BLOOD (ROUTINE X 2)
Culture: NO GROWTH
Culture: NO GROWTH
Special Requests: ADEQUATE

## 2019-10-08 MED ORDER — CARVEDILOL 3.125 MG PO TABS
3.1250 mg | ORAL_TABLET | Freq: Two times a day (BID) | ORAL | Status: DC
  Administered 2019-10-08 – 2019-10-13 (×10): 3.125 mg via ORAL
  Filled 2019-10-08 (×10): qty 1

## 2019-10-08 MED ORDER — NYSTATIN 100000 UNIT/ML MT SUSP
5.0000 mL | Freq: Four times a day (QID) | OROMUCOSAL | Status: DC
  Administered 2019-10-08 – 2019-10-09 (×4): 500000 [IU] via ORAL
  Filled 2019-10-08 (×23): qty 5

## 2019-10-08 MED ORDER — AMLODIPINE BESYLATE 10 MG PO TABS
10.0000 mg | ORAL_TABLET | Freq: Every day | ORAL | Status: DC
  Administered 2019-10-08 – 2019-10-13 (×6): 10 mg via ORAL
  Filled 2019-10-08 (×6): qty 1

## 2019-10-08 NOTE — Evaluation (Signed)
Physical Therapy Evaluation Patient Details Name: PIERRE WEINSTOCK MRN: [id number] DOB: [birth year] Today's Date: 10/08/2019   History of Present Illness  69 year old female admitted on January 7 due to ARDS from Covid pneumonia requiring intubation and mechanical ventilation at [facility] for hypoxia and encephalopathy. Extubated 1/11; elevated BNP, troponin with ?CHF vs pulmonary edema vs myocarditis  PMH-HTN  Clinical Impression   Pt admitted with above diagnosis. 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 Normally completely independent with mobility with no balance issues. Currently required 6L O2 via HFNC and min- moderate assist for OOB to chair. Elevated BP (184/104) and lethargy precluded ambulation. RN aware and in to administer BP meds.  Pt currently with functional limitations due to the deficits listed below (see PT Problem List). Pt will benefit from skilled PT to increase their independence and safety with mobility to allow discharge to the venue listed below.       Follow Up Recommendations Home health PT    Equipment Recommendations  None recommended by PT    Recommendations for Other Services OT consult     Precautions / Restrictions Precautions Precautions: Fall Precaution Comments: elevated BP      Mobility  Bed Mobility Overal bed mobility: Needs Assistance Bed Mobility: Supine to Sit     Supine to sit: Min assist;HOB elevated(with rail)        Transfers Overall transfer level: Needs assistance Equipment used: Rolling walker (2 wheeled) Transfers: Sit to/from [facility] Sit to Stand: Mod assist Stand pivot transfers: Min assist       General transfer comment: once standing wiht RW able to take pivotal steps to recliner without imbalance  Ambulation/Gait             General Gait Details: deferred due to elevated BP (184/104 supine; 179/101 sitting)  Stairs            Wheelchair  Mobility    Modified Rankin (Stroke Patients Only)       Balance Overall balance assessment: Needs assistance Sitting-balance support: Single extremity supported;Feet unsupported Sitting balance-Leahy Scale: Poor Sitting balance - Comments: feet do not reach the floor at EOB   Standing balance support: Bilateral upper extremity supported Standing balance-Leahy Scale: Poor Standing balance comment: requires UE support                             Pertinent Vitals/Pain Pain Assessment: No/denies pain    Home Living Family/patient expects to be discharged to:: Private residence Living Arrangements: Spouse/significant other Available Help at Discharge: Family Type of Home: Mobile home Home Access: Stairs to enter Entrance Stairs-Rails: Right;Left;Can reach both Entrance Stairs-Number of Steps: 3 Home Layout: One level Home Equipment: Walker - 2 wheels;Cane - single point;Bedside commode Additional Comments: equipment was her mom's (mom was 5'2", she is 4'11")    Prior Function Level of Independence: Independent         Comments: works 4 days/week auto shop     Hand Dominance   Dominant Hand: Left    Extremity/Trunk Assessment   Upper Extremity Assessment Upper Extremity Assessment: Defer to OT evaluation    Lower Extremity Assessment Lower Extremity Assessment: Generalized weakness    Cervical / Trunk Assessment Cervical / Trunk Assessment: Other exceptions Cervical / Trunk Exceptions: obese  Communication   Communication: No difficulties  Cognition Arousal/Alertness: Lethargic Behavior During Therapy: Flat affect Overall Cognitive Status: Within Functional Limits for  tasks assessed                                 General Comments: cognition not specifically tested; conversationally and history appropriate      General Comments General comments (skin integrity, edema, etc.): Frequent eye closing throughout session; pulse ox  initially on her finger with sats 89% on 6L HFNC; switched to ear probe with sats 94% on 6L (and maintained throughout session    Exercises Other Exercises Other Exercises: educated in use of IS x 2 reps with pt pulling 500 ml; educated to complete 10 breaths/hour with 1-2 breaths at a time Other Exercises: educated in use of flutter valve; 10 breaths/hour, 1-2 at a time   Assessment/Plan    PT Assessment Patient needs continued PT services  PT Problem List Decreased strength;Decreased activity tolerance;Decreased balance;Decreased mobility;Decreased knowledge of use of DME;Cardiopulmonary status limiting activity;Obesity       PT Treatment Interventions DME instruction;Gait training;Functional mobility training;Therapeutic activities;Therapeutic exercise;Balance training;Patient/family education    PT Goals (Current goals can be found in the Care Plan section)  Acute Rehab PT Goals Patient Stated Goal: get better and go home PT Goal Formulation: With patient Time For Goal Achievement: 10/22/19 Potential to Achieve Goals: Good    Frequency Min 3X/week   Barriers to discharge        Co-evaluation               AM-PAC PT "6 Clicks" Mobility  Outcome Measure Help needed turning from your back to your side while in a flat bed without using bedrails?: A Little Help needed moving from lying on your back to sitting on the side of a flat bed without using bedrails?: A Lot Help needed moving to and from a bed to a chair (including a wheelchair)?: A Little Help needed standing up from a chair using your arms (e.g., wheelchair or bedside chair)?: A Lot Help needed to walk in hospital room?: Total Help needed climbing 3-5 steps with a railing? : Total 6 Click Score: 12    End of Session Equipment Utilized During Treatment: Oxygen Activity Tolerance: Patient tolerated treatment well Patient left: in chair;with call bell/phone within reach;with nursing/sitter in room(chair alarm  in place; nursing about to help pt bathe) Nurse Communication: Mobility status;Other (comment)(moved pulse ox to ear) PT Visit Diagnosis: Muscle weakness (generalized) (M62.81);Difficulty in walking, not elsewhere classified (R26.2)    Time: [id number] PT Time Calculation (min) (ACUTE ONLY): 36 min   Charges:   PT Evaluation $PT Eval Low Complexity: 1 Low PT Treatments $Therapeutic Activity: 8-22 mins         Arby Barrette, PT Pager [phone]   Rexanne Mano 10/08/2019, 11:29 AM

## 2019-10-08 NOTE — Evaluation (Signed)
Clinical/Bedside Swallow Evaluation Patient Details  Name: Kristina [name] MRN: [id number] Date of Birth: 07-23-1951  Today's Date: 10/08/2019 Time: SLP Start Time (ACUTE ONLY): 1451 SLP Stop Time (ACUTE ONLY): 1502 SLP Time Calculation (min) (ACUTE ONLY): 11 min  Past Medical History:  Past Medical History:  Diagnosis Date  . Hypertension    Past Surgical History:  Past Surgical History:  Procedure Laterality Date  . COLONOSCOPY N/A 12/21/2016   Procedure: COLONOSCOPY;  Surgeon: Rogene Houston, MD;  Location: [facility];  Service: Endoscopy;  Laterality: N/A;  930  . NO PAST SURGERIES     HPI:  69 year old female admitted on January 7 due to ARDS from Covid pneumonia requiring intubation and mechanical ventilation at [facility] for hypoxia and encephalopathy. ETT 1/7-11.  PMH-HTN   Assessment / Plan / Recommendation Clinical Impression  Pt participated in clinical swallow assessment. She was drowsy, with delayed and sometimes no response to questions, but voice/cough were strong.  Oral mechanism exam was normal. VS were stable and did not fluctuate during course of evaluation.  Pt tolerated ice chips, three oz water, purees, solids, and was taxed with mixed solid/liquid consistencies. Performance was consistent and swallow appeared to be [facility].  Recommend starting a regular solid diet/thin liquids; pt with BUE weakness and will need assistance initially with self-feeding.  No SLP f/u is needed - our service will sign off.  SLP Visit Diagnosis: Dysphagia, unspecified (R13.10)    Aspiration Risk  No limitations    Diet Recommendation   regular solids, thin liquids   Medication Administration: Whole meds with liquid    Other  Recommendations Oral Care Recommendations: Oral care BID   Follow up Recommendations None      Frequency and Duration            Prognosis        Swallow Study   General Date of Onset: 10/02/19 HPI: 69 year old female admitted on  January 7 due to ARDS from Covid pneumonia requiring intubation and mechanical ventilation at [facility] for hypoxia and encephalopathy. ETT 1/7-11.  PMH-HTN Type of Study: Bedside Swallow Evaluation Previous Swallow Assessment:  no Diet Prior to this Study: NPO Temperature Spikes Noted: No Respiratory Status: Other (comment)(6L HFNC) History of Recent Intubation: Yes Length of Intubations (days): 4 days Date extubated: 10/06/19 Behavior/Cognition: Alert;Cooperative;Lethargic/Drowsy Oral Cavity Assessment: Within Functional Limits Oral Care Completed by SLP: No Oral Cavity - Dentition: Adequate natural dentition Vision: Functional for self-feeding Self-Feeding Abilities: Needs assist Patient Positioning: Upright in bed Baseline Vocal Quality: Normal Volitional Cough: Strong Volitional Swallow: Able to elicit    Oral/Motor/Sensory Function Overall Oral Motor/Sensory Function: Within functional limits   Ice Chips Ice chips: Within functional limits   Thin Liquid Thin Liquid: Within functional limits    Nectar Thick Nectar Thick Liquid: Not tested   Honey Thick Honey Thick Liquid: Not tested   Puree Puree: Within functional limits   Solid     Solid: Within functional limits      Juan Quam Laurice 10/08/2019,3:05 PM  Estill Bamberg L. Tivis Ringer, [place] Office number [phone]

## 2019-10-08 NOTE — Progress Notes (Addendum)
PROGRESS NOTE    Kristina [name]  [id number] DOB: 1951/02/01 DOA: 10/02/2019 PCP: Jani Gravel, MD    Brief Narrative:  69 year old female who presented with dyspnea.  Patient was admitted on January 7 due to ARDS SARS COVID-19 viral pneumonia, she was placed on invasive mechanical ventilation at [facility].    She was transferred to same-day to [facility].  Apparently patient was found minimally responsive and profoundly hypoxemic, her oximetry was in the 80%s, and she required intubation.  Her chest radiograph had bilateral interstitial infiltrates upper lobes.  She received mechanical ventilation per ARDS net protocol, including proning. She received medical therapy with remdesivir and dexamethasone.  She was liberated from mechanical ventilation January 11.  Transferred to [facility] on 10/08/19.    Assessment & Plan:   Principal Problem:   Pneumonia due to COVID-19 virus Active Problems:   Acute respiratory distress syndrome (ARDS) due to COVID-19 virus [facility])   Essential hypertension   Uncontrolled diabetes mellitus ([place])   UTI (urinary tract infection)   Thrush   1.  Acute hypoxic respiratory failure due to SARS COVID-19 viral pneumonia.  RR: 20  Pulse oxymetry: 88 to 92%  Fi02: 6 L/min per HFNC.   COVID-19 Labs  Recent Labs    10/06/19 0440 10/06/19 0445 10/07/19 0521 10/07/19 0522  DDIMER 3.01*  --  3.49*  --   FERRITIN  --  254  --  245  CRP  --  5.2*  --  3.6*    Lab Results  Component Value Date   SARSCOV2NAA POSITIVE (A) 10/02/2019    Patient continue to be very deconditioned and dyspneic. Inflammatory markers continue to be elevated. Follow up chest film personally reviewed, noted bilateral interstitial infiltrates, right upper and left lower lobe.   She has completed antiviral therapy with Remdesivir #5/5, continue systemic steroids with dexamethasone (#7 steroids) and baricitinib. Antitussive agents and airway clearing techniques  with flutter valve and incentive spirometer. Physical therapy and occupational therapy. Had one dose of furosemide for non cardiogenic pulmonary edema.   Swallow evaluation and aspiration precautions.   Troponin elevation due to hypoxemic respiratory failure, ruled out acute coronary syndrome.   2. Urinary tract infection due to streptococcus, present on admission. Will continue antibiotic therapy with cefazolin.   3. HTN. Continue blood pressure control with IV metoprolol, pending swallow evaluation.   4. Uncontrolled T2DM (Hgb A1c 6.5) with steroid induced hyperglycemia. Continue glucose control with insulin sliding scale and basal insulin.   5. New oral thrush. Patient started on nystatin.    DVT prophylaxis: enoxaparin   Code Status:  full Family Communication: no family at the bedside.  Disposition Plan/ discharge barriers: pending improvement in hypoxemia, to less than 5 LPM per [place].    Nutrition Status: Nutrition Problem: Inadequate oral intake Etiology: acute illness Signs/Symptoms: NPO status Interventions: Tube feeding    Subjective: Patient continue to be very weak and deconditioned, positive dyspnea on exertion and minimal efforts. No nausea or vomiting.   Objective: Vitals:   10/08/19 0539 10/08/19 0608 10/08/19 0640 10/08/19 0746  BP: (!) 187/87 (!) 180/92 (!) 169/81 (!) 176/86  Pulse: 90 81 93 100  Resp:    20  Temp:    99 F (37.2 C)  TempSrc:    Axillary  SpO2:   92% (!) 88%  Weight:      Height:        Intake/Output Summary (Last 24 hours) at 10/08/2019 0830 Last data filed at  10/08/2019 0355 Gross per 24 hour  Intake 170 ml  Output 960 ml  Net -790 ml   Filed Weights   10/06/19 0416 10/07/19 0547 10/08/19 0500  Weight: 82 kg 79.7 kg 81.6 kg    Examination:   General: positive dyspnea, no pain  Neurology: Awake and alert, non focal  E ENT: positive pallor, no icterus, oral mucosa moist Cardiovascular: No JVD. S1-S2 present, rhythmic, no  gallops, rubs, or murmurs. No lower extremity edema. Pulmonary: positive breath sounds bilaterally. Gastrointestinal. Abdomen with no organomegaly, non tender, no rebound or guarding Skin. No rashes Musculoskeletal: no joint deformities     Data Reviewed: I have personally reviewed following labs and imaging studies  CBC: Recent Labs  Lab 10/03/19 0336 10/04/19 0500 10/04/19 1508 10/05/19 0330 10/06/19 0440 10/07/19 0521 10/08/19 0118  WBC 16.7* 7.5  --  13.6* 16.3* 20.3* 18.3*  NEUTROABS 14.3* 6.5  --  11.5* 14.0* 17.9*  --   HGB 12.1 17.1* 11.2* 11.5* 12.4 13.8 14.0  HCT 38.9 56.1* 33.0* 37.2 41.4 45.0 47.4*  MCV 90.0 90.8  --  92.3 92.6 93.2 93.5  PLT 403* 233  --  385 424* 487* [id number]*   Basic Metabolic Panel: Recent Labs  Lab 10/02/19 2008 10/03/19 0336 10/03/19 0336 10/03/19 1807 10/04/19 0500 10/04/19 1508 10/05/19 0330 10/06/19 0440 10/07/19 0521 10/08/19 0118  NA 139 140   < >  --  142 141 141 141 142 142  K 4.6 4.2   < >  --  3.9 3.6 4.3 4.3 4.3 4.0  CL 99 101  --   --  104  --  106 104 100 103  CO2 26 23  --   --  26  --  25 25 26 26   GLUCOSE 233* 235*  --   --  302*  --  312* 271* 130* 171*  BUN 52* 62*  --   --  92*  --  71* 43* 36* 38*  CREATININE 1.41* 1.49*  --   --  1.27*  --  0.91 0.68 0.58 0.63  CALCIUM 8.6* 8.4*  --   --  8.4*  --  8.4* 8.6* 9.0 9.0  MG 2.8* 2.7*  --  3.0* 3.1*  --   --   --   --   --   PHOS 3.7 3.4  --  4.0 5.0*  --   --   --   --   --    < > = values in this interval not displayed.   GFR: Estimated Creatinine Clearance: 63.1 mL/min (by C-G formula based on SCr of 0.63 mg/dL). Liver Function Tests: Recent Labs  Lab 10/03/19 0336 10/04/19 0500 10/05/19 0330 10/06/19 0440 10/07/19 0521  AST 58* 33 20 41 42*  ALT 65* 58* 47* 49* 53*  ALKPHOS 75 74 58 61 61  BILITOT 0.4 0.4 0.3 0.6 0.5  PROT 6.9 6.7 6.4* 6.6 6.6  ALBUMIN 2.9* 2.6* 2.6* 2.7* 2.8*   No results for input(s): LIPASE, AMYLASE in the last 168 hours. No  results for input(s): AMMONIA in the last 168 hours. Coagulation Profile: No results for input(s): INR, PROTIME in the last 168 hours. Cardiac Enzymes: No results for input(s): CKTOTAL, CKMB, CKMBINDEX, TROPONINI in the last 168 hours. BNP (last 3 results) No results for input(s): PROBNP in the last 8760 hours. HbA1C: No results for input(s): HGBA1C in the last 72 hours. CBG: Recent Labs  Lab 10/07/19 1257 10/07/19 1716 10/07/19 1956 10/08/19 0033  10/08/19 0350  GLUCAP 149* 153* 140* 172* 162*   Lipid Profile: Recent Labs    10/06/19 0440  TRIG 427*   Thyroid Function Tests: No results for input(s): TSH, T4TOTAL, FREET4, T3FREE, THYROIDAB in the last 72 hours. Anemia Panel: Recent Labs    10/06/19 0445 10/07/19 0522  FERRITIN 254 245      Radiology Studies: I have reviewed all of the imaging during this hospital visit personally     Scheduled Meds: . baricitinib  4 mg Oral Daily  . chlorhexidine  15 mL Mouth/Throat BID  . Chlorhexidine Gluconate Cloth  6 each Topical Daily  . dexamethasone (DECADRON) injection  6 mg Intravenous Q24H  . enoxaparin (LOVENOX) injection  0.5 mg/kg Subcutaneous Q12H  . influenza vaccine adjuvanted  0.5 mL Intramuscular Tomorrow-1000  . insulin aspart  0-15 Units Subcutaneous Q4H  . insulin detemir  12 Units Subcutaneous Daily  . mouth rinse  15 mL Mouth Rinse 10 times per day  . metoprolol tartrate  7.5 mg Intravenous Q6H  . pantoprazole (PROTONIX) IV  40 mg Intravenous QHS  . sodium chloride flush  10-40 mL Intracatheter Q12H   Continuous Infusions: . sodium chloride Stopped (10/03/19 1652)  .  ceFAZolin (ANCEF) IV 1 g (10/08/19 0537)     LOS: 6 days        Kyndel Egger Gerome Apley, MD

## 2019-10-08 NOTE — Progress Notes (Signed)
Patient fell. Patient was toileting on BSC. I handed the patient the call light asked the patient to call me when she finished so I could give her some privacy. Handed my phone to [facility] in the patient's room so I could go get some water. When I came back, Shaquan had found the patient lying on the ground. Says she had leaned forward to wipe and fell. Claims she hit her head, but no pain and I do not see any injury bruising/swelling etc. Notified the attending who messaged [place] "to continue monitoring". Will notify family. Completed post fall flowsheet, fall form with charge nurse, and will fill out safety zone report. Patient A/O x 4, no neuro change, no pain; report given to bedside nurse who is assessing patient now and aware of fall. Will continue care.

## 2019-10-08 NOTE — Plan of Care (Signed)

## 2019-10-08 NOTE — Progress Notes (Signed)
Messaged dr Cathlean Sauer earlier today; will hold PO meds until speech eval has been completed.

## 2019-10-09 LAB — COMPREHENSIVE METABOLIC PANEL
ALT: 49 U/L — ABNORMAL HIGH (ref 0–44)
AST: 27 U/L (ref 15–41)
Albumin: 2.8 g/dL — ABNORMAL LOW (ref 3.5–5.0)
Alkaline Phosphatase: 55 U/L (ref 38–126)
Anion gap: 11 (ref 5–15)
BUN: 33 mg/dL — ABNORMAL HIGH (ref 8–23)
CO2: 27 mmol/L (ref 22–32)
Calcium: 8.8 mg/dL — ABNORMAL LOW (ref 8.9–10.3)
Chloride: 98 mmol/L (ref 98–111)
Creatinine, Ser: 0.51 mg/dL (ref 0.44–1.00)
GFR calc Af Amer: >60 mL/min (ref >60)
GFR calc non Af Amer: >60 mL/min (ref >60)
Glucose, Bld: 159 mg/dL — ABNORMAL HIGH (ref 70–99)
Potassium: 3.5 mmol/L (ref 3.5–5.1)
Sodium: 136 mmol/L (ref 135–145)
Total Bilirubin: 0.7 mg/dL (ref 0.3–1.2)
Total Protein: 6.1 g/dL — ABNORMAL LOW (ref 6.5–8.1)

## 2019-10-09 LAB — FERRITIN: Ferritin: 243 ng/mL (ref 11–307)

## 2019-10-09 LAB — C-REACTIVE PROTEIN: CRP: 1.5 mg/dL — ABNORMAL HIGH (ref <1.0)

## 2019-10-09 LAB — D-DIMER, QUANTITATIVE: D-Dimer, Quant: 7.1 ug/mL-FEU — ABNORMAL HIGH (ref 0.00–0.50)

## 2019-10-09 LAB — GLUCOSE, CAPILLARY
Glucose-Capillary: 134 mg/dL — ABNORMAL HIGH (ref 70–99)
Glucose-Capillary: 161 mg/dL — ABNORMAL HIGH (ref 70–99)
Glucose-Capillary: 166 mg/dL — ABNORMAL HIGH (ref 70–99)
Glucose-Capillary: 172 mg/dL — ABNORMAL HIGH (ref 70–99)
Glucose-Capillary: 204 mg/dL — ABNORMAL HIGH (ref 70–99)

## 2019-10-09 MED ORDER — ENSURE ENLIVE PO LIQD
237.0000 mL | Freq: Three times a day (TID) | ORAL | Status: DC
  Administered 2019-10-09 – 2019-10-12 (×7): 237 mL via ORAL

## 2019-10-09 NOTE — Plan of Care (Signed)

## 2019-10-09 NOTE — Progress Notes (Signed)
Inpatient Rehabilitation-Admissions Coordinator   CIR consult received. It is noted pt was COVID + 1/7. Based on current criteria, pt will need to have two negative tests or be at least 20 days post initial diagnosis (~1/27) prior to admit to CIR. Will continue to follow pt progress while awaiting completion of infectious period. If pt continues to require IP Rehab services at that time, [facility] will complete further evaluation.   Raechel Ache, OTR/L  Rehab Admissions Coordinator  [phone] 10/09/2019 2:51 PM

## 2019-10-09 NOTE — Progress Notes (Signed)
PROGRESS NOTE    Kristina [name]  [id number] DOB: 03-07-1951 DOA: 10/02/2019 PCP: Jani Gravel, MD    Brief Narrative:  69 year old female who presented with dyspnea.  Patient was admitted on January 7 due to ARDS SARS COVID-19 viral pneumonia, she was placed on invasive mechanical ventilation at [facility].    She was transferred to same-day to [facility].  Apparently patient was found minimally responsive and profoundly hypoxemic, her oximetry was in the 80%s, and she required intubation.  Her chest radiograph had bilateral interstitial infiltrates upper lobes.  She received mechanical ventilation per ARDS net protocol, including proning. She received medical therapy with remdesivir and dexamethasone.  She was liberated from mechanical ventilation January 11.  Transferred to [facility] on 10/08/19  Troponin elevation due to hypoxemic respiratory failure, ruled out acute coronary syndrome.   Assessment & Plan:   Principal Problem:   Pneumonia due to COVID-19 virus Active Problems:   Acute respiratory distress syndrome (ARDS) due to COVID-19 virus [facility])   Essential hypertension   Uncontrolled diabetes mellitus ([place])   UTI (urinary tract infection)   Thrush   1.  Acute hypoxic respiratory failure due to SARS COVID-19 viral pneumonia. Sp remdesivir #5/5/ diuresis/   RR: 18  Pulse oxymetry: 95%  Fi02: 21% room air.  COVID-19 Labs  Recent Labs    10/07/19 0521 10/07/19 0522 10/09/19 0059  DDIMER 3.49*  --  7.10*  FERRITIN  --  245 243  CRP  --  3.6* 1.5*    Lab Results  Component Value Date   SARSCOV2NAA POSITIVE (A) 10/02/2019    Inflammatory markers are trending down, except for d dimer.   Tolerating well medical therapy with systemic corticosteroids with dexamethasone (#8 steroids) and baricitinib #7/14 . Continue with antitussive agents and airway clearing techniques with flutter valve and incentive spirometer. Physical therapy and occupational  therapy..   Out of bed to chair tid with meal, physical and occupational therapy evaluation.    2. Urinary tract infection due to streptococcus, present on admission. WBC down to 18 from 20, urine culture with 70,000 CFU of group B streptococcus (Agalactiea) with positive pyuria. Patient completed antibiotic therapy with cefazolin.   3. HTN. Blood pressure 148 to [id number] mmHg systolic, will continue amlodipine and carvedilol for blood pressure control.   4. T2DM (Hgb A1c 6.5) with steroid induced hyperglycemia. On insulin sliding scale and basal insulin for glucose control. Patient is tolerating po well.  5. New oral thrush. Continue with nystatin.    DVT prophylaxis: enoxaparin   Code Status:  full Family Communication: no family at the bedside.  Disposition Plan/ discharge barriers: Possible dc in am home with home services if patient continue to have low oxygen requirements.    Subjective: Patient is feeling better, but still continue to have dyspnea with minimal efforts, no nausea or vomiting, poor appetite. No chest pain.   Objective: Vitals:   10/08/19 2324 10/09/19 0403 10/09/19 0426 10/09/19 0705  BP: (!) 157/99 (!) 159/92  (!) 148/79  Pulse: 100 100  96  Resp: 20 19  18   Temp: 97.7 F (36.5 C) 98.7 F (37.1 C)  98.1 F (36.7 C)  TempSrc: Oral Oral  Oral  SpO2: 95% 96%  95%  Weight:   80.9 kg   Height:        Intake/Output Summary (Last 24 hours) at 10/09/2019 0920 Last data filed at 10/09/2019 0400 Gross per 24 hour  Intake 450 ml  Output 653  ml  Net -203 ml   Filed Weights   10/07/19 0547 10/08/19 0500 10/09/19 0426  Weight: 79.7 kg 81.6 kg 80.9 kg    Examination:   General: positive dyspnea and deconditioning Neurology: Awake and alert, non focal  E ENT: mild pallor, no icterus, oral mucosa moist Cardiovascular: No JVD. S1-S2 present, rhythmic, no gallops, rubs, or murmurs. No lower extremity edema. Pulmonary: positive breath sounds  bilaterally. Gastrointestinal. Abdomen with no organomegaly, non tender, no rebound or guarding Skin. No rashes Musculoskeletal: no joint deformities     Data Reviewed: I have personally reviewed following labs and imaging studies  CBC: Recent Labs  Lab 10/03/19 0336 10/03/19 1555 10/04/19 0500 10/04/19 0500 10/04/19 1508 10/05/19 0330 10/06/19 0440 10/07/19 0521 10/08/19 0118  WBC 16.7*  --  7.5  --   --  13.6* 16.3* 20.3* 18.3*  NEUTROABS 14.3*  --  6.5  --   --  11.5* 14.0* 17.9*  --   HGB 12.1   < > 17.1*   < > 11.2* 11.5* 12.4 13.8 14.0  HCT 38.9   < > 56.1*   < > 33.0* 37.2 41.4 45.0 47.4*  MCV 90.0  --  90.8  --   --  92.3 92.6 93.2 93.5  PLT 403*  --  233  --   --  385 424* 487* 433*   < > = values in this interval not displayed.   Basic Metabolic Panel: Recent Labs  Lab 10/02/19 2008 10/02/19 2008 10/03/19 [id number] 10/03/19 0336 10/03/19 1555 10/03/19 1807 10/04/19 0500 10/04/19 1508 10/05/19 0330 10/06/19 0440 10/07/19 0521 10/08/19 0118 10/09/19 0059  NA 139   < > 140   < >   < >  --  142   < > 141 141 142 142 136  K 4.6   < > 4.2   < >   < >  --  3.9   < > 4.3 4.3 4.3 4.0 3.5  CL 99   < > 101   < >  --   --  104  --  106 104 100 103 98  CO2 26   < > 23   < >  --   --  26  --  25 25 26 26 27   GLUCOSE 233*   < > 235*   < >  --   --  302*  --  312* 271* 130* 171* 159*  BUN 52*   < > 62*   < >  --   --  92*  --  71* 43* 36* 38* 33*  CREATININE 1.41*   < > 1.49*   < >  --   --  1.27*  --  0.91 0.68 0.58 0.63 0.51  CALCIUM 8.6*   < > 8.4*   < >  --   --  8.4*  --  8.4* 8.6* 9.0 9.0 8.8*  MG 2.8*  --  2.7*  --   --  3.0* 3.1*  --   --   --   --   --   --   PHOS 3.7  --  3.4  --   --  4.0 5.0*  --   --   --   --   --   --    < > = values in this interval not displayed.   GFR: Estimated Creatinine Clearance: 62.8 mL/min (by C-G formula based on SCr of 0.51 mg/dL). Liver Function Tests: Recent Labs  Lab 10/04/19 0500 10/05/19 0330 10/06/19 0440  10/07/19 0521 10/09/19 0059  AST 33 20 41 42* 27  ALT 58* 47* 49* 53* 49*  ALKPHOS 74 58 61 61 55  BILITOT 0.4 0.3 0.6 0.5 0.7  PROT 6.7 6.4* 6.6 6.6 6.1*  ALBUMIN 2.6* 2.6* 2.7* 2.8* 2.8*   No results for input(s): LIPASE, AMYLASE in the last 168 hours. No results for input(s): AMMONIA in the last 168 hours. Coagulation Profile: No results for input(s): INR, PROTIME in the last 168 hours. Cardiac Enzymes: No results for input(s): CKTOTAL, CKMB, CKMBINDEX, TROPONINI in the last 168 hours. BNP (last 3 results) No results for input(s): PROBNP in the last 8760 hours. HbA1C: No results for input(s): HGBA1C in the last 72 hours. CBG: Recent Labs  Lab 10/08/19 1610 10/08/19 1945 10/08/19 2341 10/09/19 0408 10/09/19 0703  GLUCAP 192* 185* 166* 172* 134*   Lipid Profile: No results for input(s): CHOL, HDL, LDLCALC, TRIG, CHOLHDL, LDLDIRECT in the last 72 hours. Thyroid Function Tests: No results for input(s): TSH, T4TOTAL, FREET4, T3FREE, THYROIDAB in the last 72 hours. Anemia Panel: Recent Labs    10/07/19 0522 10/09/19 0059  [id number]      Radiology Studies: I have reviewed all of the imaging during this hospital visit personally     Scheduled Meds: . amLODipine  10 mg Oral Daily  . baricitinib  4 mg Oral Daily  . carvedilol  3.125 mg Oral BID WC  . chlorhexidine  15 mL Mouth/Throat BID  . Chlorhexidine Gluconate Cloth  6 each Topical Daily  . dexamethasone (DECADRON) injection  6 mg Intravenous Q24H  . enoxaparin (LOVENOX) injection  0.5 mg/kg Subcutaneous Q12H  . influenza vaccine adjuvanted  0.5 mL Intramuscular Tomorrow-1000  . insulin aspart  0-15 Units Subcutaneous Q4H  . insulin detemir  12 Units Subcutaneous Daily  . nystatin  5 mL Oral QID  . pantoprazole (PROTONIX) IV  40 mg Intravenous QHS  . sodium chloride flush  10-40 mL Intracatheter Q12H   Continuous Infusions: . sodium chloride Stopped (10/03/19 1652)     LOS: 7 days         Tylene Quashie Gerome Apley, MD

## 2019-10-09 NOTE — Plan of Care (Signed)
Pt on 2L [place] at this time. Was tolerating RA until she feel asleep and began to desat. Still tired and weak. Fall precautions in place, call bell within reach and pt encouraged to call for assistance before trying to get out of bed.  Problem: Education: Goal: Knowledge of risk factors and measures for prevention of condition will improve Outcome: Progressing   Problem: Coping: Goal: Psychosocial and spiritual needs will be supported Outcome: Progressing   Problem: Respiratory: Goal: Will maintain a patent airway Outcome: Progressing Goal: Complications related to the disease process, condition or treatment will be avoided or minimized Outcome: Progressing   Problem: Clinical Measurements: Goal: Ability to maintain clinical measurements within normal limits will improve Outcome: Progressing Goal: Will remain free from infection Outcome: Progressing Goal: Diagnostic test results will improve Outcome: Progressing Goal: Respiratory complications will improve Outcome: Progressing Goal: Cardiovascular complication will be avoided Outcome: Progressing   Problem: Nutrition: Goal: Adequate nutrition will be maintained Outcome: Progressing   Problem: Safety: Goal: Ability to remain free from injury will improve Outcome: Progressing

## 2019-10-09 NOTE — Progress Notes (Signed)
Occupational Therapy Evaluation Patient Details Name: Kristina [name] MRN: [id number] DOB: 1951/04/20 Today's Date: 10/09/2019    History of Present Illness 69 year old female admitted on January 7 due to ARDS from Covid pneumonia requiring intubation and mechanical ventilation at [facility] for hypoxia and encephalopathy. Extubated 1/11; elevated BNP, troponin with ?CHF vs pulmonary edema vs myocarditis  PMH-HTN   Clinical Impression   PTA pt lived with her husband, independent in ADL, IADL, and mobility tasks. Pt does not ambulate with an assistive device and reports 0 falls in the last 6 months. Pt does not use oxygen at home and is currently on 2.5L [place]. Pt currently requires setup to mod assist for self-care and functional transfer tasks. Pt tolerated standing 1 x 1 min and 2 x 2 min to transfer to/from bedside commode and complete peri care. Pt very unsteady on feet, noting 1 instance of loss of balance during commode transfer with pt requiring assist to self-correct. SpO2 decreased to 89% with activity, noting quick return back to 90s following seated rest break. Pt reports min shortness of breath throughout. Educated and provided pt with handout in regards to energy conservation. Pt demonstrates decreased strength, endurance, balance, standing tolerance, and activity tolerance impacting ability to complete self-care and functional transfer tasks. Recommend skilled OT services to address above deficits in order to promote function and prevent further decline. Pt would benefit from additional rehab prior to discharge home.     Follow Up Recommendations  CIR    Equipment Recommendations  Other (comment)(TBD based on pt's progress in therapy)    Recommendations for Other Services       Precautions / Restrictions Precautions Precautions: Fall Restrictions Weight Bearing Restrictions: No      Mobility Bed Mobility               General bed mobility comments: Pt seated  in bedside chair upon OT arrival  Transfers Overall transfer level: Needs assistance Equipment used: Rolling walker (2 wheeled) Transfers: Sit to/from [facility] Sit to Stand: Min assist;Mod assist Stand pivot transfers: Mod assist       General transfer comment: Initially mod assist for sit/stand progressing to min assist. Pt very unsteady on feet. Noted 1 instance of LOB during stand pivot to [facility] with pt requiring assist to self-correct.    Balance Overall balance assessment: Needs assistance Sitting-balance support: Feet supported Sitting balance-Leahy Scale: Fair       Standing balance-Leahy Scale: Poor                             ADL either performed or assessed with clinical judgement   ADL Overall ADL's : Needs assistance/impaired Eating/Feeding: Independent;Sitting   Grooming: Set up;Supervision/safety;Sitting   Upper Body Bathing: Set up;Supervision/ safety;Sitting   Lower Body Bathing: Moderate assistance;Sit to/from stand;Sitting/lateral leans   Upper Body Dressing : Set up;Supervision/safety;Sitting   Lower Body Dressing: Moderate assistance;Sit to/from stand;Sitting/lateral leans   Toilet Transfer: Moderate assistance;BSC;Stand-pivot   Toileting- Clothing Manipulation and Hygiene: Moderate assistance;Sit to/from stand       Functional mobility during ADLs: Moderate assistance;Rolling walker General ADL Comments: Pt able to stand pivot to/from [facility] from bedside chair. Pt very unsteady on feet. Noted 1 instance of LOB.      Vision Baseline Vision/History: Wears glasses Wears Glasses: At all times(Does not have them here)       Perception     Praxis  Pertinent Vitals/Pain Pain Assessment: No/denies pain     Hand Dominance Left   Extremity/Trunk Assessment Upper Extremity Assessment Upper Extremity Assessment: Generalized weakness   Lower Extremity Assessment Lower Extremity Assessment: Defer to PT  evaluation       Communication Communication Communication: No difficulties   Cognition Arousal/Alertness: Lethargic Behavior During Therapy: WFL for tasks assessed/performed Overall Cognitive Status: Within Functional Limits for tasks assessed                                     General Comments  Pt on 2.5L [place] with SpO2 decreasing to 89% during activity. Quick return back to 90s with seated rest break.     Exercises Exercises: Other exercises Other Exercises Other Exercises: Pursed lip breathing throughout with mod cues on technique.   Shoulder Instructions      Home Living Family/patient expects to be discharged to:: Private residence Living Arrangements: Spouse/significant other Available Help at Discharge: Family Type of Home: Mobile home Home Access: Stairs to enter Entrance Stairs-Number of Steps: 3 Entrance Stairs-Rails: Right;Left;Can reach both Home Layout: One level     Bathroom Shower/Tub: Occupational psychologist: [place]: Environmental consultant - 2 wheels;Cane - single point;Bedside commode          Prior Functioning/Environment Level of Independence: Independent        Comments: Pt independent in ADLs, IADLs, and mobility. Pt does not ambulate with an assistive device and reports 0 falls at home. 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 Pt does not use oxygen at home.         OT Problem List: Decreased strength;Decreased activity tolerance;Impaired balance (sitting and/or standing);Cardiopulmonary status limiting activity      OT Treatment/Interventions: Self-care/ADL training;Therapeutic exercise;Neuromuscular education;Energy conservation;DME and/or AE instruction;Therapeutic activities;Patient/family education;Balance training    OT Goals(Current goals can be found in the care plan section) Acute Rehab OT Goals Patient Stated Goal: to go home Time For Goal Achievement: 10/23/19 ADL Goals Pt Will Perform  Grooming: with set-up;standing Pt Will Perform Lower Body Bathing: with set-up;sit to/from stand Pt Will Perform Lower Body Dressing: with set-up;sit to/from stand Pt Will Transfer to Toilet: with min assist;regular height toilet;ambulating Pt Will Perform Toileting - Clothing Manipulation and hygiene: with set-up;sit to/from stand Additional ADL Goal #1: Pt to recall and verbalize 3 energy conservation techniques with 0 verbal cues. Additional ADL Goal #2: Pt to tolerate standing up to 5 min with supervision, in preparation for ADLs.  OT Frequency: Min 3X/week   Barriers to D/C:            Co-evaluation              AM-PAC OT "6 Clicks" Daily Activity     Outcome Measure Help from another person eating meals?: A Little Help from another person taking care of personal grooming?: A Little Help from another person toileting, which includes using toliet, bedpan, or urinal?: A Lot Help from another person bathing (including washing, rinsing, drying)?: A Lot Help from another person to put on and taking off regular upper body clothing?: A Little Help from another person to put on and taking off regular lower body clothing?: A Lot 6 Click Score: 15   End of Session Equipment Utilized During Treatment: Rolling walker;Oxygen Nurse Communication: Mobility status  Activity Tolerance: Patient limited by fatigue;Patient limited by lethargy(Limited by SOB)  Patient left: in chair;with call bell/phone within reach;with chair alarm set;with nursing/sitter in room  OT Visit Diagnosis: Unsteadiness on feet (R26.81);Muscle weakness (generalized) (M62.81)                Time: [id number] OT Time Calculation (min): 29 min Charges:  OT General Charges $OT Visit: 1 Visit OT Evaluation $OT Eval Moderate Complexity: 1 Mod OT Treatments $Self Care/Home Management : 8-22 mins  Mauri Brooklyn OTR/L [phone]   Mauri Brooklyn 10/09/2019, 11:20 AM

## 2019-10-09 NOTE — Progress Notes (Signed)
 Nutrition Follow-up  DOCUMENTATION CODES:   Not applicable  INTERVENTION:   Ensure Enlive po TID, each supplement provides 350 kcal and 20 grams of protein  Pt receiving Hormel Shake daily with Breakfast which provides 520 kcals and 22 g of protein and Magic cup BID with lunch and dinner, each supplement provides 290 kcal and 9 grams of protein, automatically on meal trays to optimize nutritional intake.    NUTRITION DIAGNOSIS:   Inadequate oral intake related to acute illness as evidenced by NPO status.  Being addressed as diet advanced, supplements  GOAL:   Patient will meet greater than or equal to 90% of their needs  Progressing  MONITOR:   Vent status, Skin, TF tolerance, Weight trends, Labs  REASON FOR ASSESSMENT:   Consult Enteral/tube feeding initiation and management  ASSESSMENT:   69 yo female admitted with ARDS secondary to [place] pneumonia requiring intubation. PMH inculdes HTN  01/07 Admit, intubated 01/08 TF initiated, pt proned 01/11 Extubated 01/13 Diet advanced to Regular  No recorded po intake. Spoke with RN who indicates pt is eating around 25% of meals. Pt ate very little yesterday and hardly touched breakfast tray this AM. RN indicating that pt states she is not a"morning eater." Pt is also very tired/fatigued which is contributing to her poor po intake.   Current weight 80.9 kg; admit weight 81.6 kg. Lowest weight since admission 77.2 kg on 1/09. Plan to utilize this as dry wt at present  Labs: CBGs 134-192 (goal 140-180) Meds: decadron, ss novolog, levemir  Diet Order:   Diet Order            Diet regular Room service appropriate? Yes with Assist; Fluid consistency: Thin  Diet effective now              EDUCATION NEEDS:   Not appropriate for education at this time  Skin:  Skin Assessment: Reviewed RN Assessment  Last BM:  1/12  Height:   Ht Readings from Last 1 Encounters:  10/07/19 4\' 11"  (1.499 m)    Weight:   Wt  Readings from Last 1 Encounters:  10/09/19 80.9 kg    Ideal Body Weight:  54.6 kg  BMI:  Body mass index is 36.02 kg/m.  Estimated Nutritional Needs:   Kcal:  1720-1940 kcals  Protein:  86-97 g  Fluid:  >/= 1.8 L    Mittie Knittel MS, RDN, LDN, CNSC [phone] Pager  [phone] Weekend/On-Call Pager

## 2019-10-09 NOTE — Progress Notes (Signed)
Rehab Admissions Coordinator Note:  Patient was screened by Cleatrice Burke for appropriateness for an Inpatient Acute Rehab Consult per OT recs. At this time, we are recommending Inpatient Rehab consult. I will place order for follow rehab assessment. Noted COVID + 1/7.  Cleatrice Burke RN MSN 10/09/2019, 11:34 AM  I can be reached at [phone].

## 2019-10-10 ENCOUNTER — Inpatient Hospital Stay (HOSPITAL_COMMUNITY): Payer: Medicare HMO

## 2019-10-10 LAB — COMPREHENSIVE METABOLIC PANEL
ALT: 43 U/L (ref 0–44)
AST: 25 U/L (ref 15–41)
Albumin: 2.8 g/dL — ABNORMAL LOW (ref 3.5–5.0)
Alkaline Phosphatase: 52 U/L (ref 38–126)
Anion gap: 11 (ref 5–15)
BUN: 34 mg/dL — ABNORMAL HIGH (ref 8–23)
CO2: 24 mmol/L (ref 22–32)
Calcium: 8.9 mg/dL (ref 8.9–10.3)
Chloride: 95 mmol/L — ABNORMAL LOW (ref 98–111)
Creatinine, Ser: 0.51 mg/dL (ref 0.44–1.00)
GFR calc Af Amer: >60 mL/min (ref >60)
GFR calc non Af Amer: >60 mL/min (ref >60)
Glucose, Bld: 184 mg/dL — ABNORMAL HIGH (ref 70–99)
Potassium: 3.9 mmol/L (ref 3.5–5.1)
Sodium: 130 mmol/L — ABNORMAL LOW (ref 135–145)
Total Bilirubin: 0.7 mg/dL (ref 0.3–1.2)
Total Protein: 6.1 g/dL — ABNORMAL LOW (ref 6.5–8.1)

## 2019-10-10 LAB — GLUCOSE, CAPILLARY
Glucose-Capillary: 148 mg/dL — ABNORMAL HIGH (ref 70–99)
Glucose-Capillary: 148 mg/dL — ABNORMAL HIGH (ref 70–99)
Glucose-Capillary: 153 mg/dL — ABNORMAL HIGH (ref 70–99)
Glucose-Capillary: 153 mg/dL — ABNORMAL HIGH (ref 70–99)
Glucose-Capillary: 154 mg/dL — ABNORMAL HIGH (ref 70–99)
Glucose-Capillary: 165 mg/dL — ABNORMAL HIGH (ref 70–99)
Glucose-Capillary: 175 mg/dL — ABNORMAL HIGH (ref 70–99)
Glucose-Capillary: 202 mg/dL — ABNORMAL HIGH (ref 70–99)

## 2019-10-10 LAB — FERRITIN: Ferritin: 322 ng/mL — ABNORMAL HIGH (ref 11–307)

## 2019-10-10 LAB — D-DIMER, QUANTITATIVE: D-Dimer, Quant: 8.96 ug/mL-FEU — ABNORMAL HIGH (ref 0.00–0.50)

## 2019-10-10 LAB — C-REACTIVE PROTEIN: CRP: 0.7 mg/dL (ref <1.0)

## 2019-10-10 MED ORDER — PANTOPRAZOLE SODIUM 40 MG PO TBEC
40.0000 mg | DELAYED_RELEASE_TABLET | Freq: Every day | ORAL | Status: DC
  Administered 2019-10-10 – 2019-10-12 (×3): 40 mg via ORAL
  Filled 2019-10-10 (×3): qty 1

## 2019-10-10 MED ORDER — NYSTATIN 100000 UNIT/ML MT SUSP: 5.0000 mL | Freq: Four times a day (QID) | OROMUCOSAL | 0 refills | Status: AC

## 2019-10-10 MED ORDER — ENOXAPARIN SODIUM 40 MG/0.4ML ~~LOC~~ SOLN: 40.0000 mg | SUBCUTANEOUS | 0 refills | Status: DC

## 2019-10-10 MED ORDER — IOHEXOL 350 MG/ML SOLN
75.0000 mL | Freq: Once | INTRAVENOUS | Status: AC | PRN
  Administered 2019-10-10: 75 mL via INTRAVENOUS

## 2019-10-10 NOTE — Progress Notes (Signed)
SATURATION QUALIFICATIONS: (This note is used to comply with regulatory documentation for home oxygen)  Patient Saturations on Room Air at Rest = 96%  Patient Saturations on Room Air while Ambulating = 87%  Patient Saturations on 2 Liters of oxygen while Ambulating = 94%  Please briefly explain why patient needs home oxygen:To maintain saturation > 88% while ambulating x 6 minutes.  [place] Pager [phone] Office [phone]

## 2019-10-10 NOTE — Progress Notes (Addendum)
PROGRESS NOTE    Kristina [name]  [id number] DOB: 10/07/1950 DOA: 10/02/2019 PCP: Jani Gravel, MD    Brief Narrative:  69 year old female who presented with dyspnea. Patient was admitted onJanuary 7 due to ARDS SARS COVID-19 viral pneumonia, she was placed on invasive mechanical ventilation at [facility].   She was transferred to same-day to [facility] Apparently patient was found minimally responsive and profoundly hypoxemic,her oximetry was in the 80%s,and she required intubation. Her lungs were clear to auscultation bilaterally, heart S1-S2 present, rhythmic, no gallops, posterior murmurs, her abdomen was soft, no lower extremity edema.   Sodium 138, potassium 4.6, chloride 97, bicarb 24, glucose 159, BUN 40, creatinine 1.18, white count 23.5, hemoglobin 14.0, hematocrit 46.1, platelets 398.  Urinalysis had 30 protein, specific gravity 1.01, 6-10 red cells, more than 50 white cells.  Her chest radiograph had bilateral interstitial infiltrates upper lobes.  Her EKG had 130 bpm, normal axis, normal intervals, sinus rhythm, no ST segment or T wave changes.  She received mechanical ventilation per ARDS net protocol, including proning. Shereceived medical therapy with remdesivir, baricitinib and dexamethasone. She was liberated from mechanical ventilation January 11.  Transferred to [facility] on 10/08/19  Troponin elevation due to hypoxemic respiratory failure, ruled out acute coronary syndrome   Assessment & Plan:   Principal Problem:   Pneumonia due to COVID-19 virus Active Problems:   Acute respiratory distress syndrome (ARDS) due to COVID-19 virus [facility])   Essential hypertension   Uncontrolled diabetes mellitus ([place])   UTI (urinary tract infection)   Thrush   1.  Acute hypoxic respiratory failure due to SARS COVID-19 viral pneumonia.  Patient was admitted to the intensive care unit, she received invasive mechanical ventilation per ARDS net protocol  including chronic pulsation.  Medical therapy with remdesivir, dexamethasone and baricitinib.  She was successfully liberated from mechanical ventilation January 11.  Her respiratory failure along with inflammatory markers and symptoms slowly improved.  COVID-19 Labs  Recent Labs    10/09/19 0059 10/10/19 0527  DDIMER 7.10* 8.96*  FERRITIN 243 322*  CRP 1.5* 0.7    Lab Results  Component Value Date   SARSCOV2NAA POSITIVE (A) 10/02/2019     Patient had decreased mobility, physical therapy recommended home health services.  Her oximetry at discharge is 97% on 2 L per nasal cannula.  She had persistent elevated D-dimer, further work-up with CT chest PE protocol was performed.  Patient was seen by physical therapy, recommendations for home health services.  Patient will have to have negative Covid testing in order to be considered for inpatient rehab.  Home O2 screen will be performed before discharge.  2.  Urine tract infection due to Streptococcus, present on admission.  Received antibiotic therapy with cefazolin with no major complications.  3.  Hypertension.  Initially antihypertensive agents were held as patient improved she required amlodipine and carvedilol for further blood pressure control.  4.  Type 2 diabetes mellitus (hemoglobin 123456 6.5) complicated by steroid-induced hyperglycemia.  Patient received insulin therapy per sliding scale for glucose coverage and monitoring.  5.  Oral thrush.  Patient was placed on nystatin, to complete course of 10 days.   DVT prophylaxis: enoxaparin   Code Status:  full Family Communication: I spoke over the phone with the patient's daughter  about patient's  condition, plan of care and all questions were addressed. Disposition Plan/ discharge barriers: patient clinically improved but still very weak and deconditioned, unable to get home health until Monday. Patient  will need home 02 and prophylactic anticoagulation. Will plan to dc  on Monday home if continue to improve clinically.     Subjective: Patient is feeling better but continue to have dyspnea on exertion and significant deconditioning. No nausea or vomiting.   Objective: Vitals:   10/10/19 0330 10/10/19 0444 10/10/19 0721 10/10/19 1159  BP: (!) 147/83  (!) 159/91 140/88  Pulse: 93  95 89  Resp: (!) 22  20 (!) 22  Temp: 98 F (36.7 C)  98 F (36.7 C) 98.2 F (36.8 C)  TempSrc: Oral  Oral Oral  SpO2: 95%  97% 100%  Weight:  82.5 kg    Height:        Intake/Output Summary (Last 24 hours) at 10/10/2019 1527 Last data filed at 10/10/2019 0500 Gross per 24 hour  Intake 335 ml  Output 0 ml  Net 335 ml   Filed Weights   10/08/19 0500 10/09/19 0426 10/10/19 0444  Weight: 81.6 kg 80.9 kg 82.5 kg    Examination:   General: Not in pain, positive resting dyspnea, deconditioned Neurology: Awake and alert, non focal  E ENT: mild pallor, no icterus, oral mucosa moist Cardiovascular: No JVD. S1-S2 present, rhythmic. No lower extremity edema. Pulmonary: positive breath sounds bilaterally. Gastrointestinal. Abdomen with no organomegaly, non tender, no rebound or guarding Skin. No rashes Musculoskeletal: no joint deformities     Data Reviewed: I have personally reviewed following labs and imaging studies  CBC: Recent Labs  Lab 10/04/19 0500 10/04/19 0500 10/04/19 1508 10/05/19 0330 10/06/19 0440 10/07/19 0521 10/08/19 0118  WBC 7.5  --   --  13.6* 16.3* 20.3* 18.3*  NEUTROABS 6.5  --   --  11.5* 14.0* 17.9*  --   HGB 17.1*   < > 11.2* 11.5* 12.4 13.8 14.0  HCT 56.1*   < > 33.0* 37.2 41.4 45.0 47.4*  MCV 90.8  --   --  92.3 92.6 93.2 93.5  PLT 233  --   --  385 424* 487* 433*   < > = values in this interval not displayed.   Basic Metabolic Panel: Recent Labs  Lab 10/03/19 1555 10/03/19 1807 10/04/19 0500 10/04/19 1508 10/06/19 0440 10/07/19 0521 10/08/19 0118 10/09/19 0059 10/10/19 0527  NA   < >  --  142   < > 141 142 142 136  130*  K   < >  --  3.9   < > 4.3 4.3 4.0 3.5 3.9  CL  --   --  104   < > 104 100 103 98 95*  CO2  --   --  26   < > 25 26 26 27 24   GLUCOSE  --   --  302*   < > 271* 130* 171* 159* 184*  BUN  --   --  92*   < > 43* 36* 38* 33* 34*  CREATININE  --   --  1.27*   < > 0.68 0.58 0.63 0.51 0.51  CALCIUM  --   --  8.4*   < > 8.6* 9.0 9.0 8.8* 8.9  MG  --  3.0* 3.1*  --   --   --   --   --   --   PHOS  --  4.0 5.0*  --   --   --   --   --   --    < > = values in this interval not displayed.   GFR: Estimated Creatinine Clearance:  63.5 mL/min (by C-G formula based on SCr of 0.51 mg/dL). Liver Function Tests: Recent Labs  Lab 10/05/19 0330 10/06/19 0440 10/07/19 0521 10/09/19 0059 10/10/19 0527  AST 20 41 42* 27 25  ALT 47* 49* 53* 49* 43  ALKPHOS 58 61 61 55 52  BILITOT 0.3 0.6 0.5 0.7 0.7  PROT 6.4* 6.6 6.6 6.1* 6.1*  ALBUMIN 2.6* 2.7* 2.8* 2.8* 2.8*   No results for input(s): LIPASE, AMYLASE in the last 168 hours. No results for input(s): AMMONIA in the last 168 hours. Coagulation Profile: No results for input(s): INR, PROTIME in the last 168 hours. Cardiac Enzymes: No results for input(s): CKTOTAL, CKMB, CKMBINDEX, TROPONINI in the last 168 hours. BNP (last 3 results) No results for input(s): PROBNP in the last 8760 hours. HbA1C: No results for input(s): HGBA1C in the last 72 hours. CBG: Recent Labs  Lab 10/09/19 2138 10/09/19 2338 10/10/19 0328 10/10/19 0726 10/10/19 1219  GLUCAP 153* 153* 202* 148* 148*   Lipid Profile: No results for input(s): CHOL, HDL, LDLCALC, TRIG, CHOLHDL, LDLDIRECT in the last 72 hours. Thyroid Function Tests: No results for input(s): TSH, T4TOTAL, FREET4, T3FREE, THYROIDAB in the last 72 hours. Anemia Panel: Recent Labs    10/09/19 0059 10/10/19 0527  FERRITIN 243 322*      Radiology Studies: I have reviewed all of the imaging during this hospital visit personally     Scheduled Meds: . amLODipine  10 mg Oral Daily  .  baricitinib  4 mg Oral Daily  . carvedilol  3.125 mg Oral BID WC  . Chlorhexidine Gluconate Cloth  6 each Topical Daily  . dexamethasone (DECADRON) injection  6 mg Intravenous Q24H  . enoxaparin (LOVENOX) injection  0.5 mg/kg Subcutaneous Q12H  . feeding supplement (ENSURE ENLIVE)  237 mL Oral TID BM  . influenza vaccine adjuvanted  0.5 mL Intramuscular Tomorrow-1000  . insulin aspart  0-15 Units Subcutaneous Q4H  . insulin detemir  12 Units Subcutaneous Daily  . nystatin  5 mL Oral QID  . pantoprazole  40 mg Oral QHS  . sodium chloride flush  10-40 mL Intracatheter Q12H   Continuous Infusions: . sodium chloride Stopped (10/03/19 1652)     LOS: 8 days        Tacara Hadlock Gerome Apley, MD

## 2019-10-10 NOTE — Plan of Care (Signed)

## 2019-10-10 NOTE — Progress Notes (Signed)
Physical Therapy Treatment Patient Details Name: Kristina [name] MRN: [id number] DOB: 1951/02/11 Today's Date: 10/10/2019    History of Present Illness 69 year old female admitted on January 7 due to ARDS from Covid pneumonia requiring intubation and mechanical ventilation at [facility] for hypoxia and encephalopathy. Extubated 1/11; elevated BNP, troponin with ?CHF vs pulmonary edema vs myocarditis  PMH-HTN    PT Comments    Refer to saturation walk test for specifics. Patient presents with weakness but has caregivers at home. Patient will progress at home. Recommend HHPT, has DME. MD notified. Hopeful for DC today.   Follow Up Recommendations  Home health PT     Equipment Recommendations  None recommended by PT    Recommendations for Other Services OT consult     Precautions / Restrictions Precautions Precaution Comments: monitor sats    Mobility  Bed Mobility   Bed Mobility: Supine to Sit     Supine to sit: HOB elevated;Supervision        Transfers  Stand pivot to [facility] and back to be with min guard.   Equipment used: None Transfers: Sit to/from [employer] to Stand: Therapist, art transfer comment: initially required steady assist, LOB x 1, placed RW in front  Ambulation/Gait Ambulation/Gait assistance: Min assist;Min guard Gait Distance (Feet): 40 Feet Assistive device: Rolling walker (2 wheeled) Gait Pattern/deviations: Step-to pattern;Step-through pattern Gait velocity: decr   General Gait Details: gait is steady with RW, very slow, pt. reports feeling weak   Stairs             Wheelchair Mobility    Modified Rankin (Stroke Patients Only)       Balance Overall balance assessment: Needs assistance   Sitting balance-Leahy Scale: Fair     Standing balance support: Bilateral upper extremity supported Standing balance-Leahy Scale: Fair Standing balance comment: requires UE support                             Cognition Arousal/Alertness: Awake/alert Behavior During Therapy: Flat affect;WFL for tasks assessed/performed                                          Exercises Other Exercises Other Exercises: Pursed lip breathing throughout with mod cues on technique. Other Exercises: educated in use of flutter valve; 10 breaths/hour, 1-2 at a time Other Exercises: HEP provided, only demonstrated the TB n standing hip flex, abd,toe raises, knee bends x5-10 as tolerated    General Comments        Pertinent Vitals/Pain Pain Assessment: No/denies pain    Home Living                      Prior Function            PT Goals (current goals can now be found in the care plan section) Progress towards PT goals: Progressing toward goals    Frequency    Min 3X/week      PT Plan Current plan remains appropriate    Co-evaluation              AM-PAC PT "6 Clicks" Mobility   Outcome Measure  Help needed turning from your back to your side while in a flat bed without using bedrails?: A Little Help needed moving  from lying on your back to sitting on the side of a flat bed without using bedrails?: A Little Help needed moving to and from a bed to a chair (including a wheelchair)?: A Little Help needed standing up from a chair using your arms (e.g., wheelchair or bedside chair)?: A Little Help needed to walk in hospital room?: A Little Help needed climbing 3-5 steps with a railing? : A Lot 6 Click Score: 17    End of Session Equipment Utilized During Treatment: Oxygen Activity Tolerance: Patient tolerated treatment well;Patient limited by fatigue Patient left: in chair;with call bell/phone within reach;with chair alarm set Nurse Communication: Mobility status;Other (comment) PT Visit Diagnosis: Muscle weakness (generalized) (M62.81);Difficulty in walking, not elsewhere classified (R26.2)     Time: [id number] PT Time Calculation (min) (ACUTE ONLY): 38  min  Charges:  $Gait Training: 8-22 mins $Self Care/Home Management: 8-22          Therapeutic exercise 8-22           [place] Pager [phone] Office [phone]    Claretha Cooper 10/10/2019, 1:48 PM

## 2019-10-10 NOTE — TOC Progression Note (Signed)
Transition of Care [facility]) - Progression Note    Patient Details  Name: Kristina [name] MRN: [id number] Date of Birth: 1951/05/16  Transition [facility]) CM/SW Contact  Rae Mar, RN Phone Number: 10/10/2019, 1:23 PM  Clinical Narrative:     Pt was planned for transition home today with [facility] and DME O2 needs.  CM spoke with Magda Paganini with [facility] who will arrange delivery of home O2.  On transition home the [facility] will provide O2 for the duration of the vehicle ride to pt's home.  CM spoke with [place], [facility], [facility], [place], [facility], [place], and [place] agencies who are all unable to accept the pt for services for various reasons.  CM spoke with Malachy Mood with [facility] who is able to accept the pt for services.  Information for both agencies placed on AVS.  CM updated Dr. Cathlean Sauer who advised pt would not be ready for transition home today with need for continued medical care.  CM updated Magda Paganini and Malachy Mood who will follow pt for transition.  TOC will be available for further needs should they arise.  Expected Discharge Plan: [place] Barriers to Discharge: Barriers Resolved  Expected Discharge Plan and Services Expected Discharge Plan: [place]   Discharge Planning Services: CM Consult   Living arrangements for the past 2 months: Single Family Home Expected Discharge Date: 10/10/19               DME Arranged: Oxygen DME Agency: [place] Date DME Agency Contacted: 10/10/19 Time DME Agency Contacted: 1150 Representative spoke with at DME Agency: Magda Paganini HH Arranged: RN, PT, OT [facility] Agency: [place] Date [place]: 10/10/19 Time [place]: 1108 Representative spoke with at [place]: [place] Interventions    Readmission Risk Interventions No flowsheet data found.

## 2019-10-11 LAB — FERRITIN: Ferritin: 295 ng/mL (ref 11–307)

## 2019-10-11 LAB — COMPREHENSIVE METABOLIC PANEL
ALT: 39 U/L (ref 0–44)
AST: 26 U/L (ref 15–41)
Albumin: 2.8 g/dL — ABNORMAL LOW (ref 3.5–5.0)
Alkaline Phosphatase: 48 U/L (ref 38–126)
Anion gap: 10 (ref 5–15)
BUN: 30 mg/dL — ABNORMAL HIGH (ref 8–23)
CO2: 27 mmol/L (ref 22–32)
Calcium: 8.7 mg/dL — ABNORMAL LOW (ref 8.9–10.3)
Chloride: 94 mmol/L — ABNORMAL LOW (ref 98–111)
Creatinine, Ser: 0.5 mg/dL (ref 0.44–1.00)
GFR calc Af Amer: >60 mL/min (ref >60)
GFR calc non Af Amer: >60 mL/min (ref >60)
Glucose, Bld: 168 mg/dL — ABNORMAL HIGH (ref 70–99)
Potassium: 4.2 mmol/L (ref 3.5–5.1)
Sodium: 131 mmol/L — ABNORMAL LOW (ref 135–145)
Total Bilirubin: 0.6 mg/dL (ref 0.3–1.2)
Total Protein: 5.7 g/dL — ABNORMAL LOW (ref 6.5–8.1)

## 2019-10-11 LAB — GLUCOSE, CAPILLARY
Glucose-Capillary: 168 mg/dL — ABNORMAL HIGH (ref 70–99)
Glucose-Capillary: 150 mg/dL — ABNORMAL HIGH (ref 70–99)
Glucose-Capillary: 134 mg/dL — ABNORMAL HIGH (ref 70–99)
Glucose-Capillary: 102 mg/dL — ABNORMAL HIGH (ref 70–99)
Glucose-Capillary: 167 mg/dL — ABNORMAL HIGH (ref 70–99)

## 2019-10-11 LAB — D-DIMER, QUANTITATIVE: D-Dimer, Quant: 7.7 ug/mL-FEU — ABNORMAL HIGH (ref 0.00–0.50)

## 2019-10-11 LAB — C-REACTIVE PROTEIN: CRP: 0.6 mg/dL (ref <1.0)

## 2019-10-11 MED ORDER — ENOXAPARIN SODIUM 40 MG/0.4ML ~~LOC~~ SOLN
40.0000 mg | Freq: Two times a day (BID) | SUBCUTANEOUS | Status: DC
  Administered 2019-10-12 – 2019-10-13 (×4): 40 mg via SUBCUTANEOUS
  Filled 2019-10-11 (×4): qty 0.4

## 2019-10-11 NOTE — Plan of Care (Signed)

## 2019-10-11 NOTE — Progress Notes (Signed)
PROGRESS NOTE    Kristina [name]  [id number] DOB: 11-Oct-1950 DOA: 10/02/2019 PCP: Jani Gravel, MD    Brief Narrative:  69 year old female who presented with dyspnea. Patient was admitted onJanuary 7 due to ARDS SARS COVID-19 viral pneumonia, she was placed on invasive mechanical ventilation at [facility].   She was transferred to same-day to [facility]. Apparently patient was found minimally responsive and profoundly hypoxemic,her oximetry was in the 80%s,and she required intubation. Her lungs were clear to auscultation bilaterally, heart S1-S2 present, rhythmic, no gallops, posterior murmurs, her abdomen was soft, no lower extremity edema.  Sodium 138, potassium 4.6, chloride 97, bicarb 24, glucose 159, BUN 40, creatinine 1.18,white count 23.5, hemoglobin 14.0, hematocrit 46.1, platelets 398.Urinalysis had 30 protein, specific gravity 1.01, 6-10 red cells, more than 50 white cells. Her chest radiograph had bilateral interstitial infiltrates upper lobes.Her EKG had 130 bpm, normal axis, normal intervals, sinus rhythm, no ST segment or T wave changes.  She received mechanical ventilation per ARDS net protocol, including proning. Shereceived medical therapy with remdesivir, baricitiniband dexamethasone. She was liberated from mechanical ventilation January 11.  Transferred to [facility] on 10/08/19  Troponin elevation due to hypoxemic respiratory failure, ruled out acute coronary syndrome.  Further work up with CT chest negative for pulmonary embolism, positive bilateral ground glass opacities. Slowly improving oxygenation. Patient very weak and deconditioned, plan for dc home with home health services on 10/13/19. Will need home 02 and dvt prophylaxis.     Assessment & Plan:   Principal Problem:   Pneumonia due to COVID-19 virus Active Problems:   Acute respiratory distress syndrome (ARDS) due to COVID-19 virus [facility])   Essential hypertension  Uncontrolled diabetes mellitus ([place])   UTI (urinary tract infection)   Thrush    1.Acute hypoxic respiratory failure due to SARS COVID-19 viral pneumonia.   RR:20  Pulse oxymetry: 99%  Fi02: 1 L/ min per [place]  COVID-19 Labs  Recent Labs    10/09/19 0059 10/10/19 0527 10/11/19 0440  DDIMER 7.10* 8.96* 7.70*  FERRITIN 243 322* 295  CRP 1.5* 0.7 0.6    Lab Results  Component Value Date   SARSCOV2NAA POSITIVE (A) 10/02/2019    Patient has completed medical therapy with remdesivir, dexamethasone and baricitinib. Her respiratory failure along with inflammatory markers and symptoms slowly improved.  Continue oxygen monitoring, keep 02 sat more than 88%.   2.Urine tract infection due to Streptococcus, present on admission. Completed cefazolin with no major complications.  3.Hypertension.Continue with amlodipine and carvedilol for further blood pressure control.  4.Type 2 diabetes mellitus (hemoglobin 123456 6.5)complicated by steroid-induced hyperglycemia.Continue insulin sliding scale for glucose cover and monitoring.   5.Oral thrush.Continue with nystatin, to complete course of 10 days.   DVT prophylaxis: enoxaparin high dose  Code Status:  full Family Communication: no family at the bedside Disposition Plan/ discharge barriers: Plan for dc home on Monday.    Subjective: Patient's symptoms continue to improve slowly, dyspnea improving but not yet back to baseline, no nausea or vomiting, no chest pain.   Objective: Vitals:   10/10/19 2010 10/11/19 0356 10/11/19 0400 10/11/19 0717  BP: (!) 153/77 (!) 147/99 (!) 133/96   Pulse: 89 85 86   Resp: 20     Temp: 98.1 F (36.7 C) 98.1 F (36.7 C) 98.3 F (36.8 C) 97.8 F (36.6 C)  TempSrc:  Oral  Oral  SpO2: 97% 99% 100%   Weight:      Height:  Intake/Output Summary (Last 24 hours) at 10/11/2019 0849 Last data filed at 10/11/2019 0300 Gross per 24 hour  Intake 0 ml  Output --  Net 0 ml    Filed Weights   10/08/19 0500 10/09/19 0426 10/10/19 0444  Weight: 81.6 kg 80.9 kg 82.5 kg    Examination:   General: Not in pain or dyspnea, deconditioned  Neurology: Awake and alert, non focal  E ENT: mild pallor, no icterus, oral mucosa moist Cardiovascular: No JVD. S1-S2 present, rhythmic. No lower extremity edema. Pulmonary: vesicular breath sounds bilaterally. Gastrointestinal. Abdomen with no organomegaly, non tender, no rebound or guarding Skin. No rashes Musculoskeletal: no joint deformities     Data Reviewed: I have personally reviewed following labs and imaging studies  CBC: Recent Labs  Lab 10/04/19 1508 10/05/19 0330 10/06/19 0440 10/07/19 0521 10/08/19 0118  WBC  --  13.6* 16.3* 20.3* 18.3*  NEUTROABS  --  11.5* 14.0* 17.9*  --   HGB 11.2* 11.5* 12.4 13.8 14.0  HCT 33.0* 37.2 41.4 45.0 47.4*  MCV  --  92.3 92.6 93.2 93.5  PLT  --  385 424* 487* [id number]*   Basic Metabolic Panel: Recent Labs  Lab 10/07/19 0521 10/08/19 0118 10/09/19 0059 10/10/19 0527 10/11/19 0440  NA 142 142 136 130* 131*  K 4.3 4.0 3.5 3.9 4.2  CL 100 103 98 95* 94*  CO2 26 26 27 24 27   GLUCOSE 130* 171* 159* 184* 168*  BUN 36* 38* 33* 34* 30*  CREATININE 0.58 0.63 0.51 0.51 0.50  CALCIUM 9.0 9.0 8.8* 8.9 8.7*   GFR: Estimated Creatinine Clearance: 63.5 mL/min (by C-G formula based on SCr of 0.5 mg/dL). Liver Function Tests: Recent Labs  Lab 10/06/19 0440 10/07/19 0521 10/09/19 0059 10/10/19 0527 10/11/19 0440  AST 41 42* 27 25 26   ALT 49* 53* 49* 43 39  ALKPHOS 61 61 55 52 48  BILITOT 0.6 0.5 0.7 0.7 0.6  PROT 6.6 6.6 6.1* 6.1* 5.7*  ALBUMIN 2.7* 2.8* 2.8* 2.8* 2.8*   No results for input(s): LIPASE, AMYLASE in the last 168 hours. No results for input(s): AMMONIA in the last 168 hours. Coagulation Profile: No results for input(s): INR, PROTIME in the last 168 hours. Cardiac Enzymes: No results for input(s): CKTOTAL, CKMB, CKMBINDEX, TROPONINI in the last 168  hours. BNP (last 3 results) No results for input(s): PROBNP in the last 8760 hours. HbA1C: No results for input(s): HGBA1C in the last 72 hours. CBG: Recent Labs  Lab 10/10/19 1642 10/10/19 2011 10/10/19 2344 10/11/19 0400 10/11/19 0736  GLUCAP 154* 175* 165* 168* 150*   Lipid Profile: No results for input(s): CHOL, HDL, LDLCALC, TRIG, CHOLHDL, LDLDIRECT in the last 72 hours. Thyroid Function Tests: No results for input(s): TSH, T4TOTAL, FREET4, T3FREE, THYROIDAB in the last 72 hours. Anemia Panel: Recent Labs    10/10/19 0527 10/11/19 0440  FERRITIN 322* 295      Radiology Studies: I have reviewed all of the imaging during this hospital visit personally     Scheduled Meds: . amLODipine  10 mg Oral Daily  . baricitinib  4 mg Oral Daily  . carvedilol  3.125 mg Oral BID WC  . Chlorhexidine Gluconate Cloth  6 each Topical Daily  . dexamethasone (DECADRON) injection  6 mg Intravenous Q24H  . enoxaparin (LOVENOX) injection  0.5 mg/kg Subcutaneous Q12H  . feeding supplement (ENSURE ENLIVE)  237 mL Oral TID BM  . influenza vaccine adjuvanted  0.5 mL Intramuscular Tomorrow-1000  .  insulin aspart  0-15 Units Subcutaneous Q4H  . insulin detemir  12 Units Subcutaneous Daily  . nystatin  5 mL Oral QID  . pantoprazole  40 mg Oral QHS  . sodium chloride flush  10-40 mL Intracatheter Q12H   Continuous Infusions: . sodium chloride Stopped (10/03/19 1652)     LOS: 9 days        [place]ge Somers Gerome Apley, MD

## 2019-10-12 LAB — GLUCOSE, CAPILLARY
Glucose-Capillary: 127 mg/dL — ABNORMAL HIGH (ref 70–99)
Glucose-Capillary: 101 mg/dL — ABNORMAL HIGH (ref 70–99)
Glucose-Capillary: 181 mg/dL — ABNORMAL HIGH (ref 70–99)
Glucose-Capillary: 133 mg/dL — ABNORMAL HIGH (ref 70–99)
Glucose-Capillary: 138 mg/dL — ABNORMAL HIGH (ref 70–99)
Glucose-Capillary: 109 mg/dL — ABNORMAL HIGH (ref 70–99)

## 2019-10-12 NOTE — Plan of Care (Signed)

## 2019-10-12 NOTE — Progress Notes (Signed)
PROGRESS NOTE    KNOWLEDGE LEADERS  [id number] DOB: Jun 22, 1951 DOA: 10/02/2019 PCP: Jani Gravel, MD    Brief Narrative:  69 year old female who presented with dyspnea. Patient was admitted onJanuary 7 due to ARDS SARS COVID-19 viral pneumonia, she was placed on invasive mechanical ventilation at [facility].   She was transferred to same-day to [facility]. Apparently patient was found minimally responsive and profoundly hypoxemic,her oximetry was in the 80%s,and she required intubation. Her lungs were clear to auscultation bilaterally, heart S1-S2 present, rhythmic, no gallops, posterior murmurs, her abdomen was soft, no lower extremity edema.  Sodium 138, potassium 4.6, chloride 97, bicarb 24, glucose 159, BUN 40, creatinine 1.18,white count 23.5, hemoglobin 14.0, hematocrit 46.1, platelets 398.Urinalysis had 30 protein, specific gravity 1.01, 6-10 red cells, more than 50 white cells. Her chest radiograph had bilateral interstitial infiltrates upper lobes.Her EKG had 130 bpm, normal axis, normal intervals, sinus rhythm, no ST segment or T wave changes.  She received mechanical ventilation per ARDS net protocol, including proning. Shereceived medical therapy with remdesivir, baricitiniband dexamethasone. She was liberated from mechanical ventilation January 11.  Transferred to [facility] on 10/08/19  Troponin elevation due to hypoxemic respiratory failure, ruled out acute coronary syndrome.  Further work up with CT chest negative for pulmonary embolism, positive bilateral ground glass opacities. Slowly improving oxygenation. Patient very weak and deconditioned, plan for dc home with home health services on 10/13/19. Will need home 02 and dvt prophylaxis.    Assessment & Plan:   Principal Problem:   Pneumonia due to COVID-19 virus Active Problems:   Acute respiratory distress syndrome (ARDS) due to COVID-19 virus [facility])   Essential hypertension  Uncontrolled diabetes mellitus ([place])   UTI (urinary tract infection)   Thrush   1.Acute hypoxic respiratory failure due to SARS COVID-19 viral pneumonia.   RR: 20  Pulse oxymetry: 95 to 97  Fi02: 1 L/ min per [place].   Patient has completed medical therapy with remdesivir and dexamethasone Will continue baricitinib while hospitalized. Her symptoms are slowly improved. Patient will need home health.   Out of bed to chair.   2.Urine tract infection due to Streptococcus, present on admission. sp cefazolin.  3.Hypertension.On amlodipine and carvedilol for blood pressure control.  4.Type 2 diabetes mellitus (hemoglobin 123456 6.5)complicated by steroid-induced hyperglycemia.On insulin sliding scale for glucose cover and monitoring. Patient is tolerating po well.   5.Oral thrush.On nystatin, to complete course of 10 days.   DVT prophylaxis:enoxaparinhigh dose Code Status:full Family Communication:no family at the bedside Disposition Plan/ discharge barriers:Plan for dc home on Monday.   Subjective: Patient continue to improve dyspnea and level of energy, tolerating po well, no nausea or vomiting. No chest pain, out of bed to chair. Not yet back to baseline.   Objective: Vitals:   10/11/19 1505 10/11/19 1947 10/12/19 0345 10/12/19 0733  BP: (!) 150/92 (!) 152/78 138/79 (!) 141/77  Pulse: 89 97 81 92  Resp:  20    Temp: 98.9 F (37.2 C) 98.7 F (37.1 C) 98.3 F (36.8 C) 98.8 F (37.1 C)  TempSrc: Oral Oral  Oral  SpO2: 97% 94% 97% 95%  Weight:      Height:       No intake or output data in the 24 hours ending 10/12/19 0803 Filed Weights   10/08/19 0500 10/09/19 0426 10/10/19 0444  Weight: 81.6 kg 80.9 kg 82.5 kg    Examination:   General: deconditioned  Neurology: Awake and alert, non focal  E ENT: mild pallor, no icterus, oral mucosa moist Cardiovascular: No JVD. S1-S2 present, rhythmic. No lower extremity edema. Pulmonary: positive  breath sounds bilaterally. Gastrointestinal. Abdomen with no organomegaly, non tender, no rebound or guarding Skin. No rashes Musculoskeletal: no joint deformities     Data Reviewed: I have personally reviewed following labs and imaging studies  CBC: Recent Labs  Lab 10/06/19 0440 10/07/19 0521 10/08/19 0118  WBC 16.3* 20.3* 18.3*  NEUTROABS 14.0* 17.9*  --   HGB 12.4 13.8 14.0  HCT 41.4 45.0 47.4*  MCV 92.6 93.2 93.5  PLT 424* 487* [id number]*   Basic Metabolic Panel: Recent Labs  Lab 10/07/19 0521 10/08/19 0118 10/09/19 0059 10/10/19 0527 10/11/19 0440  NA 142 142 136 130* 131*  K 4.3 4.0 3.5 3.9 4.2  CL 100 103 98 95* 94*  CO2 26 26 27 24 27   GLUCOSE 130* 171* 159* 184* 168*  BUN 36* 38* 33* 34* 30*  CREATININE 0.58 0.63 0.51 0.51 0.50  CALCIUM 9.0 9.0 8.8* 8.9 8.7*   GFR: Estimated Creatinine Clearance: 63.5 mL/min (by C-G formula based on SCr of 0.5 mg/dL). Liver Function Tests: Recent Labs  Lab 10/06/19 0440 10/07/19 0521 10/09/19 0059 10/10/19 0527 10/11/19 0440  AST 41 42* 27 25 26   ALT 49* 53* 49* 43 39  ALKPHOS 61 61 55 52 48  BILITOT 0.6 0.5 0.7 0.7 0.6  PROT 6.6 6.6 6.1* 6.1* 5.7*  ALBUMIN 2.7* 2.8* 2.8* 2.8* 2.8*   No results for input(s): LIPASE, AMYLASE in the last 168 hours. No results for input(s): AMMONIA in the last 168 hours. Coagulation Profile: No results for input(s): INR, PROTIME in the last 168 hours. Cardiac Enzymes: No results for input(s): CKTOTAL, CKMB, CKMBINDEX, TROPONINI in the last 168 hours. BNP (last 3 results) No results for input(s): PROBNP in the last 8760 hours. HbA1C: No results for input(s): HGBA1C in the last 72 hours. CBG: Recent Labs  Lab 10/11/19 0736 10/11/19 1206 10/11/19 1522 10/11/19 1946 10/12/19 0004  GLUCAP 150* 134* 102* 167* 127*   Lipid Profile: No results for input(s): CHOL, HDL, LDLCALC, TRIG, CHOLHDL, LDLDIRECT in the last 72 hours. Thyroid Function Tests: No results for input(s): TSH,  T4TOTAL, FREET4, T3FREE, THYROIDAB in the last 72 hours. Anemia Panel: Recent Labs    10/10/19 0527 10/11/19 0440  FERRITIN 322* 295      Radiology Studies: I have reviewed all of the imaging during this hospital visit personally     Scheduled Meds: . amLODipine  10 mg Oral Daily  . baricitinib  4 mg Oral Daily  . carvedilol  3.125 mg Oral BID WC  . Chlorhexidine Gluconate Cloth  6 each Topical Daily  . dexamethasone (DECADRON) injection  6 mg Intravenous Q24H  . enoxaparin (LOVENOX) injection  40 mg Subcutaneous Q12H  . feeding supplement (ENSURE ENLIVE)  237 mL Oral TID BM  . influenza vaccine adjuvanted  0.5 mL Intramuscular Tomorrow-1000  . insulin aspart  0-15 Units Subcutaneous Q4H  . insulin detemir  12 Units Subcutaneous Daily  . nystatin  5 mL Oral QID  . pantoprazole  40 mg Oral QHS  . sodium chloride flush  10-40 mL Intracatheter Q12H   Continuous Infusions: . sodium chloride Stopped (10/03/19 1652)     LOS: 10 days        Jamarques Pinedo Gerome Apley, MD

## 2019-10-12 NOTE — Plan of Care (Signed)

## 2019-10-13 LAB — GLUCOSE, CAPILLARY
Glucose-Capillary: 145 mg/dL — ABNORMAL HIGH (ref 70–99)
Glucose-Capillary: 95 mg/dL (ref 70–99)
Glucose-Capillary: 77 mg/dL (ref 70–99)
Glucose-Capillary: 174 mg/dL — ABNORMAL HIGH (ref 70–99)

## 2019-10-13 NOTE — TOC Transition Note (Signed)
Transition of Care [facility]) - CM/SW Discharge Note   Patient Details  Name: ELETHA FROH MRN: [id number] Date of Birth: [birth year]  Transition of Care [facility]) CM/SW Contact:  Geralynn Ochs, LCSW Phone Number: 10/13/2019, 12:32 PM   Clinical Narrative:   CSW notified RN to pull portable oxygen tank from [employer], and notified Malachy Mood with [facility] that patient discharging home today for them to reach out to start home health services. No other needs at this time.     Final next level of care: [place] Barriers to Discharge: Barriers Resolved   Patient Goals and CMS Choice     Choice offered to / list presented to : NA  Discharge Placement                Patient to be transferred to facility by: Family car Name of family member notified: Self Patient and family notified of of transfer: 10/13/19  Discharge Plan and Services   Discharge Planning Services: CM Consult            DME Arranged: Oxygen DME Agency: [place] Date DME Agency Contacted: 10/10/19 Time DME Agency Contacted: 1150 Representative spoke with at DME Agency: Magda Paganini HH Arranged: RN, PT, OT [facility] Agency: [place] Date [place]: 10/13/19 Time [place]: [phone] Representative spoke with at [place]: [place] Determinants of Health ([place]) Interventions     Readmission Risk Interventions No flowsheet data found.

## 2019-10-13 NOTE — Progress Notes (Signed)
PICC removed, RA @ 95%, no signs of resp distress, AVS explained, no further questions. Answered all questions for daughter. Transport via wheelchair  to daughter in car.

## 2019-10-13 NOTE — Care Management Important Message (Signed)
Important Message  Patient Details  Name: Kristina [name] MRN: [id number] Date of Birth: 1951-09-04   Medicare Important Message Given:  Yes - Important Message mailed due to current National Emergency  Verbal consent obtained due to current National Emergency  Relationship to patient: Child Contact Name: Redge Gainer Call Date: 10/13/19  Time: 1153 Phone: [phone] Outcome: Spoke with contact Important Message mailed to: Patient address on file    Delorse Lek 10/13/2019, 11:53 AM

## 2019-10-13 NOTE — Plan of Care (Signed)
  Problem: Education: Goal: Knowledge of risk factors and measures for prevention of condition will improve 10/13/2019 1446 by Levie Heritage, RN Outcome: Adequate for Discharge 10/13/2019 1445 by Levie Heritage, RN Outcome: Progressing

## 2019-10-13 NOTE — Discharge Summary (Signed)
Physician Discharge Summary  Kristina [name] [id number] DOB: 04-05-1951 DOA: 10/02/2019  PCP: Jani Gravel, MD  Admit date: 10/02/2019 Discharge date: 10/13/2019  Admitted From: Home  Disposition:  Home   Recommendations for Outpatient Follow-up and new medication changes:  1. Follow up with Dr. Maudie Mercury in 2 [place].  2. Continue quarantine for 2 [place], use a mask in public and maintain physical distancing. 3.   Patient placed on enoxaparin for DVT prophylaxis  Home Health: Yes   Equipment/Devices: Home 02   Discharge Condition: Stable  CODE STATUS: full Diet recommendation: heart healthy   Brief/Interim Summary: 69 year old female who presented with dyspnea. Patient was admitted onJanuary 7 due to ARDS SARS COVID-19 viral pneumonia, she was placed on invasive mechanical ventilation at [facility].   She was transferred to same-day to [facility]. Apparently patient was found minimally responsive and profoundly hypoxemic,her oximetry was in the 80%s,and she required intubation. Her lungs were clear to auscultation bilaterally, heart S1-S2 present, rhythmic, no gallops, posterior murmurs, her abdomen was soft, no lower extremity edema.  Sodium 138, potassium 4.6, chloride 97, bicarb 24, glucose 159, BUN 40, creatinine 1.18,white count 23.5, hemoglobin 14.0, hematocrit 46.1, platelets 398.Urinalysis had 30 protein, specific gravity 1.01, 6-10 red cells, more than 50 white cells. Her chest radiograph had bilateral interstitial infiltrates upper lobes.Her EKG had 130 bpm, normal axis, normal intervals, sinus rhythm, no ST segment or T wave changes.  She received mechanical ventilation per ARDS net protocol, including proning. Shereceived medical therapy with remdesivir, baricitiniband dexamethasone. She was liberated from mechanical ventilation January 11.  Transferred to [facility] on 10/08/19  Troponin elevation due to hypoxemic respiratory failure, ruled out  acute coronary syndrome.  Patient responded well to medical therapy. Will be discharge home on enoxaparin for DVT prophylaxis and supplemental 02 for resolving hypoxic respiratory failure.   1.Acute hypoxic respiratory failure due to SARS COVID-19 viral pneumonia. Patient was admitted to the intensive care unit, she received invasive mechanical ventilation per ARDS net protocol including prone position. Medical therapy with remdesivir, dexamethasone and baricitinib.She was successfully liberated from mechanical ventilation January 11. Her respiratory failure along with inflammatory markers and symptoms slowly improved.  COVID-19 Labs  Recent Labs    10/11/19 0440  DDIMER 7.70*  FERRITIN 295  CRP 0.6    Lab Results  Component Value Date   SARSCOV2NAA POSITIVE (A) 10/02/2019    Patient had decreased mobility, physical therapy recommended home health services. Her oximetry at discharge is 97% on 2 L per nasal cannula and 93% on room air at rest. She had persistent elevated D-dimer, further work-up with CT chest PE protocol was performed.  Patient was seen by physical therapy, recommendations for home health services. Patient will have to have negative Covid testing in order to be considered for inpatient rehab.  Home O2 screen will be performed before discharge.  2.Urine tract infection due to Streptococcus, present on admission. Received antibiotic therapy with cefazolin with no major complications.  3.Hypertension.Initially antihypertensive agents were held as patient improved she required amlodipine and carvedilol for further blood pressure control.  4.Type 2 diabetes mellitus (hemoglobin 123456 6.5)complicated by steroid-induced hyperglycemia.Patient received insulin therapy per sliding scale for glucose coverage and monitoring.  5.Oral thrush.Patient was placed on nystatin, to complete course of 10 days.  Discharge Diagnoses:  Principal Problem:    Pneumonia due to COVID-19 virus Active Problems:   Acute respiratory distress syndrome (ARDS) due to COVID-19 virus [facility])   Essential hypertension  Uncontrolled diabetes mellitus ([place])   UTI (urinary tract infection)   Thrush    Discharge Instructions  Discharge Instructions    MyChart COVID-19 home monitoring program   Complete by: Oct 10, 2019    Is the patient willing to use the [place] for home monitoring?: Yes   Temperature monitoring   Complete by: Oct 10, 2019    After how many days would you like to receive a notification of this patient's flowsheet entries?: 1   Diet - low sodium heart healthy   Complete by: As directed    Discharge instructions   Complete by: As directed    Continue quarantine for 2 [place], use a mask in public and maintain physical distancing.   Increase activity slowly   Complete by: As directed      Allergies as of 10/13/2019      Reactions   Penicillins    Not sure of allergy -information give by Spouse.      Medication List    STOP taking these medications   azithromycin 250 MG tablet Commonly known as: ZITHROMAX   predniSONE 10 MG tablet Commonly known as: DELTASONE     TAKE these medications   albuterol (2.5 MG/3ML) 0.083% nebulizer solution Commonly known as: PROVENTIL Take 2.5 mg by nebulization every 6 (six) hours.   amLODipine 10 MG tablet Commonly known as: NORVASC Take 10 mg by mouth daily.   aspirin EC 81 MG tablet Take 1 tablet (81 mg total) by mouth daily.   carvedilol 3.125 MG tablet Commonly known as: COREG Take 3.125 mg by mouth 2 (two) times daily with a meal.   enoxaparin 40 MG/0.4ML injection Commonly known as: LOVENOX Inject 0.4 mLs (40 mg total) into the skin daily.   hydrochlorothiazide 25 MG tablet Commonly known as: HYDRODIURIL Take 25 mg by mouth daily.   losartan-hydrochlorothiazide 100-25 MG tablet Commonly known as: HYZAAR Take 1 tablet by mouth daily.   nystatin 100000 UNIT/ML  suspension Commonly known as: MYCOSTATIN Take 5 mLs (500,000 Units total) by mouth 4 (four) times daily for 7 days.   omeprazole 40 MG capsule Commonly known as: PRILOSEC Take 1 capsule by mouth daily.   ondansetron 4 MG tablet Commonly known as: ZOFRAN Take 4 mg by mouth every 6 (six) hours as needed for nausea or vomiting.   rosuvastatin 10 MG tablet Commonly known as: CRESTOR Take 10 mg by mouth daily.            Durable Medical Equipment  (From admission, onward)         Start     Ordered   10/10/19 1140  For home use only DME oxygen  Once    Question Answer Comment  Length of Need 6 Months   Mode or (Route) Nasal cannula   Liters per Minute 2   Frequency Continuous (stationary and portable oxygen unit needed)   Oxygen conserving device Yes   Oxygen delivery system Gas      10/10/19 1140         Follow-up Information    [facility] [place] Follow up.   Why: Nursing, physical therapy, occupational therapy Contact information: [place] [address] [phone]          Allergies  Allergen Reactions  . Penicillins     Not sure of allergy -information give by Spouse.    Consultations:     Procedures/Studies: DG Abd 1 View  Result Date: 10/03/2019 CLINICAL DATA:  Orogastric tube placement EXAM: ABDOMEN - 1 VIEW COMPARISON:  None. FINDINGS: Orogastric tube tip and side port project over the stomach. The bowel gas pattern is normal. No radio-opaque calculi or other significant radiographic abnormality are seen. IMPRESSION: Orogastric tube tip and side port in the stomach. Electronically Signed   By: Ulyses Jarred M.D.   On: 10/03/2019 00:27   CT ANGIO CHEST PE W OR WO CONTRAST  Result Date: 10/10/2019 CLINICAL DATA:  Shortness of breath EXAM: CT ANGIOGRAPHY CHEST WITH CONTRAST TECHNIQUE: Multidetector CT imaging of the chest was performed using the standard protocol during bolus administration of intravenous contrast.  Multiplanar CT image reconstructions and MIPs were obtained to evaluate the vascular anatomy. CONTRAST:  75 mL Omnipaque 350 nonionic COMPARISON:  Chest radiograph October 06, 2019 FINDINGS: Cardiovascular: There is no demonstrable pulmonary embolus. There is no thoracic aortic aneurysm or dissection. Visualized great vessels appear unremarkable. Note that the right innominate and left common carotid arteries arise as a common trunk, an anatomic variant. There are foci of aortic atherosclerosis. There are foci of coronary artery calcification. Central catheter tip is in the superior vena cava. There is no pericardial effusion or pericardial thickening. Mediastinum/Nodes: There are subcentimeter nodular opacities in the thyroid without dominant thyroid mass. There is no appreciable thoracic adenopathy. No esophageal lesions are evident. Lungs/Pleura: There is multifocal airspace opacity throughout the lungs bilaterally with mild consolidation in the lung bases, somewhat more prominent on the left than on the right. No pleural effusions are evident. Upper Abdomen: There is mild ascites adjacent to the liver. There is hepatic steatosis. Visualized upper abdominal structures otherwise appear unremarkable. Musculoskeletal: There are no blastic or lytic bone lesions. No chest wall lesions are evident. Review of the MIP images confirms the above findings. IMPRESSION: 1. No demonstrable pulmonary embolus. No thoracic aortic aneurysm or dissection. There is aortic atherosclerosis as well as foci of coronary artery calcification. 2. Multifocal airspace opacity with consolidation in portions of each lower lobe, more notable on the left than on the right. No pleural effusions. 3.  Ascites noted along the lateral aspect of the liver. 4. Subcentimeter thyroid nodular opacities consistent with multinodular goiter. No dominant mass in the thyroid to warrant additional imaging surveillance. 5.  Hepatic steatosis. 6.  No demonstrable  adenopathy. Aortic Atherosclerosis (ICD10-I70.0). Electronically Signed   By: Lowella Grip III M.D.   On: 10/10/2019 10:23   DG Chest Port 1 View  Result Date: 10/06/2019 CLINICAL DATA:  Respiratory distress.  COVID positive. EXAM: PORTABLE CHEST 1 VIEW COMPARISON:  Chest x-ray from yesterday. FINDINGS: Unchanged endotracheal and enteric tubes. Unchanged right upper extremity PICC line. Slightly increased bilateral perihilar and peripheral nodular interstitial and airspace opacities superimposed on chronic interstitial changes at the lung bases. Left greater than right basilar atelectasis, unchanged. Suspected small left pleural effusion, similar to prior study. No pneumothorax. No acute osseous abnormality. IMPRESSION: Mildly worsened bilateral perihilar and peripheral nodular interstitial and airspace disease. Electronically Signed   By: Titus Dubin M.D.   On: 10/06/2019 14:26   DG CHEST PORT 1 VIEW  Result Date: 10/05/2019 CLINICAL DATA:  COVID positive. EXAM: PORTABLE CHEST 1 VIEW COMPARISON:  10/02/2019 FINDINGS: 0423 hours. Endotracheal tube tip is 2.3 cm above the base of the carina. The NG tube passes into the stomach although the distal tip position is not included on the film. Right PICC line tip overlies the proximal to mid SVC level. The patchy and nodular airspace disease seen bilaterally on the  prior study has clearly improved in the interval and nearly resolved. There is underlying persistent chronic interstitial changes. Basilar atelectasis with suspected layering tiny bilateral pleural effusions. The visualized bony structures of the thorax are intact. Telemetry leads overlie the chest. IMPRESSION: 1. Near complete interval resolution of the patchy/nodular bilateral airspace disease seen on the previous study. 2. Suspect tiny layering bilateral pleural effusions. 3. New right PICC line tip overlies the proximal to mid SVC level. Electronically Signed   By: Misty Stanley M.D.   On:  10/05/2019 05:26   DG Chest Port 1 View  Result Date: 10/02/2019 CLINICAL DATA:  69 year old female status post intubation. Generalized malaise. COVID-19 infection. EXAM: PORTABLE CHEST 1 VIEW COMPARISON:  Chest x-ray 10/02/2019. FINDINGS: An endotracheal tube is in place with tip 3.1 cm above the carina. A nasogastric tube is seen extending into the stomach, however, the tip of the nasogastric tube extends below the lower margin of the image. Lung volumes are low. Patchy multifocal interstitial and airspace opacities throughout the lungs bilaterally, asymmetrically distributed, with relative sparing in the apex of the left upper lobe and in the lateral aspect of the right lung base. Possible small left pleural effusion. No evidence of pulmonary edema. Heart size is borderline enlarged. Upper mediastinal contours are within normal limits allowing for patient positioning. Aortic atherosclerosis. IMPRESSION: 1. Support apparatus, as above. 2. Severe multilobar bilateral pneumonia with small left pleural effusion, as above. 3. Aortic atherosclerosis. Electronically Signed   By: Vinnie Langton M.D.   On: 10/02/2019 15:40   DG Chest Port 1 View  Result Date: 10/02/2019 CLINICAL DATA:  Shortness of breath EXAM: PORTABLE CHEST 1 VIEW COMPARISON:  None FINDINGS: Low lung volumes reflecting shallow inspiration. There are bilateral pulmonary opacities. Probable small left pleural effusion. No pneumothorax. Cardiomediastinal contours are likely within normal limits for portable technique. IMPRESSION: Bilateral pulmonary opacities suspicious for pneumonia. Probable small left pleural effusion. Electronically Signed   By: Macy Mis M.D.   On: 10/02/2019 10:33   ECHOCARDIOGRAM COMPLETE  Result Date: 10/03/2019   ECHOCARDIOGRAM REPORT   Patient Name:   LIZ BAHAR Date of Exam: 10/03/2019 Medical Rec #:  [id number]       Height:       64.0 in Accession #:    [id number]      Weight:       171.5 lb Date of Birth:   01-14-1951       BSA:          1.83 m Patient Age:    80 years        BP:           92/63 mmHg Patient Gender: F               HR:           67 bpm. Exam Location:  Inpatient Procedure: 2D Echo Indications:    dyspnea 786.09  History:        Patient has no prior history of Echocardiogram examinations.                 Covid; Signs/Symptoms:Dyspnea.  Sonographer:    Johny Chess Referring Phys: [place]  1. Left ventricular ejection fraction, by visual estimation, is 45 to 50%. The left ventricle has mildly decreased function. There is mildly increased left ventricular hypertrophy.  2. The left ventricle demonstrates global hypokinesis.  3. Left ventricular diastolic parameters are indeterminate.  4. Global right ventricle  has mildly reduced systolic function.The right ventricular size is normal.  5. Left atrial size was normal.  6. Right atrial size was normal.  7. The mitral valve is normal in structure. Mild mitral valve regurgitation.  8. The tricuspid valve is grossly normal.  9. The aortic valve is tricuspid. Aortic valve regurgitation is not visualized. No evidence of aortic valve stenosis. 10. The pulmonic valve was not well visualized. Pulmonic valve regurgitation is trivial. 11. TR signal is inadequate for assessing pulmonary artery systolic pressure. FINDINGS  Left Ventricle: Left ventricular ejection fraction, by visual estimation, is 45 to 50%. The left ventricle has mildly decreased function. The left ventricle demonstrates global hypokinesis. There is mildly increased left ventricular hypertrophy. Left ventricular diastolic parameters are indeterminate. Right Ventricle: The right ventricular size is normal. No increase in right ventricular wall thickness. Global RV systolic function is has mildly reduced systolic function. Left Atrium: Left atrial size was normal in size. Right Atrium: Right atrial size was normal in size Pericardium: Trivial pericardial effusion is  present. Mitral Valve: The mitral valve is normal in structure. Mild mitral valve regurgitation. Tricuspid Valve: The tricuspid valve is grossly normal. Tricuspid valve regurgitation is not demonstrated. Aortic Valve: The aortic valve is tricuspid. Aortic valve regurgitation is not visualized. The aortic valve is structurally normal, with no evidence of sclerosis or stenosis. Pulmonic Valve: The pulmonic valve was not well visualized. Pulmonic valve regurgitation is trivial. Pulmonic regurgitation is trivial. Aorta: The aortic root is normal in size and structure. Venous: IVC assessment for right atrial pressure unable to be performed due to mechanical ventilation. IAS/Shunts: The interatrial septum was not well visualized.  LEFT VENTRICLE PLAX 2D LVIDd:         4.50 cm       Diastology LVIDs:         3.00 cm       LV e' lateral:   5.55 cm/s LV PW:         1.26 cm       LV E/e' lateral: 9.8 LV IVS:        1.18 cm       LV e' medial:    5.44 cm/s LVOT diam:     1.80 cm       LV E/e' medial:  10.0 LV SV:         57 ml LV SV Index:   30.26 LVOT Area:     2.54 cm  LV Volumes (MOD) LV area d, A2C:    23.30 cm LV area d, A4C:    22.50 cm LV area s, A2C:    15.40 cm LV area s, A4C:    15.60 cm LV major d, A2C:   7.77 cm LV major d, A4C:   7.69 cm LV major s, A2C:   6.93 cm LV major s, A4C:   6.94 cm LV vol d, MOD A2C: 57.9 ml LV vol d, MOD A4C: 53.5 ml LV vol s, MOD A2C: 29.6 ml LV vol s, MOD A4C: 30.0 ml LV SV MOD A2C:     28.3 ml LV SV MOD A4C:     53.5 ml LV SV MOD BP:      26.3 ml RIGHT VENTRICLE RV S prime:     8.38 cm/s TAPSE (M-mode): 1.2 cm LEFT ATRIUM             Index       RIGHT ATRIUM  Index LA diam:        3.10 cm 1.69 cm/m  RA Area:     13.30 cm LA Vol (A2C):   46.8 ml 25.54 ml/m RA Volume:   32.80 ml  17.90 ml/m LA Vol (A4C):   37.6 ml 20.52 ml/m LA Biplane Vol: 42.6 ml 23.24 ml/m  AORTIC VALVE LVOT Vmax:   86.50 cm/s LVOT Vmean:  53.100 cm/s LVOT VTI:    0.138 m  AORTA Ao Root diam: 3.00  cm MITRAL VALVE MV Area (PHT): 2.91 cm             SHUNTS MV PHT:        75.69 msec           Systemic VTI:  0.14 m MV Decel Time: 261 msec             Systemic Diam: 1.80 cm MV E velocity: 54.30 cm/s 103 cm/s MV A velocity: 63.50 cm/s 70.3 cm/s MV E/A ratio:  0.86       1.5  Oswaldo Milian MD Electronically signed by Oswaldo Milian MD Signature Date/Time: 10/03/2019/8:33:11 PM    Final    [place] EKG SITE RITE  Result Date: 10/03/2019 If Site Rite image not attached, placement could not be confirmed due to current cardiac rhythm.     Procedures:   Subjective: Patient is feeling better, dyspnea has been improving, she is out of bed to chair, no nausea or vomiting.   Discharge Exam: Vitals:   10/13/19 0515 10/13/19 0757  BP: 134/80 (!) 156/82  Pulse: 83 85  Resp: 20 18  Temp: 98.6 F (37 C)   SpO2: 94% 93%   Vitals:   10/12/19 1543 10/12/19 2025 10/13/19 0515 10/13/19 0757  BP: 132/79 (!) 154/82 134/80 (!) 156/82  Pulse: 97 93 83 85  Resp:  20 20 18   Temp: 98.2 F (36.8 C) 98.5 F (36.9 C) 98.6 F (37 C)   TempSrc: Oral Oral Oral Oral  SpO2: 93% 93% 94% 93%  Weight:      Height:        General: Not in pain or dyspnea. Neurology: Awake and alert, non focal  E ENT: mild pallor, no icterus, oral mucosa moist Cardiovascular: No JVD. S1-S2 present, rhythmic. No lower extremity edema. Pulmonary: positive breath sounds bilaterally,. Gastrointestinal. Abdomen with no organomegaly, non tender, no rebound or guarding Skin. No rashes Musculoskeletal: no joint deformities   The results of significant diagnostics from this hospitalization (including imaging, microbiology, ancillary and laboratory) are listed below for reference.     Microbiology: No results found for this or any previous visit (from the past 240 hour(s)).   Labs: BNP (last 3 results) Recent Labs    10/02/19 1009  BNP [id number]*   Basic Metabolic Panel: Recent Labs  Lab 10/07/19 0521 10/08/19 0118  10/09/19 0059 10/10/19 0527 10/11/19 0440  NA 142 142 136 130* 131*  K 4.3 4.0 3.5 3.9 4.2  CL 100 103 98 95* 94*  CO2 26 26 27 24 27   GLUCOSE 130* 171* 159* 184* 168*  BUN 36* 38* 33* 34* 30*  CREATININE 0.58 0.63 0.51 0.51 0.50  CALCIUM 9.0 9.0 8.8* 8.9 8.7*   Liver Function Tests: Recent Labs  Lab 10/07/19 0521 10/09/19 0059 10/10/19 0527 10/11/19 0440  AST 42* 27 25 26   ALT 53* 49* 43 39  ALKPHOS 61 55 52 48  BILITOT 0.5 0.7 0.7 0.6  PROT 6.6 6.1* 6.1* 5.7*  ALBUMIN 2.8* 2.8*  2.8* 2.8*   No results for input(s): LIPASE, AMYLASE in the last 168 hours. No results for input(s): AMMONIA in the last 168 hours. CBC: Recent Labs  Lab 10/07/19 0521 10/08/19 0118  WBC 20.3* 18.3*  NEUTROABS 17.9*  --   HGB 13.8 14.0  HCT 45.0 47.4*  MCV 93.2 93.5  PLT 487* 433*   Cardiac Enzymes: No results for input(s): CKTOTAL, CKMB, CKMBINDEX, TROPONINI in the last 168 hours. BNP: Invalid input(s): POCBNP CBG: Recent Labs  Lab 10/12/19 1542 10/12/19 2018 10/12/19 2307 10/13/19 0514 10/13/19 0756  GLUCAP 133* 138* 109* 95 77   D-Dimer Recent Labs    10/11/19 0440  DDIMER 7.70*   Hgb A1c No results for input(s): HGBA1C in the last 72 hours. Lipid Profile No results for input(s): CHOL, HDL, LDLCALC, TRIG, CHOLHDL, LDLDIRECT in the last 72 hours. Thyroid function studies No results for input(s): TSH, T4TOTAL, T3FREE, THYROIDAB in the last 72 hours.  Invalid input(s): FREET3 Anemia work up Recent Labs    10/11/19 0440  FERRITIN 295   Urinalysis    Component Value Date/Time   COLORURINE YELLOW 10/02/2019 0954   APPEARANCEUR CLOUDY (A) 10/02/2019 0954   LABSPEC 1.018 10/02/2019 0954   PHURINE 5.0 10/02/2019 [place] 10/02/2019 0954   HGBUR NEGATIVE 10/02/2019 [place] NEGATIVE 10/02/2019 0954   KETONESUR NEGATIVE 10/02/2019 0954   PROTEINUR 30 (A) 10/02/2019 0954   NITRITE NEGATIVE 10/02/2019 0954   LEUKOCYTESUR LARGE (A)  10/02/2019 0954   Sepsis Labs Invalid input(s): PROCALCITONIN,  WBC,  LACTICIDVEN Microbiology No results found for this or any previous visit (from the past 240 hour(s)).   Time coordinating discharge: 45 minutes  SIGNED:   Tawni Millers, MD  [facility] 10/13/2019, 9:21 AM

## 2019-10-13 NOTE — Plan of Care (Signed)
  Problem: Education: Goal: Knowledge of risk factors and measures for prevention of condition will improve Outcome: Progressing   

## 2019-10-28 ENCOUNTER — Other Ambulatory Visit: Payer: Self-pay

## 2019-10-28 DIAGNOSIS — U071 COVID-19: Secondary | ICD-10-CM | POA: Diagnosis not present

## 2019-11-24 DIAGNOSIS — Z0000 Encounter for general adult medical examination without abnormal findings: Secondary | ICD-10-CM | POA: Diagnosis not present

## 2019-11-24 DIAGNOSIS — E782 Mixed hyperlipidemia: Secondary | ICD-10-CM | POA: Diagnosis not present

## 2019-11-24 DIAGNOSIS — R7301 Impaired fasting glucose: Secondary | ICD-10-CM | POA: Diagnosis not present

## 2019-11-24 DIAGNOSIS — Z6836 Body mass index (BMI) 36.0-36.9, adult: Secondary | ICD-10-CM | POA: Diagnosis not present

## 2019-11-24 DIAGNOSIS — Z124 Encounter for screening for malignant neoplasm of cervix: Secondary | ICD-10-CM | POA: Diagnosis not present

## 2019-11-24 DIAGNOSIS — R0602 Shortness of breath: Secondary | ICD-10-CM | POA: Diagnosis not present

## 2019-11-24 DIAGNOSIS — R7303 Prediabetes: Secondary | ICD-10-CM | POA: Diagnosis not present

## 2019-11-24 DIAGNOSIS — I10 Essential (primary) hypertension: Secondary | ICD-10-CM | POA: Diagnosis not present

## 2019-11-24 DIAGNOSIS — Z6835 Body mass index (BMI) 35.0-35.9, adult: Secondary | ICD-10-CM | POA: Diagnosis not present

## 2019-12-03 ENCOUNTER — Other Ambulatory Visit (HOSPITAL_COMMUNITY): Payer: Self-pay | Admitting: Internal Medicine

## 2019-12-03 DIAGNOSIS — Z1231 Encounter for screening mammogram for malignant neoplasm of breast: Secondary | ICD-10-CM

## 2019-12-08 ENCOUNTER — Ambulatory Visit (HOSPITAL_COMMUNITY): Payer: Medicare HMO

## 2019-12-10 ENCOUNTER — Ambulatory Visit (HOSPITAL_COMMUNITY)
Admission: RE | Admit: 2019-12-10 | Discharge: 2019-12-10 | Disposition: A | Discharge status: Home / Self Care | Payer: Medicare HMO | Source: Ambulatory Visit | Attending: Internal Medicine | Admitting: Internal Medicine

## 2019-12-10 ENCOUNTER — Other Ambulatory Visit: Payer: Self-pay

## 2019-12-10 DIAGNOSIS — Z1231 Encounter for screening mammogram for malignant neoplasm of breast: Secondary | ICD-10-CM | POA: Insufficient documentation

## 2020-01-21 DIAGNOSIS — R7301 Impaired fasting glucose: Secondary | ICD-10-CM | POA: Diagnosis not present

## 2020-01-21 DIAGNOSIS — E782 Mixed hyperlipidemia: Secondary | ICD-10-CM | POA: Diagnosis not present

## 2020-01-21 DIAGNOSIS — R7303 Prediabetes: Secondary | ICD-10-CM | POA: Diagnosis not present

## 2020-01-21 DIAGNOSIS — Z124 Encounter for screening for malignant neoplasm of cervix: Secondary | ICD-10-CM | POA: Diagnosis not present

## 2020-01-21 DIAGNOSIS — U071 COVID-19: Secondary | ICD-10-CM | POA: Diagnosis not present

## 2020-01-21 DIAGNOSIS — R0602 Shortness of breath: Secondary | ICD-10-CM | POA: Diagnosis not present

## 2020-01-21 DIAGNOSIS — I10 Essential (primary) hypertension: Secondary | ICD-10-CM | POA: Diagnosis not present

## 2020-01-21 DIAGNOSIS — R05 Cough: Secondary | ICD-10-CM | POA: Diagnosis not present

## 2020-01-21 DIAGNOSIS — Z0000 Encounter for general adult medical examination without abnormal findings: Secondary | ICD-10-CM | POA: Diagnosis not present

## 2020-01-23 DIAGNOSIS — E782 Mixed hyperlipidemia: Secondary | ICD-10-CM | POA: Diagnosis not present

## 2020-01-23 DIAGNOSIS — R7301 Impaired fasting glucose: Secondary | ICD-10-CM | POA: Diagnosis not present

## 2020-01-23 DIAGNOSIS — R7303 Prediabetes: Secondary | ICD-10-CM | POA: Diagnosis not present

## 2020-01-23 DIAGNOSIS — I10 Essential (primary) hypertension: Secondary | ICD-10-CM | POA: Diagnosis not present

## 2020-01-23 DIAGNOSIS — Z6836 Body mass index (BMI) 36.0-36.9, adult: Secondary | ICD-10-CM | POA: Diagnosis not present

## 2020-01-23 DIAGNOSIS — E785 Hyperlipidemia, unspecified: Secondary | ICD-10-CM | POA: Diagnosis not present

## 2020-01-23 DIAGNOSIS — E7849 Other hyperlipidemia: Secondary | ICD-10-CM | POA: Diagnosis not present

## 2020-01-23 DIAGNOSIS — Z0001 Encounter for general adult medical examination with abnormal findings: Secondary | ICD-10-CM | POA: Diagnosis not present

## 2020-01-23 DIAGNOSIS — K219 Gastro-esophageal reflux disease without esophagitis: Secondary | ICD-10-CM | POA: Diagnosis not present

## 2020-01-26 DIAGNOSIS — R69 Illness, unspecified: Secondary | ICD-10-CM | POA: Diagnosis not present

## 2020-03-16 DIAGNOSIS — R0602 Shortness of breath: Secondary | ICD-10-CM | POA: Diagnosis not present

## 2020-03-16 DIAGNOSIS — E7849 Other hyperlipidemia: Secondary | ICD-10-CM | POA: Diagnosis not present

## 2020-03-16 DIAGNOSIS — U071 COVID-19: Secondary | ICD-10-CM | POA: Diagnosis not present

## 2020-03-16 DIAGNOSIS — R945 Abnormal results of liver function studies: Secondary | ICD-10-CM | POA: Diagnosis not present

## 2020-03-16 DIAGNOSIS — R05 Cough: Secondary | ICD-10-CM | POA: Diagnosis not present

## 2020-03-16 DIAGNOSIS — R7301 Impaired fasting glucose: Secondary | ICD-10-CM | POA: Diagnosis not present

## 2020-03-16 DIAGNOSIS — K219 Gastro-esophageal reflux disease without esophagitis: Secondary | ICD-10-CM | POA: Diagnosis not present

## 2020-03-16 DIAGNOSIS — R000 Tachycardia, unspecified: Secondary | ICD-10-CM | POA: Diagnosis not present

## 2020-03-16 DIAGNOSIS — I10 Essential (primary) hypertension: Secondary | ICD-10-CM | POA: Diagnosis not present

## 2020-03-16 DIAGNOSIS — R531 Weakness: Secondary | ICD-10-CM | POA: Diagnosis not present

## 2020-03-16 DIAGNOSIS — E782 Mixed hyperlipidemia: Secondary | ICD-10-CM | POA: Diagnosis not present

## 2020-03-16 DIAGNOSIS — R7303 Prediabetes: Secondary | ICD-10-CM | POA: Diagnosis not present

## 2020-03-30 DIAGNOSIS — I10 Essential (primary) hypertension: Secondary | ICD-10-CM | POA: Diagnosis not present

## 2020-03-30 DIAGNOSIS — R531 Weakness: Secondary | ICD-10-CM | POA: Diagnosis not present

## 2020-03-30 DIAGNOSIS — R000 Tachycardia, unspecified: Secondary | ICD-10-CM | POA: Diagnosis not present

## 2020-04-13 DIAGNOSIS — I679 Cerebrovascular disease, unspecified: Secondary | ICD-10-CM | POA: Diagnosis not present

## 2020-04-13 DIAGNOSIS — I10 Essential (primary) hypertension: Secondary | ICD-10-CM | POA: Diagnosis not present

## 2020-04-13 DIAGNOSIS — R000 Tachycardia, unspecified: Secondary | ICD-10-CM | POA: Diagnosis not present

## 2020-04-14 ENCOUNTER — Other Ambulatory Visit: Payer: Self-pay

## 2020-04-14 ENCOUNTER — Encounter (HOSPITAL_COMMUNITY): Payer: Self-pay | Admitting: Physical Therapy

## 2020-04-14 ENCOUNTER — Ambulatory Visit (HOSPITAL_COMMUNITY): Payer: Medicare HMO | Attending: Internal Medicine | Admitting: Physical Therapy

## 2020-04-14 DIAGNOSIS — M6281 Muscle weakness (generalized): Secondary | ICD-10-CM

## 2020-04-14 DIAGNOSIS — R2689 Other abnormalities of gait and mobility: Secondary | ICD-10-CM | POA: Diagnosis not present

## 2020-04-14 NOTE — Addendum Note (Signed)
Addended by: Josue Hector A on: 04/14/2020 11:16 AM   Modules accepted: Orders

## 2020-04-14 NOTE — Therapy (Signed)
[place], [place], [address] Phone: [phone]   Fax:  [phone]  Physical Therapy Evaluation  Patient Details  Name: Kristina [name] MRN: [id number] Date of Birth: 05-14-1951 Referring Provider (PT): Allyn Kenner MD    Encounter Date: 04/14/2020   PT End of Session - 04/14/20 1102    Visit Number 1    Number of Visits 4    Date for PT Re-Evaluation 05/14/20    Authorization Type [employer] Medicare HMO    Progress Note Due on Visit 4    PT Start Time 1030    PT Stop Time 1110    PT Time Calculation (min) 40 min    Activity Tolerance Patient tolerated treatment well    Behavior During Therapy [facility] for tasks assessed/performed           Past Medical History:  Diagnosis Date  . Hypertension     Past Surgical History:  Procedure Laterality Date  . COLONOSCOPY N/A 12/21/2016   Procedure: COLONOSCOPY;  Surgeon: Rogene Houston, MD;  [facility];  Service: Endoscopy;  Laterality: N/A;  930  . NO PAST SURGERIES      There were no vitals filed for this visit.    Subjective Assessment - 04/14/20 1039    Subjective Patient presents to physical therapy with complaint of generalized weakness. Says she had COVID in January and was in the hospital for 11 days. Says she feels she has not fully recovered her strength since then. Says she feels real week in her knees at times when walking. Patient says she has always had some trouble with her balance and been a bit wobbly, but notes this has also worsened since January.    Limitations Standing;Walking;House hold activities;Lifting    Patient Stated Goals walk better without giving out and strengthen legs    Currently in Pain? No/denies              [facility] PT Assessment - 04/14/20 0001      Assessment   Medical Diagnosis Weakness     Referring Provider (PT) Allyn Kenner MD     Onset Date/Surgical Date --   January 2021   Next MD Visit --   None scheduled      Prior Therapy No      Precautions   Precautions None      Restrictions   Weight Bearing Restrictions No      Balance Screen   Has the patient fallen in the past 6 months No    Has the patient had a decrease in activity level because of a fear of falling?  No    Is the patient reluctant to leave their home because of a fear of falling?  No      Home Environment   Living Environment Private residence    Living Arrangements Spouse/significant other    Available Help at Discharge Family    Type of [place] to enter    Entrance Stairs-Number of Steps 3    Entrance Stairs-Rails Can reach both    [place] One level      Prior Function   Level of Independence Independent      Cognition   Overall Cognitive Status Within Functional Limits for tasks assessed      ROM / Strength   AROM / PROM / Strength Strength      Strength   Strength Assessment  Site Hip;Knee;Ankle    Right/Left Hip Right;Left    Right Hip Flexion 4+/5    Right/Left Knee Right;Left    Right Knee Flexion 4+/5    Right Knee Extension 5/5    Left Knee Flexion 4+/5    Left Knee Extension 5/5    Right/Left Ankle Right;Left    Right Ankle Dorsiflexion 4+/5    Left Ankle Dorsiflexion 4+/5      Transfers   Five time sit to stand comments  10.91 sec with no UEs       Ambulation/Gait   Ambulation/Gait Yes    Ambulation/Gait Assistance 7: Independent    Ambulation Distance (Feet) 450 Feet    Assistive device None    Gait Pattern Trendelenburg    Ambulation Surface Level;Indoor    Gait Comments 2MWT      Balance   Balance Assessed Yes      Static Standing Balance   Static Standing Balance -  Activities  Tandam Stance - Right Leg;Tandam Stance - Left Leg    Static Standing - Comment/# of Minutes 30 sec min sway; 30 sec mod sway                       Objective measurements completed on examination: See above findings.       [facility] Adult PT Treatment/Exercise -  04/14/20 0001      Exercises   Exercises Knee/Hip      Knee/Hip Exercises: Standing   Heel Raises Both;10 reps    Other Standing Knee Exercises tandem stance 30" each intermittent HHA       Knee/Hip Exercises: Seated   Sit to Sand 5 reps;without UE support                  PT Education - 04/14/20 1042    Education Details on evaluation findings, POC and HEP    Person(s) Educated Patient    Methods Explanation;Handout    Comprehension Verbalized understanding            PT Short Term Goals - 04/14/20 1111      PT SHORT TERM GOAL #1   Title Patient will be independent with initial HEP and self-management strategies to improve functional outcomes    Time 1    Period Sprung    Status New    Target Date 04/23/20             PT Long Term Goals - 04/14/20 1111      PT LONG TERM GOAL #1   Title Patient will be independent with advanced HEP and self-management strategies to improve functional outcomes    Time 4    Period Jump    Status New    Target Date 05/14/20      PT LONG TERM GOAL #2   Title Patient will be able to maintain single limb stance 10 seconds on BLEs to improve stability and reduce risk for falls    Time 4    Period Feister    Status New    Target Date 05/14/20      PT LONG TERM GOAL #3   Title Patient will report at least 75% overall improvement in subjective complaint to indicate improvement in ability to perform ADLs.    Time 4    Period Meech    Status New    Target Date 05/14/20  Plan - 04/14/20 1108    Clinical Impression Statement Patient is a 69 y.o. female who presents to physical therapy with complaint of general weakness. Patient demonstrates mild decrease in strength, slight balance deficits and gait abnormalities which are negatively impacting patient ability to perform ADLs and functional mobility tasks. Patient will benefit from skilled physical therapy services to address these deficits to improve  level of function with ADLs, functional mobility tasks, and reduce risk for falls.    Examination-Activity Limitations Stairs;Squat;Carry;Locomotion Level;Stand    Examination-Participation Restrictions Yard Work;Laundry;Community Activity;Cleaning    Stability/Clinical Decision Making Stable/Uncomplicated    Clinical Decision Making Low    Rehab Potential Good    PT Frequency 1x / week    PT Duration 4 [place]    PT Treatment/Interventions ADLs/Self Care Home Management;Aquatic Therapy;Electrical Stimulation;Cryotherapy;Fluidtherapy;Contrast Bath;Therapeutic activities;Therapeutic exercise;Functional mobility training;Ultrasound;Parrafin;Traction;Stair training;Moist Heat;Gait training;DME Instruction;Iontophoresis 4mg /ml Dexamethasone;Balance training;Neuromuscular re-education;Scar mobilization;Passive range of motion;Dry needling;Manual techniques;Cognitive remediation;Patient/family education;Manual lymph drainage;Energy conservation;Spinal Manipulations;Joint Manipulations;Splinting;Taping;Compression bandaging;Orthotic Fit/Training;Vasopneumatic Device    PT Next Visit Plan Review goals and HEP. Progress functional strength and balance with emphasis on progressing HEP weekly    PT Home Exercise Plan 04/14/20: heel raise, sit to stand, tande stance with support    Consulted and Agree with Plan of Care Patient           Patient will benefit from skilled therapeutic intervention in order to improve the following deficits and impairments:  Abnormal gait, Decreased activity tolerance, Decreased endurance, Decreased balance, Difficulty walking, Decreased strength  Visit Diagnosis: Muscle weakness (generalized)  Other abnormalities of gait and mobility     Problem List Patient Active Problem List   Diagnosis Date Noted  . Essential hypertension 10/08/2019  . Uncontrolled diabetes mellitus ([place]) 10/08/2019  . UTI (urinary tract infection) 10/08/2019  . Thrush 10/08/2019  . Pneumonia due  to COVID-19 virus 10/08/2019  . Acute respiratory failure with hypoxia and hypercapnia ([place]) 10/02/2019  . Acute respiratory distress syndrome (ARDS) due to COVID-19 virus ([place]) 10/02/2019  . Special screening for malignant neoplasms, colon 11/06/2016    11:14 AM, 04/14/20 Josue Hector PT DPT  Physical Therapist with [facility]  [phone] [place] [address] [place], [place], [address] Phone: [phone]   Fax:  [phone]  Name: Kristina [name] MRN: [id number] Date of Birth: 06/23/1951

## 2020-04-20 ENCOUNTER — Other Ambulatory Visit: Payer: Self-pay

## 2020-04-20 ENCOUNTER — Encounter (HOSPITAL_COMMUNITY): Payer: Self-pay | Admitting: Physical Therapy

## 2020-04-20 ENCOUNTER — Ambulatory Visit (HOSPITAL_COMMUNITY): Payer: Medicare HMO | Admitting: Physical Therapy

## 2020-04-20 DIAGNOSIS — R2689 Other abnormalities of gait and mobility: Secondary | ICD-10-CM

## 2020-04-20 DIAGNOSIS — M6281 Muscle weakness (generalized): Secondary | ICD-10-CM

## 2020-04-20 NOTE — Patient Instructions (Signed)
Bridge    Lie back, legs bent. Inhale, pressing hips up. Keeping ribs in, lengthen lower back. Exhale, rolling down along spine from top. Repeat _10___ times. Do _1-2___ sessions per day.  http://pm.exer.us/55   Copyright  VHI. All rights reserved.  ABDUCTION: Standing - Resistance Band (Active)    Stand, feet flat. Against resistance band, lift right leg out to side. Complete _1__ sets of _10__ repetitions. Perform _1-2__ sessions per day.  http://gtsc.exer.us/117   Copyright  VHI. All rights reserved.  EXTENSION: Standing - Resistance Band (Active)    Stand, both feet flat. Against yellow resistance band, draw right leg behind body as far as possible. Complete _1__ sets of __10_ repetitions. Perform _1-2__ sessions per day.  http://gtsc.exer.us/83   Copyright  VHI. All rights reserved.

## 2020-04-20 NOTE — Therapy (Signed)
[place], [place], [address] Phone: [phone]   Fax:  [phone]  Physical Therapy Treatment  Patient Details  Name: Kristina [name] MRN: [id number] Date of Birth: Apr 29, 1951 Referring Provider (PT): Allyn Kenner MD    Encounter Date: 04/20/2020   PT End of Session - 04/20/20 1003    Visit Number 2    Number of Visits 4    Date for PT Re-Evaluation 05/14/20    Authorization Type [employer] Medicare HMO    Progress Note Due on Visit 4    PT Start Time [phone]    PT Stop Time 1024    PT Time Calculation (min) 38 min    Activity Tolerance Patient tolerated treatment well    Behavior During Therapy [facility] for tasks assessed/performed           Past Medical History:  Diagnosis Date  . Hypertension     Past Surgical History:  Procedure Laterality Date  . COLONOSCOPY N/A 12/21/2016   Procedure: COLONOSCOPY;  Surgeon: Rogene Houston, MD;  [facility];  Service: Endoscopy;  Laterality: N/A;  930  . NO PAST SURGERIES      There were no vitals filed for this visit.   Subjective Assessment - 04/20/20 0950    Subjective Patient denied any pain currently, but stated that she is going to [place] in the fall and wants to improve her strength and endurance.    Limitations Standing;Walking;House hold activities;Lifting    Patient Stated Goals walk better without giving out and strengthen legs    Currently in Pain? No/denies                             [facility] Adult PT Treatment/Exercise - 04/20/20 0001      Knee/Hip Exercises: Standing   Heel Raises Both;10 reps    Hip Abduction Stengthening;Right;Left;1 set;10 reps;Knee straight    Abduction Limitations RTB    Hip Extension Stengthening;Right;Left;1 set;Knee straight;10 reps    Extension Limitations RTB    Lateral Step Up Right;Left;1 set;10 reps;Hand Hold: 2;Step Height: 6"    Forward Step Up Right;Left;1 set;10 reps;Step Height: 6";Hand  Hold: 2    Functional Squat 1 set;15 reps    Functional Squat Limitations Intermittent HHA    Other Standing Knee Exercises tandem stance 2x30" each intermittent HHA       Knee/Hip Exercises: Seated   Sit to Sand 5 reps;without UE support;2 sets   2nd set with RTB around knees to reduce knee valgus     Knee/Hip Exercises: Supine   Bridges Strengthening;1 set;10 reps    Straight Leg Raises Strengthening;Right;Left;1 set;10 reps    Straight Leg Raises Limitations 1# ankle weight      Knee/Hip Exercises: Sidelying   Clams 10x each LE                    PT Short Term Goals - 04/20/20 1005      PT SHORT TERM GOAL #1   Title Patient will be independent with initial HEP and self-management strategies to improve functional outcomes    Time 1    Period [place]    Status On-going    Target Date 04/23/20             PT Long Term Goals - 04/20/20 1005      PT LONG TERM GOAL #1   Title Patient will be  independent with advanced HEP and self-management strategies to improve functional outcomes    Time 4    Period [place]    Status On-going      PT LONG TERM GOAL #2   Title Patient will be able to maintain single limb stance 10 seconds on BLEs to improve stability and reduce risk for falls    Time 4    Period K[place]    Status On-going      PT LONG TERM GOAL #3   Title Patient will report at least 75% overall improvement in subjective complaint to indicate improvement in ability to perform ADLs.    Time 4    Period Rom[place]    Status On-going                 Plan - 04/20/20 1020    Clinical Impression Statement Focused on lower extremity strengthening exercises this session. Noted knee valgus with squats initially this session so used a red band around patient's thighs to reduce knee valgus and noted improvement. Educated patient on additional HEP including standing hip extension and abduction with a red band as well as supine bridges. Patient would benefit from  continued skilled physical therapy to progress towards achieving functional goals.    Examination-Activity Limitations Stairs;Squat;Carry;Locomotion Level;Stand    Examination-Participation Restrictions Yard Work;Laundry;Community Activity;Cleaning    Stability/Clinical Decision Making Stable/Uncomplicated    Rehab Potential Good    PT Frequency 1x / week    PT Duration 4 [place]    PT Treatment/Interventions ADLs/Self Care Home Management;Aquatic Therapy;Electrical Stimulation;Cryotherapy;Fluidtherapy;Contrast Bath;Therapeutic activities;Therapeutic exercise;Functional mobility training;Ultrasound;Parrafin;Traction;Stair training;Moist Heat;Gait training;DME Instruction;Iontophoresis 4mg /ml Dexamethasone;Balance training;Neuromuscular re-education;Scar mobilization;Passive range of motion;Dry needling;Manual techniques;Cognitive remediation;Patient/family education;Manual lymph drainage;Energy conservation;Spinal Manipulations;Joint Manipulations;Splinting;Taping;Compression bandaging;Orthotic Fit/Training;Vasopneumatic Device    PT Next Visit Plan Progress functional strength and balance with emphasis on progressing HEP weekly    PT Home Exercise Plan 04/14/20: heel raise, sit to stand, tande stance with support; 04/20/20: Standing hip extension and hip abduction with RTB, supine bridge    Consulted and Agree with Plan of Care Patient           Patient will benefit from skilled therapeutic intervention in order to improve the following deficits and impairments:  Abnormal gait, Decreased activity tolerance, Decreased endurance, Decreased balance, Difficulty walking, Decreased strength  Visit Diagnosis: Muscle weakness (generalized)  Other abnormalities of gait and mobility     Problem List Patient Active Problem List   Diagnosis Date Noted  . Essential hypertension 10/08/2019  . Uncontrolled diabetes mellitus ([place]) 10/08/2019  . UTI (urinary tract infection) 10/08/2019  . Thrush  10/08/2019  . Pneumonia due to COVID-19 virus 10/08/2019  . Acute respiratory failure with hypoxia and hypercapnia ([place]) 10/02/2019  . Acute respiratory distress syndrome (ARDS) due to COVID-19 virus ([place]) 10/02/2019  . Special screening for malignant neoplasms, colon 11/06/2016   Clarene Critchley PT, DPT 10:27 AM, 04/20/20 [place], [place], [address] Phone: [phone]   Fax:  [phone]  Name: Kristina [name] MRN: [id number] Date of Birth: November 11, 1950

## 2020-04-28 ENCOUNTER — Other Ambulatory Visit: Payer: Self-pay

## 2020-04-28 ENCOUNTER — Ambulatory Visit (HOSPITAL_COMMUNITY): Payer: Medicare HMO | Attending: Internal Medicine | Admitting: Physical Therapy

## 2020-04-28 ENCOUNTER — Encounter (HOSPITAL_COMMUNITY): Payer: Self-pay | Admitting: Physical Therapy

## 2020-04-28 DIAGNOSIS — M6281 Muscle weakness (generalized): Secondary | ICD-10-CM | POA: Insufficient documentation

## 2020-04-28 DIAGNOSIS — R2689 Other abnormalities of gait and mobility: Secondary | ICD-10-CM | POA: Insufficient documentation

## 2020-04-28 NOTE — Therapy (Signed)
[place], [place], [address] Phone: [phone]   Fax:  [phone]  Physical Therapy Treatment  Patient Details  Name: Kristina [name] MRN: [id number] Date of Birth: 02/16/1951 Referring Provider (PT): Allyn Kenner MD    Encounter Date: 04/28/2020   PT End of Session - 04/28/20 0953    Visit Number 3    Number of Visits 4    Date for PT Re-Evaluation 05/14/20    Authorization Type [employer] Medicare HMO    Progress Note Due on Visit 4    PT Start Time 0950    PT Stop Time 1030    PT Time Calculation (min) 40 min    Activity Tolerance Patient tolerated treatment well    Behavior During Therapy [facility] for tasks assessed/performed           Past Medical History:  Diagnosis Date  . Hypertension     Past Surgical History:  Procedure Laterality Date  . COLONOSCOPY N/A 12/21/2016   Procedure: COLONOSCOPY;  Surgeon: Rogene Houston, MD;  [facility];  Service: Endoscopy;  Laterality: N/A;  930  . NO PAST SURGERIES      There were no vitals filed for this visit.   Subjective Assessment - 04/28/20 0952    Subjective Patient reports without complaint. HEP is going good    Limitations Standing;Walking;House hold activities;Lifting    Patient Stated Goals walk better without giving out and strengthen legs    Currently in Pain? No/denies                             [facility] Adult PT Treatment/Exercise - 04/28/20 0001      Knee/Hip Exercises: Machines for Strengthening   Other Machine machine walkout 30# x10       Knee/Hip Exercises: Standing   Heel Raises Both;2 sets;10 reps    Knee Flexion Both;2 sets;10 reps    Knee Flexion Limitations 2#    Hip Abduction Both;2 sets;10 reps    Abduction Limitations 2#    Hip Extension Both;2 sets;10 reps;Knee straight    Extension Limitations 2#    Lateral Step Up Both;2 sets;10 reps;Hand Hold: 1;Step Height: 6"    Forward Step Up Both;2 sets;10  reps;Hand Hold: 1;Step Height: 6"    Other Standing Knee Exercises tandem stance 2x30" each solid floor; 1 x 30" each on foam     Other Standing Knee Exercises sidestepping 2RT       Knee/Hip Exercises: Seated   Sit to Sand 2 sets;10 reps;without UE support                    PT Short Term Goals - 04/20/20 1005      PT SHORT TERM GOAL #1   Title Patient will be independent with initial HEP and self-management strategies to improve functional outcomes    Time 1    Period [place]    Status On-going    Target Date 04/23/20             PT Long Term Goals - 04/20/20 1005      PT LONG TERM GOAL #1   Title Patient will be independent with advanced HEP and self-management strategies to improve functional outcomes    Time 4    Period [place]    Status On-going      PT LONG TERM GOAL #2   Title Patient  will be able to maintain single limb stance 10 seconds on BLEs to improve stability and reduce risk for falls    Time 4    Period [place]    Status On-going      PT LONG TERM GOAL #3   Title Patient will report at least 75% overall improvement in subjective complaint to indicate improvement in ability to perform ADLs.    Time 4    Period [place]    Status On-going                 Plan - 04/28/20 1026    Clinical Impression Statement Patient tolerated ther ex progressions well today. Patient able to increase resistance and reps with standing LE strengthening exercises with minimal fatigue. Added standing knee flexion, sidestepping and machine walkouts for progressing tolerance to functional activity. Patient was challenged with added foam pad to static tandem standing. Patient educated on and issued updated HEP handout.    Examination-Activity Limitations Stairs;Squat;Carry;Locomotion Level;Stand    Examination-Participation Restrictions Yard Work;Laundry;Community Activity;Cleaning    Stability/Clinical Decision Making Stable/Uncomplicated    Rehab Potential Good    PT  Frequency 1x / week    PT Duration 4 [place]    PT Treatment/Interventions ADLs/Self Care Home Management;Aquatic Therapy;Electrical Stimulation;Cryotherapy;Fluidtherapy;Contrast Bath;Therapeutic activities;Therapeutic exercise;Functional mobility training;Ultrasound;Parrafin;Traction;Stair training;Moist Heat;Gait training;DME Instruction;Iontophoresis 4mg /ml Dexamethasone;Balance training;Neuromuscular re-education;Scar mobilization;Passive range of motion;Dry needling;Manual techniques;Cognitive remediation;Patient/family education;Manual lymph drainage;Energy conservation;Spinal Manipulations;Joint Manipulations;Splinting;Taping;Compression bandaging;Orthotic Fit/Training;Vasopneumatic Device    PT Next Visit Plan Progress functional strength and balance with emphasis on progressing HEP weekly. Reassess next visit, likely DC to HEP. Discuss use of machines for strength and possible transition to silver sneakers program    PT Home Exercise Plan 04/14/20: heel raise, sit to stand, tande stance with support; 04/20/20: Standing hip extension and hip abduction with RTB, supine bridge    Consulted and Agree with Plan of Care Patient           Patient will benefit from skilled therapeutic intervention in order to improve the following deficits and impairments:  Abnormal gait, Decreased activity tolerance, Decreased endurance, Decreased balance, Difficulty walking, Decreased strength  Visit Diagnosis: Muscle weakness (generalized)  Other abnormalities of gait and mobility     Problem List Patient Active Problem List   Diagnosis Date Noted  . Essential hypertension 10/08/2019  . Uncontrolled diabetes mellitus ([place]) 10/08/2019  . UTI (urinary tract infection) 10/08/2019  . Thrush 10/08/2019  . Pneumonia due to COVID-19 virus 10/08/2019  . Acute respiratory failure with hypoxia and hypercapnia ([place]) 10/02/2019  . Acute respiratory distress syndrome (ARDS) due to COVID-19 virus ([place]) 10/02/2019  .  Special screening for malignant neoplasms, colon 11/06/2016    10:31 AM, 04/28/20 Josue Hector PT DPT  Physical Therapist with [facility]  [phone] [place] [address] [place], [place], [address] Phone: [phone]   Fax:  [phone]  Name: Kristina [name] MRN: [id number] Date of Birth: [birth year]

## 2020-04-28 NOTE — Patient Instructions (Signed)
Access Code: [email or web address] URL: [email or web address] Date: 04/28/2020 Prepared by: Josue Hector  Exercises Side Stepping with Counter Support - 2 x daily - 7 x weekly - 1 sets - 10 reps Standing Knee Flexion AROM with Chair Support - 2 x daily - 7 x weekly - 2 sets - 10 reps

## 2020-05-04 ENCOUNTER — Other Ambulatory Visit: Payer: Self-pay

## 2020-05-04 ENCOUNTER — Ambulatory Visit (HOSPITAL_COMMUNITY): Payer: Medicare HMO | Admitting: Physical Therapy

## 2020-05-04 DIAGNOSIS — M6281 Muscle weakness (generalized): Secondary | ICD-10-CM | POA: Diagnosis not present

## 2020-05-04 DIAGNOSIS — R2689 Other abnormalities of gait and mobility: Secondary | ICD-10-CM | POA: Diagnosis not present

## 2020-05-04 NOTE — Therapy (Addendum)
[place], [place], [address] Phone: [phone]   Fax:  [phone]  Physical Therapy Treatment  Patient Details  Name: Kristina [name] MRN: [id number] Date of Birth: 04/25/1951 Referring Provider (PT): Allyn Kenner MD   PHYSICAL THERAPY DISCHARGE SUMMARY  Visits from Start of Care: 4  Current functional level related to goals / functional outcomes: See below   Remaining deficits: See below    Education / Equipment: See assessment  Plan: Patient agrees to discharge.  Patient goals were met. Patient is being discharged due to meeting the stated rehab goals.  ?????       Encounter Date: 05/04/2020   PT End of Session - 05/04/20 1441    Visit Number 4    Number of Visits 4    Date for PT Re-Evaluation 05/14/20    Authorization Type [employer] Medicare HMO    Progress Note Due on Visit 4    PT Start Time 1404    PT Stop Time 1430    PT Time Calculation (min) 26 min    Activity Tolerance Patient tolerated treatment well    Behavior During Therapy WFL for tasks assessed/performed           Past Medical History:  Diagnosis Date  . Hypertension     Past Surgical History:  Procedure Laterality Date  . COLONOSCOPY N/A 12/21/2016   Procedure: COLONOSCOPY;  Surgeon: Rogene Houston, MD;  [facility];  Service: Endoscopy;  Laterality: N/A;  930  . NO PAST SURGERIES      There were no vitals filed for this visit.       [facility] PT Assessment - 05/04/20 1405      Assessment   Medical Diagnosis Weakness     Referring Provider (PT) Allyn Kenner MD     Onset Date/Surgical Date --   January 2021   Next MD Visit --   None scheduled    Prior Therapy No      Precautions   Precautions None      Restrictions   Weight Bearing Restrictions No      Home Environment   Living Environment Private residence    Living Arrangements Spouse/significant other    Available Help at Discharge Family    Type  of [place] to enter    Entrance Stairs-Number of Steps 3    Entrance Stairs-Rails Can reach both    [place] One level      Prior Function   Level of Independence Independent      Cognition   Overall Cognitive Status Within Functional Limits for tasks assessed      Strength   Right Hip Flexion 5/5   was 4+/5   Left Hip Flexion 5/5    Right Knee Flexion 5/5   was 4+/5   Right Knee Extension 5/5   was 5/5   Left Knee Flexion 5/5   was 4+/5   Left Knee Extension 5/5   was 5/5   Right Ankle Dorsiflexion 5/5   was 4+/5   Left Ankle Dorsiflexion 5/5   was 4+/5     Ambulation/Gait   Ambulation/Gait Yes    Ambulation/Gait Assistance 7: Independent    Assistive device None    Gait Pattern Trendelenburg    Gait Comments 2MWT      Balance   Balance Assessed Yes      Static Standing Balance  Static Standing Balance -  Activities  Tandam Stance - Right Leg;Tandam Stance - Left Leg                         [facility] Adult PT Treatment/Exercise - 05/04/20 1405      Transfers   Five time sit to stand comments  6.85 sec with no UEs    was 10.91 without UE's     Ambulation/Gait   Ambulation Distance (Feet) 450 Feet   was 450                 PT Education - 05/04/20 1444    Education Details Review of HEP    Person(s) Educated Patient    Methods Explanation    Comprehension Returned demonstration            PT Short Term Goals - 05/04/20 1413      PT SHORT TERM GOAL #1   Title Patient will be independent with initial HEP and self-management strategies to improve functional outcomes    Time 1    Period [place]    Status Achieved    Target Date 04/23/20             PT Long Term Goals - 05/04/20 1416      PT LONG TERM GOAL #1   Title Patient will be independent with advanced HEP and self-management strategies to improve functional outcomes    Time 4    Period [place]    Status Achieved      PT LONG TERM GOAL #2   Title  Patient will be able to maintain single limb stance 10 seconds on BLEs to improve stability and reduce risk for falls    Time 4    Period [place]    Status Achieved      PT LONG TERM GOAL #3   Title Patient will report at least 75% overall improvement in subjective complaint to indicate improvement in ability to perform ADLs.    Time 4    Period [place]    Status Achieved                 Plan - 05/04/20 1442    Clinical Impression Statement Retested strength via MMT and functional sit to stands with improvements noted and all now [facility].  Pt able to complete 2MWT without fatigue or exertion as previously and has now met all goals.  Pt has advanced HEP and is compliant with these.  Pt voices no additional needs or deficits and is agreeable to discharge at this time.    Examination-Activity Limitations Stairs;Squat;Carry;Locomotion Level;Stand    Examination-Participation Restrictions Yard Work;Laundry;Community Activity;Cleaning    Stability/Clinical Decision Making Stable/Uncomplicated    Rehab Potential Good    PT Frequency 1x / week    PT Duration 4 [place]    PT Treatment/Interventions ADLs/Self Care Home Management;Aquatic Therapy;Electrical Stimulation;Cryotherapy;Fluidtherapy;Contrast Bath;Therapeutic activities;Therapeutic exercise;Functional mobility training;Ultrasound;Parrafin;Traction;Stair training;Moist Heat;Gait training;DME Instruction;Iontophoresis 37m/ml Dexamethasone;Balance training;Neuromuscular re-education;[place]ar mobilization;Passive range of motion;Dry needling;Manual techniques;Cognitive remediation;Patient/family education;Manual lymph drainage;Energy conservation;Spinal Manipulations;Joint Manipulations;Splinting;Taping;Compression bandaging;Orthotic Fit/Training;Vasopneumatic Device    PT Next Visit Plan discharge to HEP.    PT Home Exercise Plan 04/14/20: heel raise, sit to stand, tande stance with support; 04/20/20: Standing hip extension and hip abduction with RTB,  supine bridge    Consulted and Agree with Plan of Care Patient           Patient will benefit from skilled therapeutic intervention in order to improve  the following deficits and impairments:  Abnormal gait, Decreased activity tolerance, Decreased endurance, Decreased balance, Difficulty walking, Decreased strength  Visit Diagnosis: Other abnormalities of gait and mobility  Muscle weakness (generalized)     Problem List Patient Active Problem List   Diagnosis Date Noted  . Essential hypertension 10/08/2019  . Uncontrolled diabetes mellitus ([place]) 10/08/2019  . UTI (urinary tract infection) 10/08/2019  . Thrush 10/08/2019  . Pneumonia due to COVID-19 virus 10/08/2019  . Acute respiratory failure with hypoxia and hypercapnia ([place]) 10/02/2019  . Acute respiratory distress syndrome (ARDS) due to COVID-19 virus ([place]) 10/02/2019  . Special screening for malignant neoplasms, colon 11/06/2016   Teena Irani, PTA/CLT [phone]  Teena Irani 05/04/2020, 2:45 PM   3:11 PM, 05/04/20 Josue Hector PT DPT  Physical Therapist with [facility]  [phone]   [facility] [address] [place], [place], [address] Phone: [phone]   Fax:  [phone]  Name: Kristina [name] MRN: [id number] Date of Birth: Mar 13, 1951

## 2020-05-18 ENCOUNTER — Encounter (HOSPITAL_COMMUNITY): Payer: Medicare HMO | Admitting: Physical Therapy

## 2020-05-20 DIAGNOSIS — I10 Essential (primary) hypertension: Secondary | ICD-10-CM | POA: Diagnosis not present

## 2020-05-20 DIAGNOSIS — M25562 Pain in left knee: Secondary | ICD-10-CM | POA: Diagnosis not present

## 2020-05-20 DIAGNOSIS — M25552 Pain in left hip: Secondary | ICD-10-CM | POA: Insufficient documentation

## 2020-05-20 DIAGNOSIS — N1831 Chronic kidney disease, stage 3a: Secondary | ICD-10-CM | POA: Diagnosis not present

## 2020-06-17 DIAGNOSIS — M25562 Pain in left knee: Secondary | ICD-10-CM | POA: Diagnosis not present

## 2020-07-03 DIAGNOSIS — M25562 Pain in left knee: Secondary | ICD-10-CM | POA: Diagnosis not present

## 2020-07-14 DIAGNOSIS — E559 Vitamin D deficiency, unspecified: Secondary | ICD-10-CM | POA: Diagnosis not present

## 2020-07-14 DIAGNOSIS — E782 Mixed hyperlipidemia: Secondary | ICD-10-CM | POA: Diagnosis not present

## 2020-07-14 DIAGNOSIS — U071 COVID-19: Secondary | ICD-10-CM | POA: Diagnosis not present

## 2020-07-14 DIAGNOSIS — K219 Gastro-esophageal reflux disease without esophagitis: Secondary | ICD-10-CM | POA: Diagnosis not present

## 2020-07-14 DIAGNOSIS — R000 Tachycardia, unspecified: Secondary | ICD-10-CM | POA: Diagnosis not present

## 2020-07-14 DIAGNOSIS — Z0000 Encounter for general adult medical examination without abnormal findings: Secondary | ICD-10-CM | POA: Diagnosis not present

## 2020-07-14 DIAGNOSIS — R7301 Impaired fasting glucose: Secondary | ICD-10-CM | POA: Diagnosis not present

## 2020-07-14 DIAGNOSIS — Z0001 Encounter for general adult medical examination with abnormal findings: Secondary | ICD-10-CM | POA: Diagnosis not present

## 2020-07-14 DIAGNOSIS — E7849 Other hyperlipidemia: Secondary | ICD-10-CM | POA: Diagnosis not present

## 2020-07-14 DIAGNOSIS — I10 Essential (primary) hypertension: Secondary | ICD-10-CM | POA: Diagnosis not present

## 2020-07-20 DIAGNOSIS — M25562 Pain in left knee: Secondary | ICD-10-CM | POA: Diagnosis not present

## 2020-07-20 DIAGNOSIS — E782 Mixed hyperlipidemia: Secondary | ICD-10-CM | POA: Diagnosis not present

## 2020-07-20 DIAGNOSIS — R7303 Prediabetes: Secondary | ICD-10-CM | POA: Diagnosis not present

## 2020-07-20 DIAGNOSIS — K219 Gastro-esophageal reflux disease without esophagitis: Secondary | ICD-10-CM | POA: Diagnosis not present

## 2020-07-20 DIAGNOSIS — Z6836 Body mass index (BMI) 36.0-36.9, adult: Secondary | ICD-10-CM | POA: Diagnosis not present

## 2020-07-20 DIAGNOSIS — I10 Essential (primary) hypertension: Secondary | ICD-10-CM | POA: Diagnosis not present

## 2020-07-29 DIAGNOSIS — R69 Illness, unspecified: Secondary | ICD-10-CM | POA: Diagnosis not present

## 2020-07-30 DIAGNOSIS — S83242A Other tear of medial meniscus, current injury, left knee, initial encounter: Secondary | ICD-10-CM | POA: Diagnosis not present

## 2020-07-30 DIAGNOSIS — S83282A Other tear of lateral meniscus, current injury, left knee, initial encounter: Secondary | ICD-10-CM | POA: Diagnosis not present

## 2020-08-28 DIAGNOSIS — E782 Mixed hyperlipidemia: Secondary | ICD-10-CM | POA: Diagnosis not present

## 2020-08-28 DIAGNOSIS — I10 Essential (primary) hypertension: Secondary | ICD-10-CM | POA: Diagnosis not present

## 2020-08-28 DIAGNOSIS — M25562 Pain in left knee: Secondary | ICD-10-CM | POA: Diagnosis not present

## 2020-08-28 DIAGNOSIS — R7303 Prediabetes: Secondary | ICD-10-CM | POA: Diagnosis not present

## 2020-08-28 DIAGNOSIS — K219 Gastro-esophageal reflux disease without esophagitis: Secondary | ICD-10-CM | POA: Diagnosis not present

## 2020-08-28 DIAGNOSIS — Z6836 Body mass index (BMI) 36.0-36.9, adult: Secondary | ICD-10-CM | POA: Diagnosis not present

## 2020-08-30 ENCOUNTER — Other Ambulatory Visit: Payer: Self-pay | Admitting: Internal Medicine

## 2020-08-30 ENCOUNTER — Other Ambulatory Visit: Payer: Self-pay

## 2020-08-30 ENCOUNTER — Ambulatory Visit (HOSPITAL_COMMUNITY)
Admission: RE | Admit: 2020-08-30 | Discharge: 2020-08-30 | Disposition: A | Discharge status: Home / Self Care | Payer: Medicare HMO | Source: Ambulatory Visit | Attending: Internal Medicine | Admitting: Internal Medicine

## 2020-08-30 DIAGNOSIS — M79605 Pain in left leg: Secondary | ICD-10-CM | POA: Diagnosis not present

## 2020-08-30 DIAGNOSIS — M7989 Other specified soft tissue disorders: Secondary | ICD-10-CM | POA: Diagnosis not present

## 2020-09-08 DIAGNOSIS — M79605 Pain in left leg: Secondary | ICD-10-CM | POA: Diagnosis not present

## 2020-10-12 DIAGNOSIS — S83232A Complex tear of medial meniscus, current injury, left knee, initial encounter: Secondary | ICD-10-CM | POA: Diagnosis not present

## 2020-10-12 DIAGNOSIS — M2242 Chondromalacia patellae, left knee: Secondary | ICD-10-CM | POA: Diagnosis not present

## 2020-10-12 DIAGNOSIS — Y999 Unspecified external cause status: Secondary | ICD-10-CM | POA: Diagnosis not present

## 2020-10-12 DIAGNOSIS — X58XXXA Exposure to other specified factors, initial encounter: Secondary | ICD-10-CM | POA: Diagnosis not present

## 2020-10-12 DIAGNOSIS — S83272A Complex tear of lateral meniscus, current injury, left knee, initial encounter: Secondary | ICD-10-CM | POA: Diagnosis not present

## 2020-10-12 DIAGNOSIS — M94262 Chondromalacia, left knee: Secondary | ICD-10-CM | POA: Diagnosis not present

## 2020-10-12 DIAGNOSIS — M659 Synovitis and tenosynovitis, unspecified: Secondary | ICD-10-CM | POA: Diagnosis not present

## 2020-10-13 DIAGNOSIS — T8859XA Other complications of anesthesia, initial encounter: Secondary | ICD-10-CM | POA: Diagnosis not present

## 2020-10-13 DIAGNOSIS — R0902 Hypoxemia: Secondary | ICD-10-CM | POA: Diagnosis not present

## 2020-10-14 DIAGNOSIS — T8859XA Other complications of anesthesia, initial encounter: Secondary | ICD-10-CM | POA: Diagnosis not present

## 2020-10-14 DIAGNOSIS — R0902 Hypoxemia: Secondary | ICD-10-CM | POA: Diagnosis not present

## 2020-11-17 ENCOUNTER — Other Ambulatory Visit (HOSPITAL_COMMUNITY): Payer: Self-pay | Admitting: Internal Medicine

## 2020-11-17 DIAGNOSIS — Z1231 Encounter for screening mammogram for malignant neoplasm of breast: Secondary | ICD-10-CM

## 2020-12-15 ENCOUNTER — Ambulatory Visit (HOSPITAL_COMMUNITY)
Admission: RE | Admit: 2020-12-15 | Discharge: 2020-12-15 | Disposition: A | Discharge status: Home / Self Care | Payer: Medicare HMO | Source: Ambulatory Visit | Attending: Internal Medicine | Admitting: Internal Medicine

## 2020-12-15 DIAGNOSIS — Z1231 Encounter for screening mammogram for malignant neoplasm of breast: Secondary | ICD-10-CM | POA: Insufficient documentation

## 2021-01-19 DIAGNOSIS — R7301 Impaired fasting glucose: Secondary | ICD-10-CM | POA: Diagnosis not present

## 2021-01-19 DIAGNOSIS — R7303 Prediabetes: Secondary | ICD-10-CM | POA: Diagnosis not present

## 2021-01-19 DIAGNOSIS — E782 Mixed hyperlipidemia: Secondary | ICD-10-CM | POA: Diagnosis not present

## 2021-01-19 DIAGNOSIS — I10 Essential (primary) hypertension: Secondary | ICD-10-CM | POA: Diagnosis not present

## 2021-01-25 DIAGNOSIS — K219 Gastro-esophageal reflux disease without esophagitis: Secondary | ICD-10-CM | POA: Insufficient documentation

## 2021-01-25 DIAGNOSIS — R7301 Impaired fasting glucose: Secondary | ICD-10-CM | POA: Insufficient documentation

## 2021-01-25 DIAGNOSIS — E785 Hyperlipidemia, unspecified: Secondary | ICD-10-CM | POA: Insufficient documentation

## 2021-01-25 DIAGNOSIS — R7303 Prediabetes: Secondary | ICD-10-CM | POA: Insufficient documentation

## 2021-01-25 DIAGNOSIS — R000 Tachycardia, unspecified: Secondary | ICD-10-CM | POA: Insufficient documentation

## 2021-01-25 DIAGNOSIS — E782 Mixed hyperlipidemia: Secondary | ICD-10-CM | POA: Insufficient documentation

## 2021-01-26 DIAGNOSIS — K219 Gastro-esophageal reflux disease without esophagitis: Secondary | ICD-10-CM | POA: Diagnosis not present

## 2021-01-26 DIAGNOSIS — E785 Hyperlipidemia, unspecified: Secondary | ICD-10-CM | POA: Diagnosis not present

## 2021-01-26 DIAGNOSIS — I10 Essential (primary) hypertension: Secondary | ICD-10-CM | POA: Diagnosis not present

## 2021-01-26 DIAGNOSIS — R7303 Prediabetes: Secondary | ICD-10-CM | POA: Diagnosis not present

## 2021-02-22 DIAGNOSIS — M9901 Segmental and somatic dysfunction of cervical region: Secondary | ICD-10-CM | POA: Diagnosis not present

## 2021-02-22 DIAGNOSIS — M9905 Segmental and somatic dysfunction of pelvic region: Secondary | ICD-10-CM | POA: Diagnosis not present

## 2021-02-22 DIAGNOSIS — M542 Cervicalgia: Secondary | ICD-10-CM | POA: Diagnosis not present

## 2021-02-22 DIAGNOSIS — M25551 Pain in right hip: Secondary | ICD-10-CM | POA: Diagnosis not present

## 2021-02-22 DIAGNOSIS — M9902 Segmental and somatic dysfunction of thoracic region: Secondary | ICD-10-CM | POA: Diagnosis not present

## 2021-02-22 DIAGNOSIS — M9903 Segmental and somatic dysfunction of lumbar region: Secondary | ICD-10-CM | POA: Diagnosis not present

## 2021-02-22 DIAGNOSIS — M546 Pain in thoracic spine: Secondary | ICD-10-CM | POA: Diagnosis not present

## 2021-02-25 DIAGNOSIS — M9903 Segmental and somatic dysfunction of lumbar region: Secondary | ICD-10-CM | POA: Diagnosis not present

## 2021-02-25 DIAGNOSIS — M9901 Segmental and somatic dysfunction of cervical region: Secondary | ICD-10-CM | POA: Diagnosis not present

## 2021-02-25 DIAGNOSIS — M9905 Segmental and somatic dysfunction of pelvic region: Secondary | ICD-10-CM | POA: Diagnosis not present

## 2021-02-25 DIAGNOSIS — M25551 Pain in right hip: Secondary | ICD-10-CM | POA: Diagnosis not present

## 2021-02-25 DIAGNOSIS — M546 Pain in thoracic spine: Secondary | ICD-10-CM | POA: Diagnosis not present

## 2021-02-25 DIAGNOSIS — M9902 Segmental and somatic dysfunction of thoracic region: Secondary | ICD-10-CM | POA: Diagnosis not present

## 2021-02-25 DIAGNOSIS — M542 Cervicalgia: Secondary | ICD-10-CM | POA: Diagnosis not present

## 2021-03-02 DIAGNOSIS — M542 Cervicalgia: Secondary | ICD-10-CM | POA: Diagnosis not present

## 2021-03-02 DIAGNOSIS — M9901 Segmental and somatic dysfunction of cervical region: Secondary | ICD-10-CM | POA: Diagnosis not present

## 2021-03-02 DIAGNOSIS — M9905 Segmental and somatic dysfunction of pelvic region: Secondary | ICD-10-CM | POA: Diagnosis not present

## 2021-03-02 DIAGNOSIS — M9903 Segmental and somatic dysfunction of lumbar region: Secondary | ICD-10-CM | POA: Diagnosis not present

## 2021-03-02 DIAGNOSIS — M546 Pain in thoracic spine: Secondary | ICD-10-CM | POA: Diagnosis not present

## 2021-03-02 DIAGNOSIS — M25551 Pain in right hip: Secondary | ICD-10-CM | POA: Diagnosis not present

## 2021-03-02 DIAGNOSIS — M9902 Segmental and somatic dysfunction of thoracic region: Secondary | ICD-10-CM | POA: Diagnosis not present

## 2021-03-04 DIAGNOSIS — M546 Pain in thoracic spine: Secondary | ICD-10-CM | POA: Diagnosis not present

## 2021-03-04 DIAGNOSIS — M542 Cervicalgia: Secondary | ICD-10-CM | POA: Diagnosis not present

## 2021-03-04 DIAGNOSIS — M9905 Segmental and somatic dysfunction of pelvic region: Secondary | ICD-10-CM | POA: Diagnosis not present

## 2021-03-04 DIAGNOSIS — M9901 Segmental and somatic dysfunction of cervical region: Secondary | ICD-10-CM | POA: Diagnosis not present

## 2021-03-04 DIAGNOSIS — M9903 Segmental and somatic dysfunction of lumbar region: Secondary | ICD-10-CM | POA: Diagnosis not present

## 2021-03-04 DIAGNOSIS — M25551 Pain in right hip: Secondary | ICD-10-CM | POA: Diagnosis not present

## 2021-03-04 DIAGNOSIS — M9902 Segmental and somatic dysfunction of thoracic region: Secondary | ICD-10-CM | POA: Diagnosis not present

## 2021-03-11 DIAGNOSIS — M9905 Segmental and somatic dysfunction of pelvic region: Secondary | ICD-10-CM | POA: Diagnosis not present

## 2021-03-11 DIAGNOSIS — M5413 Radiculopathy, cervicothoracic region: Secondary | ICD-10-CM | POA: Diagnosis not present

## 2021-03-11 DIAGNOSIS — M9903 Segmental and somatic dysfunction of lumbar region: Secondary | ICD-10-CM | POA: Diagnosis not present

## 2021-03-11 DIAGNOSIS — M25551 Pain in right hip: Secondary | ICD-10-CM | POA: Diagnosis not present

## 2021-03-11 DIAGNOSIS — M9901 Segmental and somatic dysfunction of cervical region: Secondary | ICD-10-CM | POA: Diagnosis not present

## 2021-03-11 DIAGNOSIS — M9902 Segmental and somatic dysfunction of thoracic region: Secondary | ICD-10-CM | POA: Diagnosis not present

## 2021-03-11 DIAGNOSIS — M546 Pain in thoracic spine: Secondary | ICD-10-CM | POA: Diagnosis not present

## 2021-03-15 DIAGNOSIS — Z0100 Encounter for examination of eyes and vision without abnormal findings: Secondary | ICD-10-CM | POA: Diagnosis not present

## 2021-03-15 DIAGNOSIS — H524 Presbyopia: Secondary | ICD-10-CM | POA: Diagnosis not present

## 2021-03-18 DIAGNOSIS — M9902 Segmental and somatic dysfunction of thoracic region: Secondary | ICD-10-CM | POA: Diagnosis not present

## 2021-03-18 DIAGNOSIS — M9901 Segmental and somatic dysfunction of cervical region: Secondary | ICD-10-CM | POA: Diagnosis not present

## 2021-03-18 DIAGNOSIS — M546 Pain in thoracic spine: Secondary | ICD-10-CM | POA: Diagnosis not present

## 2021-03-18 DIAGNOSIS — M542 Cervicalgia: Secondary | ICD-10-CM | POA: Diagnosis not present

## 2021-03-18 DIAGNOSIS — M9903 Segmental and somatic dysfunction of lumbar region: Secondary | ICD-10-CM | POA: Diagnosis not present

## 2021-03-18 DIAGNOSIS — M9905 Segmental and somatic dysfunction of pelvic region: Secondary | ICD-10-CM | POA: Diagnosis not present

## 2021-03-18 DIAGNOSIS — M25551 Pain in right hip: Secondary | ICD-10-CM | POA: Diagnosis not present

## 2021-04-01 DIAGNOSIS — M25551 Pain in right hip: Secondary | ICD-10-CM | POA: Diagnosis not present

## 2021-04-01 DIAGNOSIS — M9903 Segmental and somatic dysfunction of lumbar region: Secondary | ICD-10-CM | POA: Diagnosis not present

## 2021-04-01 DIAGNOSIS — M9905 Segmental and somatic dysfunction of pelvic region: Secondary | ICD-10-CM | POA: Diagnosis not present

## 2021-04-01 DIAGNOSIS — M542 Cervicalgia: Secondary | ICD-10-CM | POA: Diagnosis not present

## 2021-04-01 DIAGNOSIS — M9901 Segmental and somatic dysfunction of cervical region: Secondary | ICD-10-CM | POA: Diagnosis not present

## 2021-04-01 DIAGNOSIS — M546 Pain in thoracic spine: Secondary | ICD-10-CM | POA: Diagnosis not present

## 2021-04-01 DIAGNOSIS — M9902 Segmental and somatic dysfunction of thoracic region: Secondary | ICD-10-CM | POA: Diagnosis not present

## 2021-04-05 ENCOUNTER — Encounter (HOSPITAL_COMMUNITY): Payer: Self-pay | Admitting: Emergency Medicine

## 2021-04-05 ENCOUNTER — Other Ambulatory Visit: Payer: Self-pay

## 2021-04-05 ENCOUNTER — Emergency Department (HOSPITAL_COMMUNITY): Payer: Medicare HMO

## 2021-04-05 ENCOUNTER — Inpatient Hospital Stay (HOSPITAL_COMMUNITY)
Admission: EM | Admit: 2021-04-05 | Discharge: 2021-04-09 | DRG: 291 | Disposition: A | Discharge status: Home / Self Care | Payer: Medicare HMO | Attending: Family Medicine | Admitting: Family Medicine

## 2021-04-05 DIAGNOSIS — E785 Hyperlipidemia, unspecified: Secondary | ICD-10-CM | POA: Diagnosis present

## 2021-04-05 DIAGNOSIS — Z6839 Body mass index (BMI) 39.0-39.9, adult: Secondary | ICD-10-CM | POA: Diagnosis not present

## 2021-04-05 DIAGNOSIS — R0600 Dyspnea, unspecified: Secondary | ICD-10-CM | POA: Insufficient documentation

## 2021-04-05 DIAGNOSIS — I517 Cardiomegaly: Secondary | ICD-10-CM | POA: Diagnosis not present

## 2021-04-05 DIAGNOSIS — I110 Hypertensive heart disease with heart failure: Secondary | ICD-10-CM | POA: Diagnosis not present

## 2021-04-05 DIAGNOSIS — J9602 Acute respiratory failure with hypercapnia: Secondary | ICD-10-CM | POA: Diagnosis not present

## 2021-04-05 DIAGNOSIS — I5023 Acute on chronic systolic (congestive) heart failure: Secondary | ICD-10-CM

## 2021-04-05 DIAGNOSIS — J9601 Acute respiratory failure with hypoxia: Secondary | ICD-10-CM | POA: Diagnosis present

## 2021-04-05 DIAGNOSIS — J189 Pneumonia, unspecified organism: Secondary | ICD-10-CM | POA: Diagnosis not present

## 2021-04-05 DIAGNOSIS — Z8616 Personal history of COVID-19: Secondary | ICD-10-CM

## 2021-04-05 DIAGNOSIS — R251 Tremor, unspecified: Secondary | ICD-10-CM | POA: Diagnosis not present

## 2021-04-05 DIAGNOSIS — Z7982 Long term (current) use of aspirin: Secondary | ICD-10-CM | POA: Diagnosis not present

## 2021-04-05 DIAGNOSIS — Z20822 Contact with and (suspected) exposure to covid-19: Secondary | ICD-10-CM | POA: Diagnosis not present

## 2021-04-05 DIAGNOSIS — R0902 Hypoxemia: Secondary | ICD-10-CM | POA: Diagnosis present

## 2021-04-05 DIAGNOSIS — Z79899 Other long term (current) drug therapy: Secondary | ICD-10-CM

## 2021-04-05 DIAGNOSIS — I5033 Acute on chronic diastolic (congestive) heart failure: Secondary | ICD-10-CM | POA: Diagnosis present

## 2021-04-05 DIAGNOSIS — I10 Essential (primary) hypertension: Secondary | ICD-10-CM | POA: Diagnosis not present

## 2021-04-05 DIAGNOSIS — R000 Tachycardia, unspecified: Secondary | ICD-10-CM | POA: Diagnosis not present

## 2021-04-05 DIAGNOSIS — E119 Type 2 diabetes mellitus without complications: Secondary | ICD-10-CM | POA: Diagnosis not present

## 2021-04-05 DIAGNOSIS — J9811 Atelectasis: Secondary | ICD-10-CM | POA: Diagnosis not present

## 2021-04-05 DIAGNOSIS — J811 Chronic pulmonary edema: Secondary | ICD-10-CM | POA: Diagnosis not present

## 2021-04-05 DIAGNOSIS — K219 Gastro-esophageal reflux disease without esophagitis: Secondary | ICD-10-CM | POA: Diagnosis present

## 2021-04-05 DIAGNOSIS — I5022 Chronic systolic (congestive) heart failure: Secondary | ICD-10-CM | POA: Diagnosis present

## 2021-04-05 DIAGNOSIS — E1165 Type 2 diabetes mellitus with hyperglycemia: Secondary | ICD-10-CM | POA: Diagnosis present

## 2021-04-05 DIAGNOSIS — IMO0002 Reserved for concepts with insufficient information to code with codable children: Secondary | ICD-10-CM | POA: Diagnosis present

## 2021-04-05 DIAGNOSIS — R0602 Shortness of breath: Secondary | ICD-10-CM | POA: Diagnosis not present

## 2021-04-05 DIAGNOSIS — I5043 Acute on chronic combined systolic (congestive) and diastolic (congestive) heart failure: Secondary | ICD-10-CM | POA: Diagnosis present

## 2021-04-05 DIAGNOSIS — E669 Obesity, unspecified: Secondary | ICD-10-CM | POA: Diagnosis not present

## 2021-04-05 DIAGNOSIS — E1169 Type 2 diabetes mellitus with other specified complication: Secondary | ICD-10-CM | POA: Diagnosis not present

## 2021-04-05 DIAGNOSIS — I5032 Chronic diastolic (congestive) heart failure: Secondary | ICD-10-CM | POA: Diagnosis present

## 2021-04-05 DIAGNOSIS — I5021 Acute systolic (congestive) heart failure: Secondary | ICD-10-CM | POA: Diagnosis not present

## 2021-04-05 LAB — CBC WITH DIFFERENTIAL/PLATELET
Abs Immature Granulocytes: 0.05 10*3/uL (ref 0.00–0.07)
Basophils Absolute: 0.1 10*3/uL (ref 0.0–0.1)
Basophils Relative: 1 %
Eosinophils Absolute: 0.1 10*3/uL (ref 0.0–0.5)
Eosinophils Relative: 2 %
HCT: 50.1 % — ABNORMAL HIGH (ref 36.0–46.0)
Hemoglobin: 15 g/dL (ref 12.0–15.0)
Immature Granulocytes: 1 %
Lymphocytes Relative: 19 %
Lymphs Abs: 1.7 10*3/uL (ref 0.7–4.0)
MCH: 28.4 pg (ref 26.0–34.0)
MCHC: 29.9 g/dL — ABNORMAL LOW (ref 30.0–36.0)
MCV: 94.9 fL (ref 80.0–100.0)
Monocytes Absolute: 0.7 10*3/uL (ref 0.1–1.0)
Monocytes Relative: 8 %
Neutro Abs: 6.3 10*3/uL (ref 1.7–7.7)
Neutrophils Relative %: 69 %
Platelets: 203 10*3/uL (ref 150–400)
RBC: 5.28 MIL/uL — ABNORMAL HIGH (ref 3.87–5.11)
RDW: 15 % (ref 11.5–15.5)
WBC: 8.9 10*3/uL (ref 4.0–10.5)
nRBC: 0 % (ref 0.0–0.2)

## 2021-04-05 LAB — D-DIMER, QUANTITATIVE: D-Dimer, Quant: 0.38 ug/mL-FEU (ref 0.00–0.50)

## 2021-04-05 LAB — BLOOD GAS, VENOUS
Acid-Base Excess: 11.9 mmol/L — ABNORMAL HIGH (ref 0.0–2.0)
Bicarbonate: 32.6 mmol/L — ABNORMAL HIGH (ref 20.0–28.0)
FIO2: 36
O2 Saturation: 85.2 %
Patient temperature: 37.2
pCO2, Ven: 72.1 mmHg (ref 44.0–60.0)
pH, Ven: 7.341 (ref 7.250–7.430)
pO2, Ven: 55 mmHg — ABNORMAL HIGH (ref 32.0–45.0)

## 2021-04-05 LAB — BRAIN NATRIURETIC PEPTIDE: B Natriuretic Peptide: 146 pg/mL — ABNORMAL HIGH (ref 0.0–100.0)

## 2021-04-05 LAB — RESP PANEL BY RT-PCR (FLU A&B, COVID) ARPGX2
Influenza A by PCR: NEGATIVE
Influenza B by PCR: NEGATIVE
SARS Coronavirus 2 by RT PCR: NEGATIVE

## 2021-04-05 LAB — BASIC METABOLIC PANEL
Anion gap: 8 (ref 5–15)
BUN: 14 mg/dL (ref 8–23)
CO2: 36 mmol/L — ABNORMAL HIGH (ref 22–32)
Calcium: 8.9 mg/dL (ref 8.9–10.3)
Chloride: 93 mmol/L — ABNORMAL LOW (ref 98–111)
Creatinine, Ser: 0.62 mg/dL (ref 0.44–1.00)
GFR, Estimated: >60 mL/min (ref >60)
Glucose, Bld: 109 mg/dL — ABNORMAL HIGH (ref 70–99)
Potassium: 3.6 mmol/L (ref 3.5–5.1)
Sodium: 137 mmol/L (ref 135–145)

## 2021-04-05 MED ORDER — FUROSEMIDE 10 MG/ML IJ SOLN
20.0000 mg | Freq: Once | INTRAMUSCULAR | Status: AC
  Administered 2021-04-06: 20 mg via INTRAVENOUS
  Filled 2021-04-05: qty 2

## 2021-04-05 MED ORDER — IOHEXOL 350 MG/ML SOLN
100.0000 mL | Freq: Once | INTRAVENOUS | Status: AC | PRN
  Administered 2021-04-05: 100 mL via INTRAVENOUS

## 2021-04-05 NOTE — ED Notes (Signed)
Date and time results received: 04/05/21 2220  Test: PCO2 Critical Value: 72.1  Name of Provider Notified: Dr. Kathrynn Humble  Orders Received? Or Actions Taken?: awaiting any orders

## 2021-04-05 NOTE — ED Triage Notes (Signed)
Pt c/o low oxygen levels today at home. Pt states she has chronic shortness of breath since having COVID last year. States doesn't wear oxygen at home.

## 2021-04-05 NOTE — H&P (Addendum)
[place]   PATIENT NAME: Kristina [name]    MR#:  [id number]  DATE OF BIRTH:  07-22-1951  DATE OF ADMISSION:  04/05/2021  PRIMARY CARE PHYSICIAN: Celene Squibb, MD   Patient is coming from: Home  REQUESTING/REFERRING PHYSICIAN: Kem Parkinson, PA-C CHIEF COMPLAINT:   Chief Complaint  Patient presents with   Low Oxygen    HISTORY OF PRESENT ILLNESS:  Kristina [name] is a 70 y.o. female with medical history significant for essential hypertension, COVID-19 in January 2022 with subsequent acute respiratory failure on mechanical ventilation, diabetes mellitus as well as suspected systolic CHF with EF of 40-98% in January 2021 with mild global hypokinesis, who presented to the emergency room with acute onset of dyspnea with associated orthopnea as well as dyspnea on exertion with minimal lower extremity edema and hypoxia.  She denies any paroxysmal  nocturnal dyspnea, wheezing or fever or chills.  No nausea or vomiting or abdominal pain.  No dysuria, oliguria or hematuria or flank pain.  She had 2 COVID vaccines.  ED Course: Upon presentation to the emergency room blood pressure was 184/93 with a heart rate of 103 and pulse continues 84% on room air and pulse oximetry of 96% on 4 L of O2 by nasal cannula.  ABG showed pH 7.34 and PCO2 72.1 and PO2 of 55 with HCO3 of 32.6 and O2 sat of 85.  2% on 2 L of O2 by nasal cannula.  BMP revealed CO2 36 and BNP came back 146.  CBC was unremarkable.  Influenza antigens and COVID-19 PCR came back negative. EKG as reviewed by me : Labs revealed ABG with pH seven-pointEKG showed sinus tachycardia with rate of 102 with poor R wave progression and Q waves inferiorly Imaging: Chest x-ray showed no acute cardiopulmonary disease and low lung volumes with chronic interstitial markings.  Chest CTA revealed no evidence for PE or acute cardiopulmonary disease.  The patient was given 20 mg of IV Lasix.  She will be admitted to a telemetry bed for further  evaluation and management. PAST MEDICAL HISTORY:   Past Medical History:  Diagnosis Date   Hypertension   Systolic CHF. Diabetes mellitus COVID 19 pneumonia with subsequent acute respiratory failure requiring mechanical ventilation  PAST SURGICAL HISTORY:   Past Surgical History:  Procedure Laterality Date   COLONOSCOPY N/A 12/21/2016   Procedure: COLONOSCOPY;  Surgeon: Rogene Houston, MD;  [facility];  Service: Endoscopy;  Laterality: N/A;  930   NO PAST SURGERIES      SOCIAL HISTORY:   Social History   Tobacco Use   Smoking status: Never   Smokeless tobacco: Never  Substance Use Topics   Alcohol use: No    FAMILY HISTORY:   Family History  Problem Relation Age of Onset   Colon cancer Neg Hx     DRUG ALLERGIES:  No Known Allergies  REVIEW OF SYSTEMS:   ROS As per history of present illness. All pertinent systems were reviewed above. Constitutional, HEENT, cardiovascular, respiratory, GI, GU, musculoskeletal, neuro, psychiatric, endocrine, integumentary and hematologic systems were reviewed and are otherwise negative/unremarkable except for positive findings mentioned above in the HPI.   MEDICATIONS AT HOME:   Prior to Admission medications   Medication Sig Start Date End Date Taking? Authorizing Provider  amLODipine (NORVASC) 10 MG tablet Take 10 mg by mouth daily.   Yes [provider]  aspirin EC 81 MG tablet Take 1 tablet (81 mg total) by mouth  daily. 12/28/16  Yes Rehman, Mechele Dawley, MD  carvedilol (COREG) 3.125 MG tablet Take 3.125 mg by mouth 2 (two) times daily with a meal.   Yes [provider]  losartan-hydrochlorothiazide (HYZAAR) 100-25 MG tablet Take 1 tablet by mouth daily.   Yes [provider]  Multiple Vitamin (MULTIVITAMIN) tablet Take 1 tablet by mouth daily.   Yes [provider]  omeprazole (PRILOSEC) 40 MG capsule Take 1 capsule by mouth daily. 07/24/19  Yes [provider]   rosuvastatin (CRESTOR) 10 MG tablet Take 10 mg by mouth daily.   Yes [provider]  albuterol (PROVENTIL) (2.5 MG/3ML) 0.083% nebulizer solution Take 2.5 mg by nebulization every 6 (six) hours. Patient not taking: No sig reported 09/30/19   [provider]  enoxaparin (LOVENOX) 40 MG/0.4ML injection Inject 0.4 mLs (40 mg total) into the skin daily. Patient not taking: Reported on 04/05/2021 10/10/19 11/09/19  Arrien, Jimmy Picket, MD  hydrochlorothiazide (HYDRODIURIL) 25 MG tablet Take 25 mg by mouth daily. Patient not taking: No sig reported    [provider]  ondansetron (ZOFRAN) 4 MG tablet Take 4 mg by mouth every 6 (six) hours as needed for nausea or vomiting.  Patient not taking: No sig reported 09/30/19   [provider]      VITAL SIGNS:  Blood pressure (!) 143/81, pulse 88, temperature 98.9 F (37.2 C), temperature source Oral, resp. rate 20, height 4\' 11"  (1.499 m), weight 86.2 kg, SpO2 92 %.  PHYSICAL EXAMINATION:  Physical Exam  GENERAL:  69 y.o.-year-old Caucasian female patient lying in the bed with no acute distress.  EYES: Pupils equal, round, reactive to light and accommodation. No scleral icterus. Extraocular muscles intact.  HEENT: Head atraumatic, normocephalic. Oropharynx and nasopharynx clear.  NECK:  Supple, no jugular venous distention. No thyroid enlargement, no tenderness.  LUNGS: Slightly diminished bibasilar breath sounds with minimal bibasilar rales. CARDIOVASCULAR: Regular rate and rhythm, S1, S2 normal. No murmurs, rubs, or gallops.  ABDOMEN: Soft, nondistended, nontender. Bowel sounds present. No organomegaly or mass.  EXTREMITIES: 1+ bilateral lower extremity pitting edema with no cyanosis, or clubbing.  NEUROLOGIC: Cranial nerves II through XII are intact. Muscle strength 5/5 in all extremities. Sensation intact. Gait not checked.  PSYCHIATRIC: The patient is alert and oriented x 3.  Normal affect and good eye  contact. SKIN: No obvious rash, lesion, or ulcer.   LABORATORY PANEL:   CBC Recent Labs  Lab 04/05/21 1814  WBC 8.9  HGB 15.0  HCT 50.1*  PLT 203   ------------------------------------------------------------------------------------------------------------------  Chemistries  Recent Labs  Lab 04/05/21 1814  NA 137  K 3.6  CL 93*  CO2 36*  GLUCOSE 109*  BUN 14  CREATININE 0.62  CALCIUM 8.9   ------------------------------------------------------------------------------------------------------------------  Cardiac Enzymes No results for input(s): TROPONINI in the last 168 hours. ------------------------------------------------------------------------------------------------------------------  RADIOLOGY:  CT Angio Chest PE W and/or Wo Contrast  Result Date: 04/05/2021 CLINICAL DATA:  Hypoxia EXAM: CT ANGIOGRAPHY CHEST WITH CONTRAST TECHNIQUE: Multidetector CT imaging of the chest was performed using the standard protocol during bolus administration of intravenous contrast. Multiplanar CT image reconstructions and MIPs were obtained to evaluate the vascular anatomy. CONTRAST:  163mL OMNIPAQUE IOHEXOL 350 MG/ML SOLN COMPARISON:  10/10/2019 FINDINGS: Cardiovascular: Contrast injection is sufficient to demonstrate satisfactory opacification of the pulmonary arteries to the segmental level. There is no pulmonary embolus or evidence of right heart strain. The size of the main pulmonary artery is normal. Heart size is normal, with no  pericardial effusion. The course and caliber of the aorta are normal. There is atherosclerotic calcification. No acute aortic syndrome. Mediastinum/Nodes: No mediastinal, hilar or axillary lymphadenopathy. Normal visualized thyroid. Thoracic esophageal course is normal. Lungs/Pleura: Bibasilar atelectasis. Upper Abdomen: Contrast bolus timing is not optimized for evaluation of the abdominal organs. The visualized portions of the organs of the upper abdomen  are normal. Musculoskeletal: No chest wall abnormality. No bony spinal canal stenosis. Review of the MIP images confirms the above findings. IMPRESSION: No pulmonary embolus or other acute thoracic abnormality. Electronically Signed   By: Ulyses Jarred M.D.   On: 04/05/2021 22:12   DG Chest Port 1 View  Result Date: 04/05/2021 CLINICAL DATA:  Short of breath EXAM: PORTABLE CHEST 1 VIEW COMPARISON:  Radiographs 03/05/2020, CT 10/09/2018 FINDINGS: Low lung volumes. Normal cardiac silhouette. Mild basilar atelectasis. No pulmonary edema. No pneumothorax. There is subtle peripheral interstitial opacities similar to comparison chest radiograph and CT. IMPRESSION: 1. No acute findings. 2. Low lung volumes. 3. Chronic interstitial markings. Electronically Signed   By: Suzy Bouchard M.D.   On: 04/05/2021 19:32      IMPRESSION AND PLAN:  Active Problems:   * No active hospital problems. *  1.  Acute on chronic systolic CHF with subsequent acute hypoxic and hypercarbic respiratory failure.. - The patient will be admitted to a telemetry bed. - We will continue diuresis with IV Lasix. - We will follow serial troponin I's. - Cardiology consult will be obtained as well as 2D echo. - We will consider BiPAP for persistent hypercarbia. - Pulmonary/intensivist consult to be obtained in a.m.  2.  Essential hypertension. - We will continue amlodipine, Hyzaar and Coreg.  3.  Dyslipidemia. - We will continue statin therapy.  4.  GERD. - We will continue PPI therapy.  5.  Type 2 diabetes mellitus. - The patient will be placed on supplement coverage with NovoLog.  DVT prophylaxis: Lovenox.  Code Status: full code.  Family Communication:  The plan of care was discussed in details with the patient (and family). I answered all questions. The patient agreed to proceed with the above mentioned plan. Further management will depend upon hospital course. Disposition Plan: Back to previous home  environment Consults called: Cardiology consult. All the records are reviewed and case discussed with ED provider.  Status is: Inpatient  Remains inpatient appropriate because:Ongoing diagnostic testing needed not appropriate for outpatient work up, Unsafe d/c plan, IV treatments appropriate due to intensity of illness or inability to take PO, and Inpatient level of care appropriate due to severity of illness  Dispo: The patient is from: Home              Anticipated d/c is to: Home              Patient currently is not medically stable to d/c.   Difficult to place patient No  TOTAL TIME TAKING CARE OF THIS PATIENT: 55 minutes.    Christel Mormon M.D on 04/05/2021 at 11:55 PM  [facility]   From 7 PM-7 AM, contact night-coverage [email or web address]  CC: Primary care physician; Celene Squibb, MD

## 2021-04-05 NOTE — ED Notes (Signed)
Pt placed on 4L [place], sats improved to 93%.

## 2021-04-05 NOTE — ED Provider Notes (Signed)
[facility] Provider Note   CSN: [id number] Arrival date & time: 04/05/21  1629     History Chief Complaint  Patient presents with   Low Oxygen    Kristina [name] is a 70 y.o. female.  HPI      Kristina [name] is a 70 y.o. female with past medical history of hypertension, type 2 diabetes, admitted into the hospital January 2021 with acute respiratory failure secondary to COVID-19.  She presents to the Emergency Department from her PCPs office for evaluation of hypoxia and shortness of breath.  Patient reports symptoms x2 days.  She states that she had COVID in January 2021.  She required mechanical ventilation during her hospital course.  She was discharged home after improvement with oxygen and a pulse oximeter.  She states that since she was discharged home she has not required supplemental oxygen.  Yesterday, she noticed some early fatigue and shortness of breath with exertion.  Today she checked her oxygen at home and states that her oxygen level was in the upper 60%.  She was seen at her PCPs office earlier today with O2 sat in 80"s in their office and advised to come to the emergency department.  She also endorses some occasional mild cough.  No hemoptysis, fever or chills.  No abdominal or chest pain.  No increased edema of her lower extremities.  She denies history of cardiac disease or CHF.   Past Medical History:  Diagnosis Date   Hypertension     Patient Active Problem List   Diagnosis Date Noted   Essential hypertension 10/08/2019   Uncontrolled diabetes mellitus ([place]) 10/08/2019   UTI (urinary tract infection) 10/08/2019   Thrush 10/08/2019   Pneumonia due to COVID-19 virus 10/08/2019   Acute respiratory failure with hypoxia and hypercapnia (HCC) 10/02/2019   Acute respiratory distress syndrome (ARDS) due to COVID-19 virus ([place]) 10/02/2019   Special screening for malignant neoplasms, colon 11/06/2016    Past Surgical History:  Procedure  Laterality Date   COLONOSCOPY N/A 12/21/2016   Procedure: COLONOSCOPY;  Surgeon: Rogene Houston, MD;  Location: [facility];  Service: Endoscopy;  Laterality: N/A;  930   NO PAST SURGERIES       OB History   No obstetric history on file.     Family History  Problem Relation Age of Onset   Colon cancer Neg Hx     Social History   Tobacco Use   Smoking status: Never   Smokeless tobacco: Never  Vaping Use   Vaping Use: Never used  Substance Use Topics   Alcohol use: No   Drug use: No    Home Medications Prior to Admission medications   Medication Sig Start Date End Date Taking? Authorizing Provider  albuterol (PROVENTIL) (2.5 MG/3ML) 0.083% nebulizer solution Take 2.5 mg by nebulization every 6 (six) hours. 09/30/19   [provider]  amLODipine (NORVASC) 10 MG tablet Take 10 mg by mouth daily.    [provider]  aspirin EC 81 MG tablet Take 1 tablet (81 mg total) by mouth daily. 12/28/16   Rehman, Mechele Dawley, MD  carvedilol (COREG) 3.125 MG tablet Take 3.125 mg by mouth 2 (two) times daily with a meal.    [provider]  enoxaparin (LOVENOX) 40 MG/0.4ML injection Inject 0.4 mLs (40 mg total) into the skin daily. 10/10/19 11/09/19  Arrien, Jimmy Picket, MD  hydrochlorothiazide (HYDRODIURIL) 25 MG tablet Take 25 mg by mouth daily.  [provider]  losartan-hydrochlorothiazide (HYZAAR) 100-25 MG tablet Take 1 tablet by mouth daily.    [provider]  omeprazole (PRILOSEC) 40 MG capsule Take 1 capsule by mouth daily. 07/24/19   [provider]  ondansetron (ZOFRAN) 4 MG tablet Take 4 mg by mouth every 6 (six) hours as needed for nausea or vomiting.  09/30/19   [provider]  rosuvastatin (CRESTOR) 10 MG tablet Take 10 mg by mouth daily.    [provider]    Allergies    Patient has no known allergies.  Review of Systems   Review of Systems  Constitutional:  Positive for fatigue. Negative for appetite  change, chills and fever.  HENT:  Negative for trouble swallowing.   Respiratory:  Positive for cough and shortness of breath. Negative for wheezing.   Cardiovascular:  Negative for chest pain, palpitations and leg swelling.  Gastrointestinal:  Negative for abdominal pain, nausea and vomiting.  Genitourinary:  Negative for dysuria, flank pain and hematuria.  Musculoskeletal:  Negative for arthralgias, back pain and myalgias.  Skin:  Negative for rash.  Neurological:  Positive for weakness. Negative for dizziness, syncope, speech difficulty, numbness and headaches.  Hematological:  Does not bruise/bleed easily.   Physical Exam Updated Vital Signs BP (!) 184/93 (BP Location: Right Arm)   Pulse (!) 103   Temp 98.9 F (37.2 C) (Oral)   Resp 18   Ht 4\' 11"  (1.499 m)   Wt 86.2 kg   SpO2 (!) 84%   BMI 38.38 kg/m   Physical Exam Vitals and nursing note reviewed.  Constitutional:      Appearance: Normal appearance. She is not ill-appearing or toxic-appearing.  HENT:     Head: Normocephalic.     Mouth/Throat:     Mouth: Mucous membranes are moist.  Eyes:     Extraocular Movements: Extraocular movements intact.     Conjunctiva/sclera: Conjunctivae normal.     Pupils: Pupils are equal, round, and reactive to light.  Neck:     Thyroid: No thyromegaly.     Meningeal: Kernig's sign absent.  Cardiovascular:     Rate and Rhythm: Normal rate and regular rhythm.     Pulses: Normal pulses.  Pulmonary:     Effort: Pulmonary effort is normal.     Breath sounds: Normal breath sounds. No stridor. No wheezing or rhonchi.     Comments: No increased work of breathing on exam.  Patient wearing 4 L O2 by nasal cannula Abdominal:     General: There is no distension.     Palpations: Abdomen is soft.     Tenderness: There is no abdominal tenderness. There is no guarding or rebound.  Musculoskeletal:        General: Normal range of motion.     Cervical back: Normal range of motion.     Comments:  Mild nonpitting edema of the bilateral lower extremities.  No calf tenderness or edema  Skin:    General: Skin is warm.     Capillary Refill: Capillary refill takes less than 2 seconds.     Findings: No erythema or rash.  Neurological:     General: No focal deficit present.     Mental Status: She is alert.     Sensory: No sensory deficit.     Motor: No weakness.    ED Results / Procedures / Treatments   Labs (all labs ordered are listed, but only abnormal results are displayed) Labs Reviewed  BASIC METABOLIC PANEL -  Abnormal; Notable for the following components:      Result Value   Chloride 93 (*)    CO2 36 (*)    Glucose, Bld 109 (*)    All other components within normal limits  BRAIN NATRIURETIC PEPTIDE - Abnormal; Notable for the following components:   B Natriuretic Peptide 146.0 (*)    All other components within normal limits  CBC WITH DIFFERENTIAL/PLATELET - Abnormal; Notable for the following components:   RBC 5.28 (*)    HCT 50.1 (*)    MCHC 29.9 (*)    All other components within normal limits  BLOOD GAS, VENOUS - Abnormal; Notable for the following components:   pCO2, Ven 72.1 (*)    pO2, Ven 55.0 (*)    Bicarbonate 32.6 (*)    Acid-Base Excess 11.9 (*)    All other components within normal limits  RESP PANEL BY RT-PCR (FLU A&B, COVID) ARPGX2  D-DIMER, QUANTITATIVE  URINALYSIS, ROUTINE W REFLEX MICROSCOPIC    EKG EKG Interpretation  Date/Time: 04/05/21   Ventricular Rate: 102  PR Interval: 155  QRS Duration:  98 QT Interval: 353  QTC Calculation: 460  R Axis:    Text Interpretation:  sinue tachycardia, borderline low voltage   EKG reviewed by Dr. Marcina Millard     Radiology CT Angio Chest PE W and/or Wo Contrast  Result Date: 04/05/2021 CLINICAL DATA:  Hypoxia EXAM: CT ANGIOGRAPHY CHEST WITH CONTRAST TECHNIQUE: Multidetector CT imaging of the chest was performed using the standard protocol during bolus administration of intravenous contrast.  Multiplanar CT image reconstructions and MIPs were obtained to evaluate the vascular anatomy. CONTRAST:  146mL OMNIPAQUE IOHEXOL 350 MG/ML SOLN COMPARISON:  10/10/2019 FINDINGS: Cardiovascular: Contrast injection is sufficient to demonstrate satisfactory opacification of the pulmonary arteries to the segmental level. There is no pulmonary embolus or evidence of right heart strain. The size of the main pulmonary artery is normal. Heart size is normal, with no pericardial effusion. The course and caliber of the aorta are normal. There is atherosclerotic calcification. No acute aortic syndrome. Mediastinum/Nodes: No mediastinal, hilar or axillary lymphadenopathy. Normal visualized thyroid. Thoracic esophageal course is normal. Lungs/Pleura: Bibasilar atelectasis. Upper Abdomen: Contrast bolus timing is not optimized for evaluation of the abdominal organs. The visualized portions of the organs of the upper abdomen are normal. Musculoskeletal: No chest wall abnormality. No bony spinal canal stenosis. Review of the MIP images confirms the above findings. IMPRESSION: No pulmonary embolus or other acute thoracic abnormality. Electronically Signed   By: Ulyses Jarred M.D.   On: 04/05/2021 22:12   DG Chest Port 1 View  Result Date: 04/05/2021 CLINICAL DATA:  Short of breath EXAM: PORTABLE CHEST 1 VIEW COMPARISON:  Radiographs 03/05/2020, CT 10/09/2018 FINDINGS: Low lung volumes. Normal cardiac silhouette. Mild basilar atelectasis. No pulmonary edema. No pneumothorax. There is subtle peripheral interstitial opacities similar to comparison chest radiograph and CT. IMPRESSION: 1. No acute findings. 2. Low lung volumes. 3. Chronic interstitial markings. Electronically Signed   By: Suzy Bouchard M.D.   On: 04/05/2021 19:32    Procedures Procedures   Medications Ordered in ED Medications - No data to display  ED Course  I have reviewed the triage vital signs and the nursing notes.  Pertinent labs & imaging results  that were available during my care of the patient were reviewed by me and considered in my medical decision making (see chart for details).    MDM Rules/Calculators/A&P  On arrival, patient's O2 saturation was 84% on room air.  She was placed on 4 L oxygen by nasal cannula and on my exam her O2 sat is in the low to mid 90s.  lungs are clear to auscultation. Hx of Covid in Jan 2021.  D/c home with oxygen, but pt did not have oxygen requirement after leaving the hospital.  No recent illness fever or chills.  No history of COPD, asthma, or CHF.  On exam, patient mildly labored with speech.  She is well-appearing and nontoxic.  Afebrile.  No known recent COVID exposures.  She denies any recent chemical exposures.  Will obtain labs and chest x-ray, EKG.    Attempted to decrease patient's oxygen to 2 L, but sats dropped to upper 80s, so return to 4 L.  D-dimer reassuring.  No leukocytosis.  Electrolytes without significant findings.  BNP 146.  COVID and influenza testing negative.  Chest x-ray without acute findings.  Source of patient's dyspnea and hypoxia unclear at this time.  We will proceed with CT angio of chest.  CT angio without evidence for PE.  Venous blood gas shows bicarb of 32.6.  We will consult hospitalist for admission.  Discussed findings with [facility] hospitalist, Dr. Sidney Ace who agrees to admit.  Recommend patient be placed on BiPAP and Lasix has been ordered.    Final Clinical Impression(s) / ED Diagnoses Final diagnoses:  Hypoxia    Rx / DC Orders ED Discharge Orders     None        Kem Parkinson, PA-C 04/06/21 0007    Varney Biles, MD 04/06/21 [phone]

## 2021-04-06 ENCOUNTER — Inpatient Hospital Stay (HOSPITAL_COMMUNITY): Payer: Medicare HMO

## 2021-04-06 ENCOUNTER — Encounter (HOSPITAL_COMMUNITY): Payer: Self-pay | Admitting: Family Medicine

## 2021-04-06 DIAGNOSIS — I5021 Acute systolic (congestive) heart failure: Secondary | ICD-10-CM

## 2021-04-06 DIAGNOSIS — E1169 Type 2 diabetes mellitus with other specified complication: Secondary | ICD-10-CM

## 2021-04-06 DIAGNOSIS — E669 Obesity, unspecified: Secondary | ICD-10-CM

## 2021-04-06 DIAGNOSIS — R0600 Dyspnea, unspecified: Secondary | ICD-10-CM

## 2021-04-06 LAB — BASIC METABOLIC PANEL
Anion gap: 9 (ref 5–15)
BUN: 11 mg/dL (ref 8–23)
CO2: 36 mmol/L — ABNORMAL HIGH (ref 22–32)
Calcium: 8.5 mg/dL — ABNORMAL LOW (ref 8.9–10.3)
Chloride: 93 mmol/L — ABNORMAL LOW (ref 98–111)
Creatinine, Ser: 0.6 mg/dL (ref 0.44–1.00)
GFR, Estimated: >60 mL/min (ref >60)
Glucose, Bld: 116 mg/dL — ABNORMAL HIGH (ref 70–99)
Potassium: 3.2 mmol/L — ABNORMAL LOW (ref 3.5–5.1)
Sodium: 138 mmol/L (ref 135–145)

## 2021-04-06 LAB — GLUCOSE, CAPILLARY
Glucose-Capillary: 113 mg/dL — ABNORMAL HIGH (ref 70–99)
Glucose-Capillary: 94 mg/dL (ref 70–99)
Glucose-Capillary: 110 mg/dL — ABNORMAL HIGH (ref 70–99)
Glucose-Capillary: 148 mg/dL — ABNORMAL HIGH (ref 70–99)

## 2021-04-06 LAB — URINALYSIS, ROUTINE W REFLEX MICROSCOPIC
Bacteria, UA: NONE SEEN
Bilirubin Urine: NEGATIVE
Glucose, UA: NEGATIVE mg/dL
Hgb urine dipstick: NEGATIVE
Ketones, ur: NEGATIVE mg/dL
Nitrite: NEGATIVE
Protein, ur: NEGATIVE mg/dL
Specific Gravity, Urine: >1.046 — ABNORMAL HIGH (ref 1.005–1.030)
pH: 7 (ref 5.0–8.0)

## 2021-04-06 LAB — CBC
HCT: 49.8 % — ABNORMAL HIGH (ref 36.0–46.0)
Hemoglobin: 14.6 g/dL (ref 12.0–15.0)
MCH: 27.9 pg (ref 26.0–34.0)
MCHC: 29.3 g/dL — ABNORMAL LOW (ref 30.0–36.0)
MCV: 95 fL (ref 80.0–100.0)
Platelets: 199 10*3/uL (ref 150–400)
RBC: 5.24 MIL/uL — ABNORMAL HIGH (ref 3.87–5.11)
RDW: 15.3 % (ref 11.5–15.5)
WBC: 8.1 10*3/uL (ref 4.0–10.5)
nRBC: 0 % (ref 0.0–0.2)

## 2021-04-06 LAB — HEMOGLOBIN A1C
Hgb A1c MFr Bld: 6.3 % — ABNORMAL HIGH (ref 4.8–5.6)
Mean Plasma Glucose: 134.11 mg/dL

## 2021-04-06 LAB — HIV ANTIBODY (ROUTINE TESTING W REFLEX): HIV Screen 4th Generation wRfx: NONREACTIVE

## 2021-04-06 LAB — BLOOD GAS, ARTERIAL
Acid-Base Excess: 14.5 mmol/L — ABNORMAL HIGH (ref 0.0–2.0)
Bicarbonate: 35 mmol/L — ABNORMAL HIGH (ref 20.0–28.0)
Drawn by: 21310
FIO2: 50
O2 Saturation: 92.7 %
Patient temperature: 36.7
pCO2 arterial: 76.6 mmHg (ref 32.0–48.0)
pH, Arterial: 7.345 — ABNORMAL LOW (ref 7.350–7.450)
pO2, Arterial: 69.9 mmHg — ABNORMAL LOW (ref 83.0–108.0)

## 2021-04-06 LAB — ECHOCARDIOGRAM COMPLETE
AR max vel: 2.39 cm2
AV Area VTI: 2.31 cm2
AV Area mean vel: 2.38 cm2
AV Mean grad: 6 mmHg
AV Peak grad: 10.5 mmHg
Ao pk vel: 1.62 m/s
Area-P 1/2: 3.31 cm2
Height: 59 in
MV VTI: 3.9 cm2
S' Lateral: 2.81 cm
Weight: 3033.53 oz

## 2021-04-06 LAB — MRSA NEXT GEN BY PCR, NASAL: MRSA by PCR Next Gen: NOT DETECTED

## 2021-04-06 MED ORDER — INSULIN ASPART 100 UNIT/ML IJ SOLN
0.0000 [IU] | Freq: Three times a day (TID) | INTRAMUSCULAR | Status: DC
  Administered 2021-04-06: 2 [IU] via SUBCUTANEOUS
  Administered 2021-04-07 (×2): 3 [IU] via SUBCUTANEOUS
  Administered 2021-04-07 – 2021-04-08 (×2): 2 [IU] via SUBCUTANEOUS
  Administered 2021-04-08: 3 [IU] via SUBCUTANEOUS

## 2021-04-06 MED ORDER — ONDANSETRON HCL 4 MG/2ML IJ SOLN: 4.0000 mg | Freq: Four times a day (QID) | INTRAMUSCULAR | Status: DC | PRN

## 2021-04-06 MED ORDER — POTASSIUM CHLORIDE CRYS ER 20 MEQ PO TBCR
40.0000 meq | EXTENDED_RELEASE_TABLET | Freq: Four times a day (QID) | ORAL | Status: AC
  Administered 2021-04-06 (×2): 40 meq via ORAL
  Filled 2021-04-06 (×2): qty 2

## 2021-04-06 MED ORDER — ASPIRIN EC 81 MG PO TBEC
81.0000 mg | DELAYED_RELEASE_TABLET | Freq: Every day | ORAL | Status: DC
  Administered 2021-04-06 – 2021-04-09 (×4): 81 mg via ORAL
  Filled 2021-04-06 (×4): qty 1

## 2021-04-06 MED ORDER — ACETAMINOPHEN 650 MG RE SUPP
650.0000 mg | Freq: Four times a day (QID) | RECTAL | Status: DC | PRN
Start: 2021-04-06 — End: 2021-04-09

## 2021-04-06 MED ORDER — ACETAMINOPHEN 325 MG PO TABS: 650.0000 mg | ORAL_TABLET | Freq: Four times a day (QID) | ORAL | Status: DC | PRN

## 2021-04-06 MED ORDER — MAGNESIUM HYDROXIDE 400 MG/5ML PO SUSP: 30.0000 mL | Freq: Every day | ORAL | Status: DC | PRN

## 2021-04-06 MED ORDER — ROSUVASTATIN CALCIUM 10 MG PO TABS
10.0000 mg | ORAL_TABLET | Freq: Every day | ORAL | Status: DC
  Administered 2021-04-06 – 2021-04-09 (×4): 10 mg via ORAL
  Filled 2021-04-06 (×4): qty 1

## 2021-04-06 MED ORDER — CARVEDILOL 3.125 MG PO TABS
3.1250 mg | ORAL_TABLET | Freq: Two times a day (BID) | ORAL | Status: DC
Start: 2021-04-06 — End: 2021-04-09
  Administered 2021-04-06 – 2021-04-09 (×7): 3.125 mg via ORAL
  Filled 2021-04-06 (×7): qty 1

## 2021-04-06 MED ORDER — TRAZODONE HCL 50 MG PO TABS: 25.0000 mg | ORAL_TABLET | Freq: Every evening | ORAL | Status: DC | PRN

## 2021-04-06 MED ORDER — AMLODIPINE BESYLATE 5 MG PO TABS
10.0000 mg | ORAL_TABLET | Freq: Every day | ORAL | Status: DC
  Administered 2021-04-06 – 2021-04-09 (×4): 10 mg via ORAL
  Filled 2021-04-06 (×4): qty 2

## 2021-04-06 MED ORDER — CHLORHEXIDINE GLUCONATE CLOTH 2 % EX PADS
6.0000 | MEDICATED_PAD | Freq: Every day | CUTANEOUS | Status: DC
  Administered 2021-04-06 – 2021-04-08 (×3): 6 via TOPICAL

## 2021-04-06 MED ORDER — LOSARTAN POTASSIUM 50 MG PO TABS
100.0000 mg | ORAL_TABLET | Freq: Every day | ORAL | Status: DC
  Administered 2021-04-06 – 2021-04-09 (×4): 100 mg via ORAL
  Filled 2021-04-06 (×4): qty 2

## 2021-04-06 MED ORDER — FUROSEMIDE 10 MG/ML IJ SOLN
60.0000 mg | Freq: Once | INTRAMUSCULAR | Status: AC
  Administered 2021-04-06: 60 mg via INTRAVENOUS
  Filled 2021-04-06: qty 6

## 2021-04-06 MED ORDER — ONDANSETRON HCL 4 MG PO TABS: 4.0000 mg | ORAL_TABLET | Freq: Four times a day (QID) | ORAL | Status: DC | PRN

## 2021-04-06 MED ORDER — LOSARTAN POTASSIUM-HCTZ 100-25 MG PO TABS: 1.0000 | ORAL_TABLET | Freq: Every day | ORAL | Status: DC

## 2021-04-06 MED ORDER — ADULT MULTIVITAMIN W/MINERALS CH
1.0000 | ORAL_TABLET | Freq: Every day | ORAL | Status: DC
  Administered 2021-04-06 – 2021-04-09 (×4): 1 via ORAL
  Filled 2021-04-06 (×4): qty 1

## 2021-04-06 MED ORDER — HYDROCHLOROTHIAZIDE 25 MG PO TABS
25.0000 mg | ORAL_TABLET | Freq: Every day | ORAL | Status: DC
  Administered 2021-04-06: 25 mg via ORAL
  Filled 2021-04-06: qty 1

## 2021-04-06 MED ORDER — ENOXAPARIN SODIUM 40 MG/0.4ML IJ SOSY
40.0000 mg | PREFILLED_SYRINGE | INTRAMUSCULAR | Status: DC
  Administered 2021-04-06 – 2021-04-09 (×4): 40 mg via SUBCUTANEOUS
  Filled 2021-04-06 (×5): qty 0.4

## 2021-04-06 MED ORDER — FUROSEMIDE 10 MG/ML IJ SOLN
40.0000 mg | Freq: Once | INTRAMUSCULAR | Status: AC
  Administered 2021-04-06: 40 mg via INTRAVENOUS
  Filled 2021-04-06: qty 4

## 2021-04-06 MED ORDER — FUROSEMIDE 10 MG/ML IJ SOLN
20.0000 mg | Freq: Two times a day (BID) | INTRAMUSCULAR | Status: DC
  Administered 2021-04-06: 20 mg via INTRAVENOUS
  Filled 2021-04-06: qty 2

## 2021-04-06 MED ORDER — PANTOPRAZOLE SODIUM 40 MG PO TBEC
40.0000 mg | DELAYED_RELEASE_TABLET | Freq: Every day | ORAL | Status: DC
  Administered 2021-04-06 – 2021-04-09 (×4): 40 mg via ORAL
  Filled 2021-04-06 (×4): qty 1

## 2021-04-06 MED ORDER — LIVING BETTER WITH HEART FAILURE BOOK
Freq: Once | Status: AC
  Administered 2021-04-06: 1

## 2021-04-06 NOTE — Progress Notes (Signed)
PROGRESS NOTE    Kristina [name]  MKL:[id number] DOB: April 28, 1951 DOA: 04/05/2021 PCP: Celene Squibb, MD   Chief Complaint  Patient presents with   Low Oxygen    Brief admission narrative:  Kristina [name] is a 70 y.o. female with medical history significant for essential hypertension, COVID-19 in January 2022 with subsequent acute respiratory failure on mechanical ventilation, diabetes mellitus as well as suspected systolic CHF with EF of 69-79% in January 2021 with mild global hypokinesis, who presented to the emergency room with acute onset of dyspnea with associated orthopnea as well as dyspnea on exertion with minimal lower extremity edema and hypoxia.  She denies any paroxysmal  nocturnal dyspnea, wheezing or fever or chills.  No nausea or vomiting or abdominal pain.  No dysuria, oliguria or hematuria or flank pain.  She had 2 COVID vaccines.  Patient work-up demonstrated hypoxia with hypercapnia on ABG; mildly elevated BMP (no accurate given obesity) and a CT angiogram revealing no evidence of pulmonary embolism or acute cardiopulmonary process outside vascular congestion.  Assessment & Plan: 1-acute respiratory failure with hypoxia in the setting of acute on chronic heart failure -Per last echo concerns of systolic component -Patient denies chest pain -Negative troponin -Follow echo -Continue IV diuresis -Low-sodium diet, daily weights and strict I's and O's. -Will follow cardiology service recommendations -Closely follow electrolytes and renal function while actively diuresing patient. -Wean of oxygen supplementation as tolerated.  2-essential hypertension -Appears to be stable at this time -Continue to follow vital signs.  3-morbid class II obesity -Low calorie diet, portion control and increase physical activity discussed with patient. -Body mass index is 38.29 kg/m.  4-gastroesophageal reflux disease -Continue PPI.  5-hyperlipidemia -Continue  statin.  6-hypercapnia -Continue to use BiPAP nightly -Repeat ABG in a.m. -Patient will need a sleep study as an outpatient. -Weight loss encouraged.  7-type 2 diabetes mellitus -Continue sliding scale insulin -Modify carbohydrate diet discussed with patient.   DVT prophylaxis: Lovenox Code Status: Full code Family Communication: Husband at bedside. Disposition:   Status is: Inpatient  Remains inpatient appropriate because:IV treatments appropriate due to intensity of illness or inability to take PO  Dispo: The patient is from: Home              Anticipated d/c is to: Home              Patient currently is not medically stable to d/c.   Difficult to place patient No       Consultants:  Cardiology service  Procedures:  See below for x-ray report 2D echo: Pending  Antimicrobials:  None   Subjective: Reporting good urine output; improvement in her breathing expressed.  Still difficulty speaking in full sentences, requiring 5 L nasal cannula supplementation and having shortness of breath with activity.  Patient expressed positive orthopnea.  Objective: Vitals:   04/06/21 1300 04/06/21 1400 04/06/21 1432 04/06/21 1752  BP: 134/75 (!) 156/66 (!) 156/66 (!) 134/95  Pulse: 91 94 100 99  Resp: 20 17 17    Temp:      TempSrc:      SpO2: 92% 92% 93%   Weight:      Height:        Intake/Output Summary (Last 24 hours) at 04/06/2021 1809 Last data filed at 04/06/2021 1400 Gross per 24 hour  Intake --  Output 1470 ml  Net -1470 ml   Filed Weights   04/05/21 1656 04/06/21 0353  Weight: 86.2 kg 86 kg  Examination:  General exam: Appears in no major distress currently; still with mild difficulty speaking in full sentences and complaining of orthopnea.  She feels overall better than when she presented to the hospital.  Using 5 L nasal cannula supplementation at this time. Respiratory system: Decreased breath sounds at the bases, positive crackles on auscultation.   No using accessory muscles. 5.  The second on supplementation in place to keep O2 sat above 90%. Cardiovascular system: Distant heart sounds; unable to assess JVD with body habitus.  No rubs, no gallops. Gastrointestinal system: Abdomen is obese, nondistended, soft and nontender. No organomegaly or masses felt. Normal bowel sounds heard. Central nervous system: Alert and oriented. No focal neurological deficits. Extremities: 1+ edema bilaterally.  No cyanosis or clubbing Skin: No petechiae. Psychiatry: Judgement and insight appear normal. Mood & affect appropriate.     Data Reviewed: I have personally reviewed following labs and imaging studies  CBC: Recent Labs  Lab 04/05/21 1814 04/06/21 0340  WBC 8.9 8.1  NEUTROABS 6.3  --   HGB 15.0 14.6  HCT 50.1* 49.8*  MCV 94.9 95.0  PLT 203 408    Basic Metabolic Panel: Recent Labs  Lab 04/05/21 1814 04/06/21 0340  NA 137 138  K 3.6 3.2*  CL 93* 93*  CO2 36* 36*  GLUCOSE 109* 116*  BUN 14 11  CREATININE 0.62 0.60  CALCIUM 8.9 8.5*    GFR: Estimated Creatinine Clearance: 63.2 mL/min (by C-G formula based on SCr of 0.6 mg/dL).   CBG: Recent Labs  Lab 04/06/21 0806 04/06/21 1214 04/06/21 1702  GLUCAP 113* 94 110*     Recent Results (from the past 240 hour(s))  Resp Panel by RT-PCR (Flu A&B, Covid) Nasopharyngeal Swab     Status: None   Collection Time: 04/05/21  7:26 PM   Specimen: Nasopharyngeal Swab; Nasopharyngeal(NP) swabs in vial transport medium  Result Value Ref Range Status   SARS Coronavirus 2 by RT PCR NEGATIVE NEGATIVE Final    Comment: (NOTE) SARS-CoV-2 target nucleic acids are NOT DETECTED.  The SARS-CoV-2 RNA is generally detectable in upper respiratory specimens during the acute phase of infection. The lowest concentration of SARS-CoV-2 viral copies this assay can detect is 138 copies/mL. A negative result does not preclude SARS-Cov-2 infection and should not be used as the sole basis for  treatment or other patient management decisions. A negative result may occur with  improper specimen collection/handling, submission of specimen other than nasopharyngeal swab, presence of viral mutation(s) within the areas targeted by this assay, and inadequate number of viral copies(<138 copies/mL). A negative result must be combined with clinical observations, patient history, and epidemiological information. The expected result is Negative.  Fact Sheet for Patients:  EntrepreneurPulse.com.au  Fact Sheet for Healthcare Providers:  IncredibleEmployment.be  This test is no t yet approved or cleared by the [place] FDA and  has been authorized for detection and/or diagnosis of SARS-CoV-2 by FDA under an Emergency Use Authorization (EUA). This EUA will remain  in effect (meaning this test can be used) for the duration of the COVID-19 declaration under Section 564(b)(1) of the Act, 21 U.S.C.section 360bbb-3(b)(1), unless the authorization is terminated  or revoked sooner.       Influenza A by PCR NEGATIVE NEGATIVE Final   Influenza B by PCR NEGATIVE NEGATIVE Final    Comment: (NOTE) The Xpert Xpress SARS-CoV-2/FLU/RSV plus assay is intended as an aid in the diagnosis of influenza from Nasopharyngeal swab specimens and should not be used  as a sole basis for treatment. Nasal washings and aspirates are unacceptable for Xpert Xpress SARS-CoV-2/FLU/RSV testing.  Fact Sheet for Patients: EntrepreneurPulse.com.au  Fact Sheet for Healthcare Providers: IncredibleEmployment.be  This test is not yet approved or cleared by the [place] FDA and has been authorized for detection and/or diagnosis of SARS-CoV-2 by FDA under an Emergency Use Authorization (EUA). This EUA will remain in effect (meaning this test can be used) for the duration of the COVID-19 declaration under Section 564(b)(1) of the Act, 21  U.S.C. section 360bbb-3(b)(1), unless the authorization is terminated or revoked.  Performed at [facility], [address] [place], [place] [address]   MRSA Next Gen by PCR, Nasal     Status: None   Collection Time: 04/06/21  3:07 AM   Specimen: Nasal Mucosa; Nasal Swab  Result Value Ref Range Status   MRSA by PCR Next Gen NOT DETECTED NOT DETECTED Final    Comment: (NOTE) The GeneXpert MRSA Assay (FDA approved for NASAL specimens only), is one component of a comprehensive MRSA colonization surveillance program. It is not intended to diagnose MRSA infection nor to guide or monitor treatment for MRSA infections. Test performance is not FDA approved in patients less than 10 years old. Performed at [facility], [address] [place], [place] [address]      Radiology Studies: CT Angio Chest PE W and/or Wo Contrast  Result Date: 04/05/2021 CLINICAL DATA:  Hypoxia EXAM: CT ANGIOGRAPHY CHEST WITH CONTRAST TECHNIQUE: Multidetector CT imaging of the chest was performed using the standard protocol during bolus administration of intravenous contrast. Multiplanar CT image reconstructions and MIPs were obtained to evaluate the vascular anatomy. CONTRAST:  181mL OMNIPAQUE IOHEXOL 350 MG/ML SOLN COMPARISON:  10/10/2019 FINDINGS: Cardiovascular: Contrast injection is sufficient to demonstrate satisfactory opacification of the pulmonary arteries to the segmental level. There is no pulmonary embolus or evidence of right heart strain. The size of the main pulmonary artery is normal. Heart size is normal, with no pericardial effusion. The course and caliber of the aorta are normal. There is atherosclerotic calcification. No acute aortic syndrome. Mediastinum/Nodes: No mediastinal, hilar or axillary lymphadenopathy. Normal visualized thyroid. Thoracic esophageal course is normal. Lungs/Pleura: Bibasilar atelectasis. Upper Abdomen: Contrast bolus timing is not optimized for evaluation of the abdominal organs.  The visualized portions of the organs of the upper abdomen are normal. Musculoskeletal: No chest wall abnormality. No bony spinal canal stenosis. Review of the MIP images confirms the above findings. IMPRESSION: No pulmonary embolus or other acute thoracic abnormality. Electronically Signed   By: Ulyses Jarred M.D.   On: 04/05/2021 22:12   DG Chest Port 1 View  Result Date: 04/05/2021 CLINICAL DATA:  Short of breath EXAM: PORTABLE CHEST 1 VIEW COMPARISON:  Radiographs 03/05/2020, CT 10/09/2018 FINDINGS: Low lung volumes. Normal cardiac silhouette. Mild basilar atelectasis. No pulmonary edema. No pneumothorax. There is subtle peripheral interstitial opacities similar to comparison chest radiograph and CT. IMPRESSION: 1. No acute findings. 2. Low lung volumes. 3. Chronic interstitial markings. Electronically Signed   By: Suzy Bouchard M.D.   On: 04/05/2021 19:32   ECHOCARDIOGRAM COMPLETE  Result Date: 04/06/2021    ECHOCARDIOGRAM REPORT   Patient Name:   Kristina [name] Date of Exam: 04/06/2021 Medical Rec #:  [id number]       Height:       59.0 in Accession #:    [id number]      Weight:       189.6 lb Date of Birth:  01-15-1951  BSA:          1.803 m Patient Age:    49 years        BP:           155/77 mmHg Patient Gender: F               HR:           98 bpm. Exam Location:  [facility] Procedure: 2D Echo, Cardiac Doppler and Color Doppler Indications:    CHF-Acute Systolic  History:        Patient has prior history of Echocardiogram examinations, most                 recent 10/03/2019. CHF; Risk Factors:Hypertension. ARDS-COVID.  Sonographer:    Wenda Low Referring Phys: [id number] JAN A [place]amanca  1. Left ventricular ejection fraction, by estimation, is 60 to 65%. The left ventricle has normal function. The left ventricle has no regional wall motion abnormalities. Left ventricular diastolic parameters are consistent with Grade I diastolic dysfunction (impaired relaxation).  2. Right  ventricular systolic function is normal. The right ventricular size is normal. Tricuspid regurgitation signal is inadequate for assessing PA pressure.  3. The mitral valve is normal in structure. Mild mitral valve regurgitation. No evidence of mitral stenosis.  4. The aortic valve is tricuspid. There is mild calcification of the aortic valve. There is mild thickening of the aortic valve. Aortic valve regurgitation is not visualized. No aortic stenosis is present.  5. The inferior vena cava is normal in size with greater than 50% respiratory variability, suggesting right atrial pressure of 3 mmHg. FINDINGS  Left Ventricle: Left ventricular ejection fraction, by estimation, is 60 to 65%. The left ventricle has normal function. The left ventricle has no regional wall motion abnormalities. The left ventricular internal cavity size was normal in size. There is  no left ventricular hypertrophy. Left ventricular diastolic parameters are consistent with Grade I diastolic dysfunction (impaired relaxation). Normal left ventricular filling pressure. Right Ventricle: The right ventricular size is normal. No increase in right ventricular wall thickness. Right ventricular systolic function is normal. Tricuspid regurgitation signal is inadequate for assessing PA pressure. Left Atrium: Left atrial size was normal in size. Right Atrium: Right atrial size was normal in size. Pericardium: There is no evidence of pericardial effusion. Mitral Valve: The mitral valve is normal in structure. Mild mitral valve regurgitation. No evidence of mitral valve stenosis. MV peak gradient, 3.1 mmHg. The mean mitral valve gradient is 1.0 mmHg. Tricuspid Valve: The tricuspid valve is normal in structure. Tricuspid valve regurgitation is not demonstrated. No evidence of tricuspid stenosis. Aortic Valve: The aortic valve is tricuspid. There is mild calcification of the aortic valve. There is mild thickening of the aortic valve. There is mild aortic  valve annular calcification. Aortic valve regurgitation is not visualized. No aortic stenosis  is present. Aortic valve mean gradient measures 6.0 mmHg. Aortic valve peak gradient measures 10.5 mmHg. Aortic valve area, by VTI measures 2.31 cm. Pulmonic Valve: The pulmonic valve was not well visualized. Pulmonic valve regurgitation is not visualized. No evidence of pulmonic stenosis. Aorta: The aortic root is normal in size and structure. Venous: The inferior vena cava is normal in size with greater than 50% respiratory variability, suggesting right atrial pressure of 3 mmHg. IAS/Shunts: No atrial level shunt detected by color flow Doppler.  LEFT VENTRICLE PLAX 2D LVIDd:         4.37 cm  Diastology LVIDs:  2.81 cm  LV e' medial:    5.96 cm/s LV PW:         0.92 cm  LV E/e' medial:  10.2 LV IVS:        1.08 cm  LV e' lateral:   6.31 cm/s LVOT diam:     2.10 cm  LV E/e' lateral: 9.6 LV SV:         73 LV SV Index:   41 LVOT Area:     3.46 cm  RIGHT VENTRICLE RV Basal diam:  2.87 cm RV Mid diam:    2.41 cm RV S prime:     14.10 cm/s TAPSE (M-mode): 1.9 cm LEFT ATRIUM             Index       RIGHT ATRIUM           Index LA diam:        3.70 cm 2.05 cm/m  RA Area:     11.90 cm LA Vol (A2C):   48.2 ml 26.73 ml/m RA Volume:   26.00 ml  14.42 ml/m LA Vol (A4C):   35.7 ml 19.80 ml/m LA Biplane Vol: 41.5 ml 23.02 ml/m  AORTIC VALVE AV Area (Vmax):    2.39 cm AV Area (Vmean):   2.38 cm AV Area (VTI):     2.31 cm AV Vmax:           162.00 cm/s AV Vmean:          108.000 cm/s AV VTI:            0.317 m AV Peak Grad:      10.5 mmHg AV Mean Grad:      6.0 mmHg LVOT Vmax:         112.00 cm/s LVOT Vmean:        74.300 cm/s LVOT VTI:          0.211 m LVOT/AV VTI ratio: 0.67  AORTA Ao Root diam: 3.00 cm Ao Asc diam:  3.20 cm MITRAL VALVE MV Area (PHT): 3.31 cm    SHUNTS MV Area VTI:   3.90 cm    Systemic VTI:  0.21 m MV Peak grad:  3.1 mmHg    Systemic Diam: 2.10 cm MV Mean grad:  1.0 mmHg MV Vmax:       0.88 m/s MV  Vmean:      50.3 cm/s MV Decel Time: 229 msec MV E velocity: 60.85 cm/s MV A velocity: 89.10 cm/s MV E/A ratio:  0.68 Carlyle Dolly MD Electronically signed by Carlyle Dolly MD Signature Date/Time: 04/06/2021/1:46:20 PM    Final      Scheduled Meds:  amLODipine  10 mg Oral Daily   aspirin EC  81 mg Oral Daily   carvedilol  3.125 mg Oral BID WC   Chlorhexidine Gluconate Cloth  6 each Topical Daily   enoxaparin (LOVENOX) injection  40 mg Subcutaneous Q24H   insulin aspart  0-15 Units Subcutaneous TID AC & HS   losartan  100 mg Oral Daily   multivitamin with minerals  1 tablet Oral Daily   pantoprazole  40 mg Oral Daily   rosuvastatin  10 mg Oral Daily   Continuous Infusions:   LOS: 1 day    Time spent: 35 minutes  Barton Dubois, MD [facility]   To contact the attending provider between 7A-7P or the covering provider during after hours 7P-7A, please log into the web site [email or web address] and access using universal [place]  password for that web site. If you do not have the password, please call the hospital operator.  04/06/2021, 6:09 PM

## 2021-04-06 NOTE — Progress Notes (Signed)
RT removed patient from BIPAP and placed on 3L O2 for SATs 93%

## 2021-04-06 NOTE — Plan of Care (Signed)
  Problem: Education: Goal: Ability to demonstrate management of disease process will improve Outcome: Progressing Goal: Ability to verbalize understanding of medication therapies will improve Outcome: Progressing Goal: Individualized Educational Video(s) Outcome: Progressing   Problem: Activity: Goal: Capacity to carry out activities will improve Outcome: Progressing   Problem: Cardiac: Goal: Ability to achieve and maintain adequate cardiopulmonary perfusion will improve Outcome: Progressing   Problem: Education: Goal: Knowledge of General Education information will improve Description: Including pain rating scale, medication(s)/side effects and non-pharmacologic comfort measures Outcome: Progressing   Problem: Health Behavior/Discharge Planning: Goal: Ability to manage health-related needs will improve Outcome: Progressing

## 2021-04-06 NOTE — Progress Notes (Signed)
  Echocardiogram 2D Echocardiogram has been performed.  Kristina [name] 04/06/2021, 11:56 AM

## 2021-04-06 NOTE — Consult Note (Signed)
Cardiology Consultation:   Patient ID: Kristina [name] MRN: [id number]; DOB: October 16, 1950  Admit date: 04/05/2021 Date of Consult: 04/06/2021  PCP:  Celene Squibb, MD   [facility] Providers Cardiologist:  None   {    Patient Profile:   Kristina [name] is a 70 y.o. female with a hx of HTN, DM2, who is being seen 04/06/2021 for the evaluation of SOB at the request of Dr Dyann Kief  History of Present Illness:   Kristina [name] 70 yo female history of HTN, COVID in Jan 2022 was vent at the time, DM2, mild LV systolic dysfunction with H&P reporting an LVEF of 45-50% in Jan 2012 ( I do not see study in epic), presented with DOE, LE edema.  Sats 84% on presentation, was transiently on bipap.   Reports SOB started yesterday. Denies any cough or wheezing, no fevers or chills, no chest pains. Mild swelling in her legs at times.   K 3.6 BUN 14 Cr 0.62 BNP 146 WBC 8.9 Hgb 15 Plt 203 Ddimer 0.38  COVID neg ABG 7.35/76/70/35 CXR no acute findings CT PE: no PE, no acute process Past Medical History:  Diagnosis Date   Hypertension     Past Surgical History:  Procedure Laterality Date   COLONOSCOPY N/A 12/21/2016   Procedure: COLONOSCOPY;  Surgeon: Rogene Houston, MD;  Location: [facility];  Service: Endoscopy;  Laterality: N/A;  930   NO PAST SURGERIES        Inpatient Medications: Scheduled Meds:  amLODipine  10 mg Oral Daily   aspirin EC  81 mg Oral Daily   carvedilol  3.125 mg Oral BID WC   Chlorhexidine Gluconate Cloth  6 each Topical Daily   enoxaparin (LOVENOX) injection  40 mg Subcutaneous Q24H   furosemide  20 mg Intravenous Q12H   hydrochlorothiazide  25 mg Oral Daily   insulin aspart  0-15 Units Subcutaneous TID AC & HS   losartan  100 mg Oral Daily   multivitamin with minerals  1 tablet Oral Daily   pantoprazole  40 mg Oral Daily   rosuvastatin  10 mg Oral Daily   Continuous Infusions:  PRN Meds: acetaminophen **OR** acetaminophen, magnesium hydroxide, ondansetron  **OR** ondansetron (ZOFRAN) IV, traZODone  Allergies:   No Known Allergies  Social History:   Social History   Socioeconomic History   Marital status: Married    Spouse name: Not on file   Number of children: Not on file   Years of education: Not on file   Highest education level: Not on file  Occupational History   Not on file  Tobacco Use   Smoking status: Never   Smokeless tobacco: Never  Vaping Use   Vaping Use: Never used  Substance and Sexual Activity   Alcohol use: No   Drug use: No   Sexual activity: Not on file  Other Topics Concern   Not on file  Social History Narrative   Not on file   Social Determinants of Health   Financial Resource Strain: Not on file  Food Insecurity: Not on file  Transportation Needs: Not on file  Physical Activity: Not on file  Stress: Not on file  Social Connections: Not on file  Intimate Partner Violence: Not on file    Family History:    Family History  Problem Relation Age of Onset   Colon cancer Neg Hx      ROS:  Please see the history of present illness.  All other  ROS reviewed and negative.     Physical Exam/Data:   Vitals:   04/06/21 0502 04/06/21 0700 04/06/21 0809 04/06/21 0913  BP: (!) 146/74 113/67 (!) 155/77 (!) 141/61  Pulse: 82 81 98   Resp: 19 18    Temp:      TempSrc:      SpO2: 97% 94%    Weight:      Height:        Intake/Output Summary (Last 24 hours) at 04/06/2021 0931 Last data filed at 04/06/2021 0400 Gross per 24 hour  Intake --  Output 20 ml  Net -20 ml   Last 3 Weights 04/06/2021 04/05/2021 10/10/2019  Weight (lbs) 189 lb 9.5 oz 190 lb 181 lb 14.1 oz  Weight (kg) 86 kg 86.183 kg 82.5 kg     Body mass index is 38.29 kg/m.   General:  Well nourished, well developed, in no acute distress HEENT: normal Lymph: no adenopathy Neck: no JVD Endocrine:  No thryomegaly Vascular: No carotid bruits; FA pulses 2+ bilaterally without bruits  Cardiac:  normal S1, S2; RRR; no murmur  Lungs:   mild crackles bilaterally Abd: soft, nontender, no hepatomegaly  Ext: no edema Musculoskeletal:  No deformities, BUE and BLE strength normal and equal Skin: warm and dry  Neuro:  CNs 2-12 intact, no focal abnormalities noted Psych:  Normal affect     Laboratory Data:  High Sensitivity Troponin:  No results for input(s): TROPONINIHS in the last 720 hours.   Chemistry Recent Labs  Lab 04/05/21 1814 04/06/21 0340  NA 137 138  K 3.6 3.2*  CL 93* 93*  CO2 36* 36*  GLUCOSE 109* 116*  BUN 14 11  CREATININE 0.62 0.60  CALCIUM 8.9 8.5*  GFRNONAA >60 >60  ANIONGAP 8 9    No results for input(s): PROT, ALBUMIN, AST, ALT, ALKPHOS, BILITOT in the last 168 hours. Hematology Recent Labs  Lab 04/05/21 1814 04/06/21 0340  WBC 8.9 8.1  RBC 5.28* 5.24*  HGB 15.0 14.6  HCT 50.1* 49.8*  MCV 94.9 95.0  MCH 28.4 27.9  MCHC 29.9* 29.3*  RDW 15.0 15.3  PLT 203 199   BNP Recent Labs  Lab 04/05/21 1814  BNP 146.0*    DDimer  Recent Labs  Lab 04/05/21 1814  DDIMER 0.38     Radiology/Studies:  CT Angio Chest PE W and/or Wo Contrast  Result Date: 04/05/2021 CLINICAL DATA:  Hypoxia EXAM: CT ANGIOGRAPHY CHEST WITH CONTRAST TECHNIQUE: Multidetector CT imaging of the chest was performed using the standard protocol during bolus administration of intravenous contrast. Multiplanar CT image reconstructions and MIPs were obtained to evaluate the vascular anatomy. CONTRAST:  112mL OMNIPAQUE IOHEXOL 350 MG/ML SOLN COMPARISON:  10/10/2019 FINDINGS: Cardiovascular: Contrast injection is sufficient to demonstrate satisfactory opacification of the pulmonary arteries to the segmental level. There is no pulmonary embolus or evidence of right heart strain. The size of the main pulmonary artery is normal. Heart size is normal, with no pericardial effusion. The course and caliber of the aorta are normal. There is atherosclerotic calcification. No acute aortic syndrome. Mediastinum/Nodes: No mediastinal,  hilar or axillary lymphadenopathy. Normal visualized thyroid. Thoracic esophageal course is normal. Lungs/Pleura: Bibasilar atelectasis. Upper Abdomen: Contrast bolus timing is not optimized for evaluation of the abdominal organs. The visualized portions of the organs of the upper abdomen are normal. Musculoskeletal: No chest wall abnormality. No bony spinal canal stenosis. Review of the MIP images confirms the above findings. IMPRESSION: No pulmonary embolus or other acute  thoracic abnormality. Electronically Signed   By: Ulyses Jarred M.D.   On: 04/05/2021 22:12   DG Chest Port 1 View  Result Date: 04/05/2021 CLINICAL DATA:  Short of breath EXAM: PORTABLE CHEST 1 VIEW COMPARISON:  Radiographs 03/05/2020, CT 10/09/2018 FINDINGS: Low lung volumes. Normal cardiac silhouette. Mild basilar atelectasis. No pulmonary edema. No pneumothorax. There is subtle peripheral interstitial opacities similar to comparison chest radiograph and CT. IMPRESSION: 1. No acute findings. 2. Low lung volumes. 3. Chronic interstitial markings. Electronically Signed   By: Suzy Bouchard M.D.   On: 04/05/2021 19:32     Assessment and Plan:  1.SOB/Hypoxia/Congestive heart failure, unknown etiology - unclear if sole etiology is HF as findings are fairly benign in regards to her BNP, CXR, CT scan. Exam limited by body habitus, does have some crackles in lung bases and bnp mildly elevated.  - will diurese and follow symptoms, f/u echo.  - increase IV lasix to 60mg  bid today and reasssess tomorrow.      For questions or updates, please contact [place] Please consult [email or web address] for contact info under    Signed, Carlyle Dolly, MD  04/06/2021 9:31 AM

## 2021-04-07 DIAGNOSIS — R0902 Hypoxemia: Secondary | ICD-10-CM | POA: Diagnosis present

## 2021-04-07 LAB — BASIC METABOLIC PANEL
Anion gap: 9 (ref 5–15)
BUN: 29 mg/dL — ABNORMAL HIGH (ref 8–23)
CO2: 41 mmol/L — ABNORMAL HIGH (ref 22–32)
Calcium: 9.1 mg/dL (ref 8.9–10.3)
Chloride: 88 mmol/L — ABNORMAL LOW (ref 98–111)
Creatinine, Ser: 0.77 mg/dL (ref 0.44–1.00)
GFR, Estimated: >60 mL/min (ref >60)
Glucose, Bld: 127 mg/dL — ABNORMAL HIGH (ref 70–99)
Potassium: 4.3 mmol/L (ref 3.5–5.1)
Sodium: 138 mmol/L (ref 135–145)

## 2021-04-07 LAB — GLUCOSE, CAPILLARY
Glucose-Capillary: 123 mg/dL — ABNORMAL HIGH (ref 70–99)
Glucose-Capillary: 154 mg/dL — ABNORMAL HIGH (ref 70–99)
Glucose-Capillary: 110 mg/dL — ABNORMAL HIGH (ref 70–99)
Glucose-Capillary: 175 mg/dL — ABNORMAL HIGH (ref 70–99)

## 2021-04-07 MED ORDER — FUROSEMIDE 10 MG/ML IJ SOLN: 40.0000 mg | Freq: Two times a day (BID) | INTRAMUSCULAR | Status: DC

## 2021-04-07 MED ORDER — FUROSEMIDE 10 MG/ML IJ SOLN
60.0000 mg | Freq: Two times a day (BID) | INTRAMUSCULAR | Status: DC
  Administered 2021-04-07 (×2): 60 mg via INTRAVENOUS
  Filled 2021-04-07 (×2): qty 6

## 2021-04-07 NOTE — Progress Notes (Signed)
PROGRESS NOTE    Kristina [name]  ZHG:[id number] DOB: 1951-07-20 DOA: 04/05/2021 PCP: Celene Squibb, MD   Chief Complaint  Patient presents with   Low Oxygen    Brief admission narrative:  Kristina [name] is a 70 y.o. female with medical history significant for essential hypertension, COVID-19 in January 2022 with subsequent acute respiratory failure on mechanical ventilation, diabetes mellitus as well as suspected systolic CHF with EF of 19-62% in January 2021 with mild global hypokinesis, who presented to the emergency room with acute onset of dyspnea with associated orthopnea as well as dyspnea on exertion with minimal lower extremity edema and hypoxia.  She denies any paroxysmal  nocturnal dyspnea, wheezing or fever or chills.  No nausea or vomiting or abdominal pain.  No dysuria, oliguria or hematuria or flank pain.  She had 2 COVID vaccines.  Patient work-up demonstrated hypoxia with hypercapnia on ABG; mildly elevated BMP (no accurate given obesity) and a CT angiogram revealing no evidence of pulmonary embolism or acute cardiopulmonary process outside vascular congestion.  Assessment & Plan: 1-acute respiratory failure with hypoxia in the setting of acute on chronic heart failure Repeat echo shows EF is up to 60 to 65% from 30 to 35% -Chest pain-free, ruled out for ACS by EKG and serial cardiac exam -Continue IV diuresis -Low-sodium diet, daily weights and strict I's and O's. -Cardiology consult appreciated -Hypoxia improving oxygen requirement down to 4 L via nasal cannula from 6 L and yesterday  2-essential hypertension -Appears to be stable at this time -Continue to follow vital signs.  3-morbid class II obesity -Low calorie diet, portion control and increase physical activity discussed with patient. -Body mass index is 37.98 kg/m.  4-gastroesophageal reflux disease -Continue PPI.  5-hyperlipidemia -Continue statin.  6-hypercapnia -Continue to use BiPAP  nightly -Repeat ABG in a.m. -Patient will need a sleep study as an outpatient. -Weight loss encouraged.  7-type 2 diabetes mellitus -Continue sliding scale insulin -Modify carbohydrate diet discussed with patient.   DVT prophylaxis: Lovenox Code Status: Full code Family Communication: Husband at bedside. Disposition:   Status is: Inpatient  Remains inpatient appropriate because:IV treatments appropriate due to intensity of illness or inability to take PO  Dispo: The patient is from: Home              Anticipated d/c is to: Home in a couple of days, may need home O2              Patient currently is not medically stable to d/c.   Difficult to place patient No       Consultants:  Cardiology service  Procedures:  See below for x-ray report 2D echo:   Antimicrobials:  None   Subjective: -Husband at bedside, shortness of breath persist, throughout the day able to wean oxygen down to 4 L by nasal cannula from 6 L -Very dyspneic with minimal exertion  Objective: Vitals:   04/07/21 0630 04/07/21 0723 04/07/21 1059 04/07/21 1611  BP:      Pulse: 88     Resp: 20     Temp:  97.8 F (36.6 C) (!) 97.5 F (36.4 C) 97.9 F (36.6 C)  TempSrc:  Oral Oral Oral  SpO2: 90%     Weight:      Height:        Intake/Output Summary (Last 24 hours) at 04/07/2021 1751 Last data filed at 04/06/2021 2141 Gross per 24 hour  Intake --  Output 900 ml  Net -900  ml   Filed Weights   04/05/21 1656 04/06/21 0353 04/07/21 0503  Weight: 86.2 kg 86 kg 85.3 kg    Examination:   Physical Exam  Gen:- Awake Alert, beginning sentences at rest but becomes very dyspneic with minimal activity HEENT:- Shell.AT, No sclera icterus Nose- [place] 4L/min Neck-Supple Neck,No JVD,.  Lungs-diminished breath sounds, faint bibasilar rales CV- S1, S2 normal, RRR Abd-  +ve B.Sounds, Abd Soft, No tenderness,    Extremity/Skin:- No  edema,   good pedal pulses Psych-affect is appropriate, oriented  x3 Neuro-generalized weakness, no new focal deficits, no tremors     Data Reviewed: I have personally reviewed following labs and imaging studies  CBC: Recent Labs  Lab 04/05/21 1814 04/06/21 0340  WBC 8.9 8.1  NEUTROABS 6.3  --   HGB 15.0 14.6  HCT 50.1* 49.8*  MCV 94.9 95.0  PLT 203 885    Basic Metabolic Panel: Recent Labs  Lab 04/05/21 1814 04/06/21 0340 04/07/21 0817  NA 137 138 138  K 3.6 3.2* 4.3  CL 93* 93* 88*  CO2 36* 36* 41*  GLUCOSE 109* 116* 127*  BUN 14 11 29*  CREATININE 0.62 0.60 0.77  CALCIUM 8.9 8.5* 9.1    GFR: Estimated Creatinine Clearance: 62.9 mL/min (by C-G formula based on SCr of 0.77 mg/dL).   CBG: Recent Labs  Lab 04/06/21 1702 04/06/21 2132 04/07/21 0727 04/07/21 1102 04/07/21 1614  GLUCAP 110* 148* 123* 154* 110*     Recent Results (from the past 240 hour(s))  Resp Panel by RT-PCR (Flu A&B, Covid) Nasopharyngeal Swab     Status: None   Collection Time: 04/05/21  7:26 PM   Specimen: Nasopharyngeal Swab; Nasopharyngeal(NP) swabs in vial transport medium  Result Value Ref Range Status   SARS Coronavirus 2 by RT PCR NEGATIVE NEGATIVE Final    Comment: (NOTE) SARS-CoV-2 target nucleic acids are NOT DETECTED.  The SARS-CoV-2 RNA is generally detectable in upper respiratory specimens during the acute phase of infection. The lowest concentration of SARS-CoV-2 viral copies this assay can detect is 138 copies/mL. A negative result does not preclude SARS-Cov-2 infection and should not be used as the sole basis for treatment or other patient management decisions. A negative result may occur with  improper specimen collection/handling, submission of specimen other than nasopharyngeal swab, presence of viral mutation(s) within the areas targeted by this assay, and inadequate number of viral copies(<138 copies/mL). A negative result must be combined with clinical observations, patient history, and epidemiological information. The  expected result is Negative.  Fact Sheet for Patients:  EntrepreneurPulse.com.au  Fact Sheet for Healthcare Providers:  IncredibleEmployment.be  This test is no t yet approved or cleared by the [place] FDA and  has been authorized for detection and/or diagnosis of SARS-CoV-2 by FDA under an Emergency Use Authorization (EUA). This EUA will remain  in effect (meaning this test can be used) for the duration of the COVID-19 declaration under Section 564(b)(1) of the Act, 21 U.S.C.section 360bbb-3(b)(1), unless the authorization is terminated  or revoked sooner.       Influenza A by PCR NEGATIVE NEGATIVE Final   Influenza B by PCR NEGATIVE NEGATIVE Final    Comment: (NOTE) The Xpert Xpress SARS-CoV-2/FLU/RSV plus assay is intended as an aid in the diagnosis of influenza from Nasopharyngeal swab specimens and should not be used as a sole basis for treatment. Nasal washings and aspirates are unacceptable for Xpert Xpress SARS-CoV-2/FLU/RSV testing.  Fact Sheet for Patients: EntrepreneurPulse.com.au  Fact Sheet  for Healthcare Providers: IncredibleEmployment.be  This test is not yet approved or cleared by the [place] and has been authorized for detection and/or diagnosis of SARS-CoV-2 by FDA under an Emergency Use Authorization (EUA). This EUA will remain in effect (meaning this test can be used) for the duration of the COVID-19 declaration under Section 564(b)(1) of the Act, 21 U.S.C. section 360bbb-3(b)(1), unless the authorization is terminated or revoked.  Performed at [facility], [address], [place], [place] [address]   MRSA Next Gen by PCR, Nasal     Status: None   Collection Time: 04/06/21  3:07 AM   Specimen: Nasal Mucosa; Nasal Swab  Result Value Ref Range Status   MRSA by PCR Next Gen NOT DETECTED NOT DETECTED Final    Comment: (NOTE) The GeneXpert MRSA Assay (FDA approved for  NASAL specimens only), is one component of a comprehensive MRSA colonization surveillance program. It is not intended to diagnose MRSA infection nor to guide or monitor treatment for MRSA infections. Test performance is not FDA approved in patients less than 84 years old. Performed at [facility], [address] [place], [place] [address]      Radiology Studies: CT Angio Chest PE W and/or Wo Contrast  Result Date: 04/05/2021 CLINICAL DATA:  Hypoxia EXAM: CT ANGIOGRAPHY CHEST WITH CONTRAST TECHNIQUE: Multidetector CT imaging of the chest was performed using the standard protocol during bolus administration of intravenous contrast. Multiplanar CT image reconstructions and MIPs were obtained to evaluate the vascular anatomy. CONTRAST:  171mL OMNIPAQUE IOHEXOL 350 MG/ML SOLN COMPARISON:  10/10/2019 FINDINGS: Cardiovascular: Contrast injection is sufficient to demonstrate satisfactory opacification of the pulmonary arteries to the segmental level. There is no pulmonary embolus or evidence of right heart strain. The size of the main pulmonary artery is normal. Heart size is normal, with no pericardial effusion. The course and caliber of the aorta are normal. There is atherosclerotic calcification. No acute aortic syndrome. Mediastinum/Nodes: No mediastinal, hilar or axillary lymphadenopathy. Normal visualized thyroid. Thoracic esophageal course is normal. Lungs/Pleura: Bibasilar atelectasis. Upper Abdomen: Contrast bolus timing is not optimized for evaluation of the abdominal organs. The visualized portions of the organs of the upper abdomen are normal. Musculoskeletal: No chest wall abnormality. No bony spinal canal stenosis. Review of the MIP images confirms the above findings. IMPRESSION: No pulmonary embolus or other acute thoracic abnormality. Electronically Signed   By: Ulyses Jarred M.D.   On: 04/05/2021 22:12   DG Chest Port 1 View  Result Date: 04/05/2021 CLINICAL DATA:  Short of breath EXAM:  PORTABLE CHEST 1 VIEW COMPARISON:  Radiographs 03/05/2020, CT 10/09/2018 FINDINGS: Low lung volumes. Normal cardiac silhouette. Mild basilar atelectasis. No pulmonary edema. No pneumothorax. There is subtle peripheral interstitial opacities similar to comparison chest radiograph and CT. IMPRESSION: 1. No acute findings. 2. Low lung volumes. 3. Chronic interstitial markings. Electronically Signed   By: Suzy Bouchard M.D.   On: 04/05/2021 19:32   ECHOCARDIOGRAM COMPLETE  Result Date: 04/06/2021    ECHOCARDIOGRAM REPORT   Patient Name:   KASHONDA SARKISYAN Date of Exam: 04/06/2021 Medical Rec #:  [id number]       Height:       59.0 in Accession #:    [id number]      Weight:       189.6 lb Date of Birth:  [birth year]       BSA:          1.803 m Patient Age:    62 years  BP:           155/77 mmHg Patient Gender: F               HR:           98 bpm. Exam Location:  [facility] Procedure: 2D Echo, Cardiac Doppler and Color Doppler Indications:    CHF-Acute Systolic  History:        Patient has prior history of Echocardiogram examinations, most                 recent 10/03/2019. CHF; Risk Factors:Hypertension. ARDS-COVID.  Sonographer:    Wenda Low Referring Phys: [id number] JAN A [place]  1. Left ventricular ejection fraction, by estimation, is 60 to 65%. The left ventricle has normal function. The left ventricle has no regional wall motion abnormalities. Left ventricular diastolic parameters are consistent with Grade I diastolic dysfunction (impaired relaxation).  2. Right ventricular systolic function is normal. The right ventricular size is normal. Tricuspid regurgitation signal is inadequate for assessing PA pressure.  3. The mitral valve is normal in structure. Mild mitral valve regurgitation. No evidence of mitral stenosis.  4. The aortic valve is tricuspid. There is mild calcification of the aortic valve. There is mild thickening of the aortic valve. Aortic valve regurgitation is not visualized.  No aortic stenosis is present.  5. The inferior vena cava is normal in size with greater than 50% respiratory variability, suggesting right atrial pressure of 3 mmHg. FINDINGS  Left Ventricle: Left ventricular ejection fraction, by estimation, is 60 to 65%. The left ventricle has normal function. The left ventricle has no regional wall motion abnormalities. The left ventricular internal cavity size was normal in size. There is  no left ventricular hypertrophy. Left ventricular diastolic parameters are consistent with Grade I diastolic dysfunction (impaired relaxation). Normal left ventricular filling pressure. Right Ventricle: The right ventricular size is normal. No increase in right ventricular wall thickness. Right ventricular systolic function is normal. Tricuspid regurgitation signal is inadequate for assessing PA pressure. Left Atrium: Left atrial size was normal in size. Right Atrium: Right atrial size was normal in size. Pericardium: There is no evidence of pericardial effusion. Mitral Valve: The mitral valve is normal in structure. Mild mitral valve regurgitation. No evidence of mitral valve stenosis. MV peak gradient, 3.1 mmHg. The mean mitral valve gradient is 1.0 mmHg. Tricuspid Valve: The tricuspid valve is normal in structure. Tricuspid valve regurgitation is not demonstrated. No evidence of tricuspid stenosis. Aortic Valve: The aortic valve is tricuspid. There is mild calcification of the aortic valve. There is mild thickening of the aortic valve. There is mild aortic valve annular calcification. Aortic valve regurgitation is not visualized. No aortic stenosis  is present. Aortic valve mean gradient measures 6.0 mmHg. Aortic valve peak gradient measures 10.5 mmHg. Aortic valve area, by VTI measures 2.31 cm. Pulmonic Valve: The pulmonic valve was not well visualized. Pulmonic valve regurgitation is not visualized. No evidence of pulmonic stenosis. Aorta: The aortic root is normal in size and  structure. Venous: The inferior vena cava is normal in size with greater than 50% respiratory variability, suggesting right atrial pressure of 3 mmHg. IAS/Shunts: No atrial level shunt detected by color flow Doppler.  LEFT VENTRICLE PLAX 2D LVIDd:         4.37 cm  Diastology LVIDs:         2.81 cm  LV e' medial:    5.96 cm/s LV PW:  0.92 cm  LV E/e' medial:  10.2 LV IVS:        1.08 cm  LV e' lateral:   6.31 cm/s LVOT diam:     2.10 cm  LV E/e' lateral: 9.6 LV SV:         73 LV SV Index:   41 LVOT Area:     3.46 cm  RIGHT VENTRICLE RV Basal diam:  2.87 cm RV Mid diam:    2.41 cm RV S prime:     14.10 cm/s TAPSE (M-mode): 1.9 cm LEFT ATRIUM             Index       RIGHT ATRIUM           Index LA diam:        3.70 cm 2.05 cm/m  RA Area:     11.90 cm LA Vol (A2C):   48.2 ml 26.73 ml/m RA Volume:   26.00 ml  14.42 ml/m LA Vol (A4C):   35.7 ml 19.80 ml/m LA Biplane Vol: 41.5 ml 23.02 ml/m  AORTIC VALVE AV Area (Vmax):    2.39 cm AV Area (Vmean):   2.38 cm AV Area (VTI):     2.31 cm AV Vmax:           162.00 cm/s AV Vmean:          108.000 cm/s AV VTI:            0.317 m AV Peak Grad:      10.5 mmHg AV Mean Grad:      6.0 mmHg LVOT Vmax:         112.00 cm/s LVOT Vmean:        74.300 cm/s LVOT VTI:          0.211 m LVOT/AV VTI ratio: 0.67  AORTA Ao Root diam: 3.00 cm Ao Asc diam:  3.20 cm MITRAL VALVE MV Area (PHT): 3.31 cm    SHUNTS MV Area VTI:   3.90 cm    Systemic VTI:  0.21 m MV Peak grad:  3.1 mmHg    Systemic Diam: 2.10 cm MV Mean grad:  1.0 mmHg MV Vmax:       0.88 m/s MV Vmean:      50.3 cm/s MV Decel Time: 229 msec MV E velocity: 60.85 cm/s MV A velocity: 89.10 cm/s MV E/A ratio:  0.68 Carlyle Dolly MD Electronically signed by Carlyle Dolly MD Signature Date/Time: 04/06/2021/1:46:20 PM    Final      Scheduled Meds:  amLODipine  10 mg Oral Daily   aspirin EC  81 mg Oral Daily   carvedilol  3.125 mg Oral BID WC   Chlorhexidine Gluconate Cloth  6 each Topical Daily   enoxaparin  (LOVENOX) injection  40 mg Subcutaneous Q24H   furosemide  60 mg Intravenous BID   insulin aspart  0-15 Units Subcutaneous TID AC & HS   losartan  100 mg Oral Daily   multivitamin with minerals  1 tablet Oral Daily   pantoprazole  40 mg Oral Daily   rosuvastatin  10 mg Oral Daily   Continuous Infusions:   LOS: 2 days   Roxan Hockey, MD [facility]   To contact the attending provider between 7A-7P or the covering provider during after hours 7P-7A, please log into the web site [email or web address] and access using universal [place] password for that web site. If you do not have the password, please call the hospital operator.  04/07/2021,  5:51 PM

## 2021-04-07 NOTE — Progress Notes (Addendum)
Progress Note  Patient Name: Kristina [name] Date of Encounter: 04/07/2021  [facility] Cardiologist: Carlyle Dolly, MD   Subjective   Breathing improved but not back to baseline. Had nocturnal desaturations but saturations now in the 90's. She does not require supplemental oxygen at home. No recent chest pain or palpitations.   Inpatient Medications    Scheduled Meds:  amLODipine  10 mg Oral Daily   aspirin EC  81 mg Oral Daily   carvedilol  3.125 mg Oral BID WC   Chlorhexidine Gluconate Cloth  6 each Topical Daily   enoxaparin (LOVENOX) injection  40 mg Subcutaneous Q24H   furosemide  40 mg Intravenous BID   insulin aspart  0-15 Units Subcutaneous TID AC & HS   losartan  100 mg Oral Daily   multivitamin with minerals  1 tablet Oral Daily   pantoprazole  40 mg Oral Daily   rosuvastatin  10 mg Oral Daily   Continuous Infusions:  PRN Meds: acetaminophen **OR** acetaminophen, magnesium hydroxide, ondansetron **OR** ondansetron (ZOFRAN) IV, traZODone   Vital Signs    Vitals:   04/07/21 0503 04/07/21 0600 04/07/21 0630 04/07/21 0723  BP:  106/63    Pulse:  79 88   Resp:  17 20   Temp: (!) 97.2 F (36.2 C)   97.8 F (36.6 C)  TempSrc: Axillary   Oral  SpO2:  97% 90%   Weight: 85.3 kg     Height:        Intake/Output Summary (Last 24 hours) at 04/07/2021 0846 Last data filed at 04/06/2021 2141 Gross per 24 hour  Intake --  Output 2350 ml  Net -2350 ml   Last 3 Weights 04/07/2021 04/06/2021 04/05/2021  Weight (lbs) 188 lb 0.8 oz 189 lb 9.5 oz 190 lb  Weight (kg) 85.3 kg 86 kg 86.183 kg      Telemetry    NSR, HR in 80's to 90's.  - Personally Reviewed  ECG    No new tracings.   Physical Exam   GEN: Pleasant female appearing in no acute distress.   Neck: No JVD Cardiac: RRR, no murmurs, rubs, or gallops.  Respiratory: Mild rales along right base. No wheezing.   GI: Soft, nontender, non-distended  Kristina: No pitting edema; No deformity. Neuro:   Nonfocal  Psych: Normal affect   Labs    High Sensitivity Troponin:  No results for input(s): TROPONINIHS in the last 720 hours.    Chemistry Recent Labs  Lab 04/05/21 1814 04/06/21 0340  NA 137 138  K 3.6 3.2*  CL 93* 93*  CO2 36* 36*  GLUCOSE 109* 116*  BUN 14 11  CREATININE 0.62 0.60  CALCIUM 8.9 8.5*  GFRNONAA >60 >60  ANIONGAP 8 9     Hematology Recent Labs  Lab 04/05/21 1814 04/06/21 0340  WBC 8.9 8.1  RBC 5.28* 5.24*  HGB 15.0 14.6  HCT 50.1* 49.8*  MCV 94.9 95.0  MCH 28.4 27.9  MCHC 29.9* 29.3*  RDW 15.0 15.3  PLT 203 199    BNP Recent Labs  Lab 04/05/21 1814  BNP 146.0*     DDimer  Recent Labs  Lab 04/05/21 1814  DDIMER 0.38     Radiology    CT Angio Chest PE W and/or Wo Contrast  Result Date: 04/05/2021 CLINICAL DATA:  Hypoxia EXAM: CT ANGIOGRAPHY CHEST WITH CONTRAST TECHNIQUE: Multidetector CT imaging of the chest was performed using the standard protocol during bolus administration of intravenous contrast. Multiplanar CT  image reconstructions and MIPs were obtained to evaluate the vascular anatomy. CONTRAST:  141mL OMNIPAQUE IOHEXOL 350 MG/ML SOLN COMPARISON:  10/10/2019 FINDINGS: Cardiovascular: Contrast injection is sufficient to demonstrate satisfactory opacification of the pulmonary arteries to the segmental level. There is no pulmonary embolus or evidence of right heart strain. The size of the main pulmonary artery is normal. Heart size is normal, with no pericardial effusion. The course and caliber of the aorta are normal. There is atherosclerotic calcification. No acute aortic syndrome. Mediastinum/Nodes: No mediastinal, hilar or axillary lymphadenopathy. Normal visualized thyroid. Thoracic esophageal course is normal. Lungs/Pleura: Bibasilar atelectasis. Upper Abdomen: Contrast bolus timing is not optimized for evaluation of the abdominal organs. The visualized portions of the organs of the upper abdomen are normal. Musculoskeletal: No  chest wall abnormality. No bony spinal canal stenosis. Review of the MIP images confirms the above findings. IMPRESSION: No pulmonary embolus or other acute thoracic abnormality. Electronically Signed   By: Ulyses Jarred M.D.   On: 04/05/2021 22:12   DG Chest Port 1 View  Result Date: 04/05/2021 CLINICAL DATA:  Short of breath EXAM: PORTABLE CHEST 1 VIEW COMPARISON:  Radiographs 03/05/2020, CT 10/09/2018 FINDINGS: Low lung volumes. Normal cardiac silhouette. Mild basilar atelectasis. No pulmonary edema. No pneumothorax. There is subtle peripheral interstitial opacities similar to comparison chest radiograph and CT. IMPRESSION: 1. No acute findings. 2. Low lung volumes. 3. Chronic interstitial markings. Electronically Signed   By: Suzy Bouchard M.D.   On: 04/05/2021 19:32    Cardiac Studies   Echocardiogram: 04/06/2021 IMPRESSIONS     1. Left ventricular ejection fraction, by estimation, is 60 to 65%. The  left ventricle has normal function. The left ventricle has no regional  wall motion abnormalities. Left ventricular diastolic parameters are  consistent with Grade I diastolic  dysfunction (impaired relaxation).   2. Right ventricular systolic function is normal. The right ventricular  size is normal. Tricuspid regurgitation signal is inadequate for assessing  PA pressure.   3. The mitral valve is normal in structure. Mild mitral valve  regurgitation. No evidence of mitral stenosis.   4. The aortic valve is tricuspid. There is mild calcification of the  aortic valve. There is mild thickening of the aortic valve. Aortic valve  regurgitation is not visualized. No aortic stenosis is present.   5. The inferior vena cava is normal in size with greater than 50%  respiratory variability, suggesting right atrial pressure of 3 mmHg.  Patient Profile     70 y.o. 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  Assessment & Plan    1. Acute HFpEF - BNP was mildly elevated at 146 on admission and CT showed no acute abnormalities. Echo this admission shows a preserved EF of 60-65%, no regional WMA, Grade 1 DD, normal RV function, and mild MR.  - Yesterday, she received several doses of IV Lasix (20mg  at 0047, 40mg  at 1203 and 60mg  at 1754). Recorded output of -2.3 L thus far and weight has declined by 2 lbs. A BMET has not yet been ordered for today so will order. Will dose IV Lasix 40mg  BID for now. Can titrate to 60mg  BID but would await BMET results. Her volume status has improved so would anticipate switching to PO Lasix in the next 24-48 hours.   2. Acute Hypoxic Respiratory Failure/Hypercapnia - ABG on admission showed pH 7.341 with pCO2  at 72.1. Still on 6L [place]. Continue to wean as able. Given her nocturnal desaturations, she will need a sleep study as an outpatient.   3. HTN - BP was initially elevated, improved to 106/63 - 164/61 within the past 24 hours.  - Remains on Amlodipine 10mg  daily, Coreg 3.125mg  BID and Losartan 100mg  daily. HCTZ currently held while receiving IV Lasix.   4. HLD - Followed by her PCP as an outpatient. She has been continued on Crestor 10mg  daily.   For questions or updates, please contact [place] Please consult [email or web address] for contact info under        Signed, Erma Heritage, PA-C  04/07/2021, 8:46 AM    .......................................................................................................................Marland Kitchen Attending Note:  Patient seen and examined. Agree with note by Erma Heritage, PA-C. Kristina [name] is a 70 yo female with a hx of HFpEF, HTN, Type 2 DM and prior COVID-19 infection (in 09/2019) who was admitted for worsening shortness of breath. She has been treated with IV lasix with appropriate diuresis. Renal function stable. 2lbs weight loss in 24 hours. >2 L negative. Lungs are mostly  clear to auscultation. Echo from 04/06/2021 showing EF 60-65%, grade 1 DD and mild MR. Agree with carvedilol and losartan for HF. She is on Crestor for HL and amlodipine for HTN. Continue to diurese today.  Melina Schools, MD, [facility]

## 2021-04-08 ENCOUNTER — Inpatient Hospital Stay (HOSPITAL_COMMUNITY): Payer: Medicare HMO

## 2021-04-08 LAB — GLUCOSE, CAPILLARY
Glucose-Capillary: 117 mg/dL — ABNORMAL HIGH (ref 70–99)
Glucose-Capillary: 132 mg/dL — ABNORMAL HIGH (ref 70–99)
Glucose-Capillary: 97 mg/dL (ref 70–99)
Glucose-Capillary: 152 mg/dL — ABNORMAL HIGH (ref 70–99)

## 2021-04-08 LAB — BASIC METABOLIC PANEL
Anion gap: 6 (ref 5–15)
BUN: 36 mg/dL — ABNORMAL HIGH (ref 8–23)
CO2: 42 mmol/L — ABNORMAL HIGH (ref 22–32)
Calcium: 8.9 mg/dL (ref 8.9–10.3)
Chloride: 91 mmol/L — ABNORMAL LOW (ref 98–111)
Creatinine, Ser: 0.68 mg/dL (ref 0.44–1.00)
GFR, Estimated: >60 mL/min (ref >60)
Glucose, Bld: 117 mg/dL — ABNORMAL HIGH (ref 70–99)
Potassium: 4.1 mmol/L (ref 3.5–5.1)
Sodium: 139 mmol/L (ref 135–145)

## 2021-04-08 LAB — MAGNESIUM: Magnesium: 2.4 mg/dL (ref 1.7–2.4)

## 2021-04-08 LAB — BRAIN NATRIURETIC PEPTIDE: B Natriuretic Peptide: 10 pg/mL (ref 0.0–100.0)

## 2021-04-08 MED ORDER — FUROSEMIDE 40 MG PO TABS
40.0000 mg | ORAL_TABLET | Freq: Every day | ORAL | Status: DC
  Administered 2021-04-09: 40 mg via ORAL
  Filled 2021-04-08: qty 1

## 2021-04-08 NOTE — Progress Notes (Signed)
PROGRESS NOTE    Kristina [name]  NTI:[id number] DOB: 1951-04-09 DOA: 04/05/2021 PCP: Celene Squibb, MD   Chief Complaint  Patient presents with   Low Oxygen    Brief admission narrative:  Kristina [name] is a 70 y.o. female with medical history significant for essential hypertension, COVID-19 in January 2022 with subsequent acute respiratory failure on mechanical ventilation, diabetes mellitus as well as suspected systolic CHF with EF of 86-76% in January 2021 with mild global hypokinesis, who presented to the emergency room with acute onset of dyspnea with associated orthopnea as well as dyspnea on exertion with minimal lower extremity edema and hypoxia.  She denies any paroxysmal  nocturnal dyspnea, wheezing or fever or chills.  No nausea or vomiting or abdominal pain.  No dysuria, oliguria or hematuria or flank pain.  She had 2 COVID vaccines.  Patient work-up demonstrated hypoxia with hypercapnia on ABG; mildly elevated BMP (no accurate given obesity) and a CT angiogram revealing no evidence of pulmonary embolism or acute cardiopulmonary process outside vascular congestion.  Assessment & Plan: 1-acute respiratory failure with hypoxia in the setting of acute on chronic heart failure Repeat echo shows EF is up to 60 to 65% from 30 to 35% -Chest pain-free, ruled out for ACS by EKG and serial cardiac exam -Low-sodium diet, daily weights and strict I's and O's. -Cardiology consult appreciated -Hypoxia improving oxygen requirement down to 2 L via nasal cannula from 6 L a couple of days ago -Cardiology recommends diuretic holiday today due to uptrending BUN   2)-HTN -Appears to be stable at this time -Continue to follow vital signs.  3-morbid class II obesity -Low calorie diet, portion control and increase physical activity discussed with patient. -Body mass index is 39.18 kg/m.  4-gastroesophageal reflux disease -Continue PPI.  5-hyperlipidemia -Continue  statin.  6-hypercapnia -Continue to use BiPAP nightly -Repeat ABG in a.m. -Patient will need a sleep study as an outpatient. -Weight loss encouraged.  7-type 2 diabetes mellitus -Continue sliding scale insulin -Modify carbohydrate diet discussed with patient.  -Repeat chest x-ray now  with possible pneumonia , clinically low index of suspicion for pneumonia we will repeat chest x-ray in a.m.  DVT prophylaxis: Lovenox Code Status: Full code Family Communication: Husband at bedside. Disposition:   Status is: Inpatient  Remains inpatient appropriate because: Hypoxic respiratory failure requiring diuresis and ongoing monitoring of electrolytes and renal function  Dispo: The patient is from: Home              Anticipated d/c is to: Home in a couple of days, may need home O2              Patient currently is not medically stable to d/c.   Difficult to place patient No       Consultants:  Cardiology service  Procedures:  See below for x-ray report 2D echo:   Antimicrobials:  None   Subjective: -Dyspnea persist, hypoxia improving -Voiding okay  Objective: Vitals:   04/08/21 0558 04/08/21 0741 04/08/21 1104 04/08/21 1600  BP:      Pulse: 91     Resp: (!) 22     Temp:  98 F (36.7 C) 98.4 F (36.9 C) 97.8 F (36.6 C)  TempSrc:  Oral Oral Oral  SpO2: 94%     Weight:      Height:        Intake/Output Summary (Last 24 hours) at 04/08/2021 1830 Last data filed at 04/07/2021 2341 Gross per 24 hour  Intake --  Output 550 ml  Net -550 ml   Filed Weights   04/06/21 0353 04/07/21 0503 04/08/21 0439  Weight: 86 kg 85.3 kg 88 kg    Examination:   Physical Exam  Gen:- Awake Alert, beginning sentences at rest but becomes very dyspneic with minimal activity HEENT:- [place].AT, No sclera icterus Nose-  2L/min Neck-Supple Neck,No JVD,.  Lungs-diminished breath sounds, faint bibasilar rales CV- S1, S2 normal, RRR Abd-  +ve B.Sounds, Abd Soft, No tenderness,     Extremity/Skin:- No  edema,   good pedal pulses Psych-affect is appropriate, oriented x3 Neuro-generalized weakness, no new focal deficits, no tremors     Data Reviewed: I have personally reviewed following labs and imaging studies  CBC: Recent Labs  Lab 04/05/21 1814 04/06/21 0340  WBC 8.9 8.1  NEUTROABS 6.3  --   HGB 15.0 14.6  HCT 50.1* 49.8*  MCV 94.9 95.0  PLT 203 326    Basic Metabolic Panel: Recent Labs  Lab 04/05/21 1814 04/06/21 0340 04/07/21 0817 04/08/21 0808  NA 137 138 138 139  K 3.6 3.2* 4.3 4.1  CL 93* 93* 88* 91*  CO2 36* 36* 41* 42*  GLUCOSE 109* 116* 127* 117*  BUN 14 11 29* 36*  CREATININE 0.62 0.60 0.77 0.68  CALCIUM 8.9 8.5* 9.1 8.9  MG  --   --   --  2.4    GFR: Estimated Creatinine Clearance: 64 mL/min (by C-G formula based on SCr of 0.68 mg/dL).   CBG: Recent Labs  Lab 04/07/21 1614 04/07/21 2107 04/08/21 0743 04/08/21 1106 04/08/21 1607  GLUCAP 110* 175* 117* 132* 97     Recent Results (from the past 240 hour(s))  Resp Panel by RT-PCR (Flu A&B, Covid) Nasopharyngeal Swab     Status: None   Collection Time: 04/05/21  7:26 PM   Specimen: Nasopharyngeal Swab; Nasopharyngeal(NP) swabs in vial transport medium  Result Value Ref Range Status   SARS Coronavirus 2 by RT PCR NEGATIVE NEGATIVE Final    Comment: (NOTE) SARS-CoV-2 target nucleic acids are NOT DETECTED.  The SARS-CoV-2 RNA is generally detectable in upper respiratory specimens during the acute phase of infection. The lowest concentration of SARS-CoV-2 viral copies this assay can detect is 138 copies/mL. A negative result does not preclude SARS-Cov-2 infection and should not be used as the sole basis for treatment or other patient management decisions. A negative result may occur with  improper specimen collection/handling, submission of specimen other than nasopharyngeal swab, presence of viral mutation(s) within the areas targeted by this assay, and inadequate  number of viral copies(<138 copies/mL). A negative result must be combined with clinical observations, patient history, and epidemiological information. The expected result is Negative.  Fact Sheet for Patients:  EntrepreneurPulse.com.au  Fact Sheet for Healthcare Providers:  IncredibleEmployment.be  This test is no t yet approved or cleared by the [place] FDA and  has been authorized for detection and/or diagnosis of SARS-CoV-2 by FDA under an Emergency Use Authorization (EUA). This EUA will remain  in effect (meaning this test can be used) for the duration of the COVID-19 declaration under Section 564(b)(1) of the Act, 21 U.S.C.section 360bbb-3(b)(1), unless the authorization is terminated  or revoked sooner.       Influenza A by PCR NEGATIVE NEGATIVE Final   Influenza B by PCR NEGATIVE NEGATIVE Final    Comment: (NOTE) The Xpert Xpress SARS-CoV-2/FLU/RSV plus assay is intended as an aid in the diagnosis of influenza from Nasopharyngeal swab specimens  and should not be used as a sole basis for treatment. Nasal washings and aspirates are unacceptable for Xpert Xpress SARS-CoV-2/FLU/RSV testing.  Fact Sheet for Patients: EntrepreneurPulse.com.au  Fact Sheet for Healthcare Providers: IncredibleEmployment.be  This test is not yet approved or cleared by the [place] FDA and has been authorized for detection and/or diagnosis of SARS-CoV-2 by FDA under an Emergency Use Authorization (EUA). This EUA will remain in effect (meaning this test can be used) for the duration of the COVID-19 declaration under Section 564(b)(1) of the Act, 21 U.S.C. section 360bbb-3(b)(1), unless the authorization is terminated or revoked.  Performed at [facility], [address], [place], [place] [address]   MRSA Next Gen by PCR, Nasal     Status: None   Collection Time: 04/06/21  3:07 AM   Specimen: Nasal Mucosa;  Nasal Swab  Result Value Ref Range Status   MRSA by PCR Next Gen NOT DETECTED NOT DETECTED Final    Comment: (NOTE) The GeneXpert MRSA Assay (FDA approved for NASAL specimens only), is one component of a comprehensive MRSA colonization surveillance program. It is not intended to diagnose MRSA infection nor to guide or monitor treatment for MRSA infections. Test performance is not FDA approved in patients less than 69 years old. Performed at [facility], [address], [place], [place] [address]      Radiology Studies: [facility] Chest [facility] 1 View  Result Date: 04/08/2021 CLINICAL DATA:  Shortness of breath. EXAM: PORTABLE CHEST 1 VIEW COMPARISON:  April 05, 2021. FINDINGS: Cardiomediastinal contours and hilar structures are stable. Central pulmonary vascular congestion. Increasing consolidative changes at the LEFT lung base with obscured LEFT hemidiaphragm since previous imaging. RIGHT lung is clear. No definite effusion though increasing opacity at the LEFT lung base could be due in part to pleural fluid. On limited assessment no acute skeletal process. IMPRESSION: 1. Increasing consolidative changes at the LEFT lung base with obscured LEFT hemidiaphragm since previous imaging. Suspicious for LEFT lower lobe pneumonia. 2. Central pulmonary vascular congestion. Electronically Signed   By: Zetta Bills M.D.   On: 04/08/2021 13:33     Scheduled Meds:  amLODipine  10 mg Oral Daily   aspirin EC  81 mg Oral Daily   carvedilol  3.125 mg Oral BID WC   Chlorhexidine Gluconate Cloth  6 each Topical Daily   enoxaparin (LOVENOX) injection  40 mg Subcutaneous Q24H   [START ON 04/09/2021] furosemide  40 mg Oral Daily   insulin aspart  0-15 Units Subcutaneous TID AC & HS   losartan  100 mg Oral Daily   multivitamin with minerals  1 tablet Oral Daily   pantoprazole  40 mg Oral Daily   rosuvastatin  10 mg Oral Daily   Continuous Infusions:   LOS: 3 days   Roxan Hockey, MD [facility]   To  contact the attending provider between 7A-7P or the covering provider during after hours 7P-7A, please log into the web site [email or web address] and access using universal [place] password for that web site. If you do not have the password, please call the hospital operator.  04/08/2021, 6:30 PM

## 2021-04-08 NOTE — Progress Notes (Signed)
Labs show stable Cr, but uptrending BUN and bicarb, BNP 146-->10. CXR with some ventral vascular congestion, questions of possible LLL early infiltrate. Would hold IV diuretic today, start oral lasix 40mg  daily tomorrow   Carlyle Dolly MD

## 2021-04-08 NOTE — Progress Notes (Signed)
Progress Note  Patient Name: Kristina [name] Date of Encounter: 04/08/2021  [facility] Cardiologist: Carlyle Dolly, MD   Subjective   Breathing is improving but not back to baseline.   Inpatient Medications    Scheduled Meds:  amLODipine  10 mg Oral Daily   aspirin EC  81 mg Oral Daily   carvedilol  3.125 mg Oral BID WC   Chlorhexidine Gluconate Cloth  6 each Topical Daily   enoxaparin (LOVENOX) injection  40 mg Subcutaneous Q24H   furosemide  60 mg Intravenous BID   insulin aspart  0-15 Units Subcutaneous TID AC & HS   losartan  100 mg Oral Daily   multivitamin with minerals  1 tablet Oral Daily   pantoprazole  40 mg Oral Daily   rosuvastatin  10 mg Oral Daily   Continuous Infusions:  PRN Meds: acetaminophen **OR** acetaminophen, magnesium hydroxide, ondansetron **OR** ondansetron (ZOFRAN) IV, traZODone   Vital Signs    Vitals:   04/08/21 0439 04/08/21 0500 04/08/21 0558 04/08/21 0741  BP:  112/72    Pulse:  78 91   Resp:  16 (!) 22   Temp: 97.8 F (36.6 C)   98 F (36.7 C)  TempSrc: Axillary   Oral  SpO2:  94% 94%   Weight: 88 kg     Height:        Intake/Output Summary (Last 24 hours) at 04/08/2021 0745 Last data filed at 04/07/2021 2341 Gross per 24 hour  Intake --  Output 550 ml  Net -550 ml   Last 3 Weights 04/08/2021 04/07/2021 04/06/2021  Weight (lbs) 194 lb 0.1 oz 188 lb 0.8 oz 189 lb 9.5 oz  Weight (kg) 88 kg 85.3 kg 86 kg      Telemetry    NSR- Personally Reviewed  ECG    N/A - Personally Reviewed  Physical Exam   GEN: No acute distress.   Neck: No JVD Cardiac: RRR, no murmurs, rubs, or gallops.  Respiratory: faint crackles bilateral lung bases GI: Soft, nontender, non-distended  MS: No edema; No deformity. Neuro:  Nonfocal  Psych: Normal affect   Labs    High Sensitivity Troponin:  No results for input(s): TROPONINIHS in the last 720 hours.    Chemistry Recent Labs  Lab 04/05/21 1814 04/06/21 0340 04/07/21 0817   NA 137 138 138  K 3.6 3.2* 4.3  CL 93* 93* 88*  CO2 36* 36* 41*  GLUCOSE 109* 116* 127*  BUN 14 11 29*  CREATININE 0.62 0.60 0.77  CALCIUM 8.9 8.5* 9.1  GFRNONAA >60 >60 >60  ANIONGAP 8 9 9      Hematology Recent Labs  Lab 04/05/21 1814 04/06/21 0340  WBC 8.9 8.1  RBC 5.28* 5.24*  HGB 15.0 14.6  HCT 50.1* 49.8*  MCV 94.9 95.0  MCH 28.4 27.9  MCHC 29.9* 29.3*  RDW 15.0 15.3  PLT 203 199    BNP Recent Labs  Lab 04/05/21 1814  BNP 146.0*     DDimer  Recent Labs  Lab 04/05/21 1814  DDIMER 0.38     Radiology    ECHOCARDIOGRAM COMPLETE  Result Date: 04/06/2021    ECHOCARDIOGRAM REPORT   Patient Name:   Kristina [name] Date of Exam: 04/06/2021 Medical Rec #:  [id number]       Height:       59.0 in Accession #:    [id number]      Weight:       189.6 lb Date of Birth:  1951/01/28       BSA:          1.803 m Patient Age:    70 years        BP:           155/77 mmHg Patient Gender: F               HR:           98 bpm. Exam Location:  [facility] Procedure: 2D Echo, Cardiac Doppler and Color Doppler Indications:    CHF-Acute Systolic  History:        Patient has prior history of Echocardiogram examinations, most                 recent 10/03/2019. CHF; Risk Factors:Hypertension. ARDS-COVID.  Sonographer:    Wenda Low Referring Phys: [id number] JAN A [place]  1. Left ventricular ejection fraction, by estimation, is 60 to 65%. The left ventricle has normal function. The left ventricle has no regional wall motion abnormalities. Left ventricular diastolic parameters are consistent with Grade I diastolic dysfunction (impaired relaxation).  2. Right ventricular systolic function is normal. The right ventricular size is normal. Tricuspid regurgitation signal is inadequate for assessing PA pressure.  3. The mitral valve is normal in structure. Mild mitral valve regurgitation. No evidence of mitral stenosis.  4. The aortic valve is tricuspid. There is mild calcification of the  aortic valve. There is mild thickening of the aortic valve. Aortic valve regurgitation is not visualized. No aortic stenosis is present.  5. The inferior vena cava is normal in size with greater than 50% respiratory variability, suggesting right atrial pressure of 3 mmHg. FINDINGS  Left Ventricle: Left ventricular ejection fraction, by estimation, is 60 to 65%. The left ventricle has normal function. The left ventricle has no regional wall motion abnormalities. The left ventricular internal cavity size was normal in size. There is  no left ventricular hypertrophy. Left ventricular diastolic parameters are consistent with Grade I diastolic dysfunction (impaired relaxation). Normal left ventricular filling pressure. Right Ventricle: The right ventricular size is normal. No increase in right ventricular wall thickness. Right ventricular systolic function is normal. Tricuspid regurgitation signal is inadequate for assessing PA pressure. Left Atrium: Left atrial size was normal in size. Right Atrium: Right atrial size was normal in size. Pericardium: There is no evidence of pericardial effusion. Mitral Valve: The mitral valve is normal in structure. Mild mitral valve regurgitation. No evidence of mitral valve stenosis. MV peak gradient, 3.1 mmHg. The mean mitral valve gradient is 1.0 mmHg. Tricuspid Valve: The tricuspid valve is normal in structure. Tricuspid valve regurgitation is not demonstrated. No evidence of tricuspid stenosis. Aortic Valve: The aortic valve is tricuspid. There is mild calcification of the aortic valve. There is mild thickening of the aortic valve. There is mild aortic valve annular calcification. Aortic valve regurgitation is not visualized. No aortic stenosis  is present. Aortic valve mean gradient measures 6.0 mmHg. Aortic valve peak gradient measures 10.5 mmHg. Aortic valve area, by VTI measures 2.31 cm. Pulmonic Valve: The pulmonic valve was not well visualized. Pulmonic valve regurgitation  is not visualized. No evidence of pulmonic stenosis. Aorta: The aortic root is normal in size and structure. Venous: The inferior vena cava is normal in size with greater than 50% respiratory variability, suggesting right atrial pressure of 3 mmHg. IAS/Shunts: No atrial level shunt detected by color flow Doppler.  LEFT VENTRICLE PLAX 2D LVIDd:  4.37 cm  Diastology LVIDs:         2.81 cm  LV e' medial:    5.96 cm/s LV PW:         0.92 cm  LV E/e' medial:  10.2 LV IVS:        1.08 cm  LV e' lateral:   6.31 cm/s LVOT diam:     2.10 cm  LV E/e' lateral: 9.6 LV SV:         73 LV SV Index:   41 LVOT Area:     3.46 cm  RIGHT VENTRICLE RV Basal diam:  2.87 cm RV Mid diam:    2.41 cm RV S prime:     14.10 cm/s TAPSE (M-mode): 1.9 cm LEFT ATRIUM             Index       RIGHT ATRIUM           Index LA diam:        3.70 cm 2.05 cm/m  RA Area:     11.90 cm LA Vol (A2C):   48.2 ml 26.73 ml/m RA Volume:   26.00 ml  14.42 ml/m LA Vol (A4C):   35.7 ml 19.80 ml/m LA Biplane Vol: 41.5 ml 23.02 ml/m  AORTIC VALVE AV Area (Vmax):    2.39 cm AV Area (Vmean):   2.38 cm AV Area (VTI):     2.31 cm AV Vmax:           162.00 cm/s AV Vmean:          108.000 cm/s AV VTI:            0.317 m AV Peak Grad:      10.5 mmHg AV Mean Grad:      6.0 mmHg LVOT Vmax:         112.00 cm/s LVOT Vmean:        74.300 cm/s LVOT VTI:          0.211 m LVOT/AV VTI ratio: 0.67  AORTA Ao Root diam: 3.00 cm Ao Asc diam:  3.20 cm MITRAL VALVE MV Area (PHT): 3.31 cm    SHUNTS MV Area VTI:   3.90 cm    Systemic VTI:  0.21 m MV Peak grad:  3.1 mmHg    Systemic Diam: 2.10 cm MV Mean grad:  1.0 mmHg MV Vmax:       0.88 m/s MV Vmean:      50.3 cm/s MV Decel Time: 229 msec MV E velocity: 60.85 cm/s MV A velocity: 89.10 cm/s MV E/A ratio:  0.68 Carlyle Dolly MD Electronically signed by Carlyle Dolly MD Signature Date/Time: 04/06/2021/1:46:20 PM    Final     Cardiac Studies    Patient Profile     Ms. Fasching 70 yo female history of HTN, COVID in Jan  2022 was vent at the time, DM2, mild LV systolic dysfunction with H&P reporting an LVEF of 45-50% in Jan 2012 ( I do not see study in epic), presented with DOE, LE edema. Sats 84% on presentation, was transiently on bipap.   Assessment & Plan    Acute on chronic diastolic HF - echo [id number]%, grade I dd, normal RV function - I/Os are incomplete, weight appear inaccurate. Has been on IV lasix 60mg  bid,  Uptrend in Cr/BUN with diuresis.  - echo with only mild diastolic dysfunction, mildly elevated BNP on exam, CXR and CT not overall impressive for pulmonary edema. Unclear that HF was the  sole etiology of her SOB and hypoxia however there has not been clear signs of another cause - repeat BNP, pcxr  - f/u labs today, hold diuretic until labs are back. Improving but still 3L O2 requirement.     2. Nocturnal hypoxia - needs outpatient sleep study   For questions or updates, please contact [place] Please consult [email or web address] for contact info under        Signed, Carlyle Dolly, MD  04/08/2021, 7:45 AM

## 2021-04-08 NOTE — Care Management Important Message (Signed)
Important Message  Patient Details  Name: Kristina [name] MRN: [id number] Date of Birth: [birth year]   Medicare Important Message Given:  Yes     Tommy Medal 04/08/2021, 4:06 PM

## 2021-04-09 ENCOUNTER — Inpatient Hospital Stay (HOSPITAL_COMMUNITY): Payer: Medicare HMO

## 2021-04-09 DIAGNOSIS — I5022 Chronic systolic (congestive) heart failure: Secondary | ICD-10-CM | POA: Diagnosis present

## 2021-04-09 DIAGNOSIS — I5032 Chronic diastolic (congestive) heart failure: Secondary | ICD-10-CM | POA: Diagnosis present

## 2021-04-09 DIAGNOSIS — J811 Chronic pulmonary edema: Secondary | ICD-10-CM | POA: Diagnosis not present

## 2021-04-09 DIAGNOSIS — I517 Cardiomegaly: Secondary | ICD-10-CM | POA: Diagnosis not present

## 2021-04-09 DIAGNOSIS — R0600 Dyspnea, unspecified: Secondary | ICD-10-CM | POA: Diagnosis not present

## 2021-04-09 DIAGNOSIS — I5033 Acute on chronic diastolic (congestive) heart failure: Secondary | ICD-10-CM | POA: Diagnosis present

## 2021-04-09 DIAGNOSIS — R0602 Shortness of breath: Secondary | ICD-10-CM | POA: Diagnosis not present

## 2021-04-09 DIAGNOSIS — J9601 Acute respiratory failure with hypoxia: Secondary | ICD-10-CM | POA: Diagnosis present

## 2021-04-09 LAB — BASIC METABOLIC PANEL
Anion gap: 8 (ref 5–15)
BUN: 33 mg/dL — ABNORMAL HIGH (ref 8–23)
CO2: 38 mmol/L — ABNORMAL HIGH (ref 22–32)
Calcium: 8.8 mg/dL — ABNORMAL LOW (ref 8.9–10.3)
Chloride: 92 mmol/L — ABNORMAL LOW (ref 98–111)
Creatinine, Ser: 0.68 mg/dL (ref 0.44–1.00)
GFR, Estimated: >60 mL/min (ref >60)
Glucose, Bld: 112 mg/dL — ABNORMAL HIGH (ref 70–99)
Potassium: 3.8 mmol/L (ref 3.5–5.1)
Sodium: 138 mmol/L (ref 135–145)

## 2021-04-09 LAB — GLUCOSE, CAPILLARY
Glucose-Capillary: 112 mg/dL — ABNORMAL HIGH (ref 70–99)
Glucose-Capillary: 119 mg/dL — ABNORMAL HIGH (ref 70–99)

## 2021-04-09 LAB — CBC
HCT: 48.8 % — ABNORMAL HIGH (ref 36.0–46.0)
Hemoglobin: 14.1 g/dL (ref 12.0–15.0)
MCH: 27.9 pg (ref 26.0–34.0)
MCHC: 28.9 g/dL — ABNORMAL LOW (ref 30.0–36.0)
MCV: 96.6 fL (ref 80.0–100.0)
Platelets: 186 10*3/uL (ref 150–400)
RBC: 5.05 MIL/uL (ref 3.87–5.11)
RDW: 15.5 % (ref 11.5–15.5)
WBC: 7.7 10*3/uL (ref 4.0–10.5)
nRBC: 0 % (ref 0.0–0.2)

## 2021-04-09 MED ORDER — CARVEDILOL 3.125 MG PO TABS: 3.1250 mg | ORAL_TABLET | Freq: Two times a day (BID) | ORAL | 5 refills | Status: DC

## 2021-04-09 MED ORDER — ASPIRIN EC 81 MG PO TBEC: 81.0000 mg | DELAYED_RELEASE_TABLET | Freq: Every day | ORAL | 11 refills | Status: DC

## 2021-04-09 MED ORDER — AMLODIPINE BESYLATE 10 MG PO TABS: 10.0000 mg | ORAL_TABLET | Freq: Every day | ORAL | 3 refills | Status: AC

## 2021-04-09 MED ORDER — ACETAMINOPHEN 325 MG PO TABS: 650.0000 mg | ORAL_TABLET | Freq: Four times a day (QID) | ORAL | 0 refills | Status: AC | PRN

## 2021-04-09 MED ORDER — ALBUTEROL SULFATE (2.5 MG/3ML) 0.083% IN NEBU: 2.5000 mg | INHALATION_SOLUTION | Freq: Four times a day (QID) | RESPIRATORY_TRACT | 12 refills | Status: DC | PRN

## 2021-04-09 MED ORDER — LOSARTAN POTASSIUM 100 MG PO TABS: 100.0000 mg | ORAL_TABLET | Freq: Every day | ORAL | 5 refills | Status: AC

## 2021-04-09 MED ORDER — FUROSEMIDE 40 MG PO TABS: 40.0000 mg | ORAL_TABLET | Freq: Every day | ORAL | 2 refills | Status: AC

## 2021-04-09 NOTE — TOC Transition Note (Signed)
Transition of Care [facility]) - CM/SW Discharge Note   Patient Details  Name: Kristina [name] MRN: [id number] Date of Birth: 1951/02/07  [facility] CM/SW Contact:  Natasha Bence, LCSW Phone Number: 04/09/2021, 2:13 PM   Clinical Narrative:    CSW notified of patient's readiness for discharge and O2 needs. Patient reported that she is unable to pay for O2 copay if it exceeds $100. CSW contacted [place] with [employer]. Jasmine agreeable to run [employer]. Jasmine reported that Copay will only be $21 and that patient reported that they are agreeable to O2 copay. [employer] agreeable to provide O2. TOC signing off.   Final next level of care: Home/Self Care Barriers to Discharge: Barriers Resolved   Patient Goals and CMS Choice Patient states their goals for this hospitalization and ongoing recovery are:: Return home CMS Medicare.gov Compare Post Acute Care list provided to:: Patient    Discharge Placement                    Patient and family notified of of transfer: 04/09/21  Discharge Plan and Services                DME Arranged: Oxygen DME Agency: [employer] Date DME Agency Contacted: 04/09/21 Time DME Agency Contacted: [phone] Representative spoke with at DME Agency: [place] [facility]) Interventions     Readmission Risk Interventions No flowsheet data found.

## 2021-04-09 NOTE — Progress Notes (Signed)
Received oxygen from service. IV access removed and discharge paperwork given and educated on. Patient dressed herself and husband retrieved car for transport home. Placed in Wheelchair for discharge.

## 2021-04-09 NOTE — Progress Notes (Signed)
Oxygen saturation sustaining in the 70s.  Patient currently on BIPAP and oxygen increased from 35% to 45% without resolution of hypoxia. Respiratory made aware.

## 2021-04-09 NOTE — Discharge Instructions (Signed)
1)You most likely have Sleep Apnea---Follow - up with Dr. Kara Mead Pulmonologist in [place]- to set up Sleep Study --You may need a CPAP Machine if your Test confirms Sleep Apnea-- -Address: [address] [place], [place] [address] Phone: [phone] OR call [phone] -Patient needs to be seen in the [place] office, Not in [place]  2)You need oxygen at home at 2 L via nasal cannula continuously while awake and while asleep--- smoking or having open fires around oxygen can cause fire, significant injury and death  3)Avoid ibuprofen/Advil/Aleve/Motrin/Goody Powders/Naproxen/BC powders/Meloxicam/Diclofenac/Indomethacin and other Nonsteroidal anti-inflammatory medications as these will make you more likely to bleed and can cause stomach ulcers, can also cause Kidney problems.   4)Stop HCTZ/hydrochlorothiazide and instead Start Lasix 40 mg daily  5)Very low-salt diet advised  6)Weigh yourself daily, call if you gain more than 3 pounds in 1 day or more than 5 pounds in 1 week as your diuretic medications may need to be adjusted

## 2021-04-09 NOTE — Progress Notes (Signed)
SATURATION QUALIFICATIONS: (This note is used to comply with regulatory documentation for home oxygen)  Patient Saturations on Room Air at Rest = 90%  Patient Saturations on Room Air while Ambulating = 82%  Patient Saturations on 2 Liters of oxygen while Ambulating = 92%  Please briefly explain why patient needs home oxygen: 

## 2021-04-09 NOTE — Discharge Summary (Signed)
Kristina [name], is a 70 y.o. female  DOB 1951/08/06  MRN [id number].  Admission date:  04/05/2021  Admitting Physician  Christel Mormon, MD  Discharge Date:  04/09/2021   Primary MD  Celene Squibb, MD  Recommendations for primary care physician for things to follow:   1)You most likely have Sleep Apnea---Follow - up with Dr. Kara Mead Pulmonologist in [place]- to set up Sleep Study --You may need a CPAP Machine if your Test confirms Sleep Apnea-- -Address: [address], [place], [place] [address] Phone: [phone] OR call [phone] -Patient needs to be seen in the [place] office, Not in [place]  2)You need oxygen at home at 2 L via nasal cannula continuously while awake and while asleep--- smoking or having open fires around oxygen can cause fire, significant injury and death  3)Avoid ibuprofen/Advil/Aleve/Motrin/Goody Powders/Naproxen/BC powders/Meloxicam/Diclofenac/Indomethacin and other Nonsteroidal anti-inflammatory medications as these will make you more likely to bleed and can cause stomach ulcers, can also cause Kidney problems.   4)Stop HCTZ/hydrochlorothiazide and instead Start Lasix 40 mg daily  5)Very low-salt diet advised  6)Weigh yourself daily, call if you gain more than 3 pounds in 1 day or more than 5 pounds in 1 week as your diuretic medications may need to be adjusted   Admission Diagnosis  Hypoxia [R09.02] Acute CHF (congestive heart failure) (HCC) [I50.9]   Discharge Diagnosis  Hypoxia [R09.02] Acute CHF (congestive heart failure) ([place]) [I50.9]    Principal Problem:   Acute on chronic heart failure with preserved ejection fraction (HFpEF) /Acute on Chronic Diastolic CHF Active Problems:   Hypoxia   Acute respiratory failure with hypoxia (HCC)   Essential hypertension   Uncontrolled diabetes mellitus ([place])      Past Medical History:  Diagnosis Date    Hypertension     Past Surgical History:  Procedure Laterality Date   COLONOSCOPY N/A 12/21/2016   Procedure: COLONOSCOPY;  Surgeon: Rogene Houston, MD;  [facility]: [facility];  Service: Endoscopy;  Laterality: N/A;  930   NO PAST SURGERIES       HPI  from the history and physical done on the day of admission:    Kristina [name] is a 70 y.o. female with medical history significant for essential hypertension, COVID-19 in January 2022 with subsequent acute respiratory failure on mechanical ventilation, diabetes mellitus as well as suspected systolic CHF with EF of 71-69% in January 2021 with mild global hypokinesis, who presented to the emergency room with acute onset of dyspnea with associated orthopnea as well as dyspnea on exertion with minimal lower extremity edema and hypoxia.  She denies any paroxysmal  nocturnal dyspnea, wheezing or fever or chills.  No nausea or vomiting or abdominal pain.  No dysuria, oliguria or hematuria or flank pain.  She had 2 COVID vaccines.   ED Course: Upon presentation to the emergency room blood pressure was 184/93 with a heart rate of 103 and pulse continues 84% on room air and pulse oximetry of 96% on 4 L of  O2 by nasal cannula.  ABG showed pH 7.34 and PCO2 72.1 and PO2 of 55 with HCO3 of 32.6 and O2 sat of 85.  2% on 2 L of O2 by nasal cannula.  BMP revealed CO2 36 and BNP came back 146.  CBC was unremarkable.  Influenza antigens and COVID-19 PCR came back negative. EKG as reviewed by me : Labs revealed ABG with pH seven-pointEKG showed sinus tachycardia with rate of 102 with poor R wave progression and Q waves inferiorly Imaging: Chest x-ray showed no acute cardiopulmonary disease and low lung volumes with chronic interstitial markings.  Chest CTA revealed no evidence for PE or acute cardiopulmonary disease.  The patient was given 20 mg of IV Lasix.  She will be admitted to a telemetry bed for further evaluation and management     Hospital Course:        A/p   1-acute respiratory failure with hypoxia in the setting of acute on chronic heart failure Repeat echo shows EF is up to 60 to 65% from 30 to 35% -Chest pain-free, ruled out for ACS by EKG and serial cardiac exam -Cardiology consult appreciated -Hypoxia improving oxygen requirement down to 2 L via nasal cannula from 6 L a couple of days ago -Overall much improved with diuresis  -Okay to discharge home on oxygen at 2 L via nasal cannula     2)-HTN -Appears to be stable at this time   3-morbid class II obesity -Low calorie diet, portion control and increase physical activity discussed with patient. -Body mass index is 39.18 kg/m.   4-gastroesophageal reflux disease PPI advised   5-hyperlipidemia -Continue statin.   6-hypercapnia Did well with BiPAP here in the hospital -Outpatient sleep study advised -Weight loss encouraged.   7-type 2 diabetes mellitus -Modify carbohydrate diet discussed with patient.   -Repeat chest x-ray now  with possible pneumonia , clinically low index of suspicion for pneumonia we will repeat chest x-ray in a.m.   DVT prophylaxis: Lovenox Code Status: Full code Family Communication: Husband at bedside. Disposition:    Dispo: The patient is from: Home              Anticipated d/c is to: Home with home O2     Consultants:  Cardiology service  Discharge Condition: Stable  Follow UP   Follow-up Information     Rigoberto Noel, MD. Schedule an appointment as soon as possible for a visit in 1 week(s).   Specialty: Pulmonary Disease Why: Needs to be seen in [facility] [place] [facility] Contact information: [place] [address] [phone]                  Consults obtained - cardiology  Diet and Activity recommendation:  As advised  Discharge Instructions  * Discharge Instructions     Call MD for:  difficulty breathing, headache or visual disturbances   Complete by: As directed    Call MD for:   persistant dizziness or light-headedness   Complete by: As directed    Call MD for:  persistant nausea and vomiting   Complete by: As directed    Call MD for:  severe uncontrolled pain   Complete by: As directed    Call MD for:  temperature >100.4   Complete by: As directed    Diet - low sodium heart healthy   Complete by: As directed    Diet Carb Modified   Complete by: As directed  Discharge instructions   Complete by: As directed    1)You most likely have Sleep Apnea---Follow - up with Dr. Kara Mead Pulmonologist in [place]- to set up Sleep Study --You may need a CPAP Machine if your Test confirms Sleep Apnea-- -Address: [address] [place], [place] [address] Phone: [phone] OR call [phone] -Patient needs to be seen in the [place] office, Not in [place]  2)You need oxygen at home at 2 L via nasal cannula continuously while awake and while asleep--- smoking or having open fires around oxygen can cause fire, significant injury and death  3)Avoid ibuprofen/Advil/Aleve/Motrin/Goody Powders/Naproxen/BC powders/Meloxicam/Diclofenac/Indomethacin and other Nonsteroidal anti-inflammatory medications as these will make you more likely to bleed and can cause stomach ulcers, can also cause Kidney problems.   4)Stop HCTZ/hydrochlorothiazide and instead Start Lasix 40 mg daily  5)Very low-salt diet advised  6)Weigh yourself daily, call if you gain more than 3 pounds in 1 day or more than 5 pounds in 1 week as your diuretic medications may need to be adjusted   Increase activity slowly   Complete by: As directed         Discharge Medications     Allergies as of 04/09/2021   No Known Allergies      Medication List     STOP taking these medications    enoxaparin 40 MG/0.4ML injection Commonly known as: LOVENOX   hydrochlorothiazide 25 MG tablet Commonly known as: HYDRODIURIL   losartan-hydrochlorothiazide 100-25 MG tablet Commonly known as: HYZAAR    ondansetron 4 MG tablet Commonly known as: ZOFRAN       TAKE these medications    acetaminophen 325 MG tablet Commonly known as: TYLENOL Take 2 tablets (650 mg total) by mouth every 6 (six) hours as needed for mild pain (or Fever >/= 101).   albuterol (2.5 MG/3ML) 0.083% nebulizer solution Commonly known as: PROVENTIL Take 3 mLs (2.5 mg total) by nebulization every 6 (six) hours as needed for wheezing or shortness of breath. What changed:  when to take this reasons to take this   amLODipine 10 MG tablet Commonly known as: NORVASC Take 1 tablet (10 mg total) by mouth daily.   aspirin EC 81 MG tablet Take 1 tablet (81 mg total) by mouth daily with breakfast. What changed: when to take this   carvedilol 3.125 MG tablet Commonly known as: COREG Take 1 tablet (3.125 mg total) by mouth 2 (two) times daily with a meal.   furosemide 40 MG tablet Commonly known as: LASIX Take 1 tablet (40 mg total) by mouth daily. Start taking on: April 10, 2021   losartan 100 MG tablet Commonly known as: COZAAR Take 1 tablet (100 mg total) by mouth daily. Start taking on: April 10, 2021   multivitamin tablet Take 1 tablet by mouth daily.   omeprazole 40 MG capsule Commonly known as: PRILOSEC Take 1 capsule by mouth daily.   rosuvastatin 10 MG tablet Commonly known as: CRESTOR Take 10 mg by mouth daily.               Durable Medical Equipment  (From admission, onward)           Start     Ordered   04/09/21 1238  For home use only DME oxygen  Once       Comments: SATURATION QUALIFICATIONS: (This note is used to comply with regulatory documentation for home oxygen)   Patient Saturations on Room Air at Rest = 90%   Patient Saturations on Room  Air while Ambulating = 82%   Patient Saturations on 2 Liters of oxygen while Ambulating = 92%   Please briefly explain why patient needs home oxygen:  Question Answer Comment  Length of Need Lifetime   Mode or (Route) Nasal  cannula   Liters per Minute 2   Frequency Continuous (stationary and portable oxygen unit needed)   Oxygen conserving device Yes   Oxygen delivery system Gas      04/09/21 1237            Major procedures and Radiology Reports - PLEASE review detailed and final reports for all details, in brief -    DG Chest 2 View  Result Date: 04/09/2021 CLINICAL DATA:  Shortness of breath. Dyspnea and respiratory abnormalities. EXAM: CHEST - 2 VIEW COMPARISON:  One-view chest x-ray 04/08/2021 FINDINGS: Heart is enlarged. Left hemidiaphragm remains elevated. Mild pulmonary vascular congestion is present. Asymmetric left basilar airspace disease is present, improved from the prior exam. No significant effusion is present. IMPRESSION: 1. Stable cardiomegaly and mild pulmonary vascular congestion. 2. Chronic left hemidiaphragm elevation. 3. Airspace disease at the left base likely reflects atelectasis. Electronically Signed   By: San Morelle M.D.   On: 04/09/2021 12:06   CT Angio Chest PE W and/or Wo Contrast  Result Date: 04/05/2021 CLINICAL DATA:  Hypoxia EXAM: CT ANGIOGRAPHY CHEST WITH CONTRAST TECHNIQUE: Multidetector CT imaging of the chest was performed using the standard protocol during bolus administration of intravenous contrast. Multiplanar CT image reconstructions and MIPs were obtained to evaluate the vascular anatomy. CONTRAST:  142mL OMNIPAQUE IOHEXOL 350 MG/ML SOLN COMPARISON:  10/10/2019 FINDINGS: Cardiovascular: Contrast injection is sufficient to demonstrate satisfactory opacification of the pulmonary arteries to the segmental level. There is no pulmonary embolus or evidence of right heart strain. The size of the main pulmonary artery is normal. Heart size is normal, with no pericardial effusion. The course and caliber of the aorta are normal. There is atherosclerotic calcification. No acute aortic syndrome. Mediastinum/Nodes: No mediastinal, hilar or axillary lymphadenopathy. Normal  visualized thyroid. Thoracic esophageal course is normal. Lungs/Pleura: Bibasilar atelectasis. Upper Abdomen: Contrast bolus timing is not optimized for evaluation of the abdominal organs. The visualized portions of the organs of the upper abdomen are normal. Musculoskeletal: No chest wall abnormality. No bony spinal canal stenosis. Review of the MIP images confirms the above findings. IMPRESSION: No pulmonary embolus or other acute thoracic abnormality. Electronically Signed   By: Ulyses Jarred M.D.   On: 04/05/2021 22:12   DG Chest Port 1 View  Result Date: 04/08/2021 CLINICAL DATA:  Shortness of breath. EXAM: PORTABLE CHEST 1 VIEW COMPARISON:  April 05, 2021. FINDINGS: Cardiomediastinal contours and hilar structures are stable. Central pulmonary vascular congestion. Increasing consolidative changes at the LEFT lung base with obscured LEFT hemidiaphragm since previous imaging. RIGHT lung is clear. No definite effusion though increasing opacity at the LEFT lung base could be due in part to pleural fluid. On limited assessment no acute skeletal process. IMPRESSION: 1. Increasing consolidative changes at the LEFT lung base with obscured LEFT hemidiaphragm since previous imaging. Suspicious for LEFT lower lobe pneumonia. 2. Central pulmonary vascular congestion. Electronically Signed   By: Zetta Bills M.D.   On: 04/08/2021 13:33   DG Chest Port 1 View  Result Date: 04/05/2021 CLINICAL DATA:  Short of breath EXAM: PORTABLE CHEST 1 VIEW COMPARISON:  Radiographs 03/05/2020, CT 10/09/2018 FINDINGS: Low lung volumes. Normal cardiac silhouette. Mild basilar atelectasis. No pulmonary edema. No pneumothorax. There is subtle peripheral  interstitial opacities similar to comparison chest radiograph and CT. IMPRESSION: 1. No acute findings. 2. Low lung volumes. 3. Chronic interstitial markings. Electronically Signed   By: Suzy Bouchard M.D.   On: 04/05/2021 19:32   ECHOCARDIOGRAM COMPLETE  Result Date:  04/06/2021    ECHOCARDIOGRAM REPORT   Patient Name:   Kristina [name] Date of Exam: 04/06/2021 Medical Rec #:  [id number]       Height:       59.0 in Accession #:    [id number]      Weight:       189.6 lb Date of Birth:  1951/06/20       BSA:          1.803 m Patient Age:    71 years        BP:           155/77 mmHg Patient Gender: F               HR:           98 bpm. Exam Location:  [facility] Procedure: 2D Echo, Cardiac Doppler and Color Doppler Indications:    CHF-Acute Systolic  History:        Patient has prior history of Echocardiogram examinations, most                 recent 10/03/2019. CHF; Risk Factors:Hypertension. ARDS-COVID.  Sonographer:    Wenda Low Referring Phys: [id number] JAN A [place]  1. Left ventricular ejection fraction, by estimation, is 60 to 65%. The left ventricle has normal function. The left ventricle has no regional wall motion abnormalities. Left ventricular diastolic parameters are consistent with Grade I diastolic dysfunction (impaired relaxation).  2. Right ventricular systolic function is normal. The right ventricular size is normal. Tricuspid regurgitation signal is inadequate for assessing PA pressure.  3. The mitral valve is normal in structure. Mild mitral valve regurgitation. No evidence of mitral stenosis.  4. The aortic valve is tricuspid. There is mild calcification of the aortic valve. There is mild thickening of the aortic valve. Aortic valve regurgitation is not visualized. No aortic stenosis is present.  5. The inferior vena cava is normal in size with greater than 50% respiratory variability, suggesting right atrial pressure of 3 mmHg. FINDINGS  Left Ventricle: Left ventricular ejection fraction, by estimation, is 60 to 65%. The left ventricle has normal function. The left ventricle has no regional wall motion abnormalities. The left ventricular internal cavity size was normal in size. There is  no left ventricular hypertrophy. Left ventricular diastolic  parameters are consistent with Grade I diastolic dysfunction (impaired relaxation). Normal left ventricular filling pressure. Right Ventricle: The right ventricular size is normal. No increase in right ventricular wall thickness. Right ventricular systolic function is normal. Tricuspid regurgitation signal is inadequate for assessing PA pressure. Left Atrium: Left atrial size was normal in size. Right Atrium: Right atrial size was normal in size. Pericardium: There is no evidence of pericardial effusion. Mitral Valve: The mitral valve is normal in structure. Mild mitral valve regurgitation. No evidence of mitral valve stenosis. MV peak gradient, 3.1 mmHg. The mean mitral valve gradient is 1.0 mmHg. Tricuspid Valve: The tricuspid valve is normal in structure. Tricuspid valve regurgitation is not demonstrated. No evidence of tricuspid stenosis. Aortic Valve: The aortic valve is tricuspid. There is mild calcification of the aortic valve. There is mild thickening of the aortic valve. There is mild aortic valve annular calcification. Aortic valve regurgitation is  not visualized. No aortic stenosis  is present. Aortic valve mean gradient measures 6.0 mmHg. Aortic valve peak gradient measures 10.5 mmHg. Aortic valve area, by VTI measures 2.31 cm. Pulmonic Valve: The pulmonic valve was not well visualized. Pulmonic valve regurgitation is not visualized. No evidence of pulmonic stenosis. Aorta: The aortic root is normal in size and structure. Venous: The inferior vena cava is normal in size with greater than 50% respiratory variability, suggesting right atrial pressure of 3 mmHg. IAS/Shunts: No atrial level shunt detected by color flow Doppler.  LEFT VENTRICLE PLAX 2D LVIDd:         4.37 cm  Diastology LVIDs:         2.81 cm  LV e' medial:    5.96 cm/s LV PW:         0.92 cm  LV E/e' medial:  10.2 LV IVS:        1.08 cm  LV e' lateral:   6.31 cm/s LVOT diam:     2.10 cm  LV E/e' lateral: 9.6 LV SV:         73 LV SV Index:    41 LVOT Area:     3.46 cm  RIGHT VENTRICLE RV Basal diam:  2.87 cm RV Mid diam:    2.41 cm RV S prime:     14.10 cm/s TAPSE (M-mode): 1.9 cm LEFT ATRIUM             Index       RIGHT ATRIUM           Index LA diam:        3.70 cm 2.05 cm/m  RA Area:     11.90 cm LA Vol (A2C):   48.2 ml 26.73 ml/m RA Volume:   26.00 ml  14.42 ml/m LA Vol (A4C):   35.7 ml 19.80 ml/m LA Biplane Vol: 41.5 ml 23.02 ml/m  AORTIC VALVE AV Area (Vmax):    2.39 cm AV Area (Vmean):   2.38 cm AV Area (VTI):     2.31 cm AV Vmax:           162.00 cm/s AV Vmean:          108.000 cm/s AV VTI:            0.317 m AV Peak Grad:      10.5 mmHg AV Mean Grad:      6.0 mmHg LVOT Vmax:         112.00 cm/s LVOT Vmean:        74.300 cm/s LVOT VTI:          0.211 m LVOT/AV VTI ratio: 0.67  AORTA Ao Root diam: 3.00 cm Ao Asc diam:  3.20 cm MITRAL VALVE MV Area (PHT): 3.31 cm    SHUNTS MV Area VTI:   3.90 cm    Systemic VTI:  0.21 m MV Peak grad:  3.1 mmHg    Systemic Diam: 2.10 cm MV Mean grad:  1.0 mmHg MV Vmax:       0.88 m/s MV Vmean:      50.3 cm/s MV Decel Time: 229 msec MV E velocity: 60.85 cm/s MV A velocity: 89.10 cm/s MV E/A ratio:  0.68 Kristina Dolly MD Electronically signed by Kristina Dolly MD Signature Date/Time: 04/06/2021/1:46:20 PM    Final     Micro Results   Recent Results (from the past 240 hour(s))  Resp Panel by RT-PCR (Flu A&B, Covid) Nasopharyngeal Swab     Status: None  Collection Time: 04/05/21  7:26 PM   Specimen: Nasopharyngeal Swab; Nasopharyngeal(NP) swabs in vial transport medium  Result Value Ref Range Status   SARS Coronavirus 2 by RT PCR NEGATIVE NEGATIVE Final    Comment: (NOTE) SARS-CoV-2 target nucleic acids are NOT DETECTED.  The SARS-CoV-2 RNA is generally detectable in upper respiratory specimens during the acute phase of infection. The lowest concentration of SARS-CoV-2 viral copies this assay can detect is 138 copies/mL. A negative result does not preclude SARS-Cov-2 infection and should  not be used as the sole basis for treatment or other patient management decisions. A negative result may occur with  improper specimen collection/handling, submission of specimen other than nasopharyngeal swab, presence of viral mutation(s) within the areas targeted by this assay, and inadequate number of viral copies(<138 copies/mL). A negative result must be combined with clinical observations, patient history, and epidemiological information. The expected result is Negative.  Fact Sheet for Patients:  EntrepreneurPulse.com.au  Fact Sheet for Healthcare Providers:  IncredibleEmployment.be  This test is no t yet approved or cleared by the [place] FDA and  has been authorized for detection and/or diagnosis of SARS-CoV-2 by FDA under an Emergency Use Authorization (EUA). This EUA will remain  in effect (meaning this test can be used) for the duration of the COVID-19 declaration under Section 564(b)(1) of the Act, 21 U.S.C.section 360bbb-3(b)(1), unless the authorization is terminated  or revoked sooner.       Influenza A by PCR NEGATIVE NEGATIVE Final   Influenza B by PCR NEGATIVE NEGATIVE Final    Comment: (NOTE) The Xpert Xpress SARS-CoV-2/FLU/RSV plus assay is intended as an aid in the diagnosis of influenza from Nasopharyngeal swab specimens and should not be used as a sole basis for treatment. Nasal washings and aspirates are unacceptable for Xpert Xpress SARS-CoV-2/FLU/RSV testing.  Fact Sheet for Patients: EntrepreneurPulse.com.au  Fact Sheet for Healthcare Providers: IncredibleEmployment.be  This test is not yet approved or cleared by the [place] FDA and has been authorized for detection and/or diagnosis of SARS-CoV-2 by FDA under an Emergency Use Authorization (EUA). This EUA will remain in effect (meaning this test can be used) for the duration of the COVID-19 declaration under  Section 564(b)(1) of the Act, 21 U.S.C. section 360bbb-3(b)(1), unless the authorization is terminated or revoked.  Performed at [facility], [address], [place], [place] [address]   MRSA Next Gen by PCR, Nasal     Status: None   Collection Time: 04/06/21  3:07 AM   Specimen: Nasal Mucosa; Nasal Swab  Result Value Ref Range Status   MRSA by PCR Next Gen NOT DETECTED NOT DETECTED Final    Comment: (NOTE) The GeneXpert MRSA Assay (FDA approved for NASAL specimens only), is one component of a comprehensive MRSA colonization surveillance program. It is not intended to diagnose MRSA infection nor to guide or monitor treatment for MRSA infections. Test performance is not FDA approved in patients less than 102 years old. Performed at [facility], [address] [place], [place] [address]    Today   Subjective    Kristina [name] today has no new complaints -Shortness of breath and dyspnea on exertion improved significantly -With activity requires 2 L of oxygen via nasal cannula -No chest pains dizziness or palpitation          Patient has been seen and examined prior to discharge   Objective   Blood pressure (!) 111/43, pulse 91, temperature 98.3 F (36.8 C), temperature source Oral, resp. rate  20, height 4\' 11"  (1.499 m), weight 88 kg, SpO2 95 %.   Intake/Output Summary (Last 24 hours) at 04/09/2021 1438 Last data filed at 04/09/2021 1300 Gross per 24 hour  Intake 720 ml  Output 1550 ml  Net -830 ml    Exam Gen:- Awake Alert, no acute distress , speaking in complete sentences HEENT:- [place].AT, No sclera icterus Neck-Supple Neck,No JVD,.  Lungs-improved air movement, no rales  CV- S1, S2 normal, regular Abd-  +ve B.Sounds, Abd Soft, No tenderness,    Extremity/Skin:- No significant edema,   good pulses Psych-affect is appropriate, oriented x3 Neuro-no new focal deficits, no tremors    Data Review   CBC w Diff:  Lab Results  Component Value Date   WBC 7.7 04/09/2021    HGB 14.1 04/09/2021   HCT 48.8 (H) 04/09/2021   PLT 186 04/09/2021   LYMPHOPCT 19 04/05/2021   MONOPCT 8 04/05/2021   EOSPCT 2 04/05/2021   BASOPCT 1 04/05/2021    CMP:  Lab Results  Component Value Date   NA 138 04/09/2021   K 3.8 04/09/2021   CL 92 (L) 04/09/2021   CO2 38 (H) 04/09/2021   BUN 33 (H) 04/09/2021   CREATININE 0.68 04/09/2021   PROT 5.7 (L) 10/11/2019   ALBUMIN 2.8 (L) 10/11/2019   BILITOT 0.6 10/11/2019   ALKPHOS 48 10/11/2019   AST 26 10/11/2019   ALT 39 10/11/2019  .   Total Discharge time is about 33 minutes  Roxan Hockey M.D on 04/09/2021 at 2:38 PM  Go to [email or web address] -  for contact info  [facility] - Office  [phone]

## 2021-04-11 DIAGNOSIS — R0902 Hypoxemia: Secondary | ICD-10-CM | POA: Diagnosis not present

## 2021-04-29 DIAGNOSIS — M25551 Pain in right hip: Secondary | ICD-10-CM | POA: Diagnosis not present

## 2021-04-29 DIAGNOSIS — M9905 Segmental and somatic dysfunction of pelvic region: Secondary | ICD-10-CM | POA: Diagnosis not present

## 2021-04-29 DIAGNOSIS — M9903 Segmental and somatic dysfunction of lumbar region: Secondary | ICD-10-CM | POA: Diagnosis not present

## 2021-04-29 DIAGNOSIS — M9901 Segmental and somatic dysfunction of cervical region: Secondary | ICD-10-CM | POA: Diagnosis not present

## 2021-04-29 DIAGNOSIS — M542 Cervicalgia: Secondary | ICD-10-CM | POA: Diagnosis not present

## 2021-04-29 DIAGNOSIS — M546 Pain in thoracic spine: Secondary | ICD-10-CM | POA: Diagnosis not present

## 2021-04-29 DIAGNOSIS — M9902 Segmental and somatic dysfunction of thoracic region: Secondary | ICD-10-CM | POA: Diagnosis not present

## 2021-05-04 ENCOUNTER — Ambulatory Visit: Payer: Medicare HMO | Admitting: Cardiology

## 2021-05-04 DIAGNOSIS — Z0001 Encounter for general adult medical examination with abnormal findings: Secondary | ICD-10-CM | POA: Diagnosis not present

## 2021-05-04 DIAGNOSIS — I10 Essential (primary) hypertension: Secondary | ICD-10-CM | POA: Diagnosis not present

## 2021-05-04 DIAGNOSIS — E1165 Type 2 diabetes mellitus with hyperglycemia: Secondary | ICD-10-CM | POA: Insufficient documentation

## 2021-05-04 DIAGNOSIS — R7303 Prediabetes: Secondary | ICD-10-CM | POA: Diagnosis not present

## 2021-05-04 DIAGNOSIS — I110 Hypertensive heart disease with heart failure: Secondary | ICD-10-CM | POA: Diagnosis not present

## 2021-05-04 DIAGNOSIS — I5022 Chronic systolic (congestive) heart failure: Secondary | ICD-10-CM | POA: Diagnosis not present

## 2021-05-06 ENCOUNTER — Ambulatory Visit: Payer: Medicare HMO | Admitting: Family Medicine

## 2021-05-12 DIAGNOSIS — R0902 Hypoxemia: Secondary | ICD-10-CM | POA: Diagnosis not present

## 2021-05-17 ENCOUNTER — Encounter: Payer: Self-pay | Admitting: Pulmonary Disease

## 2021-05-17 ENCOUNTER — Ambulatory Visit: Payer: Medicare HMO | Admitting: Pulmonary Disease

## 2021-05-17 ENCOUNTER — Ambulatory Visit (INDEPENDENT_AMBULATORY_CARE_PROVIDER_SITE_OTHER): Payer: Medicare HMO | Admitting: Pulmonary Disease

## 2021-05-17 ENCOUNTER — Other Ambulatory Visit: Payer: Self-pay

## 2021-05-17 VITALS — BP 150/90 | HR 97 | Temp 98.0°F | Ht 59.0 in | Wt 189.0 lb

## 2021-05-17 DIAGNOSIS — R0683 Snoring: Secondary | ICD-10-CM

## 2021-05-17 DIAGNOSIS — R0600 Dyspnea, unspecified: Secondary | ICD-10-CM

## 2021-05-17 DIAGNOSIS — Z8616 Personal history of COVID-19: Secondary | ICD-10-CM

## 2021-05-17 DIAGNOSIS — J986 Disorders of diaphragm: Secondary | ICD-10-CM | POA: Diagnosis not present

## 2021-05-17 DIAGNOSIS — Z6838 Body mass index (BMI) 38.0-38.9, adult: Secondary | ICD-10-CM

## 2021-05-17 DIAGNOSIS — E662 Morbid (severe) obesity with alveolar hypoventilation: Secondary | ICD-10-CM | POA: Diagnosis not present

## 2021-05-17 DIAGNOSIS — R0609 Other forms of dyspnea: Secondary | ICD-10-CM

## 2021-05-17 DIAGNOSIS — I5042 Chronic combined systolic (congestive) and diastolic (congestive) heart failure: Secondary | ICD-10-CM

## 2021-05-17 NOTE — Progress Notes (Signed)
[name]  Chief Complaint  Patient presents with   Shortness of Breath    With nocturnal hypoxemia and snoring     Constitutional:  BP (!) 150/90   Pulse 97   Temp 98 F (36.7 C)   Ht '4\' 11"'$  (1.499 m)   Wt 189 lb (85.7 kg)   SpO2 93% Comment: RA at rest  BMI 38.17 kg/m   Past Medical History:  Hypertension, Systolic/Diastolic CHF, DM type 2, COVID 19 PNA with ARDS January 2021, GERD  Past Surgical History:  She  has a past surgical history that includes No past surgeries and Colonoscopy (N/A, 12/21/2016).  Brief Summary:  Kristina [name] is a 70 y.o. female with dyspnea on exertion, snoring, and nocturnal hypoxemia.      Subjective:   She was in hospital in July for hypoxia from pulmonary edema secondary to diastolic CHF.  There was concern she could have sleep apnea and she was advised to schedule appointment for further assessment.  She was in hospital in January 2021 with ARDS from [place] 19 pneumonia.  Chest xray from April 08, 2021 showed CHF pattern and elevated Lt diaphragm.  Her SpO2 walking today on room air went down to 85%.  She snores and will wake up feeling choked.  She is tired during the day, and can fall asleep when watching TV.  She goes to sleep at 11 pm.  She falls asleep after about 1 hour.  She wakes up several times to use the bathroom.  She gets out of bed at 9 am.  She feels tired in the morning.  She denies morning headache.  She does not use anything to help her fall sleep or stay awake.  She denies sleep walking, sleep talking, bruxism, or nightmares.  There is no history of restless legs.  She denies sleep hallucinations, sleep paralysis, or cataplexy.  The Epworth score is 6 out of 24.  She never smoked cigarettes, but her mother did.  No history of asthma.  She did not have pneumonia prior to having COVID.  She has been wearing 2 liters oxygen intermittently with activity and at night.  Not having  cough, wheeze, sputum, or chest pain.  Swelling better since she was in hospital in July.  Physical Exam:   Appearance - well kempt   ENMT - no sinus tenderness, no oral exudate, no LAN, Mallampati 4 airway, no stridor  Respiratory - equal breath sounds bilaterally, no wheezing or rales  CV - s1s2 regular rate and rhythm, no murmurs  Ext - no clubbing, no edema  Skin - no rashes  Psych - normal mood and affect   Pulmonary testing:  ABG on 50% FiO2 04/06/21 >> pH 7.34, PCO2 76.6, PO2 69.9  Chest Imaging:  CT angio chest 04/05/21 >> basilar ATX, atherosclerosis  Sleep Tests:    Cardiac Tests:  Echo 04/06/21 >> EF 60 to 65%, grade 1 DD, mild MR  Social History:  She  reports that she has never smoked. She has never used smokeless tobacco. She reports that she does not drink alcohol and does not use drugs.  Family History:  Her family history includes Breast cancer in her mother; Heart attack in her father.    Discussion:  She has snoring, sleep disruption, apnea, and daytime sleepiness.  She has recent ABG showing hypoxia and hypercapnia consistent with hypoventilation.  She is obese with BMI of 38.17 and chest xray shows elevated left  hemidiaphragm, and these could contribute to hypoventilation.  Assessment/Plan:   Dyspnea on exertion with chronic hypoxic/hypercapnic respiratory failure, elevated Lt diaphragm, chronic combined CHF and history of COVID 19 pneumonia. - continue 2 liters oxygen with exertion and sleep for now - will arrange for pulmonary function test and Sniff test  Snoring with excessive daytime sleepiness. - will need to arrange for a home sleep study; advised her not to wear supplemental oxygen on the night of the study  Chronic combined CHF. - followed by Dr. Carlyle Dolly with [facility]  Obesity. - discussed how weight can impact sleep and risk for sleep disordered breathing - discussed options to assist with weight loss: combination of  diet modification, cardiovascular and strength training exercises  Cardiovascular risk. - had an extensive discussion regarding the adverse health consequences related to untreated sleep disordered breathing - specifically discussed the risks for hypertension, coronary artery disease, cardiac dysrhythmias, cerebrovascular disease, and diabetes - lifestyle modification discussed  Safe driving practices. - discussed how sleep disruption can increase risk of accidents, particularly when driving - safe driving practices were discussed  Therapies for obstructive sleep apnea. - if the sleep study shows significant sleep apnea, then various therapies for treatment were reviewed: CPAP, oral appliance, and surgical interventions  Time Spent Involved in Patient Care on Day of Examination:  63 minutes  Follow up:   Patient Instructions  Will arrange for pulmonary function test to be done in [place] office, home sleep study, and Sniff test  Follow up in 6 to 8 [place] in [place] or [place] office  Medication List:   Allergies as of 05/17/2021   No Known Allergies      Medication List        Accurate as of May 17, 2021  9:43 AM. If you have any questions, ask your nurse or doctor.          acetaminophen 325 MG tablet Commonly known as: TYLENOL Take 2 tablets (650 mg total) by mouth every 6 (six) hours as needed for mild pain (or Fever >/= 101).   albuterol (2.5 MG/3ML) 0.083% nebulizer solution Commonly known as: PROVENTIL Take 3 mLs (2.5 mg total) by nebulization every 6 (six) hours as needed for wheezing or shortness of breath.   amLODipine 10 MG tablet Commonly known as: NORVASC Take 1 tablet (10 mg total) by mouth daily.   aspirin EC 81 MG tablet Take 1 tablet (81 mg total) by mouth daily with breakfast.   carvedilol 3.125 MG tablet Commonly known as: COREG Take 1 tablet (3.125 mg total) by mouth 2 (two) times daily with a meal.   furosemide 40 MG  tablet Commonly known as: LASIX Take 1 tablet (40 mg total) by mouth daily.   losartan 100 MG tablet Commonly known as: COZAAR Take 1 tablet (100 mg total) by mouth daily.   multivitamin tablet Take 1 tablet by mouth daily.   omeprazole 40 MG capsule Commonly known as: PRILOSEC Take 1 capsule by mouth daily.   rosuvastatin 10 MG tablet Commonly known as: CRESTOR Take 10 mg by mouth daily.        Signature:  Chesley Mires, MD [place] Pager - [phone] 05/17/2021, 9:43 AM

## 2021-05-17 NOTE — Patient Instructions (Signed)
Will arrange for pulmonary function test to be done in [place] office, home sleep study, and Sniff test  Follow up in 6 to 8 [place] in [place] or [place] office

## 2021-05-18 DIAGNOSIS — K219 Gastro-esophageal reflux disease without esophagitis: Secondary | ICD-10-CM | POA: Diagnosis not present

## 2021-05-18 DIAGNOSIS — Z008 Encounter for other general examination: Secondary | ICD-10-CM | POA: Diagnosis not present

## 2021-05-18 DIAGNOSIS — E785 Hyperlipidemia, unspecified: Secondary | ICD-10-CM | POA: Diagnosis not present

## 2021-05-18 DIAGNOSIS — Z7982 Long term (current) use of aspirin: Secondary | ICD-10-CM | POA: Diagnosis not present

## 2021-05-18 DIAGNOSIS — I739 Peripheral vascular disease, unspecified: Secondary | ICD-10-CM | POA: Diagnosis not present

## 2021-05-18 DIAGNOSIS — I10 Essential (primary) hypertension: Secondary | ICD-10-CM | POA: Diagnosis not present

## 2021-05-18 DIAGNOSIS — M1990 Unspecified osteoarthritis, unspecified site: Secondary | ICD-10-CM | POA: Diagnosis not present

## 2021-05-18 DIAGNOSIS — Z6838 Body mass index (BMI) 38.0-38.9, adult: Secondary | ICD-10-CM | POA: Diagnosis not present

## 2021-05-18 DIAGNOSIS — E669 Obesity, unspecified: Secondary | ICD-10-CM | POA: Diagnosis not present

## 2021-05-18 DIAGNOSIS — R609 Edema, unspecified: Secondary | ICD-10-CM | POA: Diagnosis not present

## 2021-05-18 DIAGNOSIS — R32 Unspecified urinary incontinence: Secondary | ICD-10-CM | POA: Diagnosis not present

## 2021-05-18 NOTE — Progress Notes (Signed)
Cardiology Office Note  Date: 05/19/2021   ID: Kristina [name] [birth year], MRN [id number]  PCP:  Kristina Squibb, MD  Cardiologist:  Kristina Dolly, MD Electrophysiologist:  None   Chief Complaint: [name] follow-up  History of Present Illness: RENESSA [name] is a 70 y.o. female with a history of chronic combined systolic diastolic heart failure, DM2, GERD, HLD, HTN, acute respiratory distress syndrome due to COVID 19 virus.  Recent admission on 04/05/2021 for acute on chronic heart failure with preserved ejection fraction, acute respiratory failure with hypoxia, hypertension, uncontrolled diabetes.  Kristina presented to the emergency room with acute onset of dyspnea associated with Kristina [name] and DOE with minimal lower extremity edema.  Blood pressure was elevated on arrival.  Kristina was given 20 mg of IV Lasix in the emergency room and admitted to telemetry for further evaluation.  Repeat echo in [name] showed EF 60 to 65%.   Kristina was ruled out for ACS by EKG and serial cardiac exam.  Cardiology was consulted.  Hypoxia improved with 2 L nasal cannula O2.  Condition improved with diuresis.  Kristina did well with BiPAP in the [name].  Outpatient sleep study was advised.  Weight loss encouraged. HCTZ was stopped prior to discharge and Kristina was started on Lasix instead.  Kristina is here today for [name] follow-up.  Kristina states Kristina is feeling much better.  Blood pressure has improved but still slightly elevated.  At 138/86.  Heart rate is 80.  Kristina denies any anginal or exertional symptoms, palpitations or arrhythmias, orthostatic symptoms, DOE or [name].  No lower extremity edema, no weight gain.  Kristina recently saw Dr. Halford [name] who has scheduled her for a sleep study and a sniff test for her statement.  Denies any CVA or TIA-like symptoms.  No orthostatic symptoms.  No claudication-like symptoms, DVT or PE-like symptoms.  Current cardiac regimen includes amlodipine 10 mg daily, aspirin 81 mg daily, carvedilol 3.125  mg p.o. twice daily, furosemide 40 mg daily, losartan 100 mg daily, Crestor 10 mg daily.  Sleep study advised. Noticed follow up with Kristina [name].   Past Medical History:  Diagnosis Date   Acute respiratory distress syndrome (ARDS) due to COVID-19 virus ([place]) 09/2019   Chronic combined systolic and diastolic CHF (congestive heart failure) (HCC)    Diabetes mellitus, type 2 (HCC)    GERD (gastroesophageal reflux disease)    Hyperlipidemia    Hypertension     Past Surgical History:  Procedure Laterality Date   COLONOSCOPY N/A 12/21/2016   Procedure: COLONOSCOPY;  Surgeon: Kristina Houston, MD;  Location: [facility];  Service: Endoscopy;  Laterality: N/A;  930   NO PAST SURGERIES      Current Outpatient Medications  Medication Sig Dispense Refill   acetaminophen (TYLENOL) 325 MG tablet Take 2 tablets (650 mg total) by mouth every 6 (six) hours as needed for mild pain (or Fever >/= 101). 12 tablet 0   albuterol (PROVENTIL) (2.5 MG/3ML) 0.083% nebulizer solution Take 3 mLs (2.5 mg total) by nebulization every 6 (six) hours as needed for wheezing or shortness of breath. 75 mL 12   amLODipine (NORVASC) 10 MG tablet Take 1 tablet (10 mg total) by mouth daily. 30 tablet 3   aspirin EC 81 MG tablet Take 1 tablet (81 mg total) by mouth daily with breakfast. 30 tablet 11   carvedilol (COREG) 3.125 MG tablet Take 1 tablet (3.125 mg total) by mouth 2 (two) times daily with a meal. 60 tablet  5   furosemide (LASIX) 40 MG tablet Take 1 tablet (40 mg total) by mouth daily. 30 tablet 2   losartan (COZAAR) 100 MG tablet Take 1 tablet (100 mg total) by mouth daily. 30 tablet 5   Multiple Vitamin (MULTIVITAMIN) tablet Take 1 tablet by mouth daily.     omeprazole (PRILOSEC) 40 MG capsule Take 1 capsule by mouth daily.     rosuvastatin (CRESTOR) 10 MG tablet Take 10 mg by mouth daily.     No current facility-administered medications for this visit.   Allergies:  Patient has no known allergies.   Social  History: The patient  reports that Kristina has never smoked. Kristina has never used smokeless tobacco. Kristina reports that Kristina does not drink alcohol and does not use drugs.   Family History: The patient's family history includes Breast cancer in her mother; Heart attack in her father.   ROS:  Please see the history of present illness. Otherwise, complete review of systems is positive for none.  All other systems are reviewed and negative.   Physical Exam: VS:  BP 138/86   Pulse 80   Ht '4\' 9"'$  (1.448 m)   Wt 189 lb 9.6 oz (86 kg)   SpO2 93%   BMI 41.03 kg/m , BMI Body mass index is 41.03 kg/m.  Wt Readings from Last 3 Encounters:  05/19/21 189 lb 9.6 oz (86 kg)  05/17/21 189 lb (85.7 kg)  05/17/21 189 lb (85.7 kg)    General: Patient appears comfortable at rest. Neck: Supple, no elevated JVP or carotid bruits, no thyromegaly. Lungs: Clear to auscultation, nonlabored breathing at rest. Cardiac: Regular rate and rhythm, no S3 or significant systolic murmur, no pericardial rub. Extremities: No pitting edema, distal pulses 2+. Skin: Warm and dry. Musculoskeletal: No kyphosis. Neuropsychiatric: Alert and oriented x3, affect grossly appropriate.  ECG:  EKG at [name], ED on 04/05/2021 sinus tachycardia with a rate of 102.  Recent Labwork: 04/08/2021: B Natriuretic Peptide 10.0; Magnesium 2.4 04/09/2021: BUN 33; Creatinine, Ser 0.68; Hemoglobin 14.1; Platelets 186; Potassium 3.8; Sodium 138     Component Value Date/Time   TRIG 427 (H) 10/06/2019 0440    Other Studies Reviewed Today:  Echocardiogram 04/06/2021 1. Left ventricular ejection fraction, by estimation, is 60 to 65%. The left ventricle has normal function. The left ventricle has no regional wall motion abnormalities. Left ventricular diastolic parameters are consistent with Grade I diastolic dysfunction (impaired relaxation). 2. Right ventricular systolic function is normal. The right ventricular size is normal.  Tricuspid regurgitation signal is inadequate for assessing PA pressure. 3. The mitral valve is normal in structure. Mild mitral valve regurgitation. No evidence of mitral stenosis. 4. The aortic valve is tricuspid. There is mild calcification of the aortic valve. There is mild thickening of the aortic valve. Aortic valve regurgitation is not visualized. No aortic stenosis is present. 5. The inferior vena cava is normal in size with greater than 50% respiratory variability, suggesting right atrial pressure of 3 mmHg.  Echocardiogram 10/03/2019 1. Left ventricular ejection fraction, by visual estimation, is 45 to 50%. The left ventricle has mildly decreased function. There is mildly increased left ventricular hypertrophy. 2. The left ventricle demonstrates global hypokinesis. 3. Left ventricular diastolic parameters are indeterminate. 4. Global right ventricle has mildly reduced systolic function.The right ventricular size is normal. 5. Left atrial size was normal. 6. Right atrial size was normal. 7. The mitral valve is normal in structure. Mild mitral valve regurgitation. 8. The tricuspid valve  is grossly normal. 9. The aortic valve is tricuspid. Aortic valve regurgitation is not visualized. No evidence of aortic valve stenosis. 10. The pulmonic valve was not well visualized. Pulmonic valve regurgitation is trivial. 11. TR signal is inadequate for assessing pulmonary artery systolic pressure.     Assessment and Plan:  1. Acute on chronic heart failure with preserved ejection fraction (HFpEF) /Acute on Chronic Diastolic CHF   2. Acute respiratory failure with hypoxia and hypercapnia (HCC)   3. Essential hypertension   4. Uncontrolled type 2 diabetes mellitus with hyperglycemia ([place])   5. Suspected sleep apnea    1. Acute on chronic heart failure with preserved ejection fraction (HFpEF) /Acute on Chronic Diastolic CHF Patient states Kristina is feeling much better since discharge from  [name].  Kristina [name].  Kristina [name], Kristina [name], Kristina [name].  Denies any lower extremity edema.  Echocardiogram during [name] stay demonstrated EF of 60 to 65% no WMA's.  G1 DD.  Mild MR.  Continue furosemide 40 mg daily.  We are increasing carvedilol to 6.25 mg p.o. twice daily.  Continue losartan 100 mg daily.  2. Acute respiratory failure with hypoxia and hypercapnia (HCC) Kristina is currently experiencing no DOE or [name].  Kristina is using home O2.  Kristina recently saw Dr. Halford [name] who is scheduling her for sleep study and a sniff test per her statement.    3. Essential hypertension Blood pressure elevated today at 138/86.  We are increasing carvedilol to 6.25 mg p.o. twice daily.  Continue losartan 100 mg daily.  Continue furosemide 40 mg daily.  Continue amlodipine 10 mg daily.  Advised her to call in 2 [place] and update [place] on blood pressures since increasing carvedilol.  4. Uncontrolled type 2 diabetes mellitus with hyperglycemia ([place]) Last random glucose on 04/09/2021 119.  Will follow with PCP for diabetes.  5. Suspected sleep apnea Recent suspicion of sleep apnea and was referred to Dr. Halford [name].  Kristina has an upcoming sleep study and sniff test.  Medication Adjustments/Labs and Tests Ordered: Current medicines are reviewed at length with the patient today.  Concerns regarding medicines are outlined above.   Disposition: Follow-up with Dr. Harl Bowie or APP 6 months  Signed, Levell July, NP 05/19/2021 8:59 AM    [facility] [place], [place], [place] [address] Phone: [phone]; Fax: [phone]

## 2021-05-18 NOTE — Progress Notes (Signed)
Ambulatory oximetry.

## 2021-05-19 ENCOUNTER — Encounter: Payer: Self-pay | Admitting: Family Medicine

## 2021-05-19 ENCOUNTER — Ambulatory Visit: Payer: Medicare HMO | Admitting: Family Medicine

## 2021-05-19 VITALS — BP 138/86 | HR 80 | Ht <= 58 in | Wt 189.6 lb

## 2021-05-19 DIAGNOSIS — I10 Essential (primary) hypertension: Secondary | ICD-10-CM | POA: Diagnosis not present

## 2021-05-19 DIAGNOSIS — J9601 Acute respiratory failure with hypoxia: Secondary | ICD-10-CM

## 2021-05-19 DIAGNOSIS — R29818 Other symptoms and signs involving the nervous system: Secondary | ICD-10-CM

## 2021-05-19 DIAGNOSIS — E1165 Type 2 diabetes mellitus with hyperglycemia: Secondary | ICD-10-CM

## 2021-05-19 DIAGNOSIS — J9602 Acute respiratory failure with hypercapnia: Secondary | ICD-10-CM | POA: Diagnosis not present

## 2021-05-19 DIAGNOSIS — I5033 Acute on chronic diastolic (congestive) heart failure: Secondary | ICD-10-CM | POA: Diagnosis not present

## 2021-05-19 MED ORDER — CARVEDILOL 6.25 MG PO TABS: 6.2500 mg | ORAL_TABLET | Freq: Two times a day (BID) | ORAL | 3 refills | Status: DC

## 2021-05-19 NOTE — Patient Instructions (Addendum)
Medication Instructions:  Increase your Coreg to 6.'25mg'$  twice a day. Continue all other medications.     Labwork: none  Testing/Procedures: none  Follow-Up: 6 months  Any Other Special Instructions Will Be Listed Below (If Applicable).  Please call the office in 2 [place] with update on blood pressure and heart rate & how feeling.   If you need a refill on your cardiac medications before your next appointment, please call your pharmacy.

## 2021-05-24 ENCOUNTER — Ambulatory Visit (HOSPITAL_COMMUNITY): Payer: Medicare HMO

## 2021-05-26 ENCOUNTER — Ambulatory Visit (HOSPITAL_COMMUNITY)
Admission: RE | Admit: 2021-05-26 | Discharge: 2021-05-26 | Disposition: A | Discharge status: Home / Self Care | Payer: Medicare HMO | Source: Ambulatory Visit | Attending: Pulmonary Disease | Admitting: Pulmonary Disease

## 2021-05-26 ENCOUNTER — Other Ambulatory Visit: Payer: Self-pay

## 2021-05-26 DIAGNOSIS — R0609 Other forms of dyspnea: Secondary | ICD-10-CM

## 2021-05-26 DIAGNOSIS — J986 Disorders of diaphragm: Secondary | ICD-10-CM | POA: Diagnosis not present

## 2021-05-26 DIAGNOSIS — R0600 Dyspnea, unspecified: Secondary | ICD-10-CM

## 2021-05-26 DIAGNOSIS — Z9911 Dependence on respirator [ventilator] status: Secondary | ICD-10-CM | POA: Diagnosis not present

## 2021-05-27 DIAGNOSIS — M9905 Segmental and somatic dysfunction of pelvic region: Secondary | ICD-10-CM | POA: Diagnosis not present

## 2021-05-27 DIAGNOSIS — M9903 Segmental and somatic dysfunction of lumbar region: Secondary | ICD-10-CM | POA: Diagnosis not present

## 2021-05-27 DIAGNOSIS — M25551 Pain in right hip: Secondary | ICD-10-CM | POA: Diagnosis not present

## 2021-05-27 DIAGNOSIS — M542 Cervicalgia: Secondary | ICD-10-CM | POA: Diagnosis not present

## 2021-05-27 DIAGNOSIS — M9901 Segmental and somatic dysfunction of cervical region: Secondary | ICD-10-CM | POA: Diagnosis not present

## 2021-05-27 DIAGNOSIS — M546 Pain in thoracic spine: Secondary | ICD-10-CM | POA: Diagnosis not present

## 2021-05-27 DIAGNOSIS — M9902 Segmental and somatic dysfunction of thoracic region: Secondary | ICD-10-CM | POA: Diagnosis not present

## 2021-06-12 DIAGNOSIS — R0902 Hypoxemia: Secondary | ICD-10-CM | POA: Diagnosis not present

## 2021-06-22 ENCOUNTER — Ambulatory Visit (INDEPENDENT_AMBULATORY_CARE_PROVIDER_SITE_OTHER): Payer: Medicare HMO | Admitting: Pulmonary Disease

## 2021-06-22 ENCOUNTER — Encounter: Payer: Self-pay | Admitting: Pulmonary Disease

## 2021-06-22 ENCOUNTER — Other Ambulatory Visit: Payer: Self-pay

## 2021-06-22 VITALS — BP 150/74 | HR 91 | Temp 97.8°F | Ht <= 58 in | Wt 188.0 lb

## 2021-06-22 DIAGNOSIS — E662 Morbid (severe) obesity with alveolar hypoventilation: Secondary | ICD-10-CM | POA: Diagnosis not present

## 2021-06-22 DIAGNOSIS — R0609 Other forms of dyspnea: Secondary | ICD-10-CM

## 2021-06-22 DIAGNOSIS — J986 Disorders of diaphragm: Secondary | ICD-10-CM

## 2021-06-22 DIAGNOSIS — R0683 Snoring: Secondary | ICD-10-CM | POA: Diagnosis not present

## 2021-06-22 DIAGNOSIS — R0600 Dyspnea, unspecified: Secondary | ICD-10-CM

## 2021-06-22 LAB — PULMONARY FUNCTION TEST
DL/VA % pred: 146 %
DL/VA: 6.37 ml/min/mmHg/L
DLCO cor % pred: 105 %
DLCO cor: 16.69 ml/min/mmHg
DLCO unc % pred: 105 %
DLCO unc: 16.69 ml/min/mmHg
FEF 25-75 Post: 1.35 L/sec
FEF 25-75 Pre: 1.09 L/sec
FEF2575-%Change-Post: 23 %
FEF2575-%Pred-Post: 83 %
FEF2575-%Pred-Pre: 67 %
FEV1-%Change-Post: 8 %
FEV1-%Pred-Post: 71 %
FEV1-%Pred-Pre: 65 %
FEV1-Post: 1.25 L
FEV1-Pre: 1.15 L
FEV1FVC-%Change-Post: -1 %
FEV1FVC-%Pred-Pre: 106 %
FEV6-%Change-Post: 6 %
FEV6-%Pred-Post: 68 %
FEV6-%Pred-Pre: 64 %
FEV6-Post: 1.52 L
FEV6-Pre: 1.42 L
FEV6FVC-%Pred-Post: 105 %
FEV6FVC-%Pred-Pre: 105 %
FVC-%Change-Post: 10 %
FVC-%Pred-Post: 67 %
FVC-%Pred-Pre: 61 %
FVC-Post: 1.57 L
FVC-Pre: 1.42 L
Post FEV1/FVC ratio: 80 %
Post FEV6/FVC ratio: 100 %
Pre FEV1/FVC ratio: 81 %
Pre FEV6/FVC Ratio: 100 %
RV % pred: 52 %
RV: 0.98 L
TLC % pred: 62 %
TLC: 2.61 L

## 2021-06-22 NOTE — Patient Instructions (Signed)
Full PFT performed today. °

## 2021-06-22 NOTE — Progress Notes (Signed)
[facility] Sleep [facility]  Chief Complaint  Patient presents with   Follow-up    PFT today-sob same     Constitutional:  BP (!) 150/74 (BP Location: Left Arm, Cuff Size: Large)   Pulse 91   Temp 97.8 F (36.6 C) (Temporal)   Ht 4\' 10"  (1.473 m)   Wt 188 lb (85.3 kg)   SpO2 90%   BMI 39.29 kg/m   Past Medical History:  Hypertension, Systolic/Diastolic CHF, DM type 2, COVID 19 PNA with ARDS January 2021, GERD  Past Surgical History:  She  has a past surgical history that includes No past surgeries and Colonoscopy (N/A, 12/21/2016).  Brief Summary:  Kristina [name] is a 70 y.o. female with dyspnea on exertion, snoring, and nocturnal hypoxemia.      Subjective:   Her PFT today showed mild restriction.  Sniff test showed Lt diaphragm does have movement.  She started exercising several days a week.  Hasn't been able to focus on her diet just yet.    Not having cough, wheeze, or sputum.  Not having leg swelling.  Physical Exam:   Appearance - well kempt   ENMT - no sinus tenderness, no oral exudate, no LAN, Mallampati 4 airway, no stridor  Respiratory - equal breath sounds bilaterally, no wheezing or rales  CV - s1s2 regular rate and rhythm, no murmurs  Ext - no clubbing, no edema  Skin - no rashes  Psych - normal mood and affect    Pulmonary testing:  ABG on 50% FiO2 04/06/21 >> pH 7.34, PCO2 76.6, PO2 69.9 PFT 06/22/21 >> FEV1 1.25 (71%), FEV1% 80, TLC 2.61 (62%), DLCO 105%  Chest Imaging:  CT angio chest 04/05/21 >> basilar ATX, atherosclerosis Sniff test 05/26/21 >> Mildly elevated LEFT diaphragm does demonstrate normal descent with sniffing, though lesser degree of descent and is demonstrated by the RIGHT diaphragm.  No paradoxical LEFT diaphragmatic motion is identified to suggest diaphragmatic paralysis.  Sleep Tests:    Cardiac Tests:  Echo 04/06/21 >> EF 60 to 65%, grade 1 DD, mild MR  Social History:  She  reports  that she has never smoked. She has never used smokeless tobacco. She reports that she does not drink alcohol and does not use drugs.  Family History:  Her family history includes Breast cancer in her mother; Heart attack in her father.     Assessment/Plan:   Dyspnea on exertion. - related to combined CHF, and obesity with deconditioning causing restrictive lung defect - encouraged her to maintain a regular exercise regimen  Chronic hypoxic/hypercapnic respiratory failure from obesity hypoventilation syndrome. - 2 liters oxygen with exertion and sleep, pending sleep study results  Snoring with excessive daytime sleepiness. - will check on status of home sleep study; advised her not to wear supplemental oxygen on the night of the study  Chronic combined CHF. - followed by Dr. Carlyle Dolly with [facility]  Obesity. - discussed how weight can impact sleep and risk for sleep disordered breathing - discussed options to assist with weight loss: combination of diet modification, cardiovascular and strength training exercises  Time Spent Involved in Patient Care on Day of Examination:  33 minutes  Follow up:   Patient Instructions  Will check the status of when your home sleep study will be schedule  Will arrange for follow up in [place] office after review of your home sleep study  Medication List:   Allergies as of 06/22/2021   No  Known Allergies      Medication List        Accurate as of June 22, 2021 12:11 PM. If you have any questions, ask your nurse or doctor.          acetaminophen 325 MG tablet Commonly known as: TYLENOL Take 2 tablets (650 mg total) by mouth every 6 (six) hours as needed for mild pain (or Fever >/= 101).   albuterol (2.5 MG/3ML) 0.083% nebulizer solution Commonly known as: PROVENTIL Take 3 mLs (2.5 mg total) by nebulization every 6 (six) hours as needed for wheezing or shortness of breath.   amLODipine 10 MG tablet Commonly  known as: NORVASC Take 1 tablet (10 mg total) by mouth daily.   aspirin EC 81 MG tablet Take 1 tablet (81 mg total) by mouth daily with breakfast.   carvedilol 6.25 MG tablet Commonly known as: COREG Take 1 tablet (6.25 mg total) by mouth 2 (two) times daily.   furosemide 40 MG tablet Commonly known as: LASIX Take 1 tablet (40 mg total) by mouth daily.   losartan 100 MG tablet Commonly known as: COZAAR Take 1 tablet (100 mg total) by mouth daily.   multivitamin tablet Take 1 tablet by mouth daily.   omeprazole 40 MG capsule Commonly known as: PRILOSEC Take 1 capsule by mouth daily.   rosuvastatin 10 MG tablet Commonly known as: CRESTOR Take 10 mg by mouth daily.        Signature:  Chesley Mires, MD [place] Pager - [phone] 06/22/2021, 12:11 PM

## 2021-06-22 NOTE — Progress Notes (Signed)
Full PFT performed today. °

## 2021-06-22 NOTE — Patient Instructions (Signed)
Will check the status of when your home sleep study will be schedule  Will arrange for follow up in [place] office after review of your home sleep study

## 2021-07-12 DIAGNOSIS — R0902 Hypoxemia: Secondary | ICD-10-CM | POA: Diagnosis not present

## 2021-07-20 ENCOUNTER — Other Ambulatory Visit: Payer: Self-pay

## 2021-07-20 DIAGNOSIS — G4733 Obstructive sleep apnea (adult) (pediatric): Secondary | ICD-10-CM | POA: Diagnosis not present

## 2021-07-20 DIAGNOSIS — R0683 Snoring: Secondary | ICD-10-CM

## 2021-07-22 ENCOUNTER — Telehealth: Payer: Self-pay | Admitting: Pulmonary Disease

## 2021-07-22 DIAGNOSIS — M546 Pain in thoracic spine: Secondary | ICD-10-CM | POA: Diagnosis not present

## 2021-07-22 DIAGNOSIS — M9903 Segmental and somatic dysfunction of lumbar region: Secondary | ICD-10-CM | POA: Diagnosis not present

## 2021-07-22 DIAGNOSIS — M542 Cervicalgia: Secondary | ICD-10-CM | POA: Diagnosis not present

## 2021-07-22 DIAGNOSIS — M25551 Pain in right hip: Secondary | ICD-10-CM | POA: Diagnosis not present

## 2021-07-22 DIAGNOSIS — M9901 Segmental and somatic dysfunction of cervical region: Secondary | ICD-10-CM | POA: Diagnosis not present

## 2021-07-22 DIAGNOSIS — G4733 Obstructive sleep apnea (adult) (pediatric): Secondary | ICD-10-CM | POA: Diagnosis not present

## 2021-07-22 DIAGNOSIS — M9902 Segmental and somatic dysfunction of thoracic region: Secondary | ICD-10-CM | POA: Diagnosis not present

## 2021-07-22 DIAGNOSIS — M9905 Segmental and somatic dysfunction of pelvic region: Secondary | ICD-10-CM | POA: Diagnosis not present

## 2021-07-22 NOTE — Telephone Encounter (Signed)
HST 07/20/21 >> AHI 29.6, SpO2 low 37%.  Spent 601 min with SpO2 < 89%.  Please inform her that her sleep study shows moderate to severe obstructive sleep apnea with low oxygen levels.  Please arrange for ROV with me or NP.  Okay to double book visit with me if needed.  She should continue using supplemental oxygen until she has appointment.

## 2021-07-22 NOTE — Telephone Encounter (Signed)
ATC patient. Husband answered the phone and said he would tell the patient to call the office back for results.

## 2021-07-26 DIAGNOSIS — R7303 Prediabetes: Secondary | ICD-10-CM | POA: Diagnosis not present

## 2021-07-26 DIAGNOSIS — I10 Essential (primary) hypertension: Secondary | ICD-10-CM | POA: Diagnosis not present

## 2021-07-29 DIAGNOSIS — I509 Heart failure, unspecified: Secondary | ICD-10-CM | POA: Diagnosis not present

## 2021-07-29 DIAGNOSIS — E785 Hyperlipidemia, unspecified: Secondary | ICD-10-CM | POA: Diagnosis not present

## 2021-07-29 DIAGNOSIS — R7303 Prediabetes: Secondary | ICD-10-CM | POA: Diagnosis not present

## 2021-07-29 DIAGNOSIS — I10 Essential (primary) hypertension: Secondary | ICD-10-CM | POA: Diagnosis not present

## 2021-07-29 DIAGNOSIS — K219 Gastro-esophageal reflux disease without esophagitis: Secondary | ICD-10-CM | POA: Diagnosis not present

## 2021-09-05 NOTE — Telephone Encounter (Signed)
disregard

## 2021-09-05 NOTE — Telephone Encounter (Signed)
Patient called office asking for HST results. Gave pt results. Scheduled her with Geraldo Pitter in [facility] office for [place]. At 10:30 pt aware of appt being in [place]. Nothing further needed.

## 2021-09-08 ENCOUNTER — Encounter: Payer: Self-pay | Admitting: Primary Care

## 2021-09-08 ENCOUNTER — Other Ambulatory Visit: Payer: Self-pay

## 2021-09-08 ENCOUNTER — Ambulatory Visit (INDEPENDENT_AMBULATORY_CARE_PROVIDER_SITE_OTHER): Payer: Medicare HMO | Admitting: Primary Care

## 2021-09-08 VITALS — BP 122/76 | HR 86 | Temp 98.2°F | Ht <= 58 in | Wt 181.0 lb

## 2021-09-08 DIAGNOSIS — G4730 Sleep apnea, unspecified: Secondary | ICD-10-CM | POA: Insufficient documentation

## 2021-09-08 DIAGNOSIS — I5033 Acute on chronic diastolic (congestive) heart failure: Secondary | ICD-10-CM | POA: Diagnosis not present

## 2021-09-08 DIAGNOSIS — G4733 Obstructive sleep apnea (adult) (pediatric): Secondary | ICD-10-CM | POA: Insufficient documentation

## 2021-09-08 DIAGNOSIS — J9611 Chronic respiratory failure with hypoxia: Secondary | ICD-10-CM | POA: Diagnosis not present

## 2021-09-08 NOTE — Assessment & Plan Note (Signed)
-   Appear euvolemic on exam. Following with Dr. Harl Bowie with [facility] heart care

## 2021-09-08 NOTE — Assessment & Plan Note (Signed)
-  O2 87% RA p walking 250-380ft, recovered to 96% with rest. She is not consistently wearing her oxygen with activity. Advised she continue to wear 2L supplemental oxygen with moderate exertion and 3L at bedtime d/t severe oxygen desaturations seen on sleep study. Encourage she continue to participate in regular exercise.

## 2021-09-08 NOTE — Progress Notes (Signed)
@Patient  ID: Kristina [name], female    DOB: Mar 07, 1951, 70 y.o.   MRN: [id number]  Chief Complaint  Patient presents with   Follow-up    Referring provider: Celene Squibb, MD  HPI: 70 year old female, never smoked. PMH significant for snoring, HTN, HFpEF, covid pneumonia, diabetes mellitus. Patient of Dr. Halford Chessman, last seen in office on 06/22/21.  09/08/2021 Patient presents today to review sleep study results. HST on 07/20/21 showed moderate-severe OSA with severe oxygen desaturation; AHI 29.6/hr with SpO2 low 38% with an average of 75%. We reviewed treatment options including weight loss, CPAP therapy and referral to ENT for surgical options. D/t severity of sleep apnea recommended she be started on CPAP therapy. She has a fixed income and is worried about being able to afford additional testing and CPAP device. She has been on supplemental oxygen since having covid-19 pneumonia. Her O2 was 87% RA today, she is not currently wearing oxygen. [facility]    No Known Allergies  Immunization History  Administered Date(s) Administered   Moderna Sars-Covid-2 Vaccination 04/02/2020, 04/30/2020    Past Medical History:  Diagnosis Date   Acute respiratory distress syndrome (ARDS) due to COVID-19 virus ([place]) 09/2019   Chronic combined systolic and diastolic CHF (congestive heart failure) (HCC)    Diabetes mellitus, type 2 (HCC)    GERD (gastroesophageal reflux disease)    Hyperlipidemia    Hypertension     Tobacco History: Social History   Tobacco Use  Smoking Status Never  Smokeless Tobacco Never   Counseling given: Not Answered   Outpatient Medications Prior to Visit  Medication Sig Dispense Refill   acetaminophen (TYLENOL) 325 MG tablet Take 2 tablets (650 mg total) by mouth every 6 (six) hours as needed for mild pain (or Fever >/= 101). 12 tablet 0   albuterol (PROVENTIL) (2.5 MG/3ML) 0.083% nebulizer solution Take 3 mLs (2.5 mg total) by  nebulization every 6 (six) hours as needed for wheezing or shortness of breath. 75 mL 12   amLODipine (NORVASC) 10 MG tablet Take 1 tablet (10 mg total) by mouth daily. 30 tablet 3   aspirin EC 81 MG tablet Take 1 tablet (81 mg total) by mouth daily with breakfast. 30 tablet 11   carvedilol (COREG) 6.25 MG tablet Take 1 tablet (6.25 mg total) by mouth 2 (two) times daily. 180 tablet 3   furosemide (LASIX) 40 MG tablet Take 1 tablet (40 mg total) by mouth daily. 30 tablet 2   losartan (COZAAR) 100 MG tablet Take 1 tablet (100 mg total) by mouth daily. 30 tablet 5   Multiple Vitamin (MULTIVITAMIN) tablet Take 1 tablet by mouth daily.     omeprazole (PRILOSEC) 40 MG capsule Take 1 capsule by mouth daily.     rosuvastatin (CRESTOR) 10 MG tablet Take 10 mg by mouth daily.     No facility-administered medications prior to visit.   Review of Systems  Review of Systems  Constitutional: Negative.   HENT: Negative.    Respiratory: Negative.    Cardiovascular: Negative.     Physical Exam  BP 122/76 (BP Location: Right Arm, Patient Position: Sitting, Cuff Size: Normal)    Pulse 86    Temp 98.2 F (36.8 C) (Oral)    Ht 4\' 10"  (1.473 m)    Wt 181 lb (82.1 kg)    SpO2 96%    BMI 37.83 kg/m  Physical Exam Constitutional:      Appearance:  Normal appearance.  HENT:     Head: Normocephalic and atraumatic.     Mouth/Throat:     Mouth: Mucous membranes are moist.     Pharynx: Oropharynx is clear.  Cardiovascular:     Rate and Rhythm: Normal rate and regular rhythm.  Pulmonary:     Effort: Pulmonary effort is normal. No respiratory distress.     Breath sounds: Normal breath sounds. No wheezing.     Comments: Distant rales lung bases; O2 87% RA with ambulation, recovered 96% with rest  Musculoskeletal:        General: Normal range of motion.  Skin:    General: Skin is warm and dry.  Neurological:     General: No focal deficit present.     Mental Status: She is alert and oriented to person,  place, and time. Mental status is at baseline.  Psychiatric:        Mood and Affect: Mood normal.        Behavior: Behavior normal.        Thought Content: Thought content normal.        Judgment: Judgment normal.     Lab Results:  CBC    Component Value Date/Time   WBC 7.7 04/09/2021 0521   RBC 5.05 04/09/2021 0521   HGB 14.1 04/09/2021 0521   HCT 48.8 (H) 04/09/2021 0521   PLT 186 04/09/2021 0521   MCV 96.6 04/09/2021 0521   MCH 27.9 04/09/2021 0521   MCHC 28.9 (L) 04/09/2021 0521   RDW 15.5 04/09/2021 0521   LYMPHSABS 1.7 04/05/2021 1814   MONOABS 0.7 04/05/2021 1814   EOSABS 0.1 04/05/2021 1814   BASOSABS 0.1 04/05/2021 1814    BMET    Component Value Date/Time   NA 138 04/09/2021 0521   K 3.8 04/09/2021 0521   CL 92 (L) 04/09/2021 0521   CO2 38 (H) 04/09/2021 0521   GLUCOSE 112 (H) 04/09/2021 0521   BUN 33 (H) 04/09/2021 0521   CREATININE 0.68 04/09/2021 0521   CALCIUM 8.8 (L) 04/09/2021 0521   GFRNONAA >60 04/09/2021 0521   GFRAA >60 10/11/2019 0440    BNP    Component Value Date/Time   BNP 10.0 04/08/2021 0808    ProBNP No results found for: PROBNP  Imaging: No results found.   Assessment & Plan:   Sleep apnea - HST on 07/20/21 showed moderate-severe OSA with severe oxygen desaturation; AHI 29.6/hr with SpO2 low 38% with an average of 75%. D/t severity of sleep apnea recommended she be started on CPAP therapy. She is on a fixed income and worried about cost of additional testing and CPAP device. We will start her on auto CPAP 5-20cm h20. She has an oxygen concentrator at home already, advised she continue to wear nocturnal oxygen until she gets CPAP. We will then check overnight oximetry on CPAP.  If having difficulty treating OSA, consider CPAP titration study. Encourage weight loss efforts. Advised she wear CPAP every night and during naps. She may be interested in inspire device in the future if unable to tolerate CPAP. FU in 3 months or sooner if  needed.  Acute on chronic heart failure with preserved ejection fraction (HFpEF) /Acute on Chronic Diastolic CHF - Appear euvolemic on exam. Following with Dr. Harl Bowie with [facility]   Chronic respiratory failure with hypoxia ([place]) -O2 87% RA p walking 250-324ft, recovered to 96% with rest. She is not consistently wearing her oxygen with activity. Advised she continue to wear 2L supplemental  oxygen with moderate exertion and 3L at bedtime d/t severe oxygen desaturations seen on sleep study. Encourage she continue to participate in regular exercise.   40 mins spent on case > 50% face to face with patient   Martyn Ehrich, NP 09/08/2021

## 2021-09-08 NOTE — Assessment & Plan Note (Addendum)
-   HST on 07/20/21 showed moderate-severe OSA with severe oxygen desaturation; AHI 29.6/hr with SpO2 low 38% with an average of 75%. D/t severity of sleep apnea recommended she be started on CPAP therapy. She is on a fixed income and worried about cost of additional testing and CPAP device. We will start her on auto CPAP 5-20cm h20. She has an oxygen concentrator at home already, advised she continue to wear nocturnal oxygen until she gets CPAP. We will then check overnight oximetry on CPAP.  If having difficulty treating OSA, consider CPAP titration study. Encourage weight loss efforts. Advised she wear CPAP every night and during naps. She may be interested in inspire device in the future if unable to tolerate CPAP. FU in 3 months or sooner if needed.

## 2021-09-08 NOTE — Patient Instructions (Addendum)
Recommendations: - You need to wear 2L oxygen with any moderate exertion to maintain O2 >88-90% AND wear 3L oxygen at bedtime until you get CPAP  - Please call us once you get CPAP so we can set up follow-up and check overnight oximetry   Orders:  - New CPAP auto titrate 5-20cm h30, supplies, mask of choice, humidification, O2 adapter (urgent priority d/t severe OSA, sleep study was on 07/20/21)  Follow-up: 3 months with Dr. Halford Chessman or sooner if needed    CPAP and BIPAP Information CPAP and BIPAP are methods that use air pressure to keep your airways open and to help you breathe well. CPAP and BIPAP use different amounts of pressure. Your health care provider will tell you whether CPAP or BIPAP would be more helpful for you. CPAP stands for "continuous positive airway pressure." With CPAP, the amount of pressure stays the same while you breathe in (inhale) and out (exhale). BIPAP stands for "bi-level positive airway pressure." With BIPAP, the amount of pressure will be higher when you inhale and lower when you exhale. This allows you to take larger breaths. CPAP or BIPAP may be used in the hospital, or your health care provider may want you to use it at home. You may need to have a sleep study before your health care provider can order a machine for you to use at home. What are the advantages? CPAP or BIPAP can be helpful if you have: Sleep apnea. Chronic obstructive pulmonary disease (COPD). Heart failure. Medical conditions that cause muscle weakness, including muscular dystrophy or amyotrophic lateral sclerosis (ALS). Other problems that cause breathing to be shallow, weak, abnormal, or difficult. CPAP and BIPAP are most commonly used for obstructive sleep apnea (OSA) to keep the airways from collapsing when the muscles relax during sleep. What are the risks? Generally, this is a safe treatment. However, problems may occur, including: Irritated skin or skin sores if the mask does not fit  properly. Dry or stuffy nose or nosebleeds. Dry mouth. Feeling gassy or bloated. Sinus or lung infection if the equipment is not cleaned properly. When should CPAP or BIPAP be used? In most cases, the mask only needs to be worn during sleep. Generally, the mask needs to be worn throughout the night and during any daytime naps. People with certain medical conditions may also need to wear the mask at other times, such as when they are awake. Follow instructions from your health care provider about when to use the machine. What happens during CPAP or BIPAP? Both CPAP and BIPAP are provided by a small machine with a flexible plastic tube that attaches to a plastic mask that you wear. Air is blown through the mask into your nose or mouth. The amount of pressure that is used to blow the air can be adjusted on the machine. Your health care provider will set the pressure setting and help you find the best mask for you. Tips for using the mask Because the mask needs to be snug, some people feel trapped or closed-in (claustrophobic) when first using the mask. If you feel this way, you may need to get used to the mask. One way to do this is to hold the mask loosely over your nose or mouth and then gradually apply the mask more snugly. You can also gradually increase the amount of time that you use the mask. Masks are available in various types and sizes. If your mask does not fit well, talk with your health care provider  about getting a different one. Some common types of masks include: Full face masks, which fit over the mouth and nose. Nasal masks, which fit over the nose. Nasal pillow or prong masks, which fit into the nostrils. If you are using a mask that fits over your nose and you tend to breathe through your mouth, a chin strap may be applied to help keep your mouth closed. Use a skin barrier to protect your skin as told by your health care provider. Some CPAP and BIPAP machines have alarms that may  sound if the mask comes off or develops a leak. If you have trouble with the mask, it is very important that you talk with your health care provider about finding a way to make the mask easier to tolerate. Do not stop using the mask. There could be a negative impact on your health if you stop using the mask. Tips for using the machine Place your CPAP or BIPAP machine on a secure table or stand near an electrical outlet. Know where the on/off switch is on the machine. Follow instructions from your health care provider about how to set the pressure on your machine and when you should use it. Do not eat or drink while the CPAP or BIPAP machine is on. Food or fluids could get pushed into your lungs by the pressure of the CPAP or BIPAP. For home use, CPAP and BIPAP machines can be rented or purchased through home health care companies. Many different brands of machines are available. Renting a machine before purchasing may help you find out which particular machine works well for you. Your health insurance company may also decide which machine you may get. Keep the CPAP or BIPAP machine and attachments clean. Ask your health care provider for specific instructions. Check the humidifier if you have a dry stuffy nose or nosebleeds. Make sure it is working correctly. Follow these instructions at home: Take over-the-counter and prescription medicines only as told by your health care provider. Ask if you can take sinus medicine if your sinuses are blocked. Do not use any products that contain nicotine or tobacco. These products include cigarettes, chewing tobacco, and vaping devices, such as e-cigarettes. If you need help quitting, ask your health care provider. Keep all follow-up visits. This is important. Contact a health care provider if: You have redness or pressure sores on your head, face, mouth, or nose from the mask or head gear. You have trouble using the CPAP or BIPAP machine. You cannot tolerate  wearing the CPAP or BIPAP mask. Someone tells you that you snore even when wearing your CPAP or BIPAP. Get help right away if: You have trouble breathing. You feel confused. Summary CPAP and BIPAP are methods that use air pressure to keep your airways open and to help you breathe well. If you have trouble with the mask, it is very important that you talk with your health care provider about finding a way to make the mask easier to tolerate. Do not stop using the mask. There could be a negative impact to your health if you stop using the mask. Follow instructions from your health care provider about when to use the machine. This information is not intended to replace advice given to you by your health care provider. Make sure you discuss any questions you have with your health care provider. Document Revised: 04/20/2021 Document Reviewed: 08/20/2020 Elsevier Patient Education  2022 [employer].

## 2021-09-08 NOTE — Progress Notes (Signed)
Reviewed and agree with assessment/plan.   Chesley Mires, MD [facility]/Critical [facility] 09/08/2021, 11:28 AM Pager:  [phone]

## 2021-09-23 DIAGNOSIS — M9905 Segmental and somatic dysfunction of pelvic region: Secondary | ICD-10-CM | POA: Diagnosis not present

## 2021-09-23 DIAGNOSIS — M9902 Segmental and somatic dysfunction of thoracic region: Secondary | ICD-10-CM | POA: Diagnosis not present

## 2021-09-23 DIAGNOSIS — M9903 Segmental and somatic dysfunction of lumbar region: Secondary | ICD-10-CM | POA: Diagnosis not present

## 2021-09-23 DIAGNOSIS — M25551 Pain in right hip: Secondary | ICD-10-CM | POA: Diagnosis not present

## 2021-09-23 DIAGNOSIS — M542 Cervicalgia: Secondary | ICD-10-CM | POA: Diagnosis not present

## 2021-09-23 DIAGNOSIS — M546 Pain in thoracic spine: Secondary | ICD-10-CM | POA: Diagnosis not present

## 2021-09-23 DIAGNOSIS — M9901 Segmental and somatic dysfunction of cervical region: Secondary | ICD-10-CM | POA: Diagnosis not present

## 2021-11-18 DIAGNOSIS — M9903 Segmental and somatic dysfunction of lumbar region: Secondary | ICD-10-CM | POA: Diagnosis not present

## 2021-11-18 DIAGNOSIS — M25551 Pain in right hip: Secondary | ICD-10-CM | POA: Diagnosis not present

## 2021-11-18 DIAGNOSIS — M542 Cervicalgia: Secondary | ICD-10-CM | POA: Diagnosis not present

## 2021-11-18 DIAGNOSIS — M9902 Segmental and somatic dysfunction of thoracic region: Secondary | ICD-10-CM | POA: Diagnosis not present

## 2021-11-18 DIAGNOSIS — M546 Pain in thoracic spine: Secondary | ICD-10-CM | POA: Diagnosis not present

## 2021-11-18 DIAGNOSIS — M9905 Segmental and somatic dysfunction of pelvic region: Secondary | ICD-10-CM | POA: Diagnosis not present

## 2021-11-18 DIAGNOSIS — M9901 Segmental and somatic dysfunction of cervical region: Secondary | ICD-10-CM | POA: Diagnosis not present

## 2021-11-22 DIAGNOSIS — R7303 Prediabetes: Secondary | ICD-10-CM | POA: Diagnosis not present

## 2021-11-22 DIAGNOSIS — I10 Essential (primary) hypertension: Secondary | ICD-10-CM | POA: Diagnosis not present

## 2021-11-29 DIAGNOSIS — E782 Mixed hyperlipidemia: Secondary | ICD-10-CM | POA: Diagnosis not present

## 2021-11-29 DIAGNOSIS — I10 Essential (primary) hypertension: Secondary | ICD-10-CM | POA: Diagnosis not present

## 2021-11-29 DIAGNOSIS — I509 Heart failure, unspecified: Secondary | ICD-10-CM | POA: Diagnosis not present

## 2021-11-29 DIAGNOSIS — E785 Hyperlipidemia, unspecified: Secondary | ICD-10-CM | POA: Diagnosis not present

## 2021-11-29 DIAGNOSIS — R7303 Prediabetes: Secondary | ICD-10-CM | POA: Diagnosis not present

## 2021-11-29 DIAGNOSIS — Z6836 Body mass index (BMI) 36.0-36.9, adult: Secondary | ICD-10-CM | POA: Diagnosis not present

## 2021-11-29 DIAGNOSIS — K219 Gastro-esophageal reflux disease without esophagitis: Secondary | ICD-10-CM | POA: Diagnosis not present

## 2021-12-19 ENCOUNTER — Telehealth: Payer: Self-pay

## 2021-12-19 NOTE — Telephone Encounter (Signed)
Called and asked patient if she had received CPAP yet, if so to bring SD card to appt. Patient states she has not recevied CPAP yet because she had to go to [facility] to [facility] up. Wants to discuss inogen with Dr. Halford Chessman tomorrow during appt.  ?

## 2021-12-20 ENCOUNTER — Other Ambulatory Visit: Payer: Self-pay

## 2021-12-20 ENCOUNTER — Encounter: Payer: Self-pay | Admitting: Pulmonary Disease

## 2021-12-20 ENCOUNTER — Ambulatory Visit: Payer: Medicare HMO | Admitting: Pulmonary Disease

## 2021-12-20 VITALS — BP 130/74 | HR 78 | Temp 98.9°F | Ht <= 58 in | Wt 181.8 lb

## 2021-12-20 DIAGNOSIS — G4730 Sleep apnea, unspecified: Secondary | ICD-10-CM

## 2021-12-20 DIAGNOSIS — G4733 Obstructive sleep apnea (adult) (pediatric): Secondary | ICD-10-CM

## 2021-12-20 DIAGNOSIS — J9611 Chronic respiratory failure with hypoxia: Secondary | ICD-10-CM

## 2021-12-20 DIAGNOSIS — E669 Obesity, unspecified: Secondary | ICD-10-CM | POA: Diagnosis not present

## 2021-12-20 NOTE — Progress Notes (Signed)
? ?[facility] ? ?Chief Complaint  ?Patient presents with  ? Follow-up  ?  Patient wants to discuss inspire device.  ?Does not have CPAP machine.   ? ? ? ?Constitutional:  ?BP 130/74 (BP Location: Left Arm, Patient Position: Sitting)   Pulse 78   Temp 98.9 ?F (37.2 ?C) (Temporal)   Ht '4\' 10"'$  (1.473 m)   Wt 181 lb 12.8 oz (82.5 kg)   SpO2 96% Comment: ra  BMI 38.00 kg/m?  ? ?Past Medical History:  ?Hypertension, Systolic/Diastolic CHF, DM type 2, COVID 19 PNA with ARDS January 2021, GERD ? ?Past Surgical History:  ?She  has a past surgical history that includes No past surgeries and Colonoscopy (N/A, 12/21/2016). ? ?Brief Summary:  ?Kristina [name] is a 71 y.o. female with dyspnea on exertion, snoring, and nocturnal hypoxemia. ?  ? ? ? ?Subjective:  ? ?She had sleep study.  Showed moderate sleep apnea and low oxygen.  She hasn't received CPAP yet.  Asking about Inspire device. ? ?Physical Exam:  ? ?Appearance - well kempt  ? ?ENMT - no sinus tenderness, no oral exudate, no LAN, Mallampati 4 airway, no stridor ? ?Respiratory - equal breath sounds bilaterally, no wheezing or rales ? ?CV - s1s2 regular rate and rhythm, no murmurs ? ?Ext - no clubbing, no edema ? ?Skin - no rashes ? ?Psych - normal mood and affect ? ?  ?Pulmonary testing:  ?ABG on 50% FiO2 04/06/21 >> pH 7.34, PCO2 76.6, PO2 69.9 ?PFT 06/22/21 >> FEV1 1.25 (71%), FEV1% 80, TLC 2.61 (62%), DLCO 105% ? ?Chest Imaging:  ?CT angio chest 04/05/21 >> basilar ATX, atherosclerosis ?Sniff test 05/26/21 >> Mildly elevated LEFT diaphragm does demonstrate normal descent with sniffing, though lesser degree of descent and is demonstrated by the RIGHT diaphragm.  No paradoxical LEFT diaphragmatic motion is identified to suggest diaphragmatic paralysis. ? ?Sleep Tests:  ?HST 07/20/21 >> AHI 29.6, SpO2 low 37%.  Spent 601 min with SpO2 < 89%. ? ?Cardiac Tests:  ?Echo 04/06/21 >> EF 60 to 65%, grade 1 DD, mild MR ? ?Social  History:  ?She  reports that she has never smoked. She has never used smokeless tobacco. She reports that she does not drink alcohol and does not use drugs. ? ?Family History:  ?Her family history includes Breast cancer in her mother; Heart attack in her father. ?  ? ? ?Assessment/Plan:  ? ?Dyspnea on exertion. ?- related to combined CHF, and obesity with deconditioning causing restrictive lung defect ?- encouraged her to maintain a regular exercise regimen ? ?Chronic hypoxic/hypercapnic respiratory failure from obesity hypoventilation syndrome. ?- continue 2 liters oxygen at night until she gets CPAP set up ? ?Obstructive sleep apnea. ?- reviewed indication for being a candidate for an inspire device; currently her BMI is too high ?- will check on status of CPAP set up ? ?Chronic combined CHF. ?- followed by Dr. Carlyle Dolly with [facility] ? ?Obesity. ?- discussed how weight can impact sleep and risk for sleep disordered breathing ?- discussed options to assist with weight loss: combination of diet modification, cardiovascular and strength training exercises ? ?Time Spent Involved in Patient Care on Day of Examination:  ?27 minutes ? ?Follow up:  ? ?Patient Instructions  ?To get your BMI to less than 35 you would need to get your weight down to 165 lbs.  To get your BMI less than 32 you would need to get your weight down to  153 lbs. ? ?Will check on status of CPAP set up. ? ?Follow up in 4 months. ? ?Medication List:  ? ?Allergies as of 12/20/2021   ?No Known Allergies ?  ? ?  ?Medication List  ?  ? ?  ? Accurate as of December 20, 2021 10:38 AM. If you have any questions, ask your nurse or doctor.  ?  ?  ? ?  ? ?acetaminophen 325 MG tablet ?Commonly known as: TYLENOL ?Take 2 tablets (650 mg total) by mouth every 6 (six) hours as needed for mild pain (or Fever >/= 101). ?  ?albuterol (2.5 MG/3ML) 0.083% nebulizer solution ?Commonly known as: PROVENTIL ?Take 3 mLs (2.5 mg total) by nebulization every 6 (six)  hours as needed for wheezing or shortness of breath. ?  ?amLODipine 10 MG tablet ?Commonly known as: NORVASC ?Take 1 tablet (10 mg total) by mouth daily. ?  ?aspirin EC 81 MG tablet ?Take 1 tablet (81 mg total) by mouth daily with breakfast. ?  ?carvedilol 6.25 MG tablet ?Commonly known as: COREG ?Take 1 tablet (6.25 mg total) by mouth 2 (two) times daily. ?  ?furosemide 40 MG tablet ?Commonly known as: LASIX ?Take 1 tablet (40 mg total) by mouth daily. ?  ?losartan 100 MG tablet ?Commonly known as: COZAAR ?Take 1 tablet (100 mg total) by mouth daily. ?  ?multivitamin tablet ?Take 1 tablet by mouth daily. ?  ?omeprazole 40 MG capsule ?Commonly known as: PRILOSEC ?Take 1 capsule by mouth daily. ?  ?rosuvastatin 10 MG tablet ?Commonly known as: CRESTOR ?Take 10 mg by mouth daily. ?  ? ?  ? ? ?Signature:  ?Chesley Mires, MD ?[place] ?Pager - [phone] ?12/20/2021, 10:38 AM ?  ? ? ? ? ? ? ?  ?

## 2021-12-20 NOTE — Patient Instructions (Signed)
To get your BMI to less than 35 you would need to get your weight down to 165 lbs.  To get your BMI less than 32 you would need to get your weight down to 153 lbs. ? ?Will check on status of CPAP set up. ? ?Follow up in 4 months. ?

## 2021-12-23 ENCOUNTER — Other Ambulatory Visit (HOSPITAL_COMMUNITY): Payer: Self-pay | Admitting: Internal Medicine

## 2021-12-23 DIAGNOSIS — Z1231 Encounter for screening mammogram for malignant neoplasm of breast: Secondary | ICD-10-CM

## 2021-12-29 ENCOUNTER — Ambulatory Visit (HOSPITAL_COMMUNITY)
Admission: RE | Admit: 2021-12-29 | Discharge: 2021-12-29 | Disposition: A | Discharge status: Home / Self Care | Payer: Medicare HMO | Source: Ambulatory Visit | Attending: Internal Medicine | Admitting: Internal Medicine

## 2021-12-29 DIAGNOSIS — Z1231 Encounter for screening mammogram for malignant neoplasm of breast: Secondary | ICD-10-CM | POA: Diagnosis not present

## 2021-12-30 DIAGNOSIS — R0902 Hypoxemia: Secondary | ICD-10-CM | POA: Diagnosis not present

## 2021-12-30 DIAGNOSIS — G4733 Obstructive sleep apnea (adult) (pediatric): Secondary | ICD-10-CM | POA: Diagnosis not present

## 2022-01-06 DIAGNOSIS — M545 Low back pain, unspecified: Secondary | ICD-10-CM | POA: Diagnosis not present

## 2022-01-13 DIAGNOSIS — M546 Pain in thoracic spine: Secondary | ICD-10-CM | POA: Diagnosis not present

## 2022-01-13 DIAGNOSIS — M62830 Muscle spasm of back: Secondary | ICD-10-CM | POA: Diagnosis not present

## 2022-01-13 DIAGNOSIS — M9902 Segmental and somatic dysfunction of thoracic region: Secondary | ICD-10-CM | POA: Diagnosis not present

## 2022-01-13 DIAGNOSIS — M25551 Pain in right hip: Secondary | ICD-10-CM | POA: Diagnosis not present

## 2022-01-13 DIAGNOSIS — M542 Cervicalgia: Secondary | ICD-10-CM | POA: Diagnosis not present

## 2022-01-13 DIAGNOSIS — M9905 Segmental and somatic dysfunction of pelvic region: Secondary | ICD-10-CM | POA: Diagnosis not present

## 2022-01-13 DIAGNOSIS — M9901 Segmental and somatic dysfunction of cervical region: Secondary | ICD-10-CM | POA: Diagnosis not present

## 2022-01-13 DIAGNOSIS — M9903 Segmental and somatic dysfunction of lumbar region: Secondary | ICD-10-CM | POA: Diagnosis not present

## 2022-01-16 DIAGNOSIS — M546 Pain in thoracic spine: Secondary | ICD-10-CM | POA: Diagnosis not present

## 2022-01-16 DIAGNOSIS — M542 Cervicalgia: Secondary | ICD-10-CM | POA: Diagnosis not present

## 2022-01-16 DIAGNOSIS — M62830 Muscle spasm of back: Secondary | ICD-10-CM | POA: Diagnosis not present

## 2022-01-16 DIAGNOSIS — M9905 Segmental and somatic dysfunction of pelvic region: Secondary | ICD-10-CM | POA: Diagnosis not present

## 2022-01-16 DIAGNOSIS — M9902 Segmental and somatic dysfunction of thoracic region: Secondary | ICD-10-CM | POA: Diagnosis not present

## 2022-01-16 DIAGNOSIS — M25551 Pain in right hip: Secondary | ICD-10-CM | POA: Diagnosis not present

## 2022-01-16 DIAGNOSIS — M9903 Segmental and somatic dysfunction of lumbar region: Secondary | ICD-10-CM | POA: Diagnosis not present

## 2022-01-16 DIAGNOSIS — M9901 Segmental and somatic dysfunction of cervical region: Secondary | ICD-10-CM | POA: Diagnosis not present

## 2022-01-29 DIAGNOSIS — R0902 Hypoxemia: Secondary | ICD-10-CM | POA: Diagnosis not present

## 2022-01-29 DIAGNOSIS — G4733 Obstructive sleep apnea (adult) (pediatric): Secondary | ICD-10-CM | POA: Diagnosis not present

## 2022-02-13 ENCOUNTER — Encounter: Payer: Self-pay | Admitting: *Deleted

## 2022-02-14 ENCOUNTER — Ambulatory Visit: Payer: Medicare HMO | Admitting: Cardiology

## 2022-02-14 ENCOUNTER — Encounter: Payer: Self-pay | Admitting: Cardiology

## 2022-02-14 VITALS — BP 140/80 | HR 86 | Ht <= 58 in | Wt 178.2 lb

## 2022-02-14 DIAGNOSIS — I10 Essential (primary) hypertension: Secondary | ICD-10-CM

## 2022-02-14 DIAGNOSIS — E782 Mixed hyperlipidemia: Secondary | ICD-10-CM

## 2022-02-14 DIAGNOSIS — I5032 Chronic diastolic (congestive) heart failure: Secondary | ICD-10-CM

## 2022-02-14 NOTE — Patient Instructions (Signed)

## 2022-02-14 NOTE — Progress Notes (Signed)
Clinical Summary Ms. Garn is a 71 y.o.female seen today for follow up of the following medical problems.   1.Chronic diastolic HF - 03/3531 echo LVEF60-65%, grade I dd, normal RV function - admit 03/2021 volume overload, diuresed - no recent edema. Down 11 lbs since 04/2021. Exercising 3 days a week.  - home weights stable 175 lbs - chronic SOB/DOE with high levels of exertion.     2. HTN - compliant with meds - home bp's 130s/70s   3. Restrictive lung disease - secondary to obesity - she is on 2L [place] at home with exertion and sleep  4. OSA - followed by Dr Halford Chessman, sleep test showed mod to severe OSA  5. Hyperlipidemia - labs followed by pcp    Daughter is Delrae Sawyers also a patient of mine  Past Medical History:  Diagnosis Date   Acute respiratory distress syndrome (ARDS) due to COVID-19 virus ([place]) 09/2019   Chronic combined systolic and diastolic CHF (congestive heart failure) (HCC)    Diabetes mellitus, type 2 (HCC)    GERD (gastroesophageal reflux disease)    Hyperlipidemia    Hypertension      No Known Allergies   Current Outpatient Medications  Medication Sig Dispense Refill   acetaminophen (TYLENOL) 325 MG tablet Take 2 tablets (650 mg total) by mouth every 6 (six) hours as needed for mild pain (or Fever >/= 101). 12 tablet 0   albuterol (PROVENTIL) (2.5 MG/3ML) 0.083% nebulizer solution Take 3 mLs (2.5 mg total) by nebulization every 6 (six) hours as needed for wheezing or shortness of breath. 75 mL 12   amLODipine (NORVASC) 10 MG tablet Take 1 tablet (10 mg total) by mouth daily. 30 tablet 3   aspirin EC 81 MG tablet Take 1 tablet (81 mg total) by mouth daily with breakfast. 30 tablet 11   carvedilol (COREG) 6.25 MG tablet Take 1 tablet (6.25 mg total) by mouth 2 (two) times daily. 180 tablet 3   furosemide (LASIX) 40 MG tablet Take 1 tablet (40 mg total) by mouth daily. 30 tablet 2   losartan (COZAAR) 100 MG tablet Take 1 tablet (100 mg total) by  mouth daily. 30 tablet 5   Multiple Vitamin (MULTIVITAMIN) tablet Take 1 tablet by mouth daily.     omeprazole (PRILOSEC) 40 MG capsule Take 1 capsule by mouth daily.     rosuvastatin (CRESTOR) 10 MG tablet Take 10 mg by mouth daily.     No current facility-administered medications for this visit.     Past Surgical History:  Procedure Laterality Date   COLONOSCOPY N/A 12/21/2016   Procedure: COLONOSCOPY;  Surgeon: Rogene Houston, MD;  Location: [facility];  Service: Endoscopy;  Laterality: N/A;  85   NO PAST SURGERIES       No Known Allergies    Family History  Problem Relation Age of Onset   Breast cancer Mother    Heart attack Father    Colon cancer Neg Hx      Social History Ms. Perrelli reports that she has never smoked. She has never used smokeless tobacco. Ms. Bossard reports no history of alcohol use.   Review of Systems CONSTITUTIONAL: No weight loss, fever, chills, weakness or fatigue.  HEENT: Eyes: No visual loss, blurred vision, double vision or yellow sclerae.No hearing loss, sneezing, congestion, runny nose or sore throat.  SKIN: No rash or itching.  CARDIOVASCULAR: per hpi RESPIRATORY: per hpi.  GASTROINTESTINAL: No anorexia, nausea, vomiting or diarrhea.  No abdominal pain or blood.  GENITOURINARY: No burning on urination, no polyuria NEUROLOGICAL: No headache, dizziness, syncope, paralysis, ataxia, numbness or tingling in the extremities. No change in bowel or bladder control.  MUSCULOSKELETAL: No muscle, back pain, joint pain or stiffness.  LYMPHATICS: No enlarged nodes. No history of splenectomy.  PSYCHIATRIC: No history of depression or anxiety.  ENDOCRINOLOGIC: No reports of sweating, cold or heat intolerance. No polyuria or polydipsia.  Marland Kitchen   Physical Examination Today's Vitals   02/14/22 0829 02/14/22 0854  BP: (!) 158/90 140/80  Pulse: 86   SpO2: 94%   Weight: 178 lb 3.2 oz (80.8 kg)   Height: '4\' 9"'$  (1.448 m)    Body mass index is 38.56  kg/m.  Gen: resting comfortably, no acute distress HEENT: no scleral icterus, pupils equal round and reactive, no palptable cervical adenopathy,  CV: RRR, no m/r/g no jvd Resp: Clear to auscultation bilaterally GI: abdomen is soft, non-tender, non-distended, normal bowel sounds, no hepatosplenomegaly MSK: extremities are warm, no edema.  Skin: warm, no rash Neuro:  no focal deficits Psych: appropriate affect     Assessment and Plan  1.Chronic diastolic HF - remains euvolemic, continue current lasix dosing  2. HTN - elevated here but home numbers at goal, continue current meds  3. Hyperlipidemia - request labs from pcp, continue crestor        Arnoldo Lenis, M.D.

## 2022-02-17 DIAGNOSIS — M9902 Segmental and somatic dysfunction of thoracic region: Secondary | ICD-10-CM | POA: Diagnosis not present

## 2022-02-17 DIAGNOSIS — M9905 Segmental and somatic dysfunction of pelvic region: Secondary | ICD-10-CM | POA: Diagnosis not present

## 2022-02-17 DIAGNOSIS — M542 Cervicalgia: Secondary | ICD-10-CM | POA: Diagnosis not present

## 2022-02-17 DIAGNOSIS — M25551 Pain in right hip: Secondary | ICD-10-CM | POA: Diagnosis not present

## 2022-02-17 DIAGNOSIS — M9903 Segmental and somatic dysfunction of lumbar region: Secondary | ICD-10-CM | POA: Diagnosis not present

## 2022-02-17 DIAGNOSIS — M9901 Segmental and somatic dysfunction of cervical region: Secondary | ICD-10-CM | POA: Diagnosis not present

## 2022-02-17 DIAGNOSIS — M546 Pain in thoracic spine: Secondary | ICD-10-CM | POA: Diagnosis not present

## 2022-02-17 DIAGNOSIS — M62830 Muscle spasm of back: Secondary | ICD-10-CM | POA: Diagnosis not present

## 2022-02-27 DIAGNOSIS — I110 Hypertensive heart disease with heart failure: Secondary | ICD-10-CM | POA: Diagnosis not present

## 2022-02-27 DIAGNOSIS — E785 Hyperlipidemia, unspecified: Secondary | ICD-10-CM | POA: Diagnosis not present

## 2022-02-27 DIAGNOSIS — Z8249 Family history of ischemic heart disease and other diseases of the circulatory system: Secondary | ICD-10-CM | POA: Diagnosis not present

## 2022-02-27 DIAGNOSIS — I509 Heart failure, unspecified: Secondary | ICD-10-CM | POA: Diagnosis not present

## 2022-02-27 DIAGNOSIS — E261 Secondary hyperaldosteronism: Secondary | ICD-10-CM | POA: Diagnosis not present

## 2022-02-27 DIAGNOSIS — Z809 Family history of malignant neoplasm, unspecified: Secondary | ICD-10-CM | POA: Diagnosis not present

## 2022-02-27 DIAGNOSIS — K219 Gastro-esophageal reflux disease without esophagitis: Secondary | ICD-10-CM | POA: Diagnosis not present

## 2022-02-27 DIAGNOSIS — Z6838 Body mass index (BMI) 38.0-38.9, adult: Secondary | ICD-10-CM | POA: Diagnosis not present

## 2022-03-01 DIAGNOSIS — G4733 Obstructive sleep apnea (adult) (pediatric): Secondary | ICD-10-CM | POA: Diagnosis not present

## 2022-03-01 DIAGNOSIS — R0902 Hypoxemia: Secondary | ICD-10-CM | POA: Diagnosis not present

## 2022-03-31 DIAGNOSIS — G4733 Obstructive sleep apnea (adult) (pediatric): Secondary | ICD-10-CM | POA: Diagnosis not present

## 2022-03-31 DIAGNOSIS — R0902 Hypoxemia: Secondary | ICD-10-CM | POA: Diagnosis not present

## 2022-04-04 ENCOUNTER — Encounter: Payer: Self-pay | Admitting: Pulmonary Disease

## 2022-04-04 ENCOUNTER — Ambulatory Visit: Payer: Medicare HMO | Admitting: Pulmonary Disease

## 2022-04-04 VITALS — BP 134/88 | HR 88 | Temp 98.6°F | Ht <= 58 in | Wt 182.2 lb

## 2022-04-04 DIAGNOSIS — E662 Morbid (severe) obesity with alveolar hypoventilation: Secondary | ICD-10-CM

## 2022-04-04 DIAGNOSIS — G4733 Obstructive sleep apnea (adult) (pediatric): Secondary | ICD-10-CM | POA: Diagnosis not present

## 2022-04-04 DIAGNOSIS — J9611 Chronic respiratory failure with hypoxia: Secondary | ICD-10-CM | POA: Diagnosis not present

## 2022-04-04 NOTE — Patient Instructions (Signed)
Will arrange to have your CPAP mask refit  Follow up in 1 year

## 2022-04-04 NOTE — Progress Notes (Signed)
[name]  Chief Complaint  Patient presents with   Follow-up    Patient states she is getting used to her cpap machine,      Constitutional:  BP 134/88 (BP Location: Left Arm, Patient Position: Sitting)   Pulse 88   Temp 98.6 F (37 C) (Temporal)   Ht '4\' 9"'$  (1.448 m)   Wt 182 lb 3.2 oz (82.6 kg)   SpO2 94% Comment: ra  BMI 39.43 kg/m   Past Medical History:  Hypertension, Systolic/Diastolic CHF, DM type 2, COVID 19 PNA with ARDS January 2021, GERD  Past Surgical History:  She  has a past surgical history that includes No past surgeries and Colonoscopy (N/A, 12/21/2016).  Brief Summary:  Kristina [name] is a 71 y.o. female with dyspnea on exertion, obstructive sleep apnea, and nocturnal hypoxemia.      Subjective:   She is slowly getting used to wearing CPAP.  Doesn't like full face mask.  Pressure is okay.  She purchased home oxygen, but isn't using currently.  Sleeping better, and feels more alert.  Physical Exam:   Appearance - well kempt   ENMT - no sinus tenderness, no oral exudate, no LAN, Mallampati 4 airway, no stridor  Respiratory - equal breath sounds bilaterally, no wheezing or rales  CV - s1s2 regular rate and rhythm, no murmurs  Ext - no clubbing, no edema  Skin - no rashes  Psych - normal mood and affect    Pulmonary testing:  ABG on 50% FiO2 04/06/21 >> pH 7.34, PCO2 76.6, PO2 69.9 PFT 06/22/21 >> FEV1 1.25 (71%), FEV1% 80, TLC 2.61 (62%), DLCO 105%  Chest Imaging:  CT angio chest 04/05/21 >> basilar ATX, atherosclerosis Sniff test 05/26/21 >> Mildly elevated LEFT diaphragm does demonstrate normal descent with sniffing, though lesser degree of descent and is demonstrated by the RIGHT diaphragm.  No paradoxical LEFT diaphragmatic motion is identified to suggest diaphragmatic paralysis.  Sleep Tests:  HST 07/20/21 >> AHI 29.6, SpO2 low 37%.  Spent 601 min with SpO2 < 89%. Auto CPAP 03/04/22 to  04/02/22 >> used on 30 of 30 nights with average 4 hrs 42 min.  Average AHI 2 with median CPAP 7 and 95 th percentile CPAP 11 cm H2O  Cardiac Tests:  Echo 04/06/21 >> EF 60 to 65%, grade 1 DD, mild MR  Social History:  She  reports that she has never smoked. She has never used smokeless tobacco. She reports that she does not drink alcohol and does not use drugs.  Family History:  Her family history includes Breast cancer in her mother; Heart attack in her father.     Assessment/Plan:   Obstructive sleep apnea with obesity hypoventilation syndrome. - she is compliant with CPAP and reports benefit from therapy - she uses [employer] for her DME - she received her current CPAP in May 2023 - continue auto CPAP 5 to 20 cm H2O - she purchased home oxygen set up, but hasn't been using recently  Chronic combined CHF. - followed by Dr. Carlyle Dolly with [facility]  Obesity. - discussed how weight can impact sleep and risk for sleep disordered breathing - discussed options to assist with weight loss: combination of diet modification, cardiovascular and strength training exercises  Time Spent Involved in Patient Care on Day of Examination:  36 minutes  Follow up:   Patient Instructions  Will arrange to have your CPAP mask refit  Follow up in 1  year  Medication List:   Allergies as of 04/04/2022   No Known Allergies      Medication List        Accurate as of April 04, 2022 10:59 AM. If you have any questions, ask your nurse or doctor.          acetaminophen 325 MG tablet Commonly known as: TYLENOL Take 2 tablets (650 mg total) by mouth every 6 (six) hours as needed for mild pain (or Fever >/= 101).   albuterol (2.5 MG/3ML) 0.083% nebulizer solution Commonly known as: PROVENTIL Take 3 mLs (2.5 mg total) by nebulization every 6 (six) hours as needed for wheezing or shortness of breath.   amLODipine 10 MG tablet Commonly known as: NORVASC Take 1 tablet (10 mg total)  by mouth daily.   aspirin EC 81 MG tablet Take 1 tablet (81 mg total) by mouth daily with breakfast.   carvedilol 6.25 MG tablet Commonly known as: COREG Take 1 tablet (6.25 mg total) by mouth 2 (two) times daily.   furosemide 40 MG tablet Commonly known as: LASIX Take 1 tablet (40 mg total) by mouth daily.   losartan 100 MG tablet Commonly known as: COZAAR Take 1 tablet (100 mg total) by mouth daily.   multivitamin tablet Take 1 tablet by mouth daily.   omeprazole 40 MG capsule Commonly known as: PRILOSEC Take 1 capsule by mouth daily.   rosuvastatin 10 MG tablet Commonly known as: CRESTOR Take 10 mg by mouth daily.        Signature:  Chesley Mires, MD [place] Pager - [phone] 04/04/2022, 10:59 AM

## 2022-04-07 DIAGNOSIS — M9905 Segmental and somatic dysfunction of pelvic region: Secondary | ICD-10-CM | POA: Diagnosis not present

## 2022-04-07 DIAGNOSIS — M9903 Segmental and somatic dysfunction of lumbar region: Secondary | ICD-10-CM | POA: Diagnosis not present

## 2022-04-07 DIAGNOSIS — M542 Cervicalgia: Secondary | ICD-10-CM | POA: Diagnosis not present

## 2022-04-07 DIAGNOSIS — M9902 Segmental and somatic dysfunction of thoracic region: Secondary | ICD-10-CM | POA: Diagnosis not present

## 2022-04-07 DIAGNOSIS — M9901 Segmental and somatic dysfunction of cervical region: Secondary | ICD-10-CM | POA: Diagnosis not present

## 2022-04-07 DIAGNOSIS — M546 Pain in thoracic spine: Secondary | ICD-10-CM | POA: Diagnosis not present

## 2022-04-07 DIAGNOSIS — M62830 Muscle spasm of back: Secondary | ICD-10-CM | POA: Diagnosis not present

## 2022-04-07 DIAGNOSIS — M25551 Pain in right hip: Secondary | ICD-10-CM | POA: Diagnosis not present

## 2022-04-27 DIAGNOSIS — G4733 Obstructive sleep apnea (adult) (pediatric): Secondary | ICD-10-CM | POA: Diagnosis not present

## 2022-04-27 DIAGNOSIS — R0902 Hypoxemia: Secondary | ICD-10-CM | POA: Diagnosis not present

## 2022-05-01 DIAGNOSIS — G4733 Obstructive sleep apnea (adult) (pediatric): Secondary | ICD-10-CM | POA: Diagnosis not present

## 2022-05-01 DIAGNOSIS — R0902 Hypoxemia: Secondary | ICD-10-CM | POA: Diagnosis not present

## 2022-05-02 DIAGNOSIS — G4733 Obstructive sleep apnea (adult) (pediatric): Secondary | ICD-10-CM | POA: Diagnosis not present

## 2022-05-23 ENCOUNTER — Telehealth: Payer: Self-pay | Admitting: *Deleted

## 2022-05-23 NOTE — Patient Outreach (Signed)
  Care Coordination   Initial Visit Note   05/23/2022 Name: Kristina [name] MRN: [id number] DOB: 1951-08-17  Kristina [name] is a 71 y.o. year old female who sees Kristina [name], Kristina Areola, MD for primary care. I spoke with  Kristina [name] by phone today.  What matters to the patients health and wellness today?  No concerns expressed. RN discussed services [facility] services, RN, SW, and Pharmacist. Patient declined services.      Goals Addressed             This Visit's Progress    care cordination no follow up required       Advise patient for follow up with PCP to schedule AWV         SDOH assessments and interventions completed:  Yes     Care Coordination Interventions Activated:  Yes  Care Coordination Interventions:  Yes, provided   Follow up plan: No further intervention required.   Encounter Outcome:  Pt. Kristina [name] Care Management [phone]

## 2022-06-01 DIAGNOSIS — G4733 Obstructive sleep apnea (adult) (pediatric): Secondary | ICD-10-CM | POA: Diagnosis not present

## 2022-06-01 DIAGNOSIS — R0902 Hypoxemia: Secondary | ICD-10-CM | POA: Diagnosis not present

## 2022-06-02 DIAGNOSIS — M25551 Pain in right hip: Secondary | ICD-10-CM | POA: Diagnosis not present

## 2022-06-02 DIAGNOSIS — M9902 Segmental and somatic dysfunction of thoracic region: Secondary | ICD-10-CM | POA: Diagnosis not present

## 2022-06-02 DIAGNOSIS — M542 Cervicalgia: Secondary | ICD-10-CM | POA: Diagnosis not present

## 2022-06-02 DIAGNOSIS — M9901 Segmental and somatic dysfunction of cervical region: Secondary | ICD-10-CM | POA: Diagnosis not present

## 2022-06-02 DIAGNOSIS — M62830 Muscle spasm of back: Secondary | ICD-10-CM | POA: Diagnosis not present

## 2022-06-02 DIAGNOSIS — M546 Pain in thoracic spine: Secondary | ICD-10-CM | POA: Diagnosis not present

## 2022-06-02 DIAGNOSIS — M9903 Segmental and somatic dysfunction of lumbar region: Secondary | ICD-10-CM | POA: Diagnosis not present

## 2022-06-02 DIAGNOSIS — M9905 Segmental and somatic dysfunction of pelvic region: Secondary | ICD-10-CM | POA: Diagnosis not present

## 2022-06-08 DIAGNOSIS — D3610 Benign neoplasm of peripheral nerves and autonomic nervous system, unspecified: Secondary | ICD-10-CM | POA: Diagnosis not present

## 2022-06-08 DIAGNOSIS — D2239 Melanocytic nevi of other parts of face: Secondary | ICD-10-CM | POA: Diagnosis not present

## 2022-06-08 DIAGNOSIS — L820 Inflamed seborrheic keratosis: Secondary | ICD-10-CM | POA: Diagnosis not present

## 2022-06-08 DIAGNOSIS — D216 Benign neoplasm of connective and other soft tissue of trunk, unspecified: Secondary | ICD-10-CM | POA: Diagnosis not present

## 2022-06-09 DIAGNOSIS — E785 Hyperlipidemia, unspecified: Secondary | ICD-10-CM | POA: Diagnosis not present

## 2022-06-09 DIAGNOSIS — R7303 Prediabetes: Secondary | ICD-10-CM | POA: Diagnosis not present

## 2022-06-09 DIAGNOSIS — I10 Essential (primary) hypertension: Secondary | ICD-10-CM | POA: Diagnosis not present

## 2022-06-15 DIAGNOSIS — R809 Proteinuria, unspecified: Secondary | ICD-10-CM | POA: Insufficient documentation

## 2022-06-16 DIAGNOSIS — R7303 Prediabetes: Secondary | ICD-10-CM | POA: Diagnosis not present

## 2022-06-16 DIAGNOSIS — G4730 Sleep apnea, unspecified: Secondary | ICD-10-CM | POA: Diagnosis not present

## 2022-06-16 DIAGNOSIS — R809 Proteinuria, unspecified: Secondary | ICD-10-CM | POA: Diagnosis not present

## 2022-06-16 DIAGNOSIS — I10 Essential (primary) hypertension: Secondary | ICD-10-CM | POA: Diagnosis not present

## 2022-06-16 DIAGNOSIS — E785 Hyperlipidemia, unspecified: Secondary | ICD-10-CM | POA: Diagnosis not present

## 2022-06-16 DIAGNOSIS — K219 Gastro-esophageal reflux disease without esophagitis: Secondary | ICD-10-CM | POA: Diagnosis not present

## 2022-06-16 DIAGNOSIS — Z0000 Encounter for general adult medical examination without abnormal findings: Secondary | ICD-10-CM | POA: Diagnosis not present

## 2022-06-16 DIAGNOSIS — I509 Heart failure, unspecified: Secondary | ICD-10-CM | POA: Diagnosis not present

## 2022-06-16 DIAGNOSIS — E782 Mixed hyperlipidemia: Secondary | ICD-10-CM | POA: Diagnosis not present

## 2022-06-25 DIAGNOSIS — G4733 Obstructive sleep apnea (adult) (pediatric): Secondary | ICD-10-CM | POA: Diagnosis not present

## 2022-07-01 DIAGNOSIS — R0902 Hypoxemia: Secondary | ICD-10-CM | POA: Diagnosis not present

## 2022-07-01 DIAGNOSIS — G4733 Obstructive sleep apnea (adult) (pediatric): Secondary | ICD-10-CM | POA: Diagnosis not present

## 2022-07-03 ENCOUNTER — Other Ambulatory Visit: Payer: Self-pay

## 2022-07-03 MED ORDER — CARVEDILOL 6.25 MG PO TABS: 6.2500 mg | ORAL_TABLET | Freq: Two times a day (BID) | ORAL | 1 refills | Status: DC

## 2022-07-16 DIAGNOSIS — M545 Low back pain, unspecified: Secondary | ICD-10-CM | POA: Diagnosis not present

## 2022-07-16 DIAGNOSIS — W19XXXA Unspecified fall, initial encounter: Secondary | ICD-10-CM | POA: Diagnosis not present

## 2022-07-17 ENCOUNTER — Other Ambulatory Visit (HOSPITAL_COMMUNITY): Payer: Self-pay | Admitting: Urgent Care

## 2022-07-17 DIAGNOSIS — M545 Low back pain, unspecified: Secondary | ICD-10-CM

## 2022-07-18 ENCOUNTER — Ambulatory Visit (HOSPITAL_COMMUNITY)
Admission: RE | Admit: 2022-07-18 | Discharge: 2022-07-18 | Disposition: A | Discharge status: Home / Self Care | Payer: Medicare HMO | Source: Ambulatory Visit | Attending: Urgent Care | Admitting: Urgent Care

## 2022-07-18 ENCOUNTER — Ambulatory Visit (HOSPITAL_COMMUNITY)
Admission: RE | Admit: 2022-07-18 | Discharge: 2022-07-18 | Disposition: A | Discharge status: Home / Self Care | Payer: Medicare HMO | Source: Ambulatory Visit | Attending: Family Medicine | Admitting: Family Medicine

## 2022-07-18 ENCOUNTER — Other Ambulatory Visit (HOSPITAL_COMMUNITY): Payer: Self-pay | Admitting: Family Medicine

## 2022-07-18 DIAGNOSIS — M25552 Pain in left hip: Secondary | ICD-10-CM

## 2022-07-18 DIAGNOSIS — M545 Low back pain, unspecified: Secondary | ICD-10-CM | POA: Insufficient documentation

## 2022-07-24 DIAGNOSIS — S22070A Wedge compression fracture of T9-T10 vertebra, initial encounter for closed fracture: Secondary | ICD-10-CM | POA: Diagnosis not present

## 2022-07-24 DIAGNOSIS — Z6837 Body mass index (BMI) 37.0-37.9, adult: Secondary | ICD-10-CM | POA: Diagnosis not present

## 2022-07-24 DIAGNOSIS — S32020A Wedge compression fracture of second lumbar vertebra, initial encounter for closed fracture: Secondary | ICD-10-CM | POA: Diagnosis not present

## 2022-07-26 DIAGNOSIS — G4733 Obstructive sleep apnea (adult) (pediatric): Secondary | ICD-10-CM | POA: Diagnosis not present

## 2022-07-26 DIAGNOSIS — R0902 Hypoxemia: Secondary | ICD-10-CM | POA: Diagnosis not present

## 2022-07-27 ENCOUNTER — Other Ambulatory Visit (HOSPITAL_COMMUNITY): Payer: Self-pay | Admitting: Neurosurgery

## 2022-07-27 DIAGNOSIS — S32020A Wedge compression fracture of second lumbar vertebra, initial encounter for closed fracture: Secondary | ICD-10-CM

## 2022-07-27 DIAGNOSIS — S22070A Wedge compression fracture of T9-T10 vertebra, initial encounter for closed fracture: Secondary | ICD-10-CM

## 2022-08-02 DIAGNOSIS — G4733 Obstructive sleep apnea (adult) (pediatric): Secondary | ICD-10-CM | POA: Diagnosis not present

## 2022-08-06 ENCOUNTER — Ambulatory Visit (HOSPITAL_COMMUNITY)
Admission: RE | Admit: 2022-08-06 | Discharge: 2022-08-06 | Disposition: A | Discharge status: Home / Self Care | Payer: Medicare HMO | Source: Ambulatory Visit | Attending: Neurosurgery | Admitting: Neurosurgery

## 2022-08-06 DIAGNOSIS — S22070A Wedge compression fracture of T9-T10 vertebra, initial encounter for closed fracture: Secondary | ICD-10-CM | POA: Insufficient documentation

## 2022-08-06 DIAGNOSIS — M48061 Spinal stenosis, lumbar region without neurogenic claudication: Secondary | ICD-10-CM | POA: Diagnosis not present

## 2022-08-06 DIAGNOSIS — M545 Low back pain, unspecified: Secondary | ICD-10-CM | POA: Diagnosis not present

## 2022-08-06 DIAGNOSIS — S32020A Wedge compression fracture of second lumbar vertebra, initial encounter for closed fracture: Secondary | ICD-10-CM | POA: Diagnosis not present

## 2022-08-06 DIAGNOSIS — M4316 Spondylolisthesis, lumbar region: Secondary | ICD-10-CM | POA: Diagnosis not present

## 2022-08-06 DIAGNOSIS — M549 Dorsalgia, unspecified: Secondary | ICD-10-CM | POA: Diagnosis not present

## 2022-08-11 DIAGNOSIS — M5416 Radiculopathy, lumbar region: Secondary | ICD-10-CM | POA: Diagnosis not present

## 2022-08-23 ENCOUNTER — Encounter: Payer: Self-pay | Admitting: Cardiology

## 2022-08-23 ENCOUNTER — Ambulatory Visit: Payer: Medicare HMO | Attending: Cardiology | Admitting: Cardiology

## 2022-08-23 VITALS — BP 148/90 | HR 84 | Ht <= 58 in | Wt 183.8 lb

## 2022-08-23 DIAGNOSIS — E782 Mixed hyperlipidemia: Secondary | ICD-10-CM

## 2022-08-23 DIAGNOSIS — I5032 Chronic diastolic (congestive) heart failure: Secondary | ICD-10-CM | POA: Diagnosis not present

## 2022-08-23 DIAGNOSIS — I10 Essential (primary) hypertension: Secondary | ICD-10-CM

## 2022-08-23 NOTE — Patient Instructions (Signed)

## 2022-08-23 NOTE — Progress Notes (Signed)
Clinical Summary Ms. Dicenzo is a 71 y.o.female seen today for follow up of the following medical problems.    1.Chronic diastolic HF - 04/1274 echo LVEF60-65%, grade I dd, normal RV function - admit 03/2021 volume overload, diuresed   - no recent LE edema, no SOB/DOE - compliant with lasix.      2. HTN - compliant with meds - reports white coat HTN - home bp's usually 130s/70s     3. Restrictive lung disease - secondary to obesity - she is on 2L [place] at home with exertion and sleep   4. OSA - followed by Dr Halford Chessman, sleep test showed mod to severe OSA - compliant cpap   5. Hyperlipidemia - labs followed by pcp  10/2021 TC 141 TG 160 HDL 45 LDL 69   6. Hip pain - recent fall and hip injury, currently wearing a brace   Daughter is Delrae Sawyers also a patient of mine     Past Medical History:  Diagnosis Date   Acute respiratory distress syndrome (ARDS) due to COVID-19 virus ([place]) 09/2019   Chronic combined systolic and diastolic CHF (congestive heart failure) (HCC)    Diabetes mellitus, type 2 (HCC)    GERD (gastroesophageal reflux disease)    Hyperlipidemia    Hypertension      No Known Allergies   Current Outpatient Medications  Medication Sig Dispense Refill   acetaminophen (TYLENOL) 325 MG tablet Take 2 tablets (650 mg total) by mouth every 6 (six) hours as needed for mild pain (or Fever >/= 101). 12 tablet 0   albuterol (PROVENTIL) (2.5 MG/3ML) 0.083% nebulizer solution Take 3 mLs (2.5 mg total) by nebulization every 6 (six) hours as needed for wheezing or shortness of breath. 75 mL 12   amLODipine (NORVASC) 10 MG tablet Take 1 tablet (10 mg total) by mouth daily. 30 tablet 3   aspirin EC 81 MG tablet Take 1 tablet (81 mg total) by mouth daily with breakfast. 30 tablet 11   carvedilol (COREG) 6.25 MG tablet Take 1 tablet (6.25 mg total) by mouth 2 (two) times daily. 180 tablet 1   furosemide (LASIX) 40 MG tablet Take 1 tablet (40 mg total) by mouth daily.  30 tablet 2   losartan (COZAAR) 100 MG tablet Take 1 tablet (100 mg total) by mouth daily. 30 tablet 5   Multiple Vitamin (MULTIVITAMIN) tablet Take 1 tablet by mouth daily.     omeprazole (PRILOSEC) 40 MG capsule Take 1 capsule by mouth daily.     rosuvastatin (CRESTOR) 10 MG tablet Take 10 mg by mouth daily.     No current facility-administered medications for this visit.     Past Surgical History:  Procedure Laterality Date   COLONOSCOPY N/A 12/21/2016   Procedure: COLONOSCOPY;  Surgeon: Rogene Houston, MD;  Location: [facility];  Service: Endoscopy;  Laterality: N/A;  90   NO PAST SURGERIES       No Known Allergies    Family History  Problem Relation Age of Onset   Breast cancer Mother    Heart attack Father    Colon cancer Neg Hx      Social History Ms. Kozlowski reports that she has never smoked. She has never used smokeless tobacco. Ms. Swickard reports no history of alcohol use.   Review of Systems CONSTITUTIONAL: No weight loss, fever, chills, weakness or fatigue.  HEENT: Eyes: No visual loss, blurred vision, double vision or yellow sclerae.No hearing loss, sneezing,  congestion, runny nose or sore throat.  SKIN: No rash or itching.  CARDIOVASCULAR: per hpi RESPIRATORY: No shortness of breath, cough or sputum.  GASTROINTESTINAL: No anorexia, nausea, vomiting or diarrhea. No abdominal pain or blood.  GENITOURINARY: No burning on urination, no polyuria NEUROLOGICAL: No headache, dizziness, syncope, paralysis, ataxia, numbness or tingling in the extremities. No change in bowel or bladder control.  MUSCULOSKELETAL: +hip pain LYMPHATICS: No enlarged nodes. No history of splenectomy.  PSYCHIATRIC: No history of depression or anxiety.  ENDOCRINOLOGIC: No reports of sweating, cold or heat intolerance. No polyuria or polydipsia.  Marland Kitchen   Physical Examination Today's Vitals   08/23/22 1122 08/23/22 1130  BP: (!) 154/90 (!) 160/90  Pulse: 84   SpO2: 95%   Weight: 183  lb 12.8 oz (83.4 kg)   Height: '4\' 9"'$  (1.448 m)    Body mass index is 39.77 kg/m.  Gen: resting comfortably, no acute distress HEENT: no scleral icterus, pupils equal round and reactive, no palptable cervical adenopathy,  CV: RRR, no m/r/ gno jvd Resp: Clear to auscultation bilaterally GI: abdomen is soft, non-tender, non-distended, normal bowel sounds, no hepatosplenomegaly MSK: extremities are warm, no edema.  Skin: warm, no rash Neuro:  no focal deficits Psych: appropriate affect   Diagnostic Studies     Assessment and Plan  1.Chronic diastolic HF - euvolemic today, continue current dose of lasix   2. HTN -history of white coat HTN, bp's elevated here but home numbers overall at goal similar ot her prior pattern. Continue current meds    3. Hyperlipidemia -at goal, continue current meds  EKG today shows NSR  F/u 6 months   Arnoldo Lenis, M.D

## 2022-08-24 ENCOUNTER — Other Ambulatory Visit (HOSPITAL_COMMUNITY): Payer: Self-pay | Admitting: Neurosurgery

## 2022-08-24 DIAGNOSIS — M5416 Radiculopathy, lumbar region: Secondary | ICD-10-CM

## 2022-08-29 DIAGNOSIS — M5416 Radiculopathy, lumbar region: Secondary | ICD-10-CM | POA: Diagnosis not present

## 2022-08-31 DIAGNOSIS — R0902 Hypoxemia: Secondary | ICD-10-CM | POA: Diagnosis not present

## 2022-08-31 DIAGNOSIS — G4733 Obstructive sleep apnea (adult) (pediatric): Secondary | ICD-10-CM | POA: Diagnosis not present

## 2022-09-01 DIAGNOSIS — G4733 Obstructive sleep apnea (adult) (pediatric): Secondary | ICD-10-CM | POA: Diagnosis not present

## 2022-09-04 ENCOUNTER — Ambulatory Visit (HOSPITAL_COMMUNITY)
Admission: RE | Admit: 2022-09-04 | Discharge: 2022-09-04 | Disposition: A | Discharge status: Home / Self Care | Payer: Medicare HMO | Source: Ambulatory Visit | Attending: Neurosurgery | Admitting: Neurosurgery

## 2022-09-04 DIAGNOSIS — M5416 Radiculopathy, lumbar region: Secondary | ICD-10-CM | POA: Insufficient documentation

## 2022-09-04 DIAGNOSIS — M5136 Other intervertebral disc degeneration, lumbar region: Secondary | ICD-10-CM | POA: Diagnosis not present

## 2022-09-04 DIAGNOSIS — M545 Low back pain, unspecified: Secondary | ICD-10-CM | POA: Diagnosis not present

## 2022-09-04 DIAGNOSIS — S32020A Wedge compression fracture of second lumbar vertebra, initial encounter for closed fracture: Secondary | ICD-10-CM | POA: Diagnosis not present

## 2022-09-15 ENCOUNTER — Other Ambulatory Visit (HOSPITAL_COMMUNITY): Payer: Self-pay | Admitting: Neurosurgery

## 2022-09-15 DIAGNOSIS — Z6838 Body mass index (BMI) 38.0-38.9, adult: Secondary | ICD-10-CM | POA: Diagnosis not present

## 2022-09-15 DIAGNOSIS — S32020D Wedge compression fracture of second lumbar vertebra, subsequent encounter for fracture with routine healing: Secondary | ICD-10-CM

## 2022-09-15 DIAGNOSIS — M5416 Radiculopathy, lumbar region: Secondary | ICD-10-CM | POA: Diagnosis not present

## 2022-09-27 ENCOUNTER — Ambulatory Visit (HOSPITAL_COMMUNITY)
Admission: RE | Admit: 2022-09-27 | Discharge: 2022-09-27 | Disposition: A | Discharge status: Home / Self Care | Payer: Medicare HMO | Source: Ambulatory Visit | Attending: Neurosurgery | Admitting: Neurosurgery

## 2022-09-27 DIAGNOSIS — M810 Age-related osteoporosis without current pathological fracture: Secondary | ICD-10-CM | POA: Insufficient documentation

## 2022-09-27 DIAGNOSIS — X58XXXD Exposure to other specified factors, subsequent encounter: Secondary | ICD-10-CM | POA: Insufficient documentation

## 2022-09-27 DIAGNOSIS — S32020D Wedge compression fracture of second lumbar vertebra, subsequent encounter for fracture with routine healing: Secondary | ICD-10-CM | POA: Diagnosis not present

## 2022-09-27 DIAGNOSIS — Z780 Asymptomatic menopausal state: Secondary | ICD-10-CM | POA: Diagnosis not present

## 2022-09-28 DIAGNOSIS — M9905 Segmental and somatic dysfunction of pelvic region: Secondary | ICD-10-CM | POA: Diagnosis not present

## 2022-09-28 DIAGNOSIS — M9903 Segmental and somatic dysfunction of lumbar region: Secondary | ICD-10-CM | POA: Diagnosis not present

## 2022-09-28 DIAGNOSIS — M5442 Lumbago with sciatica, left side: Secondary | ICD-10-CM | POA: Diagnosis not present

## 2022-09-28 DIAGNOSIS — M9902 Segmental and somatic dysfunction of thoracic region: Secondary | ICD-10-CM | POA: Diagnosis not present

## 2022-10-01 DIAGNOSIS — G4733 Obstructive sleep apnea (adult) (pediatric): Secondary | ICD-10-CM | POA: Diagnosis not present

## 2022-10-01 DIAGNOSIS — R0902 Hypoxemia: Secondary | ICD-10-CM | POA: Diagnosis not present

## 2022-10-06 DIAGNOSIS — M9905 Segmental and somatic dysfunction of pelvic region: Secondary | ICD-10-CM | POA: Diagnosis not present

## 2022-10-06 DIAGNOSIS — M5442 Lumbago with sciatica, left side: Secondary | ICD-10-CM | POA: Diagnosis not present

## 2022-10-06 DIAGNOSIS — M9903 Segmental and somatic dysfunction of lumbar region: Secondary | ICD-10-CM | POA: Diagnosis not present

## 2022-10-06 DIAGNOSIS — M9902 Segmental and somatic dysfunction of thoracic region: Secondary | ICD-10-CM | POA: Diagnosis not present

## 2022-10-11 DIAGNOSIS — M9905 Segmental and somatic dysfunction of pelvic region: Secondary | ICD-10-CM | POA: Diagnosis not present

## 2022-10-11 DIAGNOSIS — M9902 Segmental and somatic dysfunction of thoracic region: Secondary | ICD-10-CM | POA: Diagnosis not present

## 2022-10-11 DIAGNOSIS — M9903 Segmental and somatic dysfunction of lumbar region: Secondary | ICD-10-CM | POA: Diagnosis not present

## 2022-10-11 DIAGNOSIS — M5442 Lumbago with sciatica, left side: Secondary | ICD-10-CM | POA: Diagnosis not present

## 2022-10-13 DIAGNOSIS — M9905 Segmental and somatic dysfunction of pelvic region: Secondary | ICD-10-CM | POA: Diagnosis not present

## 2022-10-13 DIAGNOSIS — M5442 Lumbago with sciatica, left side: Secondary | ICD-10-CM | POA: Diagnosis not present

## 2022-10-13 DIAGNOSIS — M9903 Segmental and somatic dysfunction of lumbar region: Secondary | ICD-10-CM | POA: Diagnosis not present

## 2022-10-13 DIAGNOSIS — M9902 Segmental and somatic dysfunction of thoracic region: Secondary | ICD-10-CM | POA: Diagnosis not present

## 2022-10-18 DIAGNOSIS — M9902 Segmental and somatic dysfunction of thoracic region: Secondary | ICD-10-CM | POA: Diagnosis not present

## 2022-10-18 DIAGNOSIS — M5442 Lumbago with sciatica, left side: Secondary | ICD-10-CM | POA: Diagnosis not present

## 2022-10-18 DIAGNOSIS — M9903 Segmental and somatic dysfunction of lumbar region: Secondary | ICD-10-CM | POA: Diagnosis not present

## 2022-10-18 DIAGNOSIS — M9905 Segmental and somatic dysfunction of pelvic region: Secondary | ICD-10-CM | POA: Diagnosis not present

## 2022-10-20 DIAGNOSIS — M9905 Segmental and somatic dysfunction of pelvic region: Secondary | ICD-10-CM | POA: Diagnosis not present

## 2022-10-20 DIAGNOSIS — M5442 Lumbago with sciatica, left side: Secondary | ICD-10-CM | POA: Diagnosis not present

## 2022-10-20 DIAGNOSIS — M9902 Segmental and somatic dysfunction of thoracic region: Secondary | ICD-10-CM | POA: Diagnosis not present

## 2022-10-20 DIAGNOSIS — M9903 Segmental and somatic dysfunction of lumbar region: Secondary | ICD-10-CM | POA: Diagnosis not present

## 2022-10-25 DIAGNOSIS — M9903 Segmental and somatic dysfunction of lumbar region: Secondary | ICD-10-CM | POA: Diagnosis not present

## 2022-10-25 DIAGNOSIS — M5442 Lumbago with sciatica, left side: Secondary | ICD-10-CM | POA: Diagnosis not present

## 2022-10-25 DIAGNOSIS — M9902 Segmental and somatic dysfunction of thoracic region: Secondary | ICD-10-CM | POA: Diagnosis not present

## 2022-10-25 DIAGNOSIS — M9905 Segmental and somatic dysfunction of pelvic region: Secondary | ICD-10-CM | POA: Diagnosis not present

## 2022-10-26 DIAGNOSIS — G4733 Obstructive sleep apnea (adult) (pediatric): Secondary | ICD-10-CM | POA: Diagnosis not present

## 2022-11-01 DIAGNOSIS — M9903 Segmental and somatic dysfunction of lumbar region: Secondary | ICD-10-CM | POA: Diagnosis not present

## 2022-11-01 DIAGNOSIS — M5442 Lumbago with sciatica, left side: Secondary | ICD-10-CM | POA: Diagnosis not present

## 2022-11-01 DIAGNOSIS — M9902 Segmental and somatic dysfunction of thoracic region: Secondary | ICD-10-CM | POA: Diagnosis not present

## 2022-11-01 DIAGNOSIS — M9905 Segmental and somatic dysfunction of pelvic region: Secondary | ICD-10-CM | POA: Diagnosis not present

## 2022-11-09 DIAGNOSIS — Z0100 Encounter for examination of eyes and vision without abnormal findings: Secondary | ICD-10-CM | POA: Diagnosis not present

## 2022-11-09 DIAGNOSIS — H5213 Myopia, bilateral: Secondary | ICD-10-CM | POA: Diagnosis not present

## 2022-11-17 DIAGNOSIS — Z6837 Body mass index (BMI) 37.0-37.9, adult: Secondary | ICD-10-CM | POA: Diagnosis not present

## 2022-11-17 DIAGNOSIS — S32020D Wedge compression fracture of second lumbar vertebra, subsequent encounter for fracture with routine healing: Secondary | ICD-10-CM | POA: Diagnosis not present

## 2022-11-17 DIAGNOSIS — M25562 Pain in left knee: Secondary | ICD-10-CM | POA: Diagnosis not present

## 2022-12-01 DIAGNOSIS — G4733 Obstructive sleep apnea (adult) (pediatric): Secondary | ICD-10-CM | POA: Diagnosis not present

## 2022-12-14 DIAGNOSIS — Z0000 Encounter for general adult medical examination without abnormal findings: Secondary | ICD-10-CM | POA: Diagnosis not present

## 2022-12-14 DIAGNOSIS — R7303 Prediabetes: Secondary | ICD-10-CM | POA: Diagnosis not present

## 2022-12-14 DIAGNOSIS — I10 Essential (primary) hypertension: Secondary | ICD-10-CM | POA: Diagnosis not present

## 2022-12-18 DIAGNOSIS — Z0000 Encounter for general adult medical examination without abnormal findings: Secondary | ICD-10-CM | POA: Diagnosis not present

## 2022-12-19 DIAGNOSIS — G4730 Sleep apnea, unspecified: Secondary | ICD-10-CM | POA: Diagnosis not present

## 2022-12-19 DIAGNOSIS — E785 Hyperlipidemia, unspecified: Secondary | ICD-10-CM | POA: Diagnosis not present

## 2022-12-19 DIAGNOSIS — K219 Gastro-esophageal reflux disease without esophagitis: Secondary | ICD-10-CM | POA: Diagnosis not present

## 2022-12-19 DIAGNOSIS — I110 Hypertensive heart disease with heart failure: Secondary | ICD-10-CM | POA: Diagnosis not present

## 2022-12-19 DIAGNOSIS — I10 Essential (primary) hypertension: Secondary | ICD-10-CM | POA: Diagnosis not present

## 2022-12-19 DIAGNOSIS — I509 Heart failure, unspecified: Secondary | ICD-10-CM | POA: Diagnosis not present

## 2022-12-19 DIAGNOSIS — Z713 Dietary counseling and surveillance: Secondary | ICD-10-CM | POA: Diagnosis not present

## 2022-12-19 DIAGNOSIS — M25552 Pain in left hip: Secondary | ICD-10-CM | POA: Diagnosis not present

## 2022-12-19 DIAGNOSIS — E870 Hyperosmolality and hypernatremia: Secondary | ICD-10-CM | POA: Diagnosis not present

## 2022-12-19 DIAGNOSIS — R7303 Prediabetes: Secondary | ICD-10-CM | POA: Diagnosis not present

## 2022-12-19 DIAGNOSIS — R809 Proteinuria, unspecified: Secondary | ICD-10-CM | POA: Diagnosis not present

## 2022-12-20 DIAGNOSIS — S32020A Wedge compression fracture of second lumbar vertebra, initial encounter for closed fracture: Secondary | ICD-10-CM | POA: Diagnosis not present

## 2022-12-20 DIAGNOSIS — S32020D Wedge compression fracture of second lumbar vertebra, subsequent encounter for fracture with routine healing: Secondary | ICD-10-CM | POA: Diagnosis not present

## 2022-12-20 DIAGNOSIS — Z6838 Body mass index (BMI) 38.0-38.9, adult: Secondary | ICD-10-CM | POA: Diagnosis not present

## 2022-12-23 ENCOUNTER — Other Ambulatory Visit: Payer: Self-pay | Admitting: Cardiology

## 2022-12-28 ENCOUNTER — Other Ambulatory Visit (HOSPITAL_COMMUNITY): Payer: Self-pay | Admitting: Internal Medicine

## 2022-12-28 DIAGNOSIS — Z1231 Encounter for screening mammogram for malignant neoplasm of breast: Secondary | ICD-10-CM

## 2023-01-01 DIAGNOSIS — E785 Hyperlipidemia, unspecified: Secondary | ICD-10-CM | POA: Diagnosis not present

## 2023-01-01 DIAGNOSIS — R0902 Hypoxemia: Secondary | ICD-10-CM | POA: Diagnosis not present

## 2023-01-01 DIAGNOSIS — R32 Unspecified urinary incontinence: Secondary | ICD-10-CM | POA: Diagnosis not present

## 2023-01-01 DIAGNOSIS — M545 Low back pain, unspecified: Secondary | ICD-10-CM | POA: Diagnosis not present

## 2023-01-01 DIAGNOSIS — G4730 Sleep apnea, unspecified: Secondary | ICD-10-CM | POA: Diagnosis not present

## 2023-01-01 DIAGNOSIS — E1122 Type 2 diabetes mellitus with diabetic chronic kidney disease: Secondary | ICD-10-CM | POA: Diagnosis not present

## 2023-01-01 DIAGNOSIS — E1151 Type 2 diabetes mellitus with diabetic peripheral angiopathy without gangrene: Secondary | ICD-10-CM | POA: Diagnosis not present

## 2023-01-01 DIAGNOSIS — Z008 Encounter for other general examination: Secondary | ICD-10-CM | POA: Diagnosis not present

## 2023-01-01 DIAGNOSIS — M1990 Unspecified osteoarthritis, unspecified site: Secondary | ICD-10-CM | POA: Diagnosis not present

## 2023-01-01 DIAGNOSIS — N189 Chronic kidney disease, unspecified: Secondary | ICD-10-CM | POA: Diagnosis not present

## 2023-01-01 DIAGNOSIS — I130 Hypertensive heart and chronic kidney disease with heart failure and stage 1 through stage 4 chronic kidney disease, or unspecified chronic kidney disease: Secondary | ICD-10-CM | POA: Diagnosis not present

## 2023-01-01 DIAGNOSIS — K5900 Constipation, unspecified: Secondary | ICD-10-CM | POA: Diagnosis not present

## 2023-01-01 DIAGNOSIS — K219 Gastro-esophageal reflux disease without esophagitis: Secondary | ICD-10-CM | POA: Diagnosis not present

## 2023-01-08 ENCOUNTER — Encounter (HOSPITAL_COMMUNITY): Payer: Self-pay

## 2023-01-08 ENCOUNTER — Ambulatory Visit (HOSPITAL_COMMUNITY)
Admission: RE | Admit: 2023-01-08 | Discharge: 2023-01-08 | Disposition: A | Discharge status: Home / Self Care | Payer: Medicare HMO | Source: Ambulatory Visit | Attending: Internal Medicine | Admitting: Internal Medicine

## 2023-01-08 DIAGNOSIS — Z1231 Encounter for screening mammogram for malignant neoplasm of breast: Secondary | ICD-10-CM

## 2023-01-15 DIAGNOSIS — R2689 Other abnormalities of gait and mobility: Secondary | ICD-10-CM | POA: Diagnosis not present

## 2023-01-15 DIAGNOSIS — H6122 Impacted cerumen, left ear: Secondary | ICD-10-CM | POA: Diagnosis not present

## 2023-01-15 DIAGNOSIS — M810 Age-related osteoporosis without current pathological fracture: Secondary | ICD-10-CM | POA: Diagnosis not present

## 2023-01-22 ENCOUNTER — Other Ambulatory Visit: Payer: Self-pay

## 2023-01-22 DIAGNOSIS — M810 Age-related osteoporosis without current pathological fracture: Secondary | ICD-10-CM | POA: Insufficient documentation

## 2023-01-26 DIAGNOSIS — M9905 Segmental and somatic dysfunction of pelvic region: Secondary | ICD-10-CM | POA: Diagnosis not present

## 2023-01-26 DIAGNOSIS — M9903 Segmental and somatic dysfunction of lumbar region: Secondary | ICD-10-CM | POA: Diagnosis not present

## 2023-01-26 DIAGNOSIS — M9902 Segmental and somatic dysfunction of thoracic region: Secondary | ICD-10-CM | POA: Diagnosis not present

## 2023-01-26 DIAGNOSIS — M5441 Lumbago with sciatica, right side: Secondary | ICD-10-CM | POA: Diagnosis not present

## 2023-01-31 ENCOUNTER — Telehealth: Payer: Self-pay | Admitting: Pharmacy Technician

## 2023-01-31 DIAGNOSIS — M5441 Lumbago with sciatica, right side: Secondary | ICD-10-CM | POA: Diagnosis not present

## 2023-01-31 DIAGNOSIS — M9905 Segmental and somatic dysfunction of pelvic region: Secondary | ICD-10-CM | POA: Diagnosis not present

## 2023-01-31 DIAGNOSIS — M9902 Segmental and somatic dysfunction of thoracic region: Secondary | ICD-10-CM | POA: Diagnosis not present

## 2023-01-31 DIAGNOSIS — M9903 Segmental and somatic dysfunction of lumbar region: Secondary | ICD-10-CM | POA: Diagnosis not present

## 2023-01-31 NOTE — Telephone Encounter (Signed)
Auth Submission: APPROVED Site of care: Site of care: [facility] Payer: [employer] MEDICARE Medication & CPT/J Code(s) submitted: Prolia (Denosumab) [id number] Route of submission (phone, fax, portal):  Phone # Fax # Auth type: Buy/Bill Units/visits requested: 2 Reference number: [id number] Approval from: 01/30/23 to 01/30/24

## 2023-02-12 ENCOUNTER — Encounter (HOSPITAL_COMMUNITY)
Admission: RE | Admit: 2023-02-12 | Discharge: 2023-02-12 | Disposition: A | Discharge status: Home / Self Care | Payer: Medicare HMO | Source: Ambulatory Visit | Attending: Internal Medicine | Admitting: Internal Medicine

## 2023-02-12 VITALS — BP 159/88 | HR 87 | Temp 98.2°F | Resp 20

## 2023-02-12 DIAGNOSIS — I10 Essential (primary) hypertension: Secondary | ICD-10-CM | POA: Diagnosis not present

## 2023-02-12 DIAGNOSIS — B372 Candidiasis of skin and nail: Secondary | ICD-10-CM | POA: Insufficient documentation

## 2023-02-12 DIAGNOSIS — E785 Hyperlipidemia, unspecified: Secondary | ICD-10-CM | POA: Diagnosis not present

## 2023-02-12 DIAGNOSIS — S30861A Insect bite (nonvenomous) of abdominal wall, initial encounter: Secondary | ICD-10-CM | POA: Diagnosis not present

## 2023-02-12 DIAGNOSIS — W57XXXA Bitten or stung by nonvenomous insect and other nonvenomous arthropods, initial encounter: Secondary | ICD-10-CM | POA: Diagnosis not present

## 2023-02-12 DIAGNOSIS — M810 Age-related osteoporosis without current pathological fracture: Secondary | ICD-10-CM | POA: Diagnosis not present

## 2023-02-12 MED ORDER — DENOSUMAB 60 MG/ML ~~LOC~~ SOSY
60.0000 mg | PREFILLED_SYRINGE | Freq: Once | SUBCUTANEOUS | Status: AC
  Administered 2023-02-12: 60 mg via SUBCUTANEOUS

## 2023-02-12 NOTE — Progress Notes (Signed)
Diagnosis: Osteoporosis  Provider:  Dwana Melena MD  Procedure: Injection  Prolia (Denosumab), Dose: 60 mg, Site: subcutaneous, Number of injections: 1    Left arm    Post Care: Observation period completed  Discharge: Condition: Good, Destination: Home . AVS Provided  Performed by:  Marlow Baars [place], RN

## 2023-02-12 NOTE — Addendum Note (Signed)
Encounter addended by: Wyvonne Lenz, RN on: 02/12/2023 1:28 PM  Actions taken: Therapy plan modified

## 2023-02-14 ENCOUNTER — Ambulatory Visit: Payer: Medicare HMO | Admitting: Cardiology

## 2023-02-23 DIAGNOSIS — M9905 Segmental and somatic dysfunction of pelvic region: Secondary | ICD-10-CM | POA: Diagnosis not present

## 2023-02-23 DIAGNOSIS — M9902 Segmental and somatic dysfunction of thoracic region: Secondary | ICD-10-CM | POA: Diagnosis not present

## 2023-02-23 DIAGNOSIS — M9903 Segmental and somatic dysfunction of lumbar region: Secondary | ICD-10-CM | POA: Diagnosis not present

## 2023-02-23 DIAGNOSIS — M5441 Lumbago with sciatica, right side: Secondary | ICD-10-CM | POA: Diagnosis not present

## 2023-02-28 DIAGNOSIS — M9903 Segmental and somatic dysfunction of lumbar region: Secondary | ICD-10-CM | POA: Diagnosis not present

## 2023-02-28 DIAGNOSIS — M5441 Lumbago with sciatica, right side: Secondary | ICD-10-CM | POA: Diagnosis not present

## 2023-02-28 DIAGNOSIS — M9905 Segmental and somatic dysfunction of pelvic region: Secondary | ICD-10-CM | POA: Diagnosis not present

## 2023-02-28 DIAGNOSIS — M9902 Segmental and somatic dysfunction of thoracic region: Secondary | ICD-10-CM | POA: Diagnosis not present

## 2023-03-09 DIAGNOSIS — M9903 Segmental and somatic dysfunction of lumbar region: Secondary | ICD-10-CM | POA: Diagnosis not present

## 2023-03-09 DIAGNOSIS — M9902 Segmental and somatic dysfunction of thoracic region: Secondary | ICD-10-CM | POA: Diagnosis not present

## 2023-03-09 DIAGNOSIS — M5441 Lumbago with sciatica, right side: Secondary | ICD-10-CM | POA: Diagnosis not present

## 2023-03-09 DIAGNOSIS — M9905 Segmental and somatic dysfunction of pelvic region: Secondary | ICD-10-CM | POA: Diagnosis not present

## 2023-03-14 ENCOUNTER — Encounter (HOSPITAL_COMMUNITY): Payer: Self-pay | Admitting: Internal Medicine

## 2023-03-14 ENCOUNTER — Emergency Department (HOSPITAL_COMMUNITY): Payer: Medicare HMO

## 2023-03-14 ENCOUNTER — Encounter (HOSPITAL_COMMUNITY): Payer: Self-pay

## 2023-03-14 ENCOUNTER — Inpatient Hospital Stay (HOSPITAL_COMMUNITY)
Admission: EM | Admit: 2023-03-14 | Discharge: 2023-03-20 | DRG: 516 | Disposition: A | Discharge status: Skilled Nursing Facility | Payer: Medicare HMO | Attending: Internal Medicine | Admitting: Internal Medicine

## 2023-03-14 ENCOUNTER — Other Ambulatory Visit: Payer: Self-pay

## 2023-03-14 DIAGNOSIS — I5042 Chronic combined systolic (congestive) and diastolic (congestive) heart failure: Secondary | ICD-10-CM | POA: Diagnosis not present

## 2023-03-14 DIAGNOSIS — S3282XA Multiple fractures of pelvis without disruption of pelvic ring, initial encounter for closed fracture: Secondary | ICD-10-CM | POA: Diagnosis not present

## 2023-03-14 DIAGNOSIS — Z7982 Long term (current) use of aspirin: Secondary | ICD-10-CM

## 2023-03-14 DIAGNOSIS — I110 Hypertensive heart disease with heart failure: Secondary | ICD-10-CM | POA: Diagnosis not present

## 2023-03-14 DIAGNOSIS — E119 Type 2 diabetes mellitus without complications: Secondary | ICD-10-CM | POA: Diagnosis not present

## 2023-03-14 DIAGNOSIS — R531 Weakness: Secondary | ICD-10-CM | POA: Diagnosis not present

## 2023-03-14 DIAGNOSIS — K219 Gastro-esophageal reflux disease without esophagitis: Secondary | ICD-10-CM | POA: Diagnosis present

## 2023-03-14 DIAGNOSIS — M5441 Lumbago with sciatica, right side: Secondary | ICD-10-CM | POA: Diagnosis not present

## 2023-03-14 DIAGNOSIS — Z803 Family history of malignant neoplasm of breast: Secondary | ICD-10-CM

## 2023-03-14 DIAGNOSIS — D62 Acute posthemorrhagic anemia: Secondary | ICD-10-CM | POA: Diagnosis not present

## 2023-03-14 DIAGNOSIS — I2510 Atherosclerotic heart disease of native coronary artery without angina pectoris: Secondary | ICD-10-CM | POA: Diagnosis present

## 2023-03-14 DIAGNOSIS — S50312A Abrasion of left elbow, initial encounter: Secondary | ICD-10-CM | POA: Diagnosis present

## 2023-03-14 DIAGNOSIS — J9611 Chronic respiratory failure with hypoxia: Secondary | ICD-10-CM | POA: Diagnosis present

## 2023-03-14 DIAGNOSIS — Z8249 Family history of ischemic heart disease and other diseases of the circulatory system: Secondary | ICD-10-CM | POA: Diagnosis not present

## 2023-03-14 DIAGNOSIS — J45909 Unspecified asthma, uncomplicated: Secondary | ICD-10-CM | POA: Diagnosis present

## 2023-03-14 DIAGNOSIS — J849 Interstitial pulmonary disease, unspecified: Secondary | ICD-10-CM | POA: Diagnosis not present

## 2023-03-14 DIAGNOSIS — W010XXA Fall on same level from slipping, tripping and stumbling without subsequent striking against object, initial encounter: Secondary | ICD-10-CM | POA: Diagnosis not present

## 2023-03-14 DIAGNOSIS — Z79899 Other long term (current) drug therapy: Secondary | ICD-10-CM

## 2023-03-14 DIAGNOSIS — R000 Tachycardia, unspecified: Secondary | ICD-10-CM | POA: Diagnosis not present

## 2023-03-14 DIAGNOSIS — S32592A Other specified fracture of left pubis, initial encounter for closed fracture: Secondary | ICD-10-CM | POA: Diagnosis not present

## 2023-03-14 DIAGNOSIS — S32452A Displaced transverse fracture of left acetabulum, initial encounter for closed fracture: Secondary | ICD-10-CM | POA: Diagnosis not present

## 2023-03-14 DIAGNOSIS — I10 Essential (primary) hypertension: Secondary | ICD-10-CM | POA: Diagnosis not present

## 2023-03-14 DIAGNOSIS — I5022 Chronic systolic (congestive) heart failure: Secondary | ICD-10-CM | POA: Diagnosis present

## 2023-03-14 DIAGNOSIS — E1165 Type 2 diabetes mellitus with hyperglycemia: Secondary | ICD-10-CM | POA: Diagnosis not present

## 2023-03-14 DIAGNOSIS — S32455A Nondisplaced transverse fracture of left acetabulum, initial encounter for closed fracture: Secondary | ICD-10-CM | POA: Diagnosis not present

## 2023-03-14 DIAGNOSIS — Y9301 Activity, walking, marching and hiking: Secondary | ICD-10-CM | POA: Diagnosis not present

## 2023-03-14 DIAGNOSIS — Z01818 Encounter for other preprocedural examination: Secondary | ICD-10-CM | POA: Diagnosis not present

## 2023-03-14 DIAGNOSIS — E7849 Other hyperlipidemia: Secondary | ICD-10-CM | POA: Diagnosis not present

## 2023-03-14 DIAGNOSIS — G4733 Obstructive sleep apnea (adult) (pediatric): Secondary | ICD-10-CM | POA: Diagnosis present

## 2023-03-14 DIAGNOSIS — S32402A Unspecified fracture of left acetabulum, initial encounter for closed fracture: Secondary | ICD-10-CM

## 2023-03-14 DIAGNOSIS — Z8616 Personal history of COVID-19: Secondary | ICD-10-CM | POA: Diagnosis not present

## 2023-03-14 DIAGNOSIS — E559 Vitamin D deficiency, unspecified: Secondary | ICD-10-CM | POA: Diagnosis present

## 2023-03-14 DIAGNOSIS — Z6835 Body mass index (BMI) 35.0-35.9, adult: Secondary | ICD-10-CM

## 2023-03-14 DIAGNOSIS — E785 Hyperlipidemia, unspecified: Secondary | ICD-10-CM | POA: Diagnosis present

## 2023-03-14 DIAGNOSIS — I509 Heart failure, unspecified: Secondary | ICD-10-CM | POA: Diagnosis not present

## 2023-03-14 DIAGNOSIS — M9902 Segmental and somatic dysfunction of thoracic region: Secondary | ICD-10-CM | POA: Diagnosis not present

## 2023-03-14 DIAGNOSIS — E1169 Type 2 diabetes mellitus with other specified complication: Secondary | ICD-10-CM | POA: Diagnosis not present

## 2023-03-14 DIAGNOSIS — S32302A Unspecified fracture of left ilium, initial encounter for closed fracture: Secondary | ICD-10-CM | POA: Diagnosis present

## 2023-03-14 DIAGNOSIS — S32414A Nondisplaced fracture of anterior wall of right acetabulum, initial encounter for closed fracture: Secondary | ICD-10-CM | POA: Diagnosis not present

## 2023-03-14 DIAGNOSIS — M9903 Segmental and somatic dysfunction of lumbar region: Secondary | ICD-10-CM | POA: Diagnosis not present

## 2023-03-14 DIAGNOSIS — I5032 Chronic diastolic (congestive) heart failure: Secondary | ICD-10-CM | POA: Diagnosis not present

## 2023-03-14 DIAGNOSIS — G4730 Sleep apnea, unspecified: Secondary | ICD-10-CM | POA: Diagnosis present

## 2023-03-14 DIAGNOSIS — S32402D Unspecified fracture of left acetabulum, subsequent encounter for fracture with routine healing: Secondary | ICD-10-CM | POA: Diagnosis not present

## 2023-03-14 DIAGNOSIS — W19XXXA Unspecified fall, initial encounter: Secondary | ICD-10-CM | POA: Diagnosis not present

## 2023-03-14 DIAGNOSIS — L299 Pruritus, unspecified: Secondary | ICD-10-CM | POA: Diagnosis present

## 2023-03-14 DIAGNOSIS — S32409A Unspecified fracture of unspecified acetabulum, initial encounter for closed fracture: Secondary | ICD-10-CM | POA: Diagnosis present

## 2023-03-14 DIAGNOSIS — M9905 Segmental and somatic dysfunction of pelvic region: Secondary | ICD-10-CM | POA: Diagnosis not present

## 2023-03-14 DIAGNOSIS — M25552 Pain in left hip: Secondary | ICD-10-CM | POA: Diagnosis not present

## 2023-03-14 DIAGNOSIS — S32442A Displaced fracture of posterior column [ilioischial] of left acetabulum, initial encounter for closed fracture: Secondary | ICD-10-CM | POA: Diagnosis not present

## 2023-03-14 DIAGNOSIS — J9811 Atelectasis: Secondary | ICD-10-CM | POA: Diagnosis not present

## 2023-03-14 DIAGNOSIS — Z743 Need for continuous supervision: Secondary | ICD-10-CM | POA: Diagnosis not present

## 2023-03-14 LAB — GLUCOSE, CAPILLARY: Glucose-Capillary: 170 mg/dL — ABNORMAL HIGH (ref 70–99)

## 2023-03-14 LAB — CBC WITH DIFFERENTIAL/PLATELET
Abs Immature Granulocytes: 0.06 10*3/uL (ref 0.00–0.07)
Basophils Absolute: 0.1 10*3/uL (ref 0.0–0.1)
Basophils Relative: 1 %
Eosinophils Absolute: 0.1 10*3/uL (ref 0.0–0.5)
Eosinophils Relative: 1 %
HCT: 49.2 % — ABNORMAL HIGH (ref 36.0–46.0)
Hemoglobin: 15.2 g/dL — ABNORMAL HIGH (ref 12.0–15.0)
Immature Granulocytes: 1 %
Lymphocytes Relative: 17 %
Lymphs Abs: 1.5 10*3/uL (ref 0.7–4.0)
MCH: 28.5 pg (ref 26.0–34.0)
MCHC: 30.9 g/dL (ref 30.0–36.0)
MCV: 92.3 fL (ref 80.0–100.0)
Monocytes Absolute: 0.6 10*3/uL (ref 0.1–1.0)
Monocytes Relative: 7 %
Neutro Abs: 6.6 10*3/uL (ref 1.7–7.7)
Neutrophils Relative %: 73 %
Platelets: 221 10*3/uL (ref 150–400)
RBC: 5.33 MIL/uL — ABNORMAL HIGH (ref 3.87–5.11)
RDW: 14.1 % (ref 11.5–15.5)
WBC: 8.9 10*3/uL (ref 4.0–10.5)
nRBC: 0 % (ref 0.0–0.2)

## 2023-03-14 LAB — HEPATIC FUNCTION PANEL
ALT: 17 U/L (ref 0–44)
AST: 30 U/L (ref 15–41)
Albumin: 3.6 g/dL (ref 3.5–5.0)
Alkaline Phosphatase: 69 U/L (ref 38–126)
Bilirubin, Direct: 0.2 mg/dL (ref 0.0–0.2)
Indirect Bilirubin: 0.5 mg/dL (ref 0.3–0.9)
Total Bilirubin: 0.7 mg/dL (ref 0.3–1.2)
Total Protein: 6.6 g/dL (ref 6.5–8.1)

## 2023-03-14 LAB — BASIC METABOLIC PANEL
Anion gap: 10 (ref 5–15)
BUN: 13 mg/dL (ref 8–23)
CO2: 32 mmol/L (ref 22–32)
Calcium: 8.8 mg/dL — ABNORMAL LOW (ref 8.9–10.3)
Chloride: 95 mmol/L — ABNORMAL LOW (ref 98–111)
Creatinine, Ser: 0.66 mg/dL (ref 0.44–1.00)
GFR, Estimated: >60 mL/min (ref >60)
Glucose, Bld: 165 mg/dL — ABNORMAL HIGH (ref 70–99)
Potassium: 3.5 mmol/L (ref 3.5–5.1)
Sodium: 137 mmol/L (ref 135–145)

## 2023-03-14 LAB — TROPONIN I (HIGH SENSITIVITY): Troponin I (High Sensitivity): 8 ng/L (ref <18)

## 2023-03-14 LAB — PROTIME-INR
INR: 0.9 (ref 0.8–1.2)
Prothrombin Time: 12.8 seconds (ref 11.4–15.2)

## 2023-03-14 LAB — MAGNESIUM: Magnesium: 1.9 mg/dL (ref 1.7–2.4)

## 2023-03-14 LAB — CBG MONITORING, ED: Glucose-Capillary: 241 mg/dL — ABNORMAL HIGH (ref 70–99)

## 2023-03-14 LAB — CK: Total CK: 95 U/L (ref 38–234)

## 2023-03-14 LAB — VITAMIN D 25 HYDROXY (VIT D DEFICIENCY, FRACTURES): Vit D, 25-Hydroxy: 29.31 ng/mL — ABNORMAL LOW (ref 30–100)

## 2023-03-14 LAB — TSH: TSH: 0.56 u[IU]/mL (ref 0.350–4.500)

## 2023-03-14 LAB — HEMOGLOBIN A1C
Hgb A1c MFr Bld: 5.7 % — ABNORMAL HIGH (ref 4.8–5.6)
Mean Plasma Glucose: 116.89 mg/dL

## 2023-03-14 LAB — TYPE AND SCREEN
ABO/RH(D): O POS
Antibody Screen: NEGATIVE

## 2023-03-14 LAB — PHOSPHORUS: Phosphorus: 3.2 mg/dL (ref 2.5–4.6)

## 2023-03-14 MED ORDER — HYDROCODONE-ACETAMINOPHEN 5-325 MG PO TABS
1.0000 | ORAL_TABLET | Freq: Four times a day (QID) | ORAL | Status: DC | PRN
  Administered 2023-03-14 – 2023-03-16 (×4): 2 via ORAL
  Administered 2023-03-17 – 2023-03-19 (×6): 1 via ORAL
  Filled 2023-03-14: qty 2
  Filled 2023-03-14 (×2): qty 1
  Filled 2023-03-14: qty 2
  Filled 2023-03-14 (×3): qty 1
  Filled 2023-03-14: qty 2
  Filled 2023-03-14: qty 1
  Filled 2023-03-14: qty 2

## 2023-03-14 MED ORDER — MORPHINE SULFATE (PF) 2 MG/ML IV SOLN
0.5000 mg | INTRAVENOUS | Status: DC | PRN
  Administered 2023-03-16: 0.5 mg via INTRAVENOUS
  Filled 2023-03-14: qty 1

## 2023-03-14 MED ORDER — CARVEDILOL 6.25 MG PO TABS
6.2500 mg | ORAL_TABLET | Freq: Two times a day (BID) | ORAL | Status: DC
  Administered 2023-03-14 – 2023-03-20 (×12): 6.25 mg via ORAL
  Filled 2023-03-14 (×8): qty 1
  Filled 2023-03-14: qty 2
  Filled 2023-03-14 (×3): qty 1

## 2023-03-14 MED ORDER — METHOCARBAMOL 500 MG PO TABS
500.0000 mg | ORAL_TABLET | Freq: Four times a day (QID) | ORAL | Status: DC | PRN
  Administered 2023-03-14 – 2023-03-20 (×6): 500 mg via ORAL
  Filled 2023-03-14 (×6): qty 1

## 2023-03-14 MED ORDER — METHOCARBAMOL 1000 MG/10ML IJ SOLN: 500.0000 mg | Freq: Four times a day (QID) | INTRAVENOUS | Status: DC | PRN

## 2023-03-14 MED ORDER — ROSUVASTATIN CALCIUM 5 MG PO TABS
10.0000 mg | ORAL_TABLET | Freq: Every day | ORAL | Status: DC
  Administered 2023-03-15 – 2023-03-20 (×5): 10 mg via ORAL
  Filled 2023-03-14 (×5): qty 2

## 2023-03-14 MED ORDER — PANTOPRAZOLE SODIUM 40 MG PO TBEC
40.0000 mg | DELAYED_RELEASE_TABLET | Freq: Every day | ORAL | Status: DC
  Administered 2023-03-15 – 2023-03-20 (×5): 40 mg via ORAL
  Filled 2023-03-14 (×5): qty 1

## 2023-03-14 MED ORDER — INSULIN ASPART 100 UNIT/ML IJ SOLN
0.0000 [IU] | INTRAMUSCULAR | Status: DC
  Administered 2023-03-14: 3 [IU] via SUBCUTANEOUS
  Administered 2023-03-15: 2 [IU] via SUBCUTANEOUS
  Administered 2023-03-15 (×3): 1 [IU] via SUBCUTANEOUS
  Administered 2023-03-15 – 2023-03-16 (×3): 2 [IU] via SUBCUTANEOUS
  Administered 2023-03-16: 1 [IU] via SUBCUTANEOUS
  Administered 2023-03-16: 2 [IU] via SUBCUTANEOUS
  Administered 2023-03-17 (×3): 1 [IU] via SUBCUTANEOUS
  Administered 2023-03-17: 2 [IU] via SUBCUTANEOUS
  Administered 2023-03-18: 1 [IU] via SUBCUTANEOUS
  Administered 2023-03-18: 0.2 [IU] via SUBCUTANEOUS
  Administered 2023-03-18 (×2): 1 [IU] via SUBCUTANEOUS
  Administered 2023-03-18: 2 [IU] via SUBCUTANEOUS
  Administered 2023-03-19 (×2): 1 [IU] via SUBCUTANEOUS
  Administered 2023-03-19: 2 [IU] via SUBCUTANEOUS
  Administered 2023-03-19 – 2023-03-20 (×3): 1 [IU] via SUBCUTANEOUS

## 2023-03-14 MED ORDER — HYDROMORPHONE HCL 1 MG/ML IJ SOLN
0.5000 mg | Freq: Once | INTRAMUSCULAR | Status: AC
  Administered 2023-03-14: 0.5 mg via INTRAVENOUS
  Filled 2023-03-14: qty 1

## 2023-03-14 MED ORDER — ALBUTEROL SULFATE (2.5 MG/3ML) 0.083% IN NEBU
2.5000 mg | INHALATION_SOLUTION | Freq: Four times a day (QID) | RESPIRATORY_TRACT | Status: DC | PRN
  Administered 2023-03-15: 2.5 mg via RESPIRATORY_TRACT
  Filled 2023-03-14: qty 3

## 2023-03-14 NOTE — ED Triage Notes (Signed)
Pt BIB [facility] d/t a fall. Pt missed a step while walking which resulted in a fall. Pt fell and landed on her R side. C/o R hip pain. No deformities noted. Pulse intact. A&O X4. VSS.

## 2023-03-14 NOTE — Subjective & Objective (Addendum)
Came in with a fall mechanical fall patient missed a step while walking and fell down landed on the left side complain of severe left hip pain was not able to bear weight Denies hitting her head scraped left elbow EMS was called Patient has known history of of heart failure and diabetes as well as past history of ARDS ablations  secondary to COVID

## 2023-03-14 NOTE — ED Notes (Signed)
ED TO INPATIENT HANDOFF REPORT  ED Nurse Name and Phone #: Grover Canavan 40  S Name/Age/Gender Kristina [name] 72 y.o. female Room/Bed: 019C/019C  Code Status   Code Status: Prior  Home/SNF/Other Home Patient oriented to: self, place, time, and situation Is this baseline? Yes   Triage Complete: Triage complete  Chief Complaint Acetabular fracture (HCC) [S32.409A]  Triage Note Pt BIB [facility] d/t a fall. Pt missed a step while walking which resulted in a fall. Pt fell and landed on her R side. C/o R hip pain. No deformities noted. Pulse intact. A&O X4. VSS.    Allergies No Known Allergies  Level of Care/Admitting Diagnosis ED Disposition   ED Disposition: Admit Condition: None Comment: Hospital Area: [facility] [[id number]]  Level of Care: Telemetry Medical [104]  May admit patient to [facility] or [facility] if equivalent level of care is available:: No  Covid Evaluation: Asymptomatic - no recent exposure (last 10 days) testing not required  Diagnosis: Acetabular fracture (HCC) [343000]  Admitting Physician: Therisa Doyne [[id number]]  Attending Physician: Therisa Doyne [[id number]]  Certification:: I certify this patient will need inpatient services for at least 2 midnights  Estimated Length of Stay: 2      B Medical/Surgery History Past Medical History:  Diagnosis Date   Acute respiratory distress syndrome (ARDS) due to COVID-19 virus (HCC) 09/2019   Chronic combined systolic and diastolic CHF (congestive heart failure) (HCC)    Diabetes mellitus, type 2 (HCC)    GERD (gastroesophageal reflux disease)    Hyperlipidemia    Hypertension    Past Surgical History:  Procedure Laterality Date   COLONOSCOPY N/A 12/21/2016   Procedure: COLONOSCOPY;  Surgeon: Malissa Hippo, MD;  Location: [facility];  Service: Endoscopy;  Laterality: N/A;  930   NO PAST SURGERIES       A IV Location/Drains/Wounds Patient Lines/Drains/Airways Status     Active  Line/Drains/Airways     Name Placement date Placement time Site Days   Peripheral IV 03/14/23 20 G Anterior;Proximal;Right Forearm 03/14/23  no documentation  Forearm  less than 1            Intake/Output Last 24 hours No intake or output data in the 24 hours ending 03/14/23 2008  Labs/Imaging Results for orders placed or performed during the hospital encounter of 03/14/23 (from the past 48 hour(s))  Basic metabolic panel     Status: Abnormal   Collection Time: 03/14/23  1:40 PM  Result Value Ref Range   Sodium 137 135 - 145 mmol/L   Potassium 3.5 3.5 - 5.1 mmol/L   Chloride 95 (L) 98 - 111 mmol/L   CO2 32 22 - 32 mmol/L   Glucose, Bld 165 (H) 70 - 99 mg/dL    Comment: Glucose reference range applies only to samples taken after fasting for at least 8 hours.   BUN 13 8 - 23 mg/dL   Creatinine, Ser 1.61 0.44 - 1.00 mg/dL   Calcium 8.8 (L) 8.9 - 10.3 mg/dL   GFR, Estimated >09 >60 mL/min    Comment: (NOTE) Calculated using the CKD-EPI Creatinine Equation (2021)    Anion gap 10 5 - 15    Comment: Performed at [facility], [address] [place], [place] [address]  CBC with Differential     Status: Abnormal   Collection Time: 03/14/23  1:40 PM  Result Value Ref Range   WBC 8.9 4.0 - 10.5 K/uL   RBC 5.33 (H)  3.87 - 5.11 MIL/uL   Hemoglobin 15.2 (H) 12.0 - 15.0 g/dL   HCT 16.1 (H) 09.6 - 04.5 %   MCV 92.3 80.0 - 100.0 fL   MCH 28.5 26.0 - 34.0 pg   MCHC 30.9 30.0 - 36.0 g/dL   RDW 40.9 81.1 - 91.4 %   Platelets 221 150 - 400 K/uL   nRBC 0.0 0.0 - 0.2 %   Neutrophils Relative % 73 %   Neutro Abs 6.6 1.7 - 7.7 K/uL   Lymphocytes Relative 17 %   Lymphs Abs 1.5 0.7 - 4.0 K/uL   Monocytes Relative 7 %   Monocytes Absolute 0.6 0.1 - 1.0 K/uL   Eosinophils Relative 1 %   Eosinophils Absolute 0.1 0.0 - 0.5 K/uL   Basophils Relative 1 %   Basophils Absolute 0.1 0.0 - 0.1 K/uL   Immature Granulocytes 1 %   Abs Immature Granulocytes 0.06 0.00 - 0.07 K/uL    Comment:  Performed at [facility], [address], [place], [place] [address]  CBG monitoring, ED     Status: Abnormal   Collection Time: 03/14/23  8:00 PM  Result Value Ref Range   Glucose-Capillary 241 (H) 70 - 99 mg/dL    Comment: Glucose reference range applies only to samples taken after fasting for at least 8 hours.   DG Chest Port 1 View  Result Date: 03/14/2023 CLINICAL DATA:  Preop evaluation EXAM: PORTABLE CHEST 1 VIEW COMPARISON:  04/09/2021 FINDINGS: Transverse diameter of heart is increased. Low lung volumes are noted. Increased interstitial markings are seen in left lower lung field with no interval change. Left hemidiaphragm is elevated. There are no new infiltrates or signs of pulmonary edema. There is no pleural effusion or pneumothorax. IMPRESSION: Linear densities in left lower lung field may suggest scarring or subsegmental atelectasis. Low lung volumes. Left hemidiaphragm is elevated. There are no new infiltrates or signs of pulmonary edema. Electronically Signed   By: Ernie Avena M.D.   On: 03/14/2023 19:28   CT PELVIS WO CONTRAST  Result Date: 03/14/2023 CLINICAL DATA:  Hip fracture EXAM: CT PELVIS WITHOUT CONTRAST TECHNIQUE: Multidetector CT imaging of the pelvis was performed following the standard protocol without intravenous contrast. RADIATION DOSE REDUCTION: This exam was performed according to the departmental dose-optimization program which includes automated exposure control, adjustment of the mA and/or kV according to patient size and/or use of iterative reconstruction technique. COMPARISON:  X-ray 03/14/2023 and older FINDINGS: Urinary Tract: Preserved contours of the urinary bladder. Bladder is mildly distended. Presumed cysts involving the kidneys of the edge of the imaging field but incompletely evaluated. Please correlate with prior imaging of the area or if needed ultrasound. Bowel: No oral contrast. Stool in the rectum and sigmoid colon. Significant  diverticula of the sigmoid colon. Normal appendix in the right lower quadrant. The visualized small bowel is nondilated. Vascular/Lymphatic: Mild vascular calcifications along the aorta and iliac vessels. No specific abnormal lymph node enlargement identified in the visualized pelvis. Reproductive: Uterus is present. There is a lobular structure in the anterior left adnexa which appears separate from the left ovary. Paraovarian lesion or exophytic pedunculated fibroid is possible. Please correlate for any known history or dedicated workup when appropriate. The structure on series 3, image 24 measures 4.1 by 3.2 cm. Other: No free fluid in the pelvis. There is some hematoma along the extraperitoneal space on the left side in the low pelvis. Musculoskeletal: Acute appearing fracture involving the left inferior pubic ramus  which is comminuted. There also fracture lines along the anterior aspect of the left acetabulum. Comminuted fracture fragments extend along the medial portion of the acetabulum, posterior and superior. Fracture lines extend along the left side of the iliac bone inferiorly. Please correlate with the time course of injury. Mild degenerative changes of the visualized portions of the spine and sacroiliac joints. Sclerosis along the pubic symphysis. Focal asymmetric thickening of the left iliacus muscle and obturator internus. Possible associated hematoma. IMPRESSION: Comminuted fracture identified involving the left acetabulum with fracture lines diffusely along all aspects of the acetabulum. Extension of fracture lines along the inferior left iliac bone. Separate inferior pubic ramus fracture on the left. Surrounding associated hematoma in the extraperitoneal space and involving the musculature of the iliacus muscle and obturator internus. 4.1 cm soft tissue nodule in the left adnexa anterior to the left side of the uterus and ovary. Possibilities would include a paraovarian lesion versus an exophytic  uterine fibroid. Please correlate for any known history or dedicated workup when appropriate Electronically Signed   By: Karen Kays M.D.   On: 03/14/2023 16:07   DG Hip Unilat With Pelvis 2-3 Views Left  Result Date: 03/14/2023 CLINICAL DATA:  Trauma, fall EXAM: DG HIP (WITH OR WITHOUT PELVIS) 2-3V LEFT COMPARISON:  07/18/2022 FINDINGS: There is comminuted displaced fracture in left iliac bone along the medial aspect of left acetabulum. There is 8 mm offset in alignment of fracture fragments in the medial aspect of left iliac bone. There is radiolucent line in the lateral cortex of left iliac bone superior to the acetabulum. There is fracture in left inferior pubic ramus with 5 mm offset in alignment. As far as seen, no fractures are noted in proximal left femur. IMPRESSION: There is no demonstrable fracture in proximal left femur.There is recent comminuted displaced fracture in left iliac bone involving the medial aspect of left acetabulum. Fracture line is involving the lateral cortical margin of left iliac bone superior to the acetabulum. There is recent displaced fracture in the left inferior pubic ramus. Follow-up CT may be considered. Electronically Signed   By: Ernie Avena M.D.   On: 03/14/2023 14:58    Pending Labs Unresulted Labs (From admission, onward)     Start     Ordered   03/15/23 0500  Prealbumin  Tomorrow morning,   R        03/14/23 1945   03/14/23 1946  Hepatic function panel  Add-on,   AD       Question:  Release to patient  Answer:  Immediate   03/14/23 1945   03/14/23 1946  Magnesium  Add-on,   AD        03/14/23 1945   03/14/23 1946  Phosphorus  Add-on,   AD        03/14/23 1945   03/14/23 1946  CK  Add-on,   AD        03/14/23 1945   03/14/23 1946  Protime-INR  Once,   R       Question:  Release to patient  Answer:  Immediate   03/14/23 1945   03/14/23 1946  TSH  Add-on,   AD        03/14/23 1945   03/14/23 1946  VITAMIN D 25 Hydroxy (Vit-D Deficiency,  Fractures)  Add-on,   AD        03/14/23 1945   03/14/23 1944  Hemoglobin A1c  Once,   URGENT       Comments:  To assess prior glycemic control    03/14/23 1944            Vitals/Pain Today's Vitals   03/14/23 1343 03/14/23 1819 03/14/23 2002  BP: (Abnormal) 172/83 (Abnormal) 175/80   Pulse: 82 (Abnormal) 115   Resp: 17 18   Temp: 98.2 F (36.8 C) 98 F (36.7 C)   TempSrc: Oral Oral   SpO2: 100% (Abnormal) 85%   PainSc:   7     Isolation Precautions No active isolations  Medications Medications  insulin aspart (novoLOG) injection 0-9 Units (3 Units Subcutaneous Given 03/14/23 2006)  HYDROmorphone (DILAUDID) injection 0.5 mg (0.5 mg Intravenous Given 03/14/23 1450)    Mobility walks with device     Focused Assessments Musculskeletal   R Recommendations: See Admitting Provider Note  Report given to:   Additional Notes:

## 2023-03-14 NOTE — H&P (Signed)
Kristina [name] ZOX:[id number] DOB: 1951-02-25 DOA: 03/14/2023     PCP: Kristina Stabile, MD   Outpatient Specialists:  CARDS:   Dr. Dina Rich, MD    Patient arrived to ER on 03/14/23 at 1323 Referred by Attending Melene Plan, DO   Patient coming from:    home Lives  With family    Chief Complaint:   Chief Complaint  Patient presents with   Fall    HPI: Kristina [name] is a 72 y.o. female with medical history significant of CHF DM2 hx of ARDS after COVID    Presented with   fall Came in with a fall mechanical fall patient missed a step while walking and fell down landed on the left side complain of severe left hip pain was not able to bear weight Denies hitting her head scraped left elbow EMS was called Patient has known history of of heart failure and diabetes as well as past history of ARDS ablations  secondary to COVID     Denies significant ETOH intake   Does not smoke   Lab Results  Component Value Date   SARSCOV2NAA NEGATIVE 04/05/2021   SARSCOV2NAA POSITIVE (A) 10/02/2019   Regarding pertinent Chronic problems:     Hyperlipidemia -  on statins Crestor Lipid Panel     Component Value Date/Time   TRIG 427 (H) 10/06/2019 0440     HTN on coreg cozaar   chronic CHF diastolic/systolic/ combined - On LAsix  last echo  Recent Results (from the past 81191 hour(s))  ECHOCARDIOGRAM COMPLETE   Collection Time: 04/06/21 11:56 AM  Result Value   Weight 3,033.53   Height 59   BP 141/67   AR max vel 2.39   AV Area VTI 2.31   AV Mean grad 6.0   AV Peak grad 10.5   Ao pk vel 1.62   AV Area mean vel 2.38   MV VTI 3.90   Area-P 1/2 3.31   S' Lateral 2.81   Narrative      ECHOCARDIOGRAM REPORT       1. Left ventricular ejection fraction, by estimation, is 60 to 65%. The left ventricle has normal function. The left ventricle has no regional wall motion abnormalities. Left ventricular diastolic parameters are consistent with Grade I diastolic  dysfunction  (impaired relaxation).  2. Right ventricular systolic function is normal. The right ventricular size is normal. Tricuspid regurgitation signal is inadequate for assessing PA pressure.  3. The mitral valve is normal in structure. Mild mitral valve regurgitation. No evidence of mitral stenosis.  4. The aortic valve is tricuspid. There is mild calcification of the aortic valve. There is mild thickening of the aortic valve. Aortic valve regurgitation is not visualized. No aortic stenosis is present.  5. The inferior vena cava is normal in size with greater than 50% respiratory variability, suggesting right atrial pressure of 3 mmHg.      *Note: Due to a large number of results and/or encounters for the requested time period, some results have not been displayed. A complete set of results can be found in Results Review.      CAD  - On Aspirin, statin,                  -  followed by cardiology          Based on CT    DM 2 -  Lab Results  Component Value Date   HGBA1C 6.3 (H) 04/06/2021  PO meds only,        Morbid obesity-   BMI Readings from Last 1 Encounters:  08/23/22 39.77 kg/m       Asthma -well   controlled on home inhalers/ nebs                         Hx of ARDS now on O2 as needed      OSA - CPAP    While in ER: Clinical Course as of 03/14/23 1943  Wed Mar 14, 2023  1530 X-ray with some pelvic fractures around the left hip without sign of hip fracture, will obtain CT to further characterize and rule out occult hip fracture. Signed out to Dr. Adela Lank pending this.  [WS]    Clinical Course User Index [WS] Lonell Grandchild, MD       Lab Orders         Basic metabolic panel         CBC with Differential      CXR - Linear densities in left lower lung field may suggest scarring or subsegmental atelectasis. Low lung volumes. Left hemidiaphragm is elevated. There are no new infiltrates or signs of pulmonary edema.    CT pelvis - Comminuted fracture identified  involving the left acetabulum with fracture lines diffusely along all aspects of the acetabulum. Extension of fracture lines along the inferior left iliac bone. Separate inferior pubic ramus fracture on the left.   Surrounding associated hematoma in the extraperitoneal space and involving the musculature of the iliacus muscle and obturator internus.   4.1 cm soft tissue nodule in the left adnexa anterior to the left side of the uterus and ovary. Possibilities would include a paraovarian lesion versus an exophytic uterine fibroid. Please correlate for any known history or dedicated workup when appropriate    Following Medications were ordered in ER: Medications  HYDROmorphone (DILAUDID) injection 0.5 mg (0.5 mg Intravenous Given 03/14/23 1450)    _______________________________________________________ ER Provider Called:   orthopedics  Dr. Susa Simmonds They Recommend admit to medicine   Will see in AM      ED Triage Vitals [03/14/23 1343]  Enc Vitals Group     BP (!) 172/83     Pulse Rate 82     Resp 17     Temp 98.2 F (36.8 C)     Temp Source Oral     SpO2 100 %     Weight      Height      Head Circumference      Peak Flow      Pain Score      Pain Loc      Pain Edu?      Excl. in GC?   JYNW(29)@     _________________________________________ Significant initial  Findings: Abnormal Labs Reviewed  BASIC METABOLIC PANEL - Abnormal; Notable for the following components:      Result Value   Chloride 95 (*)    Glucose, Bld 165 (*)    Calcium 8.8 (*)    All other components within normal limits  CBC WITH DIFFERENTIAL/PLATELET - Abnormal; Notable for the following components:   RBC 5.33 (*)    Hemoglobin 15.2 (*)    HCT 49.2 (*)    All other components within normal limits      _________________________ Troponin  ordered Cardiac Panel (last 3 results) No results for input(s): "CKTOTAL", "CKMB", "TROPONINIHS", "RELINDX" in the last 72 hours.  ECG: Ordered Personally  reviewed and interpreted by me showing: HR : 110 Rhythm: Sinus tachycardia   no evidence of ischemic changes QTC 451    The recent clinical data is shown below. Vitals:   03/14/23 1343 03/14/23 1819  BP: (!) 172/83 (!) 175/80  Pulse: 82 (!) 115  Resp: 17 18  Temp: 98.2 F (36.8 C) 98 F (36.7 C)  TempSrc: Oral Oral  SpO2: 100% (!) 85%    WBC     Component Value Date/Time   WBC 8.9 03/14/2023 1340   LYMPHSABS 1.5 03/14/2023 1340   MONOABS 0.6 03/14/2023 1340   EOSABS 0.1 03/14/2023 1340   BASOSABS 0.1 03/14/2023 1340       UA  ordered       __________________________________________________________ Recent Labs  Lab 03/14/23 1340  NA 137  K 3.5  CO2 32  GLUCOSE 165*  BUN 13  CREATININE 0.66  CALCIUM 8.8*    Cr    stable,    Lab Results  Component Value Date   CREATININE 0.66 03/14/2023   CREATININE 0.68 04/09/2021   CREATININE 0.68 04/08/2021    No results for input(s): "AST", "ALT", "ALKPHOS", "BILITOT", "PROT", "ALBUMIN" in the last 168 hours. Lab Results  Component Value Date   CALCIUM 8.8 (L) 03/14/2023   PHOS 5.0 (H) 10/04/2019       Plt: Lab Results  Component Value Date   PLT 221 03/14/2023    Recent Labs  Lab 03/14/23 1340  WBC 8.9  NEUTROABS 6.6  HGB 15.2*  HCT 49.2*  MCV 92.3  PLT 221    HG/HCT   stable,      Component Value Date/Time   HGB 15.2 (H) 03/14/2023 1340   HCT 49.2 (H) 03/14/2023 1340   MCV 92.3 03/14/2023 1340    _______________________________________________ Hospitalist was called for admission for   Closed nondisplaced fracture of acetabulum, unspecified portion of acetabulum, unspecified laterality, initial encounter (HCC)    The following Work up has been ordered so far:  Orders Placed This Encounter  Procedures   DG Hip Unilat With Pelvis 2-3 Views Left   CT PELVIS WO CONTRAST   DG Chest Port 1 View   Basic metabolic panel   CBC with Differential   Diet NPO time specified   Consult to orthopedic  surgery   Consult for [facility] Medical Admission   EKG 12-Lead     OTHER Significant initial  Findings:  labs showing:     DM  labs:  HbA1C: No results for input(s): "HGBA1C" in the last 8760 hours.     CBG (last 3)  Recent Labs    03/14/23 2000  GLUCAP 241*          Cultures:    Component Value Date/Time   SDES BLOOD LEFT ARM 10/02/2019 1009   SPECREQUEST  10/02/2019 1009    BOTTLES DRAWN AEROBIC AND ANAEROBIC Blood Culture adequate volume   CULT  10/02/2019 1009    NO GROWTH 6 DAYS Performed at [facility], [address], [place], [place] [address]    REPTSTATUS 10/08/2019 FINAL 10/02/2019 1009     Radiological Exams on Admission: DG Chest Port 1 View  Result Date: 03/14/2023 CLINICAL DATA:  Preop evaluation EXAM: PORTABLE CHEST 1 VIEW COMPARISON:  04/09/2021 FINDINGS: Transverse diameter of heart is increased. Low lung volumes are noted. Increased interstitial markings are seen in left lower lung field with no interval change. Left hemidiaphragm is elevated. There are no new infiltrates or signs  of pulmonary edema. There is no pleural effusion or pneumothorax. IMPRESSION: Linear densities in left lower lung field may suggest scarring or subsegmental atelectasis. Low lung volumes. Left hemidiaphragm is elevated. There are no new infiltrates or signs of pulmonary edema. Electronically Signed   By: Ernie Avena M.D.   On: 03/14/2023 19:28   CT PELVIS WO CONTRAST  Result Date: 03/14/2023 CLINICAL DATA:  Hip fracture EXAM: CT PELVIS WITHOUT CONTRAST TECHNIQUE: Multidetector CT imaging of the pelvis was performed following the standard protocol without intravenous contrast. RADIATION DOSE REDUCTION: This exam was performed according to the departmental dose-optimization program which includes automated exposure control, adjustment of the mA and/or kV according to patient size and/or use of iterative reconstruction technique. COMPARISON:  X-ray 03/14/2023 and older  FINDINGS: Urinary Tract: Preserved contours of the urinary bladder. Bladder is mildly distended. Presumed cysts involving the kidneys of the edge of the imaging field but incompletely evaluated. Please correlate with prior imaging of the area or if needed ultrasound. Bowel: No oral contrast. Stool in the rectum and sigmoid colon. Significant diverticula of the sigmoid colon. Normal appendix in the right lower quadrant. The visualized small bowel is nondilated. Vascular/Lymphatic: Mild vascular calcifications along the aorta and iliac vessels. No specific abnormal lymph node enlargement identified in the visualized pelvis. Reproductive: Uterus is present. There is a lobular structure in the anterior left adnexa which appears separate from the left ovary. Paraovarian lesion or exophytic pedunculated fibroid is possible. Please correlate for any known history or dedicated workup when appropriate. The structure on series 3, image 24 measures 4.1 by 3.2 cm. Other: No free fluid in the pelvis. There is some hematoma along the extraperitoneal space on the left side in the low pelvis. Musculoskeletal: Acute appearing fracture involving the left inferior pubic ramus which is comminuted. There also fracture lines along the anterior aspect of the left acetabulum. Comminuted fracture fragments extend along the medial portion of the acetabulum, posterior and superior. Fracture lines extend along the left side of the iliac bone inferiorly. Please correlate with the time course of injury. Mild degenerative changes of the visualized portions of the spine and sacroiliac joints. Sclerosis along the pubic symphysis. Focal asymmetric thickening of the left iliacus muscle and obturator internus. Possible associated hematoma. IMPRESSION: Comminuted fracture identified involving the left acetabulum with fracture lines diffusely along all aspects of the acetabulum. Extension of fracture lines along the inferior left iliac bone. Separate  inferior pubic ramus fracture on the left. Surrounding associated hematoma in the extraperitoneal space and involving the musculature of the iliacus muscle and obturator internus. 4.1 cm soft tissue nodule in the left adnexa anterior to the left side of the uterus and ovary. Possibilities would include a paraovarian lesion versus an exophytic uterine fibroid. Please correlate for any known history or dedicated workup when appropriate Electronically Signed   By: Karen Kays M.D.   On: 03/14/2023 16:07   DG Hip Unilat With Pelvis 2-3 Views Left  Result Date: 03/14/2023 CLINICAL DATA:  Trauma, fall EXAM: DG HIP (WITH OR WITHOUT PELVIS) 2-3V LEFT COMPARISON:  07/18/2022 FINDINGS: There is comminuted displaced fracture in left iliac bone along the medial aspect of left acetabulum. There is 8 mm offset in alignment of fracture fragments in the medial aspect of left iliac bone. There is radiolucent line in the lateral cortex of left iliac bone superior to the acetabulum. There is fracture in left inferior pubic ramus with 5 mm offset in alignment. As far as seen, no  fractures are noted in proximal left femur. IMPRESSION: There is no demonstrable fracture in proximal left femur.There is recent comminuted displaced fracture in left iliac bone involving the medial aspect of left acetabulum. Fracture line is involving the lateral cortical margin of left iliac bone superior to the acetabulum. There is recent displaced fracture in the left inferior pubic ramus. Follow-up CT may be considered. Electronically Signed   By: Ernie Avena M.D.   On: 03/14/2023 14:58   _______________________________________________________________________________________________________ Latest  Blood pressure (!) 175/80, pulse (!) 115, temperature 98 F (36.7 C), temperature source Oral, resp. rate 18, SpO2 (!) 85 %.   Vitals  labs and radiology finding personally reviewed  Review of Systems:    Pertinent positives include:  fall  Constitutional:  No weight loss, night sweats, Fevers, chills, fatigue, weight loss  HEENT:  No headaches, Difficulty swallowing,Tooth/dental problems,Sore throat,  No sneezing, itching, ear ache, nasal congestion, post nasal drip,  Cardio-vascular:  No chest pain, Orthopnea, PND, anasarca, dizziness, palpitations.no Bilateral lower extremity swelling  GI:  No heartburn, indigestion, abdominal pain, nausea, vomiting, diarrhea, change in bowel habits, loss of appetite, melena, blood in stool, hematemesis Resp:  no shortness of breath at rest. No dyspnea on exertion, No excess mucus, no productive cough, No non-productive cough, No coughing up of blood.No change in color of mucus.No wheezing. Skin:  no rash or lesions. No jaundice GU:  no dysuria, change in color of urine, no urgency or frequency. No straining to urinate.  No flank pain.  Musculoskeletal:  No joint pain or no joint swelling. No decreased range of motion. No back pain.  Psych:  No change in mood or affect. No depression or anxiety. No memory loss.  Neuro: no localizing neurological complaints, no tingling, no weakness, no double vision, no gait abnormality, no slurred speech, no confusion  All systems reviewed and apart from HOPI all are negative _______________________________________________________________________________________________ Past Medical History:   Past Medical History:  Diagnosis Date   Acute respiratory distress syndrome (ARDS) due to COVID-19 virus (HCC) 09/2019   Chronic combined systolic and diastolic CHF (congestive heart failure) (HCC)    Diabetes mellitus, type 2 (HCC)    GERD (gastroesophageal reflux disease)    Hyperlipidemia    Hypertension       Past Surgical History:  Procedure Laterality Date   COLONOSCOPY N/A 12/21/2016   Procedure: COLONOSCOPY;  Surgeon: Malissa Hippo, MD;  Location: [facility];  Service: Endoscopy;  Laterality: N/A;  930   NO PAST SURGERIES       Social History:  Ambulatory   independently       reports that she has never smoked. She has never used smokeless tobacco. She reports that she does not drink alcohol and does not use drugs.     Family History:   Family History  Problem Relation Age of Onset   Breast cancer Mother    Heart attack Father    Colon cancer Neg Hx    ______________________________________________________________________________________________ Allergies: No Known Allergies   Prior to Admission medications   Medication Sig Start Date End Date Taking? Authorizing Provider  acetaminophen (TYLENOL) 325 MG tablet Take 2 tablets (650 mg total) by mouth every 6 (six) hours as needed for mild pain (or Fever >/= 101). 04/09/21   Emokpae, Courage, MD  albuterol (PROVENTIL) (2.5 MG/3ML) 0.083% nebulizer solution Take 3 mLs (2.5 mg total) by nebulization every 6 (six) hours as needed for wheezing or shortness of breath. 04/09/21   Emokpae,  Courage, MD  amLODipine (NORVASC) 10 MG tablet Take 1 tablet (10 mg total) by mouth daily. 04/09/21   Shon Hale, MD  aspirin EC 81 MG tablet Take 1 tablet (81 mg total) by mouth daily with breakfast. 04/09/21   Shon Hale, MD  carvedilol (COREG) 6.25 MG tablet TAKE 1 TABLET TWICE A DAY 12/25/22   Antoine Poche, MD  furosemide (LASIX) 40 MG tablet Take 1 tablet (40 mg total) by mouth daily. 04/10/21   Shon Hale, MD  HYDROcodone-acetaminophen (NORCO/VICODIN) 5-325 MG tablet Take 1 tablet by mouth every 6 (six) hours as needed for severe pain.    [provider]  losartan (COZAAR) 100 MG tablet Take 1 tablet (100 mg total) by mouth daily. 04/10/21   Shon Hale, MD  methocarbamol (ROBAXIN) 750 MG tablet Take 750 mg by mouth every 8 (eight) hours as needed for muscle spasms. 07/16/22   [provider]  Multiple Vitamin (MULTIVITAMIN) tablet Take 1 tablet by mouth daily.    [provider]  omeprazole (PRILOSEC) 40 MG capsule Take 1  capsule by mouth daily. 07/24/19   [provider]  rosuvastatin (CRESTOR) 10 MG tablet Take 10 mg by mouth daily.    [provider]    ___________________________________________________________________________________________________ Physical Exam:    03/14/2023    6:19 PM 03/14/2023    1:43 PM 02/12/2023   11:55 AM  Vitals with BMI  Systolic 175 172 161  Diastolic 80 83 88  Pulse 115 82 87     1. General:  in No  Acute distress     Chronically ill-appearing 2. Psychological: Alert and   Oriented 3. Head/ENT:   Dry Mucous Membranes                          Head Non traumatic, neck supple                        Poor Dentition 4. SKIN:  decreased Skin turgor,  Skin clean Dry and intact no rash    5. Heart: Regular rate and rhythm no  Murmur, no Rub or gallop 6. Lungs: , no wheezes or crackles   7. Abdomen: Soft,  non-tender, Non distended   obese  bowel sounds present 8. Lower extremities: no clubbing, cyanosis, no  edema 9. Neurologically Grossly intact, moving all 4 extremities equally   10. MSK: Normal range of motion limited in left hip    Chart has been reviewed  ______________________________________________________________________________________________  Assessment/Plan 72 y.o. female with medical history significant of CHF DM2 hx of ARDS after COVID       Admitted for   Closed nondisplaced fracture of acetabulum, unspecified portion of acetabulum, unspecified laterality, initial encounter (HCC)    Present on Admission:  Acetabular fracture (HCC)  Chronic heart failure with preserved ejection fraction (HFpEF) (HCC)  Sleep apnea  Chronic respiratory failure with hypoxia (HCC)     Chronic heart failure with preserved ejection fraction (HFpEF) (HCC) Well compensated euvolemic currently HOld Lasix for tonight Allow permissive HTN Restart Coreg 6.25mg  po BID  Acetabular fracture (HCC)  - management as per orthopedics,  plan to operate   in   a.m.  Keep nothing by mouth post midnight. Patient   not on anticoagulation  on  aspirin  on hold Ordered type and screen,  order a vitamin D level  Patient at baseline able to walk a flight of stairs or 100  feet   Patient denies any chest pain or shortness of breath currently and/or with exertion, ECG showing no evidence of acute ischemia   known history of coronary artery disease, hx of ARDS due to COVID Hx of Diastolic CHF  Given advanced age patient is at least moderate  risk which has been discussed with patient    will order echo CE x1 O2 as needed CXR non acute      Uncontrolled diabetes mellitus (HCC)  - Order Sensitive SSI    -  check TSH and HgA1C  - Hold by mouth medications    Sleep apnea Use CPAP at night  Chronic respiratory failure with hypoxia (HCC) 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  DVT prophylaxis:  SCD      Code Status:    Code Status: Prior FULL CODE as per patient  I had personally discussed CODE STATUS with patient     ACPnone   Family Communication:   Family not at  Bedside discussed with daughter Kristina [name] on the phone    Diet  Diet Orders (From admission, onward)     Start     Ordered   03/15/23 0001  Diet NPO time specified  Diet effective midnight        03/14/23 1836            Disposition Plan:     likely will need placement for rehabilitation                         Following barriers for discharge:                                                         Pain controlled with PO medications                                                           Will need consultants to evaluate patient prior to discharge                      Transition of care consulted                  Consults called:  orthopedics are  aware   Admission status:  ED Disposition     ED Disposition  Admit   Condition  --   Comment  Hospital Area: [facility] [[id number]]  Level of Care: Telemetry Medical [104]  May admit patient to [facility] or [facility] if equivalent level of care is available:: No  Covid Evaluation: Asymptomatic - no recent exposure (last 10 days) testing not required  Diagnosis: Acetabular fracture (HCC) [343000]  Admitting Physician: Therisa Doyne [[id number]]  Attending Physician: Therisa Doyne [[id number]]  Certification:: I certify this patient will need inpatient services for at least 2 midnights  Estimated Length of Stay: 2  inpatient     I Expect 2 midnight stay secondary to severity of patient's current illness need for inpatient interventions justified by the following:  Severe lab/radiological/exam abnormalities including:   Left acetabular fracture and extensive comorbidities including:  DM2    CHF  CAD Post COVID lung disease   That are currently affecting medical management.   I expect  patient to be hospitalized for 2 midnights requiring inpatient medical care.  Patient is at high risk for adverse outcome (such as loss of life or disability) if not treated.  Indication for inpatient stay as follows:    severe pain requiring acute inpatient management,  inability to maintain oral hydration     Need for operative/procedural  intervention    Need for   IV fluids  IV pain medications,    Level of care     tele  For  24H        Billee Balcerzak 03/14/2023, 9:12 PM    [facility]     after 2 AM please page floor coverage PA If 7AM-7PM, please contact the day team taking care of the patient using [email or web address]

## 2023-03-14 NOTE — Assessment & Plan Note (Signed)
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

## 2023-03-14 NOTE — ED Provider Notes (Signed)
Received patient in turnover from Dr. Suezanne Jacquet.  Please see their note for further details of Hx, PE.  Briefly patient is a 72 y.o. female with a Fall .  Patient had lost her balance going over a curb on the streets and landed on her bottom.  Unable to walk afterwards.  Found to have a inferior pubic ramus fracture on x-ray.  With her still unable to bear weight plan to order CT scan.  CT is resulted with a left acetabular fracture.  Discussed with Dr. Susa Simmonds, recommended medical admission and she would discuss the case with the Ortho trauma specialist tomorrow.    Melene Plan, DO 03/14/23 [phone]

## 2023-03-14 NOTE — Progress Notes (Signed)
   03/14/23 2325  BiPAP/CPAP/SIPAP  Reason BIPAP/CPAP not in use Non-compliant

## 2023-03-14 NOTE — Assessment & Plan Note (Addendum)
Well compensated euvolemic currently HOld Lasix for tonight Allow permissive HTN Restart Coreg 6.25mg  po BID

## 2023-03-14 NOTE — ED Notes (Signed)
Daughter would like an update, Admitting MD notified. [employer] [phone]

## 2023-03-14 NOTE — Assessment & Plan Note (Signed)
Use CPAP at night. °

## 2023-03-14 NOTE — Assessment & Plan Note (Addendum)
-   management as per orthopedics,  plan to operate   in  a.m.  Keep nothing by mouth post midnight. 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

## 2023-03-14 NOTE — ED Provider Notes (Signed)
[facility] EMERGENCY DEPARTMENT AT [facility] Provider Note  CSN: [id number] Arrival date & time: 03/14/23 1323  Chief Complaint(s) Fall  HPI Kristina [name] is a 72 y.o. female history of CHF, diabetes, hypertension, hyperlipidemia presenting to the emergency department with fall.  Patient reports that she was walking around a curb and tripped and lost balance landing on her left hip.  She reports that she has not been able to ambulate since the fall.  Denies pain elsewhere.  Did not hit her head.  She scraped her left elbow as well but reports it is not painful.  No head injury.  No pain in the neck or back.  No abdominal or chest pain.  Brought in by EMS.   Past Medical History Past Medical History:  Diagnosis Date   Acute respiratory distress syndrome (ARDS) due to COVID-19 virus (HCC) 09/2019   Chronic combined systolic and diastolic CHF (congestive heart failure) (HCC)    Diabetes mellitus, type 2 (HCC)    GERD (gastroesophageal reflux disease)    Hyperlipidemia    Hypertension    Patient Active Problem List   Diagnosis Date Noted   OP (osteoporosis) 01/22/2023   Sleep apnea 09/08/2021   Acute on chronic heart failure with preserved ejection fraction (HFpEF) /Acute on Chronic Diastolic CHF 04/09/2021   Hypoxia    Essential hypertension 10/08/2019   Uncontrolled diabetes mellitus 10/08/2019   UTI (urinary tract infection) 10/08/2019   Thrush 10/08/2019   Pneumonia due to COVID-19 virus 10/08/2019   Chronic respiratory failure with hypoxia (HCC) 10/02/2019   Acute respiratory distress syndrome (ARDS) due to COVID-19 virus (HCC) 10/02/2019   Special screening for malignant neoplasms, colon 11/06/2016   Home Medication(s) Prior to Admission medications   Medication Sig Start Date End Date Taking? Authorizing Provider  acetaminophen (TYLENOL) 325 MG tablet Take 2 tablets (650 mg total) by mouth every 6 (six) hours as needed for mild pain (or Fever >/= 101). 04/09/21    Emokpae, Courage, MD  albuterol (PROVENTIL) (2.5 MG/3ML) 0.083% nebulizer solution Take 3 mLs (2.5 mg total) by nebulization every 6 (six) hours as needed for wheezing or shortness of breath. 04/09/21   Shon Hale, MD  amLODipine (NORVASC) 10 MG tablet Take 1 tablet (10 mg total) by mouth daily. 04/09/21   Shon Hale, MD  aspirin EC 81 MG tablet Take 1 tablet (81 mg total) by mouth daily with breakfast. 04/09/21   Shon Hale, MD  carvedilol (COREG) 6.25 MG tablet TAKE 1 TABLET TWICE A DAY 12/25/22   Antoine Poche, MD  furosemide (LASIX) 40 MG tablet Take 1 tablet (40 mg total) by mouth daily. 04/10/21   Shon Hale, MD  HYDROcodone-acetaminophen (NORCO/VICODIN) 5-325 MG tablet Take 1 tablet by mouth every 6 (six) hours as needed for severe pain.    [provider]  losartan (COZAAR) 100 MG tablet Take 1 tablet (100 mg total) by mouth daily. 04/10/21   Shon Hale, MD  methocarbamol (ROBAXIN) 750 MG tablet Take 750 mg by mouth every 8 (eight) hours as needed for muscle spasms. 07/16/22   [provider]  Multiple Vitamin (MULTIVITAMIN) tablet Take 1 tablet by mouth daily.    [provider]  omeprazole (PRILOSEC) 40 MG capsule Take 1 capsule by mouth daily. 07/24/19   [provider]  rosuvastatin (CRESTOR) 10 MG tablet Take 10 mg by mouth daily.    [provider]  Past Surgical History Past Surgical History:  Procedure Laterality Date   COLONOSCOPY N/A 12/21/2016   Procedure: COLONOSCOPY;  Surgeon: Malissa Hippo, MD;  Location: [facility];  Service: Endoscopy;  Laterality: N/A;  930   NO PAST SURGERIES     Family History Family History  Problem Relation Age of Onset   Breast cancer Mother    Heart attack Father    Colon cancer Neg Hx     Social History Social History   Tobacco Use    Smoking status: Never   Smokeless tobacco: Never  Vaping Use   Vaping Use: Never used  Substance Use Topics   Alcohol use: No   Drug use: No   Allergies Patient has no known allergies.  Review of Systems Review of Systems  All other systems reviewed and are negative.   Physical Exam Vital Signs  I have reviewed the triage vital signs BP (!) 172/83 (BP Location: Right Arm)   Pulse 82   Temp 98.2 F (36.8 C) (Oral)   Resp 17   SpO2 100%  Physical Exam Vitals and nursing note reviewed.  Constitutional:      General: She is not in acute distress.    Appearance: She is well-developed.  HENT:     Head: Normocephalic and atraumatic.     Mouth/Throat:     Mouth: Mucous membranes are moist.  Eyes:     Pupils: Pupils are equal, round, and reactive to light.  Cardiovascular:     Rate and Rhythm: Normal rate and regular rhythm.     Heart sounds: No murmur heard. Pulmonary:     Effort: Pulmonary effort is normal. No respiratory distress.     Breath sounds: Normal breath sounds.  Abdominal:     General: Abdomen is flat.     Palpations: Abdomen is soft.     Tenderness: There is no abdominal tenderness.  Musculoskeletal:        General: No tenderness.     Right lower leg: No edema.     Left lower leg: No edema.     Comments: Painful and very limited range of motion of the left hip.  No tenderness to the left knee, range of motion of the left ankle normal with the tenderness.  Distal pulses intact.  Full active range of motion of the right hip knee and ankle.  Full active range of motion of the bilateral upper extremities at the shoulder, elbow, wrist.  No focal tenderness throughout the right lower and bilateral upper extremities.  No midline C, T, L-spine tenderness.  Skin:    General: Skin is warm and dry.     Comments: Small abrasion over the left elbow  Neurological:     General: No focal deficit present.     Mental Status: She is alert. Mental status is at baseline.   Psychiatric:        Mood and Affect: Mood normal.        Behavior: Behavior normal.     ED Results and Treatments Labs (all labs ordered are listed, but only abnormal results are displayed) Labs Reviewed  BASIC METABOLIC PANEL - Abnormal; Notable for the following components:      Result Value   Chloride 95 (*)    Glucose, Bld 165 (*)    Calcium 8.8 (*)    All other components within normal limits  CBC WITH DIFFERENTIAL/PLATELET - Abnormal; Notable for the following components:   RBC 5.33 (*)  Hemoglobin 15.2 (*)    HCT 49.2 (*)    All other components within normal limits                                                                                                                          Radiology DG Hip Unilat With Pelvis 2-3 Views Left  Result Date: 03/14/2023 CLINICAL DATA:  Trauma, fall EXAM: DG HIP (WITH OR WITHOUT PELVIS) 2-3V LEFT COMPARISON:  07/18/2022 FINDINGS: There is comminuted displaced fracture in left iliac bone along the medial aspect of left acetabulum. There is 8 mm offset in alignment of fracture fragments in the medial aspect of left iliac bone. There is radiolucent line in the lateral cortex of left iliac bone superior to the acetabulum. There is fracture in left inferior pubic ramus with 5 mm offset in alignment. As far as seen, no fractures are noted in proximal left femur. IMPRESSION: There is no demonstrable fracture in proximal left femur.There is recent comminuted displaced fracture in left iliac bone involving the medial aspect of left acetabulum. Fracture line is involving the lateral cortical margin of left iliac bone superior to the acetabulum. There is recent displaced fracture in the left inferior pubic ramus. Follow-up CT may be considered. Electronically Signed   By: Ernie Avena M.D.   On: 03/14/2023 14:58    Pertinent labs & imaging results that were available during my care of the patient were reviewed by me and considered in my  medical decision making (see MDM for details).  Medications Ordered in ED Medications  HYDROmorphone (DILAUDID) injection 0.5 mg (0.5 mg Intravenous Given 03/14/23 1450)                                                                                                                                     Procedures Procedures  (including critical care time)  Medical Decision Making / ED Course   MDM:  72 year old female presenting to the emergency department after ground-level fall on to the hip.  Examination with significant pain and tenderness to the left hip with distal pulses intact.  Also has small abrasion of the left elbow without evidence of underlying tenderness or limited range of motion.  Will give pain control, obtain x-ray.  Will check basic labs as given degree of pain suspect possible fracture.  No evidence of head or neck trauma, no spinal trauma  Clinical Course as of 03/14/23 1555  Wed Mar 14, 2023  1530 X-ray with some pelvic fractures around the left hip without sign of hip fracture, will obtain CT to further characterize and rule out occult hip fracture. Signed out to Dr. Adela Lank pending this.  [WS]    Clinical Course User Index [WS] Lonell Grandchild, MD     Additional history obtained: -Additional history obtained from ems -External records from outside source obtained and reviewed including: Chart review including previous notes, labs, imaging, consultation notes including prior PMD notes   Lab Tests: -I ordered, reviewed, and interpreted labs.   The pertinent results include:   Labs Reviewed  BASIC METABOLIC PANEL - Abnormal; Notable for the following components:      Result Value   Chloride 95 (*)    Glucose, Bld 165 (*)    Calcium 8.8 (*)    All other components within normal limits  CBC WITH DIFFERENTIAL/PLATELET - Abnormal; Notable for the following components:   RBC 5.33 (*)    Hemoglobin 15.2 (*)    HCT 49.2 (*)    All other components  within normal limits    Notable for no AKI, no leukocytosis  Imaging Studies ordered: I ordered imaging studies including XR hip On my interpretation imaging demonstrates possible pelvic fracture I independently visualized and interpreted imaging. I agree with the radiologist interpretation   Medicines ordered and prescription drug management: Meds ordered this encounter  Medications   HYDROmorphone (DILAUDID) injection 0.5 mg    -I have reviewed the patients home medicines and have made adjustments as needed    Cardiac Monitoring: The patient was maintained on a cardiac monitor.  I personally viewed and interpreted the cardiac monitored which showed an underlying rhythm of: NSR    Reevaluation: After the interventions noted above, I reevaluated the patient and found that their symptoms have improved  Co morbidities that complicate the patient evaluation  Past Medical History:  Diagnosis Date   Acute respiratory distress syndrome (ARDS) due to COVID-19 virus (HCC) 09/2019   Chronic combined systolic and diastolic CHF (congestive heart failure) (HCC)    Diabetes mellitus, type 2 (HCC)    GERD (gastroesophageal reflux disease)    Hyperlipidemia    Hypertension       Dispostion: Disposition decision including need for hospitalization was considered, and patient disposition pending at time of sign out.    Final Clinical Impression(s) / ED Diagnoses Final diagnoses:  Closed nondisplaced fracture of acetabulum, unspecified portion of acetabulum, unspecified laterality, initial encounter [facility])     This chart was dictated using voice recognition software.  Despite best efforts to proofread,  errors can occur which can change the documentation meaning.    Lonell Grandchild, MD 03/14/23 [phone]

## 2023-03-14 NOTE — Assessment & Plan Note (Signed)
-   Order Sensitive  SSI     -  check TSH and HgA1C  - Hold by mouth medications*  

## 2023-03-15 ENCOUNTER — Inpatient Hospital Stay (HOSPITAL_COMMUNITY): Payer: Medicare HMO

## 2023-03-15 DIAGNOSIS — Z01818 Encounter for other preprocedural examination: Secondary | ICD-10-CM

## 2023-03-15 DIAGNOSIS — S32402A Unspecified fracture of left acetabulum, initial encounter for closed fracture: Secondary | ICD-10-CM | POA: Diagnosis not present

## 2023-03-15 DIAGNOSIS — I10 Essential (primary) hypertension: Secondary | ICD-10-CM | POA: Diagnosis not present

## 2023-03-15 LAB — ECHOCARDIOGRAM COMPLETE
AR max vel: 2.26 cm2
AV Peak grad: 6.7 mmHg
Ao pk vel: 1.29 m/s
Area-P 1/2: 3.72 cm2
Height: 58 in
MV M vel: 3.27 m/s
MV Peak grad: 42.8 mmHg
S' Lateral: 3.2 cm
Weight: 2701.96 oz

## 2023-03-15 LAB — GLUCOSE, CAPILLARY
Glucose-Capillary: 206 mg/dL — ABNORMAL HIGH (ref 70–99)
Glucose-Capillary: 133 mg/dL — ABNORMAL HIGH (ref 70–99)
Glucose-Capillary: 138 mg/dL — ABNORMAL HIGH (ref 70–99)
Glucose-Capillary: 114 mg/dL — ABNORMAL HIGH (ref 70–99)
Glucose-Capillary: 137 mg/dL — ABNORMAL HIGH (ref 70–99)
Glucose-Capillary: 152 mg/dL — ABNORMAL HIGH (ref 70–99)

## 2023-03-15 LAB — SURGICAL PCR SCREEN
MRSA, PCR: NEGATIVE
Staphylococcus aureus: NEGATIVE

## 2023-03-15 LAB — PREALBUMIN: Prealbumin: 21 mg/dL (ref 18–38)

## 2023-03-15 MED ORDER — HYDRALAZINE HCL 20 MG/ML IJ SOLN: 10.0000 mg | INTRAMUSCULAR | Status: DC | PRN

## 2023-03-15 MED ORDER — ADULT MULTIVITAMIN W/MINERALS CH
1.0000 | ORAL_TABLET | Freq: Every day | ORAL | Status: DC
  Administered 2023-03-15 – 2023-03-20 (×5): 1 via ORAL
  Filled 2023-03-15 (×5): qty 1

## 2023-03-15 MED ORDER — LOSARTAN POTASSIUM 50 MG PO TABS
100.0000 mg | ORAL_TABLET | Freq: Every day | ORAL | Status: DC
  Administered 2023-03-15 – 2023-03-20 (×5): 100 mg via ORAL
  Filled 2023-03-15 (×5): qty 2

## 2023-03-15 MED ORDER — ENSURE ENLIVE PO LIQD
237.0000 mL | ORAL | Status: DC
  Administered 2023-03-15 – 2023-03-19 (×5): 237 mL via ORAL

## 2023-03-15 MED ORDER — AMLODIPINE BESYLATE 10 MG PO TABS
10.0000 mg | ORAL_TABLET | Freq: Every day | ORAL | Status: DC
  Administered 2023-03-15 – 2023-03-20 (×4): 10 mg via ORAL
  Filled 2023-03-15 (×5): qty 1

## 2023-03-15 NOTE — Progress Notes (Signed)
Initial Nutrition Assessment  DOCUMENTATION CODES:   Obesity unspecified  INTERVENTION:  Ensure Enlive po once daily, each supplement provides 350 kcal and 20 grams of protein. MVI with minerals daily  NUTRITION DIAGNOSIS:   Increased nutrient needs related to hip fracture as evidenced by estimated needs.  GOAL:   Patient will meet greater than or equal to 90% of their needs  MONITOR:   PO intake, Supplement acceptance, Labs  REASON FOR ASSESSMENT:   Consult Hip fracture protocol  ASSESSMENT:   Pt admitted from home after a fall leading to L acetabular fracture. PMH significant for CHF, T2DM and ARDS after COVID.  Plans for ORIF tomorrow.   Pt reports having a good appetite. She does not follow any specific diet at home. She eats whatever/whenever she wants. Does not typically check her blood sugar at home. Reports that she is borderline diabetic.   She takes a MVI at home. Does not supplement with protein shakes but is open to receive them during admission to support post-op healing.   She reports having stable weight with no significant changes. Reviewed limited documentation of weight history within the last year. Pt's weight appears to have declined 8.2% in the last 7 months. Pt with a history of CHF, uncertain d/t limited history whether this is dry weight loss versus fluctuations in volume status.   Medications: SSI 0-9 units q4h, protonix  Labs: Vitamin D 29.31, hgbA1c 5.7%, CBG's 133-241 x24 hours  NUTRITION - FOCUSED PHYSICAL EXAM:  Flowsheet Row Most Recent Value  Orbital Region No depletion  Upper Arm Region No depletion  Thoracic and Lumbar Region No depletion  Buccal Region No depletion  Temple Region No depletion  Clavicle Bone Region No depletion  Clavicle and Acromion Bone Region No depletion  Scapular Bone Region No depletion  Dorsal Hand No depletion  Patellar Region Moderate depletion  Anterior Thigh Region Moderate depletion  Posterior Calf  Region No depletion  Edema (RD Assessment) None  Hair Reviewed  Eyes Reviewed  Mouth Reviewed  Skin Reviewed  Nails Reviewed       Diet Order:   Diet Order             Diet NPO time specified Except for: Sips with Meds  Diet effective midnight           Diet Carb Modified Fluid consistency: Thin; Room service appropriate? Yes  Diet effective now                   EDUCATION NEEDS:   Education needs have been addressed  Skin:  Skin Assessment: Reviewed RN Assessment  Last BM:  6/19  Height:   Ht Readings from Last 1 Encounters:  03/14/23 4\' 10"  (1.473 m)    Weight:   Wt Readings from Last 1 Encounters:  03/14/23 76.6 kg    Ideal Body Weight:  43.9 kg  BMI:  Body mass index is 35.29 kg/m.  Estimated Nutritional Needs:   Kcal:  1250-1450  Protein:  60-75g  Fluid:  1.3-1.5L   Drusilla Kanner, RDN, LDN Clinical Nutrition

## 2023-03-15 NOTE — TOC CAGE-AID Note (Signed)
Transition of Care [facility]) - CAGE-AID Screening   Patient Details  Name: Kristina [name] MRN: [id number] Date of Birth: [birth year]  Clinical Narrative:  Patient denies any alcohol or drug use, no need for substance abuse resources at this time.  CAGE-AID Screening:    Have You Ever Felt You Ought to Cut Down on Your Drinking or Drug Use?: No Have People Annoyed You By Critizing Your Drinking Or Drug Use?: No Have You Felt Bad Or Guilty About Your Drinking Or Drug Use?: No Have You Ever Had a Drink or Used Drugs First Thing In The Morning to Steady Your Nerves or to Get Rid of a Hangover?: No CAGE-AID Score: 0  Substance Abuse Education Offered: No

## 2023-03-15 NOTE — H&P (View-Only) (Signed)
Reason for Consult:Left acetabulum fx Referring Physician: Pratik Shah Time called: 0753 Time at bedside: 1005   Kristina [name] is an 72 y.o. female.  HPI: Kristina [name] was stepping off a curb, lost her balance, and fell. She had immediate left hip pain and could not get up. She was brought to the ED where workup showed a left acetabulum fx and orthopedic surgery was consulted. She lives at home with her husband and does not use any assistive devices to ambulate.  Past Medical History:  Diagnosis Date   Acute respiratory distress syndrome (ARDS) due to COVID-19 virus (HCC) 09/2019   Chronic combined systolic and diastolic CHF (congestive heart failure) (HCC)    Diabetes mellitus, type 2 (HCC)    GERD (gastroesophageal reflux disease)    Hyperlipidemia    Hypertension     Past Surgical History:  Procedure Laterality Date   COLONOSCOPY N/A 12/21/2016   Procedure: COLONOSCOPY;  Surgeon: Najeeb U Rehman, MD;  Location: [facility];  Service: Endoscopy;  Laterality: N/A;  930   NO PAST SURGERIES      Family History  Problem Relation Age of Onset   Breast cancer Mother    Heart attack Father    Colon cancer Neg Hx     Social History:  reports that she has never smoked. She has never used smokeless tobacco. She reports that she does not drink alcohol and does not use drugs.  Allergies: No Known Allergies  Medications: I have reviewed the patient's current medications.  Results for orders placed or performed during the hospital encounter of 03/14/23 (from the past 48 hour(s))  Basic metabolic panel     Status: Abnormal   Collection Time: 03/14/23  1:40 PM  Result Value Ref Range   Sodium 137 135 - 145 mmol/L   Potassium 3.5 3.5 - 5.1 mmol/L   Chloride 95 (L) 98 - 111 mmol/L   CO2 32 22 - 32 mmol/L   Glucose, Bld 165 (H) 70 - 99 mg/dL    Comment: Glucose reference range applies only to samples taken after fasting for at least 8 hours.   BUN 13 8 - 23 mg/dL   Creatinine, Ser  0.66 0.44 - 1.00 mg/dL   Calcium 8.8 (L) 8.9 - 10.3 mg/dL   GFR, Estimated >60 >60 mL/min    Comment: (NOTE) Calculated using the CKD-EPI Creatinine Equation (2021)    Anion gap 10 5 - 15    Comment: Performed at [facility], [address] [place], [place] [address]  CBC with Differential     Status: Abnormal   Collection Time: 03/14/23  1:40 PM  Result Value Ref Range   WBC 8.9 4.0 - 10.5 K/uL   RBC 5.33 (H) 3.87 - 5.11 MIL/uL   Hemoglobin 15.2 (H) 12.0 - 15.0 g/dL   HCT 49.2 (H) 36.0 - 46.0 %   MCV 92.3 80.0 - 100.0 fL   MCH 28.5 26.0 - 34.0 pg   MCHC 30.9 30.0 - 36.0 g/dL   RDW 14.1 11.5 - 15.5 %   Platelets 221 150 - 400 K/uL   nRBC 0.0 0.0 - 0.2 %   Neutrophils Relative % 73 %   Neutro Abs 6.6 1.7 - 7.7 K/uL   Lymphocytes Relative 17 %   Lymphs Abs 1.5 0.7 - 4.0 K/uL   Monocytes Relative 7 %   Monocytes Absolute 0.6 0.1 - 1.0 K/uL   Eosinophils Relative 1 %   Eosinophils Absolute 0.1 0.0 -   0.5 K/uL   Basophils Relative 1 %   Basophils Absolute 0.1 0.0 - 0.1 K/uL   Immature Granulocytes 1 %   Abs Immature Granulocytes 0.06 0.00 - 0.07 K/uL    Comment: Performed at [facility], [address] [place], [place] [address]  CBG monitoring, ED     Status: Abnormal   Collection Time: 03/14/23  8:00 PM  Result Value Ref Range   Glucose-Capillary 241 (H) 70 - 99 mg/dL    Comment: Glucose reference range applies only to samples taken after fasting for at least 8 hours.  Hemoglobin A1c     Status: Abnormal   Collection Time: 03/14/23  8:54 PM  Result Value Ref Range   Hgb A1c MFr Bld 5.7 (H) 4.8 - 5.6 %    Comment: (NOTE) Pre diabetes:          5.7%-6.4%  Diabetes:              >6.4%  Glycemic control for   <7.0% adults with diabetes    Mean Plasma Glucose 116.89 mg/dL    Comment: Performed at [facility], [address] [place], [place] [address]  Hepatic function panel     Status: None   Collection Time: 03/14/23  8:54 PM  Result Value Ref  Range   Total Protein 6.6 6.5 - 8.1 g/dL   Albumin 3.6 3.5 - 5.0 g/dL   AST 30 15 - 41 U/L   ALT 17 0 - 44 U/L   Alkaline Phosphatase 69 38 - 126 U/L   Total Bilirubin 0.7 0.3 - 1.2 mg/dL   Bilirubin, Direct 0.2 0.0 - 0.2 mg/dL   Indirect Bilirubin 0.5 0.3 - 0.9 mg/dL    Comment: Performed at [facility], [address] [place], [place] [address]  Magnesium     Status: None   Collection Time: 03/14/23  8:54 PM  Result Value Ref Range   Magnesium 1.9 1.7 - 2.4 mg/dL    Comment: Performed at [facility], [address] [place], [place] [address]  Phosphorus     Status: None   Collection Time: 03/14/23  8:54 PM  Result Value Ref Range   Phosphorus 3.2 2.5 - 4.6 mg/dL    Comment: Performed at [facility], [address] [place], [place] [address]  CK     Status: None   Collection Time: 03/14/23  8:54 PM  Result Value Ref Range   Total CK 95 38 - 234 U/L    Comment: Performed at [facility], [address] [place], [place] [address]  Protime-INR     Status: None   Collection Time: 03/14/23  8:54 PM  Result Value Ref Range   Prothrombin Time 12.8 11.4 - 15.2 seconds   INR 0.9 0.8 - 1.2    Comment: (NOTE) INR goal varies based on device and disease states. Performed at [facility], [address] [place], [place] [address]   TSH     Status: None   Collection Time: 03/14/23  8:54 PM  Result Value Ref Range   TSH 0.560 0.350 - 4.500 uIU/mL    Comment: Performed by a 3rd Generation assay with a functional sensitivity of <=0.01 uIU/mL. Performed at  Hospital Lab, [address] [place], [place] [address]   VITAMIN D 25 Hydroxy (Vit-D Deficiency, Fractures)     Status: Abnormal   Collection Time: 03/14/23  8:54 PM    Result Value Ref Range   Vit D, 25-Hydroxy 29.31 (L) 30 - 100 ng/mL    Comment: (NOTE) Vitamin D deficiency has been defined by the Institute of Medicine  and an Endocrine Society practice guideline as a level of serum 25-OH   vitamin D less than 20 ng/mL (1,2). The Endocrine Society went on to  further define vitamin D insufficiency as a level between 21 and 29  ng/mL (2).  1. IOM (Institute of Medicine). 2010. Dietary reference intakes for  calcium and D. [place]: The National Academies Press. 2. Holick MF, Binkley [place], Bischoff-Ferrari HA, et al. Evaluation,  treatment, and prevention of vitamin D deficiency: an Endocrine  Society clinical practice guideline, JCEM. 2011 Jul; 96(7): 1911-30.  Performed at [facility], [address] [place], [place] [address]   Glucose, capillary     Status: Abnormal   Collection Time: 03/14/23  9:41 PM  Result Value Ref Range   Glucose-Capillary 206 (H) 70 - 99 mg/dL    Comment: Glucose reference range applies only to samples taken after fasting for at least 8 hours.  Troponin I (High Sensitivity)     Status: None   Collection Time: 03/14/23 10:12 PM  Result Value Ref Range   Troponin I (High Sensitivity) 8 <18 ng/L    Comment: (NOTE) Elevated high sensitivity troponin I (hsTnI) values and significant  changes across serial measurements may suggest ACS but many other  chronic and acute conditions are known to elevate hsTnI results.  Refer to the "Links" section for chest pain algorithms and additional  guidance. Performed at [facility], [address] [place], [place] [address]   Type and screen     Status: None   Collection Time: 03/14/23 10:17 PM  Result Value Ref Range   ABO/RH(D) O POS    Antibody Screen NEG    Sample Expiration      03/17/2023,2359 Performed at [facility], [address] [place], [place] [address]   Glucose, capillary     Status: Abnormal   Collection Time: 03/14/23 11:55 PM  Result Value Ref Range   Glucose-Capillary 170 (H) 70 - 99 mg/dL    Comment: Glucose reference range applies only to samples taken after fasting for at least 8 hours.   Comment 1 Notify RN    Comment 2 Document in Chart    Prealbumin     Status: None   Collection Time: 03/15/23  1:12 AM  Result Value Ref Range   Prealbumin 21 18 - 38 mg/dL    Comment: Performed at [facility], [address] , [place] [address]  Glucose, capillary     Status: Abnormal   Collection Time: 03/15/23  3:59 AM  Result Value Ref Range   Glucose-Capillary 133 (H) 70 - 99 mg/dL    Comment: Glucose reference range applies only to samples taken after fasting for at least 8 hours.  Glucose, capillary     Status: Abnormal   Collection Time: 03/15/23  7:35 AM  Result Value Ref Range   Glucose-Capillary 138 (H) 70 - 99 mg/dL    Comment: Glucose reference range applies only to samples taken after fasting for at least 8 hours.    DG Chest Port 1 View  Result Date: 03/14/2023 CLINICAL DATA:  Preop evaluation EXAM: PORTABLE CHEST 1 VIEW COMPARISON:  04/09/2021 FINDINGS: Transverse diameter of heart is increased. Low lung volumes are noted. Increased interstitial markings are seen in left   lower lung field with no interval change. Left hemidiaphragm is elevated. There are no new infiltrates or signs of pulmonary edema. There is no pleural effusion or pneumothorax. IMPRESSION: Linear densities in left lower lung field may suggest scarring or subsegmental atelectasis. Low lung volumes. Left hemidiaphragm is elevated. There are no new infiltrates or signs of pulmonary edema. Electronically Signed   By: Palani  Rathinasamy M.D.   On: 03/14/2023 19:28   CT PELVIS WO CONTRAST  Result Date: 03/14/2023 CLINICAL DATA:  Hip fracture EXAM: CT PELVIS WITHOUT CONTRAST TECHNIQUE: Multidetector CT imaging of the pelvis was performed following the standard protocol without intravenous contrast. RADIATION DOSE REDUCTION: This exam was performed according to the departmental dose-optimization program which includes automated exposure control, adjustment of the mA and/or kV according to patient size and/or use of iterative reconstruction technique.  COMPARISON:  X-ray 03/14/2023 and older FINDINGS: Urinary Tract: Preserved contours of the urinary bladder. Bladder is mildly distended. Presumed cysts involving the kidneys of the edge of the imaging field but incompletely evaluated. Please correlate with prior imaging of the area or if needed ultrasound. Bowel: No oral contrast. Stool in the rectum and sigmoid colon. Significant diverticula of the sigmoid colon. Normal appendix in the right lower quadrant. The visualized small bowel is nondilated. Vascular/Lymphatic: Mild vascular calcifications along the aorta and iliac vessels. No specific abnormal lymph node enlargement identified in the visualized pelvis. Reproductive: Uterus is present. There is a lobular structure in the anterior left adnexa which appears separate from the left ovary. Paraovarian lesion or exophytic pedunculated fibroid is possible. Please correlate for any known history or dedicated workup when appropriate. The structure on series 3, image 24 measures 4.1 by 3.2 cm. Other: No free fluid in the pelvis. There is some hematoma along the extraperitoneal space on the left side in the low pelvis. Musculoskeletal: Acute appearing fracture involving the left inferior pubic ramus which is comminuted. There also fracture lines along the anterior aspect of the left acetabulum. Comminuted fracture fragments extend along the medial portion of the acetabulum, posterior and superior. Fracture lines extend along the left side of the iliac bone inferiorly. Please correlate with the time course of injury. Mild degenerative changes of the visualized portions of the spine and sacroiliac joints. Sclerosis along the pubic symphysis. Focal asymmetric thickening of the left iliacus muscle and obturator internus. Possible associated hematoma. IMPRESSION: Comminuted fracture identified involving the left acetabulum with fracture lines diffusely along all aspects of the acetabulum. Extension of fracture lines along  the inferior left iliac bone. Separate inferior pubic ramus fracture on the left. Surrounding associated hematoma in the extraperitoneal space and involving the musculature of the iliacus muscle and obturator internus. 4.1 cm soft tissue nodule in the left adnexa anterior to the left side of the uterus and ovary. Possibilities would include a paraovarian lesion versus an exophytic uterine fibroid. Please correlate for any known history or dedicated workup when appropriate Electronically Signed   By: Ashok  Gupta M.D.   On: 03/14/2023 16:07   DG Hip Unilat With Pelvis 2-3 Views Left  Result Date: 03/14/2023 CLINICAL DATA:  Trauma, fall EXAM: DG HIP (WITH OR WITHOUT PELVIS) 2-3V LEFT COMPARISON:  07/18/2022 FINDINGS: There is comminuted displaced fracture in left iliac bone along the medial aspect of left acetabulum. There is 8 mm offset in alignment of fracture fragments in the medial aspect of left iliac bone. There is radiolucent line in the lateral cortex of left iliac bone superior to the acetabulum. There   is fracture in left inferior pubic ramus with 5 mm offset in alignment. As far as seen, no fractures are noted in proximal left femur. IMPRESSION: There is no demonstrable fracture in proximal left femur.There is recent comminuted displaced fracture in left iliac bone involving the medial aspect of left acetabulum. Fracture line is involving the lateral cortical margin of left iliac bone superior to the acetabulum. There is recent displaced fracture in the left inferior pubic ramus. Follow-up CT may be considered. Electronically Signed   By: Palani  Rathinasamy M.D.   On: 03/14/2023 14:58    Review of Systems  HENT:  Negative for ear discharge, ear pain, hearing loss and tinnitus.   Eyes:  Negative for photophobia and pain.  Respiratory:  Negative for cough and shortness of breath.   Cardiovascular:  Negative for chest pain.  Gastrointestinal:  Negative for abdominal pain, nausea and vomiting.   Genitourinary:  Negative for dysuria, flank pain, frequency and urgency.  Musculoskeletal:  Positive for arthralgias (Left hip). Negative for back pain, myalgias and neck pain.  Neurological:  Negative for dizziness and headaches.  Hematological:  Does not bruise/bleed easily.  Psychiatric/Behavioral:  The patient is not nervous/anxious.    Blood pressure (!) 160/89, pulse 88, temperature 98 F (36.7 C), temperature source Oral, resp. rate 13, height 4' 10" (1.473 m), weight 76.6 kg, SpO2 94 %. Physical Exam Constitutional:      General: She is not in acute distress.    Appearance: She is well-developed. She is not diaphoretic.  HENT:     Head: Normocephalic and atraumatic.  Eyes:     General: No scleral icterus.       Right eye: No discharge.        Left eye: No discharge.     Conjunctiva/sclera: Conjunctivae normal.  Cardiovascular:     Rate and Rhythm: Normal rate and regular rhythm.  Pulmonary:     Effort: Pulmonary effort is normal. No respiratory distress.  Musculoskeletal:     Cervical back: Normal range of motion.     Comments: LLE No traumatic wounds, ecchymosis, or rash  Mild TTP hip  No knee or ankle effusion  Knee stable to varus/ valgus and anterior/posterior stress  Sens DPN, SPN, TN intact  Motor EHL, ext, flex, evers 5/5  DP 2+, PT 2+, No significant edema  Skin:    General: Skin is warm and dry.  Neurological:     Mental Status: She is alert.  Psychiatric:        Mood and Affect: Mood normal.        Behavior: Behavior normal.    Assessment/Plan: Left acetabulum fx -- Plan ORIF tomorrow with Dr. Haddix. Please keep NPO after MN. Mutliple medical problems including CHF, DM, and hx/o ARDS 2/2 Covid -- per primary service    Sarinity Dicicco J. Ettie Krontz, PA-C Orthopedic Surgery [phone] 03/15/2023, 10:11 AM  

## 2023-03-15 NOTE — Progress Notes (Signed)
PROGRESS NOTE    Kristina [name]  ZOX:[id number] DOB: 12-29-1950 DOA: 03/14/2023 PCP: Benita Stabile, MD   Brief Narrative:  Kristina [name] is a 72 y.o. 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   Assessment & Plan:   Principal Problem:   Acetabular fracture (HCC) Active Problems:   Chronic heart failure with preserved ejection fraction (HFpEF) (HCC)   Chronic respiratory failure with hypoxia (HCC)   Sleep apnea  Assessment and Plan:  Chronic heart failure with preserved ejection fraction (HFpEF) (HCC) Well compensated euvolemic currently HOld Lasix for now Restart Coreg 6.25mg  po BID Hydralazine as needed for severe blood pressure elevations   Acetabular fracture (HCC)  - management as per orthopedics,  plan to operate   in  a.m.  Keep nothing by mouth post midnight. 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  - Hold by mouth medications      Sleep apnea Use CPAP at night   Chronic respiratory failure with hypoxia (HCC) Chronic post-COVID continue use of oxygen as needed Patient states sometimes her oxygen goes down into high 80s and she just takes a few deep breaths and she does better but she does have an oxygen tank at home    Obesity BMI 35.29    DVT prophylaxis:SCDs Code Status: Full Family Communication: None at bedside Disposition Plan:  Status is: Inpatient Remains inpatient appropriate because: Need for IV medications and inpatient procedure.   Consultants:  Orthopedics  Procedures:  None  Antimicrobials:  None   Subjective: Patient seen and evaluated today with no new acute complaints or concerns. No acute concerns or events noted overnight.  She has severe hip pain with movement, otherwise doing well.  Objective: Vitals:   03/14/23 2212 03/14/23 2327 03/14/23 2357 03/15/23 0358  BP: (!) 167/95 (!) 170/82 (!) 164/84 137/72  Pulse: (!) 106 (!) 102 99 89  Resp: 18 (!) 24 (!) 27 13  Temp:  98.2 F (36.8 C)  98.3 F (36.8 C)  TempSrc:  Oral  Oral  SpO2: 91% 94% 92% 94%  Weight:   76.6 kg   Height:   4\' 10"  (1.473 m)     Intake/Output Summary (Last 24 hours) at 03/15/2023 0724 Last data filed at 03/14/2023 2215 Gross per 24 hour  Intake 240 ml  Output 0 ml  Net 240 ml   Filed Weights   03/14/23 2357  Weight: 76.6 kg    Examination:  General exam: Appears calm and comfortable  Respiratory system: Clear to auscultation. Respiratory effort normal.  Nasal cannula Cardiovascular system: S1 & S2 heard, RRR.  Gastrointestinal system: Abdomen is  soft Central nervous system: Alert and awake Extremities: No edema Skin: No significant lesions noted Psychiatry: Flat affect.    Data Reviewed: I have personally reviewed following labs and imaging studies  CBC: Recent Labs  Lab 03/14/23 1340  WBC 8.9  NEUTROABS 6.6  HGB 15.2*  HCT 49.2*  MCV 92.3  PLT 221   Basic Metabolic Panel: Recent Labs  Lab 03/14/23 1340 03/14/23 2054  NA 137  --   K 3.5  --   CL 95*  --   CO2 32  --   GLUCOSE 165*  --   BUN 13  --   CREATININE 0.66  --   CALCIUM 8.8*  --   MG  --  1.9  PHOS  --  3.2   GFR: Estimated Creatinine Clearance: 56.2 mL/min (by C-G formula based on SCr of  0.66 mg/dL). Liver Function Tests: Recent Labs  Lab 03/14/23 2054  AST 30  ALT 17  ALKPHOS 69  BILITOT 0.7  PROT 6.6  ALBUMIN 3.6   No results for input(s): "LIPASE", "AMYLASE" in the last 168 hours. No results for input(s): "AMMONIA" in the last 168 hours. Coagulation Profile: Recent Labs  Lab 03/14/23 2054  INR 0.9   Cardiac Enzymes: Recent Labs  Lab 03/14/23 2054  CKTOTAL 95   BNP (last 3 results) No results for input(s): "PROBNP" in the last 8760 hours. HbA1C: Recent Labs    03/14/23 2054  HGBA1C 5.7*   CBG: Recent Labs  Lab 03/14/23 2000 03/14/23 2141 03/14/23 2355 03/15/23 0359  GLUCAP 241* 206* 170* 133*   Lipid Profile: No results for input(s): "CHOL", "HDL", "LDLCALC", "TRIG", "CHOLHDL", "LDLDIRECT" in the last 72 hours. Thyroid Function Tests: Recent Labs    03/14/23 2054  TSH 0.560   Anemia Panel: No results for input(s): "VITAMINB12", "FOLATE", "FERRITIN", "TIBC", "IRON", "RETICCTPCT" in the last 72 hours. Sepsis Labs: No results for input(s): "PROCALCITON", "LATICACIDVEN" in the last 168 hours.  No results found for this or any previous visit (from the past 240 hour(s)).       Radiology Studies: DG Chest Port 1 View  Result Date: 03/14/2023 CLINICAL DATA:  Preop evaluation EXAM: PORTABLE CHEST 1 VIEW COMPARISON:  04/09/2021 FINDINGS: Transverse diameter of heart is increased. Low lung volumes are noted. Increased interstitial markings are seen in left lower lung field with no interval change. Left hemidiaphragm is elevated. There are no new infiltrates or signs of pulmonary edema. There is no pleural effusion or pneumothorax. IMPRESSION: Linear densities in left lower lung field may suggest scarring or subsegmental atelectasis. Low lung volumes. Left hemidiaphragm is elevated. There are no new infiltrates or signs of pulmonary edema. Electronically Signed   By: Ernie Avena M.D.   On: 03/14/2023 19:28   CT PELVIS WO  CONTRAST  Result Date: 03/14/2023 CLINICAL DATA:  Hip fracture EXAM: CT PELVIS WITHOUT CONTRAST TECHNIQUE: Multidetector CT imaging of the pelvis was performed following the standard protocol without intravenous contrast. RADIATION DOSE REDUCTION: This exam was performed according to the departmental dose-optimization program which includes automated exposure control, adjustment of the mA and/or kV according to patient size and/or use of iterative reconstruction technique. COMPARISON:  X-ray 03/14/2023 and older FINDINGS: Urinary Tract: Preserved contours of the urinary bladder. Bladder is mildly distended. Presumed cysts involving the kidneys of the edge of the imaging field but incompletely evaluated. Please correlate with prior imaging of the area or if needed ultrasound. Bowel: No oral contrast. Stool in the rectum and sigmoid colon.  Significant diverticula of the sigmoid colon. Normal appendix in the right lower quadrant. The visualized small bowel is nondilated. Vascular/Lymphatic: Mild vascular calcifications along the aorta and iliac vessels. No specific abnormal lymph node enlargement identified in the visualized pelvis. Reproductive: Uterus is present. There is a lobular structure in the anterior left adnexa which appears separate from the left ovary. Paraovarian lesion or exophytic pedunculated fibroid is possible. Please correlate for any known history or dedicated workup when appropriate. The structure on series 3, image 24 measures 4.1 by 3.2 cm. Other: No free fluid in the pelvis. There is some hematoma along the extraperitoneal space on the left side in the low pelvis. Musculoskeletal: Acute appearing fracture involving the left inferior pubic ramus which is comminuted. There also fracture lines along the anterior aspect of the left acetabulum. Comminuted fracture fragments extend along the medial portion of the acetabulum, posterior and superior. Fracture lines extend along the left side of the  iliac bone inferiorly. Please correlate with the time course of injury. Mild degenerative changes of the visualized portions of the spine and sacroiliac joints. Sclerosis along the pubic symphysis. Focal asymmetric thickening of the left iliacus muscle and obturator internus. Possible associated hematoma. IMPRESSION: Comminuted fracture identified involving the left acetabulum with fracture lines diffusely along all aspects of the acetabulum. Extension of fracture lines along the inferior left iliac bone. Separate inferior pubic ramus fracture on the left. Surrounding associated hematoma in the extraperitoneal space and involving the musculature of the iliacus muscle and obturator internus. 4.1 cm soft tissue nodule in the left adnexa anterior to the left side of the uterus and ovary. Possibilities would include a paraovarian lesion versus an exophytic uterine fibroid. Please correlate for any known history or dedicated workup when appropriate Electronically Signed   By: Karen Kays M.D.   On: 03/14/2023 16:07   DG Hip Unilat With Pelvis 2-3 Views Left  Result Date: 03/14/2023 CLINICAL DATA:  Trauma, fall EXAM: DG HIP (WITH OR WITHOUT PELVIS) 2-3V LEFT COMPARISON:  07/18/2022 FINDINGS: There is comminuted displaced fracture in left iliac bone along the medial aspect of left acetabulum. There is 8 mm offset in alignment of fracture fragments in the medial aspect of left iliac bone. There is radiolucent line in the lateral cortex of left iliac bone superior to the acetabulum. There is fracture in left inferior pubic ramus with 5 mm offset in alignment. As far as seen, no fractures are noted in proximal left femur. IMPRESSION: There is no demonstrable fracture in proximal left femur.There is recent comminuted displaced fracture in left iliac bone involving the medial aspect of left acetabulum. Fracture line is involving the lateral cortical margin of left iliac bone superior to the acetabulum. There is recent  displaced fracture in the left inferior pubic ramus. Follow-up CT may be considered. Electronically Signed   By: Ernie Avena M.D.   On: 03/14/2023 14:58        Scheduled Meds:  carvedilol  6.25 mg Oral BID WC   insulin aspart  0-9 Units Subcutaneous Q4H   pantoprazole  40 mg Oral Daily   rosuvastatin  10 mg Oral Daily   Continuous Infusions:  methocarbamol (ROBAXIN) IV       LOS: 1 day    Time spent: 35 minutes    Jerrett Baldinger Hoover Brunette, DO [facility]  If 7PM-7AM, please contact night-coverage [email or web address] 03/15/2023, 7:24 AM

## 2023-03-15 NOTE — Consult Note (Signed)
Reason for Consult:Left acetabulum fx Referring Physician: Maurilio Lovely Time called: 1610 Time at bedside: 1005   Kristina [name] is an 72 y.o. female.  HPI: Ermer was stepping off a curb, lost her balance, and fell. She had immediate left hip pain and could not get up. She was brought to the ED where workup showed a left acetabulum fx and orthopedic surgery was consulted. She lives at home with her husband and does not use any assistive devices to ambulate.  Past Medical History:  Diagnosis Date   Acute respiratory distress syndrome (ARDS) due to COVID-19 virus (HCC) 09/2019   Chronic combined systolic and diastolic CHF (congestive heart failure) (HCC)    Diabetes mellitus, type 2 (HCC)    GERD (gastroesophageal reflux disease)    Hyperlipidemia    Hypertension     Past Surgical History:  Procedure Laterality Date   COLONOSCOPY N/A 12/21/2016   Procedure: COLONOSCOPY;  Surgeon: Malissa Hippo, MD;  Location: [facility];  Service: Endoscopy;  Laterality: N/A;  930   NO PAST SURGERIES      Family History  Problem Relation Age of Onset   Breast cancer Mother    Heart attack Father    Colon cancer Neg Hx     Social History:  reports that she has never smoked. She has never used smokeless tobacco. She reports that she does not drink alcohol and does not use drugs.  Allergies: No Known Allergies  Medications: I have reviewed the patient's current medications.  Results for orders placed or performed during the hospital encounter of 03/14/23 (from the past 48 hour(s))  Basic metabolic panel     Status: Abnormal   Collection Time: 03/14/23  1:40 PM  Result Value Ref Range   Sodium 137 135 - 145 mmol/L   Potassium 3.5 3.5 - 5.1 mmol/L   Chloride 95 (L) 98 - 111 mmol/L   CO2 32 22 - 32 mmol/L   Glucose, Bld 165 (H) 70 - 99 mg/dL    Comment: Glucose reference range applies only to samples taken after fasting for at least 8 hours.   BUN 13 8 - 23 mg/dL   Creatinine, Ser  9.60 0.44 - 1.00 mg/dL   Calcium 8.8 (L) 8.9 - 10.3 mg/dL   GFR, Estimated >45 >40 mL/min    Comment: (NOTE) Calculated using the CKD-EPI Creatinine Equation (2021)    Anion gap 10 5 - 15    Comment: Performed at [facility], [address], [place], [place] [address]  CBC with Differential     Status: Abnormal   Collection Time: 03/14/23  1:40 PM  Result Value Ref Range   WBC 8.9 4.0 - 10.5 K/uL   RBC 5.33 (H) 3.87 - 5.11 MIL/uL   Hemoglobin 15.2 (H) 12.0 - 15.0 g/dL   HCT 14.7 (H) 82.9 - 56.2 %   MCV 92.3 80.0 - 100.0 fL   MCH 28.5 26.0 - 34.0 pg   MCHC 30.9 30.0 - 36.0 g/dL   RDW 13.0 86.5 - 78.4 %   Platelets 221 150 - 400 K/uL   nRBC 0.0 0.0 - 0.2 %   Neutrophils Relative % 73 %   Neutro Abs 6.6 1.7 - 7.7 K/uL   Lymphocytes Relative 17 %   Lymphs Abs 1.5 0.7 - 4.0 K/uL   Monocytes Relative 7 %   Monocytes Absolute 0.6 0.1 - 1.0 K/uL   Eosinophils Relative 1 %   Eosinophils Absolute 0.1 0.0 -  0.5 K/uL   Basophils Relative 1 %   Basophils Absolute 0.1 0.0 - 0.1 K/uL   Immature Granulocytes 1 %   Abs Immature Granulocytes 0.06 0.00 - 0.07 K/uL    Comment: Performed at [facility], [address] [place], [place] [address]  CBG monitoring, ED     Status: Abnormal   Collection Time: 03/14/23  8:00 PM  Result Value Ref Range   Glucose-Capillary 241 (H) 70 - 99 mg/dL    Comment: Glucose reference range applies only to samples taken after fasting for at least 8 hours.  Hemoglobin A1c     Status: Abnormal   Collection Time: 03/14/23  8:54 PM  Result Value Ref Range   Hgb A1c MFr Bld 5.7 (H) 4.8 - 5.6 %    Comment: (NOTE) Pre diabetes:          5.7%-6.4%  Diabetes:              >6.4%  Glycemic control for   <7.0% adults with diabetes    Mean Plasma Glucose 116.89 mg/dL    Comment: Performed at [facility], [address], [place], [place] [address]  Hepatic function panel     Status: None   Collection Time: 03/14/23  8:54 PM  Result Value Ref  Range   Total Protein 6.6 6.5 - 8.1 g/dL   Albumin 3.6 3.5 - 5.0 g/dL   AST 30 15 - 41 U/L   ALT 17 0 - 44 U/L   Alkaline Phosphatase 69 38 - 126 U/L   Total Bilirubin 0.7 0.3 - 1.2 mg/dL   Bilirubin, Direct 0.2 0.0 - 0.2 mg/dL   Indirect Bilirubin 0.5 0.3 - 0.9 mg/dL    Comment: Performed at [facility], [address] [place], [place] [address]  Magnesium     Status: None   Collection Time: 03/14/23  8:54 PM  Result Value Ref Range   Magnesium 1.9 1.7 - 2.4 mg/dL    Comment: Performed at [facility], [address] [place], [place] [address]  Phosphorus     Status: None   Collection Time: 03/14/23  8:54 PM  Result Value Ref Range   Phosphorus 3.2 2.5 - 4.6 mg/dL    Comment: Performed at [facility], [address], [place], [place] [address]  CK     Status: None   Collection Time: 03/14/23  8:54 PM  Result Value Ref Range   Total CK 95 38 - 234 U/L    Comment: Performed at [facility], [address] [place], [place] [address]  Protime-INR     Status: None   Collection Time: 03/14/23  8:54 PM  Result Value Ref Range   Prothrombin Time 12.8 11.4 - 15.2 seconds   INR 0.9 0.8 - 1.2    Comment: (NOTE) INR goal varies based on device and disease states. Performed at [facility], [address] [place], [place] [address]   TSH     Status: None   Collection Time: 03/14/23  8:54 PM  Result Value Ref Range   TSH 0.560 0.350 - 4.500 uIU/mL    Comment: Performed by a 3rd Generation assay with a functional sensitivity of <=0.01 uIU/mL. Performed at [facility], [address], [place], [place] [address]   VITAMIN D 25 Hydroxy (Vit-D Deficiency, Fractures)     Status: Abnormal   Collection Time: 03/14/23  8:54 PM  Result Value Ref Range   Vit D, 25-Hydroxy 29.31 (L) 30 - 100 ng/mL    Comment: (NOTE) Vitamin D deficiency has been defined by the Institute of Medicine  and an Endocrine Society practice guideline as a level of serum 25-OH   vitamin D less than 20 ng/mL (1,2). The Endocrine Society went on to  further define vitamin D insufficiency as a level between 21 and 29  ng/mL (2).  1. IOM (Institute of Medicine). 2010. Dietary reference intakes for  calcium and D. [place]: [employer]. 2. Holick MF, Binkley [place], Bischoff-Ferrari HA, et al. Evaluation,  treatment, and prevention of vitamin D deficiency: an Endocrine  Society clinical practice guideline, JCEM. 2011 Jul; 96(7): 1911-30.  Performed at [facility], [address], [place], [place] [address]   Glucose, capillary     Status: Abnormal   Collection Time: 03/14/23  9:41 PM  Result Value Ref Range   Glucose-Capillary 206 (H) 70 - 99 mg/dL    Comment: Glucose reference range applies only to samples taken after fasting for at least 8 hours.  Troponin I (High Sensitivity)     Status: None   Collection Time: 03/14/23 10:12 PM  Result Value Ref Range   Troponin I (High Sensitivity) 8 <18 ng/L    Comment: (NOTE) Elevated high sensitivity troponin I (hsTnI) values and significant  changes across serial measurements may suggest ACS but many other  chronic and acute conditions are known to elevate hsTnI results.  Refer to the "Links" section for chest pain algorithms and additional  guidance. Performed at [facility], [address] [place], [place] [address]   Type and screen     Status: None   Collection Time: 03/14/23 10:17 PM  Result Value Ref Range   ABO/RH(D) O POS    Antibody Screen NEG    Sample Expiration      03/17/2023,2359 Performed at [facility], [address] [place], [place] [address]   Glucose, capillary     Status: Abnormal   Collection Time: 03/14/23 11:55 PM  Result Value Ref Range   Glucose-Capillary 170 (H) 70 - 99 mg/dL    Comment: Glucose reference range applies only to samples taken after fasting for at least 8 hours.   Comment 1 Notify RN    Comment 2 Document in Chart    Prealbumin     Status: None   Collection Time: 03/15/23  1:12 AM  Result Value Ref Range   Prealbumin 21 18 - 38 mg/dL    Comment: Performed at [facility], [address] [place], [place] [address]  Glucose, capillary     Status: Abnormal   Collection Time: 03/15/23  3:59 AM  Result Value Ref Range   Glucose-Capillary 133 (H) 70 - 99 mg/dL    Comment: Glucose reference range applies only to samples taken after fasting for at least 8 hours.  Glucose, capillary     Status: Abnormal   Collection Time: 03/15/23  7:35 AM  Result Value Ref Range   Glucose-Capillary 138 (H) 70 - 99 mg/dL    Comment: Glucose reference range applies only to samples taken after fasting for at least 8 hours.    DG Chest Port 1 View  Result Date: 03/14/2023 CLINICAL DATA:  Preop evaluation EXAM: PORTABLE CHEST 1 VIEW COMPARISON:  04/09/2021 FINDINGS: Transverse diameter of heart is increased. Low lung volumes are noted. Increased interstitial markings are seen in left  lower lung field with no interval change. Left hemidiaphragm is elevated. There are no new infiltrates or signs of pulmonary edema. There is no pleural effusion or pneumothorax. IMPRESSION: Linear densities in left lower lung field may suggest scarring or subsegmental atelectasis. Low lung volumes. Left hemidiaphragm is elevated. There are no new infiltrates or signs of pulmonary edema. Electronically Signed   By: Ernie Avena M.D.   On: 03/14/2023 19:28   CT PELVIS WO CONTRAST  Result Date: 03/14/2023 CLINICAL DATA:  Hip fracture EXAM: CT PELVIS WITHOUT CONTRAST TECHNIQUE: Multidetector CT imaging of the pelvis was performed following the standard protocol without intravenous contrast. RADIATION DOSE REDUCTION: This exam was performed according to the departmental dose-optimization program which includes automated exposure control, adjustment of the mA and/or kV according to patient size and/or use of iterative reconstruction technique.  COMPARISON:  X-ray 03/14/2023 and older FINDINGS: Urinary Tract: Preserved contours of the urinary bladder. Bladder is mildly distended. Presumed cysts involving the kidneys of the edge of the imaging field but incompletely evaluated. Please correlate with prior imaging of the area or if needed ultrasound. Bowel: No oral contrast. Stool in the rectum and sigmoid colon. Significant diverticula of the sigmoid colon. Normal appendix in the right lower quadrant. The visualized small bowel is nondilated. Vascular/Lymphatic: Mild vascular calcifications along the aorta and iliac vessels. No specific abnormal lymph node enlargement identified in the visualized pelvis. Reproductive: Uterus is present. There is a lobular structure in the anterior left adnexa which appears separate from the left ovary. Paraovarian lesion or exophytic pedunculated fibroid is possible. Please correlate for any known history or dedicated workup when appropriate. The structure on series 3, image 24 measures 4.1 by 3.2 cm. Other: No free fluid in the pelvis. There is some hematoma along the extraperitoneal space on the left side in the low pelvis. Musculoskeletal: Acute appearing fracture involving the left inferior pubic ramus which is comminuted. There also fracture lines along the anterior aspect of the left acetabulum. Comminuted fracture fragments extend along the medial portion of the acetabulum, posterior and superior. Fracture lines extend along the left side of the iliac bone inferiorly. Please correlate with the time course of injury. Mild degenerative changes of the visualized portions of the spine and sacroiliac joints. Sclerosis along the pubic symphysis. Focal asymmetric thickening of the left iliacus muscle and obturator internus. Possible associated hematoma. IMPRESSION: Comminuted fracture identified involving the left acetabulum with fracture lines diffusely along all aspects of the acetabulum. Extension of fracture lines along  the inferior left iliac bone. Separate inferior pubic ramus fracture on the left. Surrounding associated hematoma in the extraperitoneal space and involving the musculature of the iliacus muscle and obturator internus. 4.1 cm soft tissue nodule in the left adnexa anterior to the left side of the uterus and ovary. Possibilities would include a paraovarian lesion versus an exophytic uterine fibroid. Please correlate for any known history or dedicated workup when appropriate Electronically Signed   By: Karen Kays M.D.   On: 03/14/2023 16:07   DG Hip Unilat With Pelvis 2-3 Views Left  Result Date: 03/14/2023 CLINICAL DATA:  Trauma, fall EXAM: DG HIP (WITH OR WITHOUT PELVIS) 2-3V LEFT COMPARISON:  07/18/2022 FINDINGS: There is comminuted displaced fracture in left iliac bone along the medial aspect of left acetabulum. There is 8 mm offset in alignment of fracture fragments in the medial aspect of left iliac bone. There is radiolucent line in the lateral cortex of left iliac bone superior to the acetabulum. There  is fracture in left inferior pubic ramus with 5 mm offset in alignment. As far as seen, no fractures are noted in proximal left femur. IMPRESSION: There is no demonstrable fracture in proximal left femur.There is recent comminuted displaced fracture in left iliac bone involving the medial aspect of left acetabulum. Fracture line is involving the lateral cortical margin of left iliac bone superior to the acetabulum. There is recent displaced fracture in the left inferior pubic ramus. Follow-up CT may be considered. Electronically Signed   By: Ernie Avena M.D.   On: 03/14/2023 14:58    Review of Systems  HENT:  Negative for ear discharge, ear pain, hearing loss and tinnitus.   Eyes:  Negative for photophobia and pain.  Respiratory:  Negative for cough and shortness of breath.   Cardiovascular:  Negative for chest pain.  Gastrointestinal:  Negative for abdominal pain, nausea and vomiting.   Genitourinary:  Negative for dysuria, flank pain, frequency and urgency.  Musculoskeletal:  Positive for arthralgias (Left hip). Negative for back pain, myalgias and neck pain.  Neurological:  Negative for dizziness and headaches.  Hematological:  Does not bruise/bleed easily.  Psychiatric/Behavioral:  The patient is not nervous/anxious.    Blood pressure (!) 160/89, pulse 88, temperature 98 F (36.7 C), temperature source Oral, resp. rate 13, height 4\' 10"  (1.473 m), weight 76.6 kg, SpO2 94 %. Physical Exam Constitutional:      General: She is not in acute distress.    Appearance: She is well-developed. She is not diaphoretic.  HENT:     Head: Normocephalic and atraumatic.  Eyes:     General: No scleral icterus.       Right eye: No discharge.        Left eye: No discharge.     Conjunctiva/sclera: Conjunctivae normal.  Cardiovascular:     Rate and Rhythm: Normal rate and regular rhythm.  Pulmonary:     Effort: Pulmonary effort is normal. No respiratory distress.  Musculoskeletal:     Cervical back: Normal range of motion.     Comments: LLE No traumatic wounds, ecchymosis, or rash  Mild TTP hip  No knee or ankle effusion  Knee stable to varus/ valgus and anterior/posterior stress  Sens DPN, SPN, TN intact  Motor EHL, ext, flex, evers 5/5  DP 2+, PT 2+, No significant edema  Skin:    General: Skin is warm and dry.  Neurological:     Mental Status: She is alert.  Psychiatric:        Mood and Affect: Mood normal.        Behavior: Behavior normal.    Assessment/Plan: Left acetabulum fx -- Plan ORIF tomorrow with Dr. Jena Gauss. Please keep NPO after MN. Mutliple medical problems including CHF, DM, and hx/o ARDS 2/2 Covid -- per primary service    Freeman Caldron, PA-C Orthopedic Surgery [phone] 03/15/2023, 10:11 AM

## 2023-03-15 NOTE — Progress Notes (Signed)
Echocardiogram 2D Echocardiogram has been performed.  Lucendia Herrlich 03/15/2023, 10:11 AM

## 2023-03-16 ENCOUNTER — Inpatient Hospital Stay (HOSPITAL_COMMUNITY): Payer: Medicare HMO | Admitting: Certified Registered"

## 2023-03-16 ENCOUNTER — Encounter (HOSPITAL_COMMUNITY)
Admission: EM | Disposition: A | Discharge status: Skilled Nursing Facility | Payer: Self-pay | Source: Home / Self Care | Attending: Internal Medicine

## 2023-03-16 ENCOUNTER — Encounter (HOSPITAL_COMMUNITY): Payer: Self-pay | Admitting: Internal Medicine

## 2023-03-16 ENCOUNTER — Inpatient Hospital Stay (HOSPITAL_COMMUNITY): Payer: Medicare HMO

## 2023-03-16 ENCOUNTER — Other Ambulatory Visit: Payer: Self-pay

## 2023-03-16 DIAGNOSIS — I110 Hypertensive heart disease with heart failure: Secondary | ICD-10-CM | POA: Diagnosis not present

## 2023-03-16 DIAGNOSIS — E119 Type 2 diabetes mellitus without complications: Secondary | ICD-10-CM | POA: Diagnosis not present

## 2023-03-16 DIAGNOSIS — S32402A Unspecified fracture of left acetabulum, initial encounter for closed fracture: Secondary | ICD-10-CM

## 2023-03-16 DIAGNOSIS — I509 Heart failure, unspecified: Secondary | ICD-10-CM

## 2023-03-16 HISTORY — PX: ORIF ACETABULAR FRACTURE: SHX5029

## 2023-03-16 LAB — BASIC METABOLIC PANEL
Anion gap: 11 (ref 5–15)
BUN: 17 mg/dL (ref 8–23)
CO2: 33 mmol/L — ABNORMAL HIGH (ref 22–32)
Calcium: 8.9 mg/dL (ref 8.9–10.3)
Chloride: 95 mmol/L — ABNORMAL LOW (ref 98–111)
Creatinine, Ser: 0.63 mg/dL (ref 0.44–1.00)
GFR, Estimated: >60 mL/min (ref >60)
Glucose, Bld: 154 mg/dL — ABNORMAL HIGH (ref 70–99)
Potassium: 3.7 mmol/L (ref 3.5–5.1)
Sodium: 139 mmol/L (ref 135–145)

## 2023-03-16 LAB — GLUCOSE, CAPILLARY
Glucose-Capillary: 149 mg/dL — ABNORMAL HIGH (ref 70–99)
Glucose-Capillary: 153 mg/dL — ABNORMAL HIGH (ref 70–99)
Glucose-Capillary: 153 mg/dL — ABNORMAL HIGH (ref 70–99)
Glucose-Capillary: 157 mg/dL — ABNORMAL HIGH (ref 70–99)
Glucose-Capillary: 157 mg/dL — ABNORMAL HIGH (ref 70–99)
Glucose-Capillary: 166 mg/dL — ABNORMAL HIGH (ref 70–99)
Glucose-Capillary: 167 mg/dL — ABNORMAL HIGH (ref 70–99)
Glucose-Capillary: 168 mg/dL — ABNORMAL HIGH (ref 70–99)

## 2023-03-16 LAB — CBC
HCT: 46.7 % — ABNORMAL HIGH (ref 36.0–46.0)
HCT: 42.1 % (ref 36.0–46.0)
Hemoglobin: 13.8 g/dL (ref 12.0–15.0)
Hemoglobin: 12.7 g/dL (ref 12.0–15.0)
MCH: 28.4 pg (ref 26.0–34.0)
MCH: 28.9 pg (ref 26.0–34.0)
MCHC: 29.6 g/dL — ABNORMAL LOW (ref 30.0–36.0)
MCHC: 30.2 g/dL (ref 30.0–36.0)
MCV: 96.1 fL (ref 80.0–100.0)
MCV: 95.9 fL (ref 80.0–100.0)
Platelets: 189 10*3/uL (ref 150–400)
Platelets: 179 10*3/uL (ref 150–400)
RBC: 4.86 MIL/uL (ref 3.87–5.11)
RBC: 4.39 MIL/uL (ref 3.87–5.11)
RDW: 14.3 % (ref 11.5–15.5)
RDW: 14.2 % (ref 11.5–15.5)
WBC: 13.2 10*3/uL — ABNORMAL HIGH (ref 4.0–10.5)
WBC: 14 10*3/uL — ABNORMAL HIGH (ref 4.0–10.5)
nRBC: 0 % (ref 0.0–0.2)
nRBC: 0 % (ref 0.0–0.2)

## 2023-03-16 LAB — CREATININE, SERUM
Creatinine, Ser: 0.52 mg/dL (ref 0.44–1.00)
GFR, Estimated: >60 mL/min (ref >60)

## 2023-03-16 LAB — MAGNESIUM: Magnesium: 2.3 mg/dL (ref 1.7–2.4)

## 2023-03-16 SURGERY — OPEN REDUCTION INTERNAL FIXATION (ORIF) ACETABULAR FRACTURE
Anesthesia: Spinal | Site: Pelvis | Laterality: Left

## 2023-03-16 MED ORDER — FENTANYL CITRATE (PF) 100 MCG/2ML IJ SOLN
25.0000 ug | INTRAMUSCULAR | Status: DC | PRN
  Administered 2023-03-16: 25 ug via INTRAVENOUS

## 2023-03-16 MED ORDER — TRANEXAMIC ACID-NACL 1000-0.7 MG/100ML-% IV SOLN: 1000.0000 mg | INTRAVENOUS | Status: DC

## 2023-03-16 MED ORDER — DEXAMETHASONE SODIUM PHOSPHATE 10 MG/ML IJ SOLN
INTRAMUSCULAR | Status: DC | PRN
  Administered 2023-03-16: 10 mg via INTRAVENOUS

## 2023-03-16 MED ORDER — ROCURONIUM BROMIDE 10 MG/ML (PF) SYRINGE
PREFILLED_SYRINGE | INTRAVENOUS | Status: AC
  Filled 2023-03-16: qty 10

## 2023-03-16 MED ORDER — METOCLOPRAMIDE HCL 5 MG PO TABS: 5.0000 mg | ORAL_TABLET | Freq: Three times a day (TID) | ORAL | Status: DC | PRN

## 2023-03-16 MED ORDER — CHLORHEXIDINE GLUCONATE 0.12 % MT SOLN
OROMUCOSAL | Status: AC
  Administered 2023-03-16: 15 mL
  Filled 2023-03-16: qty 15

## 2023-03-16 MED ORDER — PHENYLEPHRINE 80 MCG/ML (10ML) SYRINGE FOR IV PUSH (FOR BLOOD PRESSURE SUPPORT)
PREFILLED_SYRINGE | INTRAVENOUS | Status: DC | PRN
  Administered 2023-03-16 (×5): 200 ug via INTRAVENOUS

## 2023-03-16 MED ORDER — METOCLOPRAMIDE HCL 5 MG/ML IJ SOLN: 5.0000 mg | Freq: Three times a day (TID) | INTRAMUSCULAR | Status: DC | PRN

## 2023-03-16 MED ORDER — PHENYLEPHRINE HCL-NACL 20-0.9 MG/250ML-% IV SOLN
INTRAVENOUS | Status: DC | PRN
  Administered 2023-03-16: 50 ug/min via INTRAVENOUS

## 2023-03-16 MED ORDER — LACTATED RINGERS IV SOLN: INTRAVENOUS | Status: DC | PRN

## 2023-03-16 MED ORDER — 0.9 % SODIUM CHLORIDE (POUR BTL) OPTIME
TOPICAL | Status: DC | PRN
  Administered 2023-03-16: 1000 mL

## 2023-03-16 MED ORDER — CEFAZOLIN SODIUM-DEXTROSE 2-4 GM/100ML-% IV SOLN
2.0000 g | Freq: Three times a day (TID) | INTRAVENOUS | Status: AC
  Administered 2023-03-16 – 2023-03-17 (×3): 2 g via INTRAVENOUS
  Filled 2023-03-16 (×3): qty 100

## 2023-03-16 MED ORDER — CHLORHEXIDINE GLUCONATE 4 % EX SOLN
60.0000 mL | Freq: Once | CUTANEOUS | Status: AC
  Administered 2023-03-16: 4 via TOPICAL
  Filled 2023-03-16: qty 60

## 2023-03-16 MED ORDER — CEFAZOLIN SODIUM-DEXTROSE 2-4 GM/100ML-% IV SOLN
2.0000 g | INTRAVENOUS | Status: AC
  Administered 2023-03-16: 2 g via INTRAVENOUS
  Filled 2023-03-16: qty 100

## 2023-03-16 MED ORDER — POVIDONE-IODINE 10 % EX SWAB: 2.0000 | Freq: Once | CUTANEOUS | Status: DC

## 2023-03-16 MED ORDER — PROPOFOL 10 MG/ML IV BOLUS
INTRAVENOUS | Status: DC | PRN
  Administered 2023-03-16: 30 mg via INTRAVENOUS

## 2023-03-16 MED ORDER — ALBUMIN HUMAN 5 % IV SOLN: INTRAVENOUS | Status: DC | PRN

## 2023-03-16 MED ORDER — ONDANSETRON HCL 4 MG/2ML IJ SOLN
INTRAMUSCULAR | Status: DC | PRN
  Administered 2023-03-16: 4 mg via INTRAVENOUS

## 2023-03-16 MED ORDER — EPHEDRINE SULFATE-NACL 50-0.9 MG/10ML-% IV SOSY
PREFILLED_SYRINGE | INTRAVENOUS | Status: DC | PRN
  Administered 2023-03-16: 10 mg via INTRAVENOUS

## 2023-03-16 MED ORDER — FENTANYL CITRATE (PF) 100 MCG/2ML IJ SOLN
INTRAMUSCULAR | Status: AC
  Filled 2023-03-16: qty 2

## 2023-03-16 MED ORDER — ACETAMINOPHEN 500 MG PO TABS
ORAL_TABLET | ORAL | Status: AC
  Administered 2023-03-16: 1000 mg via ORAL
  Filled 2023-03-16: qty 2

## 2023-03-16 MED ORDER — SODIUM CHLORIDE 0.9 % IR SOLN
Status: DC | PRN
  Administered 2023-03-16: 1000 mL

## 2023-03-16 MED ORDER — EPHEDRINE 5 MG/ML INJ
INTRAVENOUS | Status: AC
  Filled 2023-03-16: qty 5

## 2023-03-16 MED ORDER — DOCUSATE SODIUM 100 MG PO CAPS
100.0000 mg | ORAL_CAPSULE | Freq: Two times a day (BID) | ORAL | Status: DC
  Administered 2023-03-16 – 2023-03-20 (×9): 100 mg via ORAL
  Filled 2023-03-16 (×9): qty 1

## 2023-03-16 MED ORDER — ACETAMINOPHEN 325 MG PO TABS
325.0000 mg | ORAL_TABLET | Freq: Four times a day (QID) | ORAL | Status: DC | PRN
  Administered 2023-03-20: 650 mg via ORAL
  Filled 2023-03-16: qty 2

## 2023-03-16 MED ORDER — FENTANYL CITRATE (PF) 250 MCG/5ML IJ SOLN
INTRAMUSCULAR | Status: AC
  Filled 2023-03-16: qty 5

## 2023-03-16 MED ORDER — TRANEXAMIC ACID-NACL 1000-0.7 MG/100ML-% IV SOLN
1000.0000 mg | Freq: Once | INTRAVENOUS | Status: AC
  Administered 2023-03-16: 1000 mg via INTRAVENOUS
  Filled 2023-03-16: qty 100

## 2023-03-16 MED ORDER — FENTANYL CITRATE (PF) 250 MCG/5ML IJ SOLN
INTRAMUSCULAR | Status: DC | PRN
  Administered 2023-03-16 (×2): 50 ug via INTRAVENOUS

## 2023-03-16 MED ORDER — ONDANSETRON HCL 4 MG PO TABS: 4.0000 mg | ORAL_TABLET | Freq: Four times a day (QID) | ORAL | Status: DC | PRN

## 2023-03-16 MED ORDER — ENOXAPARIN SODIUM 40 MG/0.4ML IJ SOSY
40.0000 mg | PREFILLED_SYRINGE | INTRAMUSCULAR | Status: DC
  Administered 2023-03-17 – 2023-03-20 (×4): 40 mg via SUBCUTANEOUS
  Filled 2023-03-16 (×4): qty 0.4

## 2023-03-16 MED ORDER — HYDROCODONE-ACETAMINOPHEN 7.5-325 MG PO TABS: 1.0000 | ORAL_TABLET | ORAL | Status: DC | PRN

## 2023-03-16 MED ORDER — ONDANSETRON HCL 4 MG/2ML IJ SOLN: 4.0000 mg | Freq: Four times a day (QID) | INTRAMUSCULAR | Status: DC | PRN

## 2023-03-16 MED ORDER — VANCOMYCIN HCL 1 G IV SOLR
INTRAVENOUS | Status: DC | PRN
  Administered 2023-03-16: 1000 mg via TOPICAL

## 2023-03-16 MED ORDER — SODIUM CHLORIDE 0.9 % IV SOLN: INTRAVENOUS | Status: DC

## 2023-03-16 MED ORDER — PROPOFOL 500 MG/50ML IV EMUL
INTRAVENOUS | Status: DC | PRN
  Administered 2023-03-16: 25 ug/kg/min via INTRAVENOUS

## 2023-03-16 MED ORDER — BUPIVACAINE IN DEXTROSE 0.75-8.25 % IT SOLN
INTRATHECAL | Status: DC | PRN
  Administered 2023-03-16: 2 mL via INTRATHECAL

## 2023-03-16 MED ORDER — PROPOFOL 10 MG/ML IV BOLUS
INTRAVENOUS | Status: AC
  Filled 2023-03-16: qty 20

## 2023-03-16 MED ORDER — LIDOCAINE 2% (20 MG/ML) 5 ML SYRINGE
INTRAMUSCULAR | Status: AC
  Filled 2023-03-16: qty 5

## 2023-03-16 MED ORDER — ACETAMINOPHEN 500 MG PO TABS: 1000.0000 mg | ORAL_TABLET | Freq: Once | ORAL | Status: AC

## 2023-03-16 MED ORDER — AMISULPRIDE (ANTIEMETIC) 5 MG/2ML IV SOLN: 10.0000 mg | Freq: Once | INTRAVENOUS | Status: DC | PRN

## 2023-03-16 MED ORDER — POLYETHYLENE GLYCOL 3350 17 G PO PACK
17.0000 g | PACK | Freq: Every day | ORAL | Status: DC | PRN
  Administered 2023-03-19: 17 g via ORAL
  Filled 2023-03-16: qty 1

## 2023-03-16 SURGICAL SUPPLY — 72 items
ADH SKN CLS APL DERMABOND .7 (GAUZE/BANDAGES/DRESSINGS) ×1
ADH SKNCLS APL OCTYL .7 VIOL (GAUZE/BANDAGES/DRESSINGS) ×1
APL PRP STRL LF DISP 70% ISPRP (MISCELLANEOUS) ×1
APPLIER CLIP 11 MED OPEN (CLIP) ×1
APR CLP MED 11 20 MLT OPN (CLIP) ×1
BAG COUNTER SPONGE SURGICOUNT (BAG) ×1 IMPLANT
BAG SPNG CNTER NS LX DISP (BAG) ×1
BIT DRILL AO MATTA 2.5MX230M (BIT) IMPLANT
BIT DRILL STEP 3.5 (DRILL) IMPLANT
BLADE CLIPPER SURG (BLADE) IMPLANT
BRUSH SCRUB EZ PLAIN DRY (MISCELLANEOUS) ×2 IMPLANT
CHLORAPREP W/TINT 26 (MISCELLANEOUS) ×1 IMPLANT
CLIP APPLIE 11 MED OPEN (CLIP) IMPLANT
COVER SURGICAL LIGHT HANDLE (MISCELLANEOUS) ×1 IMPLANT
DERMABOND ADVANCED .7 DNX12 (GAUZE/BANDAGES/DRESSINGS) IMPLANT
DRAPE C-ARM 42X72 X-RAY (DRAPES) ×1 IMPLANT
DRAPE C-ARMOR (DRAPES) ×1 IMPLANT
DRAPE INCISE IOBAN 66X45 STRL (DRAPES) ×1 IMPLANT
DRAPE INCISE IOBAN 85X60 (DRAPES) ×1 IMPLANT
DRAPE ORTHO SPLIT 77X108 STRL (DRAPES) ×2
DRAPE SURG ORHT 6 SPLT 77X108 (DRAPES) ×2 IMPLANT
DRAPE U-SHAPE 47X51 STRL (DRAPES) ×1 IMPLANT
DRILL BIT AO MATTA 2.5MX230M (BIT) ×1
DRILL STEP 3.5 (DRILL)
DRSG MEPILEX POST OP 4X12 (GAUZE/BANDAGES/DRESSINGS) IMPLANT
DRSG MEPILEX POST OP 4X8 (GAUZE/BANDAGES/DRESSINGS) IMPLANT
ELECT BLADE 6.5 EXT (BLADE) ×1 IMPLANT
ELECT REM PT RETURN 9FT ADLT (ELECTROSURGICAL) ×1
ELECTRODE REM PT RTRN 9FT ADLT (ELECTROSURGICAL) ×1 IMPLANT
GLOVE BIO SURGEON STRL SZ 6.5 (GLOVE) ×3 IMPLANT
GLOVE BIO SURGEON STRL SZ7.5 (GLOVE) ×4 IMPLANT
GLOVE BIOGEL PI IND STRL 6.5 (GLOVE) ×1 IMPLANT
GLOVE BIOGEL PI IND STRL 7.5 (GLOVE) ×1 IMPLANT
GOWN STRL REUS W/ TWL LRG LVL3 (GOWN DISPOSABLE) ×2 IMPLANT
GOWN STRL REUS W/TWL LRG LVL3 (GOWN DISPOSABLE) ×2
HANDPIECE INTERPULSE COAX TIP (DISPOSABLE) ×1
KIT BASIN OR (CUSTOM PROCEDURE TRAY) ×1 IMPLANT
KIT TURNOVER KIT B (KITS) ×1 IMPLANT
MANIFOLD NEPTUNE II (INSTRUMENTS) ×1 IMPLANT
NS IRRIG 1000ML POUR BTL (IV SOLUTION) ×1 IMPLANT
PACK TOTAL JOINT (CUSTOM PROCEDURE TRAY) ×1 IMPLANT
PAD ARMBOARD 7.5X6 YLW CONV (MISCELLANEOUS) ×2 IMPLANT
PLATE SUPRAPECTINEAL L (Plate) IMPLANT
RETRIEVER SUT HEWSON (MISCELLANEOUS) ×1 IMPLANT
SCREW CORT 2.5XFT 44X3.5XST (Screw) IMPLANT
SCREW CORTEX ST MATTA 3.5X22MM (Screw) IMPLANT
SCREW CORTEX ST MATTA 3.5X32MM (Screw) IMPLANT
SCREW CORTEX ST MATTA 3.5X36MM (Screw) IMPLANT
SCREW CORTEX ST MATTA 3.5X38M (Screw) IMPLANT
SCREW CORTEX ST MATTA 3.5X50MM (Screw) IMPLANT
SCREW CORTICAL 3.5X44MM (Screw) ×2 IMPLANT
SET HNDPC FAN SPRY TIP SCT (DISPOSABLE) ×1 IMPLANT
SPONGE T-LAP 18X18 ~~LOC~~+RFID (SPONGE) IMPLANT
STAPLER VISISTAT 35W (STAPLE) ×1 IMPLANT
SUCTION TUBE FRAZIER 10FR DISP (SUCTIONS) ×1 IMPLANT
SUT ETHILON 2 0 PSLX (SUTURE) ×2 IMPLANT
SUT FIBERWIRE #2 38 T-5 BLUE (SUTURE) ×2
SUT MNCRL AB 3-0 PS2 18 (SUTURE) ×1 IMPLANT
SUT MON AB 2-0 CT1 36 (SUTURE) ×1 IMPLANT
SUT VIC AB 0 CT1 27 (SUTURE) ×1
SUT VIC AB 0 CT1 27XBRD ANBCTR (SUTURE) ×1 IMPLANT
SUT VIC AB 1 CT1 18XCR BRD 8 (SUTURE) ×1 IMPLANT
SUT VIC AB 1 CT1 27 (SUTURE) ×1
SUT VIC AB 1 CT1 27XBRD ANBCTR (SUTURE) ×1 IMPLANT
SUT VIC AB 1 CT1 8-18 (SUTURE) ×1
SUT VIC AB 2-0 CT1 27 (SUTURE) ×1
SUT VIC AB 2-0 CT1 TAPERPNT 27 (SUTURE) ×1 IMPLANT
SUTURE FIBERWR #2 38 T-5 BLUE (SUTURE) ×2 IMPLANT
TOWEL GREEN STERILE (TOWEL DISPOSABLE) ×2 IMPLANT
TOWEL GREEN STERILE FF (TOWEL DISPOSABLE) ×2 IMPLANT
TRAY FOLEY MTR SLVR 16FR STAT (SET/KITS/TRAYS/PACK) IMPLANT
WATER STERILE IRR 1000ML POUR (IV SOLUTION) IMPLANT

## 2023-03-16 NOTE — Transfer of Care (Signed)
Immediate Anesthesia Transfer of Care Note  Patient: Kristina [name]  Procedure(s) Performed: OPEN REDUCTION INTERNAL FIXATION (ORIF) ACETABULAR FRACTURE STOPPA APPROACH (Left: Pelvis)  Patient Location: PACU  Anesthesia Type:MAC combined with regional for post-op pain  Level of Consciousness: drowsy and patient cooperative  Airway & Oxygen Therapy: Patient Spontanous Breathing and Patient connected to face mask oxygen  Post-op Assessment: Report given to RN and Post -op Vital signs reviewed and stable  Post vital signs: Reviewed and stable  Last Vitals:  Vitals Value Taken Time  BP 146/81 03/16/23 1207  Temp    Pulse 94 03/16/23 1210  Resp 13 03/16/23 1210  SpO2 94 % 03/16/23 1210  Vitals shown include unvalidated device data.  Last Pain:  Vitals:   03/16/23 0718  TempSrc: Oral  PainSc:       Patients Stated Pain Goal: 0 (03/15/23 2057)  Complications: No notable events documented.

## 2023-03-16 NOTE — Anesthesia Preprocedure Evaluation (Signed)
Anesthesia Evaluation  Patient identified by MRN, date of birth, ID band Patient awake    Reviewed: Allergy & Precautions, NPO status , Patient's Chart, lab work & pertinent test results  Airway Mallampati: II  TM Distance: >3 FB Neck ROM: Full    Dental  (+) Dental Advisory Given   Pulmonary shortness of breath (Post covid O2 requirement and SOB), with exertion, at rest and Long-Term Oxygen Therapy, sleep apnea    breath sounds clear to auscultation       Cardiovascular hypertension, Pt. on medications and Pt. on home beta blockers +CHF   Rhythm:Regular Rate:Normal     Neuro/Psych negative neurological ROS     GI/Hepatic Neg liver ROS,GERD  ,,  Endo/Other  diabetes, Type 2    Renal/GU negative Renal ROS     Musculoskeletal   Abdominal   Peds  Hematology negative hematology ROS (+)   Anesthesia Other Findings   Reproductive/Obstetrics                             Anesthesia Physical Anesthesia Plan  ASA: 4  Anesthesia Plan: Spinal   Post-op Pain Management: Tylenol PO (pre-op)*   Induction:   PONV Risk Score and Plan: 2 and Propofol infusion, Dexamethasone and Ondansetron  Airway Management Planned: Natural Airway and Simple Face Mask  Additional Equipment:   Intra-op Plan:   Post-operative Plan:   Informed Consent: I have reviewed the patients History and Physical, chart, labs and discussed the procedure including the risks, benefits and alternatives for the proposed anesthesia with the patient or authorized representative who has indicated his/her understanding and acceptance.       Plan Discussed with: CRNA and Surgeon  Anesthesia Plan Comments:        Anesthesia Quick Evaluation

## 2023-03-16 NOTE — Interval H&P Note (Signed)
History and Physical Interval Note:  03/16/2023 9:22 AM  Kristina [name]  has presented today for surgery, with the diagnosis of left acetabular fracture.  The various methods of treatment have been discussed with the patient and family. After consideration of risks, benefits and other options for treatment, the patient has consented to  Procedure(s): OPEN REDUCTION INTERNAL FIXATION (ORIF) ACETABULAR FRACTURE STOPPA APPROACH (Left) as a surgical intervention.  The patient's history has been reviewed, patient examined, no change in status, stable for surgery.  I have reviewed the patient's chart and labs.  Questions were answered to the patient's satisfaction.     Caryn Bee P Paxtyn Boyar

## 2023-03-16 NOTE — Op Note (Signed)
Orthopaedic Surgery Operative Note (CSN: [id number] ) Date of Surgery: 03/16/2023  Admit Date: 03/14/2023   Diagnoses: Pre-Op Diagnoses: Left anterior column/posterior hemi-transverse acetabular fracture  Post-Op Diagnosis: Same  Procedures: CPT 27228-Open reduction internal fixation of left acetabular fracture  Surgeons : Primary: Roby Lofts, MD  Assistant: Ulyses Southward, PA-C  Location: OR 3   Anesthesia: General   Antibiotics: Ancef 2g preop with 1 gm vancomycin powder placed topically   Tourniquet time: None    Estimated Blood Loss: 500 mL  Complications: None   Specimens:None  Implants: Implant Name Type Inv. Item Serial No. Manufacturer Lot No. LRB No. Used Action  PLATE SUPRAPECTINEAL L - [id number] Plate PLATE SUPRAPECTINEAL L  [employer] [id number] Left 1 Implanted  SCREW CORTEX ST MATTA 3.5X50MM - [id number] Screw SCREW CORTEX ST MATTA 3.5X50MM  [employer]  Left 1 Implanted  SCREW CORTICAL 3.5X44MM - [id number] Screw SCREW CORTICAL 3.5X44MM  [employer]  Left 2 Implanted  SCREW CORTEX ST MATTA 3.5X36MM - [id number] Screw SCREW CORTEX ST MATTA 3.5X36MM  [employer]  Left 2 Implanted  SCREW CORTEX ST MATTA 3.5X38M - [id number] Screw SCREW CORTEX ST MATTA 3.5X38M  [employer]  Left 1 Implanted  SCREW CORTEX ST MATTA 3.5X32MM - [id number] Screw SCREW CORTEX ST MATTA 3.5X32MM  [employer]  Left 1 Implanted  SCREW CORTEX ST MATTA 3.5X22MM - [id number] Screw SCREW CORTEX ST MATTA 3.5X22MM  [employer]  Left 2 Implanted     Indications for Surgery: 72 year old female who sustained a ground-level fall with a left anterior column/posterior hemitransverse acetabular fracture.  Due to the unstable nature of her injury I recommend proceeding with open reduction internal fixation.  Risk and benefits were discussed with the patient and her daughter.  Risks included but not limited to bleeding, infection, malunion,  nonunion, hardware failure, hardware irritation, nerve or blood vessel injury, DVT, even the possibility anesthetic complications.  The patient agreed to proceed with surgery and consent was obtained.  Operative Findings: Open reduction internal fixation of left acetabular fracture through intrapelvic window using St[employer]ker suprapectoral nail plate  Procedure: The patient was identified in the preoperative holding area. Consent was confirmed with the patient and their family and all questions were answered. The operative extremity was marked after confirmation with the patient. she was then brought back to the operating room by our anesthesia colleagues.  She was placed under spinal anesthetic and carefully transferred over to radiolucent flattop table.  Fluoroscopic imaging showed the left acetabular fracture. The pelvis was then prepped and draped in usual sterile fashion.  A timeout was performed to verify the patient, the procedure, and the extremity.  Reoperative antibiotics were dosed.  A standard Pfannenstiel incision was then made and carried down through skin and subcutaneous tissue.  I then identified the center of the rectus and split this vertical.  I then bluntly released the adhesions of the bladder and bladder fat to the underside of the rectus.  I then was able to release the rectus off of the anterior portion of the superior pubic ramus.  I then carried my dissection along the superior brim of the pelvis until I reached the corona mortise.  I then used a vessel clip to clip the corona mortise and incised through that controlling the bleeding on the way.  I then incised through the iliopectineal fascia and proceeded to continue my dissection along the brim of the pelvis to the SI joint.  I took care to identify and protect the  obturator nerve throughout the dissection and the procedure.  Once I had exposure of the fracture I then cleaned out the hematoma and cleaned out the anterior column  fracture.  The majority of this placement was from the anterior column and I felt that using the plate as a reduction tool would be most appropriate.  I confirmed that I was able to reduce the anterior column using fluoroscopy.  I then positioned a Stryker suprapectoral nail plate within the pelvis and held it provisionally with K wires and confirmed positioning with fluoroscopy.  I then placed nonlocking screws into the superior pubic ramus.  I then used a ball spike pusher to the lateralized the plate as well as to reduce the anterior column.  I then drilled and placed nonlocking screws into the intact ilium to bring the plate down even further and reduce the anterior column.  Once I confirmed adequate reduction of the acetabulum I then placed a total of 4 screws into the ilium and total of 4 screws into the superior pubic ramus.  Due to the minimal displacement of the posterior column I did not elect to put any screws into the posterior column.  Final fluoroscopic imaging was then obtained.  The incision was then copiously irrigated.  A gram of vancomycin powder was placed into the incision.  Layered closure of 0 Vicryl for the rectus and 2-0 Vicryl and 3-0 Monocryl for the skin.  Dermabond was used to seal the skin.  A sterile dressing was applied.  The patient was then awoke from anesthesia and taken to the PACU in stable condition.  Post Op Plan/Instructions: The patient will be touchdown weightbearing to the left lower extremity.  She will receive postoperative Ancef.  She will receive Lovenox for DVT prophylaxis and discharged on an oral DOAC.  Will have her mobilize with physical and Occupational Therapy.  I was present and performed the entire surgery.  Ulyses Southward, PA-C did assist me throughout the case. An assistant was necessary given the difficulty in approach, maintenance of reduction and ability to instrument the fracture.   Truitt Merle, MD [facility]

## 2023-03-16 NOTE — Progress Notes (Signed)
Patient arrived in pre-op with oxygen saturation 77% on room air. Patient stated she wears oxygen at home prn but has not needed it awhile. Placed patient on 5L [place] and called Dr. Sampson Goon to come assess. Patient rebounded to 92% on 5L. MD at bedside at this time. No new orders at this time.

## 2023-03-16 NOTE — Progress Notes (Signed)
Desaturation to 80's on 4L [place] while talking during assessment.  Spoke with patients daughter Kristina [name]) UJ:[id number] status, monitoring procedures for the patient, plan of care for this shift, and best time to call for updates. Also Suggested that she call other family members to inform them of patients need for rest at this time, and to try calling her tomorrow afternoon as opposed to this evening. Spoke with another family member immediately following; called daughter back, and suggested that she be contact person for hospital staff and all other family members should contact her Kristina [name]) directly for updates.  Encouraged incentive spirometer use, and educated on the importance of continued use during this phase of her recovery. Bed low and locked, call bell and bedside table within reach.

## 2023-03-16 NOTE — Anesthesia Procedure Notes (Signed)
Spinal  Patient location during procedure: OR Start time: 03/16/2023 10:03 AM End time: 03/16/2023 10:08 AM Reason for block: surgical anesthesia Staffing Performed: anesthesiologist  Anesthesiologist: Marcene Duos, MD Performed by: Marcene Duos, MD Authorized by: Marcene Duos, MD   Preanesthetic Checklist Completed: patient identified, IV checked, site marked, risks and benefits discussed, surgical consent, monitors and equipment checked, pre-op evaluation and timeout performed Spinal Block Patient position: right lateral decubitus Prep: DuraPrep Patient monitoring: heart rate, cardiac monitor, continuous pulse ox and blood pressure Approach: midline Location: L3-4 Injection technique: single-shot Needle Needle type: Pencan  Needle gauge: 24 G Needle length: 9 cm Assessment Sensory level: T4 Events: CSF return

## 2023-03-16 NOTE — Progress Notes (Signed)
PROGRESS NOTE    ROSENE PILLING  UEA:[id number] DOB: Aug 10, 1951 DOA: 03/14/2023 PCP: Benita Stabile, MD   Brief Narrative:  ANJELIQUE MAKAR is a 72 y.o. female with medical history significant of CHF DM2 hx of ARDS after COVID    Presented with   fall and closed nondisplaced fracture of the acetabulum with orthopedics for ORIF today.   Assessment & Plan:   Principal Problem:   Acetabular fracture (HCC) Active Problems:   Chronic heart failure with preserved ejection fraction (HFpEF) (HCC)   Chronic respiratory failure with hypoxia (HCC)   Sleep apnea  Assessment and Plan:  Chronic heart failure with preserved ejection fraction (HFpEF) (HCC) Well compensated euvolemic currently HOld Lasix for now Restart Coreg 6.25mg  po BID Hydralazine as needed for severe blood pressure elevations   Acetabular fracture (HCC)  - management as per orthopedics,  plan to operate  today  Keep nothing by mouth post midnight. 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  - Hold by mouth medications      Sleep apnea Use CPAP at night   Chronic respiratory failure with hypoxia (HCC) Chronic post-COVID continue use of oxygen as needed Patient states sometimes her oxygen goes down into high 80s and she just takes a few deep breaths and she does better but she does have an oxygen tank at home   Obesity BMI  35.29    DVT prophylaxis:SCDs Code Status: Full Family Communication: Daughter at bedside 6/21 Disposition Plan:  Status is: Inpatient Remains inpatient appropriate because: Need for IV medications and inpatient procedure.   Consultants:  Orthopedics  Procedures:  None  Antimicrobials:  None   Subjective: Patient seen and evaluated today with no new acute complaints or concerns. No acute concerns or events noted overnight.  She has severe hip pain with movement, otherwise doing well.  Objective: Vitals:   03/16/23 0718 03/16/23 0844 03/16/23 0900 03/16/23 0919  BP: (!) 179/83   (!) 152/81  Pulse: 99 (!) 108    Resp: 18 (!) 36  (!) 32  Temp: 98.7 F (37.1 C) 98 F (36.7 C)    TempSrc: Oral     SpO2: 93% (!) 77% (!) 84% 92%  Weight:      Height:        Intake/Output Summary (Last 24 hours) at 03/16/2023 1025 Last data filed at 03/16/2023 0300 Gross per 24 hour  Intake 237 ml  Output 750 ml  Net -513 ml   Filed Weights   03/14/23 2357  Weight: 76.6 kg    Examination:  General exam: Appears calm and comfortable  Respiratory system: Clear to auscultation. Respiratory effort normal.  Nasal cannula Cardiovascular system: S1 & S2 heard, RRR.  Gastrointestinal system: Abdomen is soft Central nervous system: Alert and awake  Extremities: No edema Skin: No significant lesions noted Psychiatry: Flat affect.    Data Reviewed: I have personally reviewed following labs and imaging studies  CBC: Recent Labs  Lab 03/14/23 1340 03/16/23 0651  WBC 8.9 13.2*  NEUTROABS 6.6  --   HGB 15.2* 13.8  HCT 49.2* 46.7*  MCV 92.3 96.1  PLT 221 189   Basic Metabolic Panel: Recent Labs  Lab 03/14/23 1340 03/14/23 2054 03/16/23 0651  NA 137  --  139  K 3.5  --  3.7  CL 95*  --  95*  CO2 32  --  33*  GLUCOSE 165*  --  154*  BUN 13  --  17  CREATININE 0.66  --  0.63  CALCIUM 8.8*  --  8.9  MG  --  1.9 2.3  PHOS  --  3.2  --    GFR: Estimated Creatinine  Clearance: 56.2 mL/min (by C-G formula based on SCr of 0.63 mg/dL). Liver Function Tests: Recent Labs  Lab 03/14/23 2054  AST 30  ALT 17  ALKPHOS 69  BILITOT 0.7  PROT 6.6  ALBUMIN 3.6   No results for input(s): "LIPASE", "AMYLASE" in the last 168 hours. No results for input(s): "AMMONIA" in the last 168 hours. Coagulation Profile: Recent Labs  Lab 03/14/23 2054  INR 0.9   Cardiac Enzymes: Recent Labs  Lab 03/14/23 2054  CKTOTAL 95   BNP (last 3 results) No results for input(s): "PROBNP" in the last 8760 hours. HbA1C: Recent Labs    03/14/23 2054  HGBA1C 5.7*   CBG: Recent Labs  Lab 03/15/23 1630 03/15/23 2115 03/16/23 0028 03/16/23 0451 03/16/23 0715  GLUCAP 137* 152* 153* 167* 157*   Lipid Profile: No results for input(s): "CHOL", "HDL", "LDLCALC", "TRIG", "CHOLHDL", "LDLDIRECT" in the last 72 hours. Thyroid Function Tests: Recent Labs    03/14/23 2054  TSH 0.560   Anemia Panel: No results for input(s): "VITAMINB12", "FOLATE", "FERRITIN", "TIBC", "IRON", "RETICCTPCT" in the last 72 hours. Sepsis Labs: No results for input(s): "PROCALCITON", "LATICACIDVEN" in the last 168 hours.  Recent Results (from the past 240 hour(s))  Surgical pcr screen     Status: None   Collection Time: 03/15/23  3:36 PM   Specimen: Nasal Mucosa; Nasal Swab  Result Value Ref Range Status   MRSA, PCR NEGATIVE NEGATIVE Final   Staphylococcus aureus NEGATIVE NEGATIVE Final    Comment: (NOTE) The Xpert SA Assay (FDA approved for NASAL specimens in patients 60 years of age and older), is one component of a comprehensive surveillance program. It is not intended to diagnose infection nor to guide or monitor treatment. Performed at [facility], [address] [place], [place] [address]          Radiology Studies: ECHOCARDIOGRAM COMPLETE  Result Date: 03/15/2023    ECHOCARDIOGRAM REPORT   Patient Name:   YANELY MAST Date of Exam: 03/15/2023 Medical Rec #:   [id number]       Height:       58.0 in Accession #:    [id number]      Weight:       168.9 lb Date of Birth:  1951-07-07       BSA:          1.695 m Patient Age:    72 years        BP:           160/89 mmHg Patient Gender: F  HR:           89 bpm. Exam Location:  Inpatient Procedure: 2D Echo, Cardiac Doppler and Color Doppler Indications:    Preoperative evaluation  History:        Patient has prior history of Echocardiogram examinations, most                 recent 04/06/2021. CHF; Risk Factors:Hypertension, Diabetes and                 Sleep Apnea.  Sonographer:    Lucendia Herrlich Referring Phys: [id number] ANASTASSIA DOUTOVA IMPRESSIONS  1. Left ventricular ejection fraction, by estimation, is 60 to 65%. The left ventricle has normal function. The left ventricle has no regional wall motion abnormalities. There is mild left ventricular hypertrophy. Left ventricular diastolic parameters are consistent with Grade I diastolic dysfunction (impaired relaxation).  2. Right ventricular systolic function is normal. The right ventricular size is normal. Tricuspid regurgitation signal is inadequate for assessing PA pressure.  3. The mitral valve is grossly normal. No evidence of mitral valve regurgitation.  4. There is mild calcification of the aortic valve. Aortic valve regurgitation is not visualized.  5. The inferior vena cava is normal in size with greater than 50% respiratory variability, suggesting right atrial pressure of 3 mmHg. FINDINGS  Left Ventricle: Left ventricular ejection fraction, by estimation, is 60 to 65%. The left ventricle has normal function. The left ventricle has no regional wall motion abnormalities. The left ventricular internal cavity size was normal in size. There is  mild left ventricular hypertrophy. Left ventricular diastolic parameters are consistent with Grade I diastolic dysfunction (impaired relaxation). Right Ventricle: The right ventricular size is normal. Right ventricular  systolic function is normal. Tricuspid regurgitation signal is inadequate for assessing PA pressure. Left Atrium: Left atrial size was normal in size. Right Atrium: Right atrial size was normal in size. Pericardium: Trivial pericardial effusion is present. Mitral Valve: The mitral valve is grossly normal. No evidence of mitral valve regurgitation. Tricuspid Valve: Tricuspid valve regurgitation is not demonstrated. Aortic Valve: There is mild calcification of the aortic valve. Aortic valve regurgitation is not visualized. Aortic valve peak gradient measures 6.7 mmHg. Pulmonic Valve: Pulmonic valve regurgitation is not visualized. Aorta: The aortic root and ascending aorta are structurally normal, with no evidence of dilitation. Venous: The inferior vena cava is normal in size with greater than 50% respiratory variability, suggesting right atrial pressure of 3 mmHg. IAS/Shunts: The interatrial septum was not well visualized.  LEFT VENTRICLE PLAX 2D LVIDd:         4.50 cm   Diastology LVIDs:         3.20 cm   LV e' medial:    0.08 cm/s LV PW:         0.60 cm   LV E/e' medial:  6.9 LV IVS:        1.20 cm   LV e' lateral:   0.06 cm/s LVOT diam:     2.00 cm   LV E/e' lateral: 8.8 LV SV:         51 LV SV Index:   30 LVOT Area:     3.14 cm  RIGHT VENTRICLE             IVC RV S prime:     12.00 cm/s  IVC diam: 1.70 cm TAPSE (M-mode): 1.7 cm LEFT ATRIUM             Index  RIGHT ATRIUM           Index LA diam:        3.70 cm 2.18 cm/m   RA Area:     10.20 cm LA Vol (A2C):   57.2 ml 33.75 ml/m  RA Volume:   20.50 ml  12.10 ml/m LA Vol (A4C):   54.8 ml 32.33 ml/m LA Biplane Vol: 58.2 ml 34.34 ml/m  AORTIC VALVE AV Area (Vmax): 2.26 cm AV Vmax:        129.00 cm/s AV Peak Grad:   6.7 mmHg LVOT Vmax:      92.90 cm/s LVOT Vmean:     59.300 cm/s LVOT VTI:       0.162 m  AORTA Ao Root diam: 2.90 cm Ao Asc diam:  3.30 cm MITRAL VALVE MV Area (PHT): 3.72 cm    SHUNTS MV Decel Time: 204 msec    Systemic VTI:  0.16 m MR  Peak grad: 42.8 mmHg    Systemic Diam: 2.00 cm MR Vmax:      327.00 cm/s MV E velocity: 0.55 cm/s MV A velocity: 99.00 cm/s MV E/A ratio:  0.01 Carolan Clines Electronically signed by Carolan Clines Signature Date/Time: 03/15/2023/10:46:43 AM    Final    DG Chest Port 1 View  Result Date: 03/14/2023 CLINICAL DATA:  Preop evaluation EXAM: PORTABLE CHEST 1 VIEW COMPARISON:  04/09/2021 FINDINGS: Transverse diameter of heart is increased. Low lung volumes are noted. Increased interstitial markings are seen in left lower lung field with no interval change. Left hemidiaphragm is elevated. There are no new infiltrates or signs of pulmonary edema. There is no pleural effusion or pneumothorax. IMPRESSION: Linear densities in left lower lung field may suggest scarring or subsegmental atelectasis. Low lung volumes. Left hemidiaphragm is elevated. There are no new infiltrates or signs of pulmonary edema. Electronically Signed   By: Ernie Avena M.D.   On: 03/14/2023 19:28   CT PELVIS WO CONTRAST  Result Date: 03/14/2023 CLINICAL DATA:  Hip fracture EXAM: CT PELVIS WITHOUT CONTRAST TECHNIQUE: Multidetector CT imaging of the pelvis was performed following the standard protocol without intravenous contrast. RADIATION DOSE REDUCTION: This exam was performed according to the departmental dose-optimization program which includes automated exposure control, adjustment of the mA and/or kV according to patient size and/or use of iterative reconstruction technique. COMPARISON:  X-ray 03/14/2023 and older FINDINGS: Urinary Tract: Preserved contours of the urinary bladder. Bladder is mildly distended. Presumed cysts involving the kidneys of the edge of the imaging field but incompletely evaluated. Please correlate with prior imaging of the area or if needed ultrasound. Bowel: No oral contrast. Stool in the rectum and sigmoid colon. Significant diverticula of the sigmoid colon. Normal appendix in the right lower quadrant. The  visualized small bowel is nondilated. Vascular/Lymphatic: Mild vascular calcifications along the aorta and iliac vessels. No specific abnormal lymph node enlargement identified in the visualized pelvis. Reproductive: Uterus is present. There is a lobular structure in the anterior left adnexa which appears separate from the left ovary. Paraovarian lesion or exophytic pedunculated fibroid is possible. Please correlate for any known history or dedicated workup when appropriate. The structure on series 3, image 24 measures 4.1 by 3.2 cm. Other: No free fluid in the pelvis. There is some hematoma along the extraperitoneal space on the left side in the low pelvis. Musculoskeletal: Acute appearing fracture involving the left inferior pubic ramus which is comminuted. There also fracture lines along the anterior aspect of the left acetabulum. Comminuted fracture  fragments extend along the medial portion of the acetabulum, posterior and superior. Fracture lines extend along the left side of the iliac bone inferiorly. Please correlate with the time course of injury. Mild degenerative changes of the visualized portions of the spine and sacroiliac joints. Sclerosis along the pubic symphysis. Focal asymmetric thickening of the left iliacus muscle and obturator internus. Possible associated hematoma. IMPRESSION: Comminuted fracture identified involving the left acetabulum with fracture lines diffusely along all aspects of the acetabulum. Extension of fracture lines along the inferior left iliac bone. Separate inferior pubic ramus fracture on the left. Surrounding associated hematoma in the extraperitoneal space and involving the musculature of the iliacus muscle and obturator internus. 4.1 cm soft tissue nodule in the left adnexa anterior to the left side of the uterus and ovary. Possibilities would include a paraovarian lesion versus an exophytic uterine fibroid. Please correlate for any known history or dedicated workup when  appropriate Electronically Signed   By: Karen Kays M.D.   On: 03/14/2023 16:07   DG Hip Unilat With Pelvis 2-3 Views Left  Result Date: 03/14/2023 CLINICAL DATA:  Trauma, fall EXAM: DG HIP (WITH OR WITHOUT PELVIS) 2-3V LEFT COMPARISON:  07/18/2022 FINDINGS: There is comminuted displaced fracture in left iliac bone along the medial aspect of left acetabulum. There is 8 mm offset in alignment of fracture fragments in the medial aspect of left iliac bone. There is radiolucent line in the lateral cortex of left iliac bone superior to the acetabulum. There is fracture in left inferior pubic ramus with 5 mm offset in alignment. As far as seen, no fractures are noted in proximal left femur. IMPRESSION: There is no demonstrable fracture in proximal left femur.There is recent comminuted displaced fracture in left iliac bone involving the medial aspect of left acetabulum. Fracture line is involving the lateral cortical margin of left iliac bone superior to the acetabulum. There is recent displaced fracture in the left inferior pubic ramus. Follow-up CT may be considered. Electronically Signed   By: Ernie Avena M.D.   On: 03/14/2023 14:58        Scheduled Meds:  [MAR Hold] amLODipine  10 mg Oral Daily   [MAR Hold] carvedilol  6.25 mg Oral BID WC   [MAR Hold] feeding supplement  237 mL Oral Q24H   [MAR Hold] insulin aspart  0-9 Units Subcutaneous Q4H   [MAR Hold] losartan  100 mg Oral Daily   [MAR Hold] multivitamin with minerals  1 tablet Oral Daily   [MAR Hold] pantoprazole  40 mg Oral Daily   povidone-iodine  2 Application Topical Once   [MAR Hold] rosuvastatin  10 mg Oral Daily   Continuous Infusions:   ceFAZolin (ANCEF) IV     [MAR Hold] methocarbamol (ROBAXIN) IV     tranexamic acid Stopped (03/16/23 0408)     LOS: 2 days    Time spent: 35 minutes    Syed Zukas D Sherryll Burger, DO [facility]  If 7PM-7AM, please contact night-coverage [email or web address] 03/16/2023, 10:25 AM

## 2023-03-17 DIAGNOSIS — S32402A Unspecified fracture of left acetabulum, initial encounter for closed fracture: Secondary | ICD-10-CM | POA: Diagnosis not present

## 2023-03-17 LAB — GLUCOSE, CAPILLARY
Glucose-Capillary: 115 mg/dL — ABNORMAL HIGH (ref 70–99)
Glucose-Capillary: 116 mg/dL — ABNORMAL HIGH (ref 70–99)
Glucose-Capillary: 123 mg/dL — ABNORMAL HIGH (ref 70–99)
Glucose-Capillary: 127 mg/dL — ABNORMAL HIGH (ref 70–99)
Glucose-Capillary: 150 mg/dL — ABNORMAL HIGH (ref 70–99)
Glucose-Capillary: 154 mg/dL — ABNORMAL HIGH (ref 70–99)

## 2023-03-17 LAB — CBC
HCT: 38 % (ref 36.0–46.0)
Hemoglobin: 11.2 g/dL — ABNORMAL LOW (ref 12.0–15.0)
MCH: 28.4 pg (ref 26.0–34.0)
MCHC: 29.5 g/dL — ABNORMAL LOW (ref 30.0–36.0)
MCV: 96.4 fL (ref 80.0–100.0)
Platelets: 187 10*3/uL (ref 150–400)
RBC: 3.94 MIL/uL (ref 3.87–5.11)
RDW: 14.2 % (ref 11.5–15.5)
WBC: 12.5 10*3/uL — ABNORMAL HIGH (ref 4.0–10.5)
nRBC: 0 % (ref 0.0–0.2)

## 2023-03-17 LAB — BASIC METABOLIC PANEL
Anion gap: 5 (ref 5–15)
BUN: 19 mg/dL (ref 8–23)
CO2: 37 mmol/L — ABNORMAL HIGH (ref 22–32)
Calcium: 8.3 mg/dL — ABNORMAL LOW (ref 8.9–10.3)
Chloride: 97 mmol/L — ABNORMAL LOW (ref 98–111)
Creatinine, Ser: 0.72 mg/dL (ref 0.44–1.00)
GFR, Estimated: >60 mL/min (ref >60)
Glucose, Bld: 134 mg/dL — ABNORMAL HIGH (ref 70–99)
Potassium: 4.5 mmol/L (ref 3.5–5.1)
Sodium: 139 mmol/L (ref 135–145)

## 2023-03-17 LAB — MAGNESIUM: Magnesium: 2.4 mg/dL (ref 1.7–2.4)

## 2023-03-17 MED ORDER — VITAMIN D (ERGOCALCIFEROL) 1.25 MG (50000 UNIT) PO CAPS
50000.0000 [IU] | ORAL_CAPSULE | ORAL | Status: DC
  Administered 2023-03-17: 50000 [IU] via ORAL
  Filled 2023-03-17: qty 1

## 2023-03-17 MED ORDER — CHLORHEXIDINE GLUCONATE CLOTH 2 % EX PADS
6.0000 | MEDICATED_PAD | Freq: Every day | CUTANEOUS | Status: DC
  Administered 2023-03-17 – 2023-03-20 (×4): 6 via TOPICAL

## 2023-03-17 NOTE — Progress Notes (Signed)
   03/16/23 2049  BiPAP/CPAP/SIPAP  Reason BIPAP/CPAP not in use Non-compliant

## 2023-03-17 NOTE — Progress Notes (Signed)
Patient refused Cpap for the night

## 2023-03-17 NOTE — Evaluation (Signed)
Occupational Therapy Evaluation Patient Details Name: Kristina [name] MRN: [id number] DOB: 20-Jan-1951 Today's Date: 03/17/2023   History of Present Illness 72 y.o. female admitted after a fall 6/19 s/p LEFT OPEN REDUCTION INTERNAL FIXATION  ACETABULAR FRACTURE STOPPA APPROACH on 03/16/23 with Dr. Jena Gauss.  Medical history significant of CHF DM2 hx of ARDS after COVID   Clinical Impression   Pt currently at max assist to max +2 for supine to sit and simulated LB selfcare tasks.  Decreased ability to follow TDWBing over the LLE with scooting so did not attempt standing.  Increased fatigue noted prior to session from being up in the chair earlier post PT.  Pt with increased right hip pain and increased dependence with all ADLS per stated.  Feel she will benefit from acute care OT to help regain independence.  Based on current level of assist and spouse being only able to provide limited help.  Feel  patient will benefit from continued inpatient follow up therapy, <3 hours/day.        Recommendations for follow up therapy are one component of a multi-disciplinary discharge planning process, led by the attending physician.  Recommendations may be updated based on patient status, additional functional criteria and insurance authorization.   Assistance Recommended at Discharge Frequent or constant Supervision/Assistance  Patient can return home with the following Two people to help with walking and/or transfers;Two people to help with bathing/dressing/bathroom;Assist for transportation;Assistance with cooking/housework;Help with stairs or ramp for entrance    Functional Status Assessment  Patient has had a recent decline in their functional status and demonstrates the ability to make significant improvements in function in a reasonable and predictable amount of time.  Equipment Recommendations  Other (comment) (TBD next venue of care)       Precautions / Restrictions Precautions Precautions:  Fall Restrictions Weight Bearing Restrictions: Yes LLE Weight Bearing: Touchdown weight bearing      Mobility Bed Mobility Overal bed mobility: Needs Assistance Bed Mobility: Supine to Sit     Supine to sit: Max assist, HOB elevated     General bed mobility comments: Max assist for advancing LEs and for bringing trunk up to sitting.    Transfers Overall transfer level: Needs assistance                 General transfer comment: Max assist for scooting up toward the top of the bed.      Balance Overall balance assessment: Needs assistance Sitting-balance support: Feet supported, Bilateral upper extremity supported Sitting balance-Leahy Scale: Fair                                     ADL either performed or assessed with clinical judgement   ADL Overall ADL's : Needs assistance/impaired Eating/Feeding: Minimal assistance;Sitting Eating/Feeding Details (indicate cue type and reason): Pt with some initial decreased coordination per report Grooming: Wash/dry face;Sitting Grooming Details (indicate cue type and reason): simulated sitting Upper Body Bathing: Sitting;Set up   Lower Body Bathing: Maximal assistance;+2 for safety/equipment Lower Body Bathing Details (indicate cue type and reason): simulated Upper Body Dressing : Set up;Sitting Upper Body Dressing Details (indicate cue type and reason): simulated Lower Body Dressing: Maximal assistance;+2 for safety/equipment;Sit to/from stand Lower Body Dressing Details (indicate cue type and reason): simulated             Functional mobility during ADLs: Maximal assistance (supine to sit and for scooting up  toward the top of the bed) General ADL Comments: Pt fatigued from OOB earlier today.  Max assist for supine to sit EOB with O2 sats at 90-94% on 2Ls nasal cannula.  Unable to maintain TDWBing over the LLE with scooting up EOB.  Did not attempt standing secondary to amount of assist needed to  complete scooting up the EOB and not being safe with one person.     Vision Baseline Vision/History: 1 Wears glasses Ability to See in Adequate Light: 0 Adequate Patient Visual Report: No change from baseline Vision Assessment?: No apparent visual deficits     Perception  Not tested   Praxis  Not tested    Pertinent Vitals/Pain Pain Assessment Pain Assessment: Faces Faces Pain Scale: Hurts even more Pain Location: Lt hip Pain Descriptors / Indicators: Aching Pain Intervention(s): Monitored during session, Limited activity within patient's tolerance     Hand Dominance Left   Extremity/Trunk Assessment Upper Extremity Assessment Upper Extremity Assessment: Overall WFL for tasks assessed   Lower Extremity Assessment Lower Extremity Assessment: Defer to PT evaluation       Communication Communication Communication: No difficulties   Cognition Arousal/Alertness: Awake/alert Behavior During Therapy: WFL for tasks assessed/performed Overall Cognitive Status: Within Functional Limits for tasks assessed                                                  Home Living Family/patient expects to be discharged to:: Private residence Living Arrangements: Spouse/significant other Available Help at Discharge: Family;Available 24 hours/day (Husband cannot help physically) Type of Home: Mobile home Home Access: Stairs to enter Entrance Stairs-Number of Steps: 3 Entrance Stairs-Rails: Right;Left;Can reach both Home Layout: One level     Bathroom Shower/Tub: Producer, television/film/video: Standard     Home Equipment: Agricultural consultant (2 wheels);Cane - single point;BSC/3in1;Wheelchair - manual;Shower seat          Prior Functioning/Environment Prior Level of Function : Independent/Modified Independent;Driving;History of Falls (last six months)             Mobility Comments: used RW to mobilize ADLs Comments: mod I, drives        OT Problem  List: Decreased strength;Decreased knowledge of use of DME or AE;Decreased activity tolerance;Cardiopulmonary status limiting activity;Pain;Impaired balance (sitting and/or standing)      OT Treatment/Interventions: Self-care/ADL training;Patient/family education;Balance training;Therapeutic activities;DME and/or AE instruction    OT Goals(Current goals can be found in the care plan section) Acute Rehab OT Goals Patient Stated Goal: Pt wants to get stronger and have her pain level improve OT Goal Formulation: With patient/family Time For Goal Achievement: 03/31/23 Potential to Achieve Goals: Good  OT Frequency: Min 2X/week       AM-PAC OT "6 Clicks" Daily Activity     Outcome Measure Help from another person eating meals?: A Little Help from another person taking care of personal grooming?: A Little Help from another person toileting, which includes using toliet, bedpan, or urinal?: Total Help from another person bathing (including washing, rinsing, drying)?: Total Help from another person to put on and taking off regular upper body clothing?: A Little Help from another person to put on and taking off regular lower body clothing?: Total 6 Click Score: 12   End of Session Nurse Communication: Mobility status  Activity Tolerance: Patient limited by fatigue Patient left: in bed;with call  bell/phone within reach;with family/visitor present  OT Visit Diagnosis: Unsteadiness on feet (R26.81);Muscle weakness (generalized) (M62.81);Pain Pain - Right/Left: Left Pain - part of body: Leg                Time: 1343-1413 OT Time Calculation (min): 30 min Charges:  OT General Charges $OT Visit: 1 Visit OT Evaluation $OT Eval Moderate Complexity: 1 Mod OT Treatments $Self Care/Home Management : 8-22 mins Perrin Maltese, OTR/L Acute Rehabilitation Services  Office [phone] 03/17/2023

## 2023-03-17 NOTE — Progress Notes (Signed)
Patient c/o discomfort with abdominal pressure; 2 person assist with sitting up on edge of bed to dangle feet for a few minutes. Very difficult 2 person max assist to edge of bed.

## 2023-03-17 NOTE — Progress Notes (Signed)
Patient titrated down to 3L [place], maintains saturations 92-96% while awake; however will desat 70s-80s while sleeping.  Patient reports using CPAP machine at home, stating she uses a full mask because nasal prongs do not work for her since she sleeps with her mouth open. Patient agreeable to wearing hospital device. States night prior to surgery she said she'd try to go without it, however one had not been offered to her since surgery. Notified RT ~0320 of patients need for device.  Response: "Yes cpap use at home but is noncompliant with our hospital machine. Her nasal cannula should suffice, no COPD history so titrate as needed"

## 2023-03-17 NOTE — Progress Notes (Addendum)
PROGRESS NOTE    Kristina [name]  AVW:[id number] DOB: December 03, 1950 DOA: 03/14/2023 PCP: Benita Stabile, MD   Brief Narrative:  Kristina [name] is a 72 y.o. 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  PT/OT evaluation pending.   Assessment & Plan:   Principal Problem:   Acetabular fracture (HCC) Active Problems:   Chronic heart failure with preserved ejection fraction (HFpEF) (HCC)   Chronic respiratory failure with hypoxia (HCC)   Sleep apnea  Assessment and Plan:  Acetabular fracture (HCC) status post ORIF 6/21  - Further management per orthopedics -PT/OT evaluation pending  Chronic heart failure with preserved ejection fraction (HFpEF) (HCC) Well compensated euvolemic currently HOld Lasix for now Restart Coreg 6.25mg  po BID Hydralazine as needed for severe blood pressure elevations    Uncontrolled diabetes mellitus (HCC)  - Order Sensitive SSI    -TSH 0.56 and A1c 5.7%.  - Hold by mouth medications      Sleep apnea Use CPAP at night   Chronic respiratory failure with hypoxia (HCC) Chronic post-COVID continue use of oxygen as needed Patient states sometimes her oxygen goes down into high 80s and she just takes a few deep breaths and she does better but she does have an oxygen tank at home  Vitamin D deficiency -Started on replacement protocol 6/22   Obesity BMI 35.29    DVT prophylaxis:Lovenox Code Status: Full Family Communication: Daughter at bedside 6/21 Disposition Plan:  Status is: Inpatient Remains inpatient appropriate because: Need for IV medications and inpatient procedure.   Consultants:  Orthopedics  Procedures:  ORIF to left acetabulum 6/21  Antimicrobials:  None   Subjective: Patient seen and evaluated today with no new acute complaints or concerns. No acute concerns or events noted overnight.  She has  slept well and is anticipating further work with physical therapy today.  Objective: Vitals:   03/16/23 2106 03/17/23 0000 03/17/23 0226 03/17/23 0700  BP: 109/63 108/66 118/68 116/62  Pulse: 93 80 90   Resp: 17 11 15    Temp: 98.6 F (37 C) 97.7 F (36.5 C) 98.3 F (36.8 C) 98.2 F (36.8 C)  TempSrc: Oral  Oral Oral  SpO2: 93% 93% 93%   Weight:      Height:        Intake/Output Summary (Last 24 hours) at 03/17/2023 1000 Last data filed at 03/17/2023 0345 Gross per 24 hour  Intake 2627 ml  Output 1300 ml  Net 1327 ml   Filed Weights   03/14/23 2357  Weight: 76.6 kg    Examination:  General exam: Appears calm and comfortable  Respiratory system: Clear to auscultation. Respiratory effort normal.  Nasal cannula Cardiovascular system: S1 & S2 heard, RRR.  Gastrointestinal system: Abdomen is soft Central nervous system: Alert and awake Extremities: No edema, incision C/D/I Skin: No significant lesions noted Psychiatry: Flat affect.    Data Reviewed: I have personally reviewed following labs and imaging studies  CBC: Recent Labs  Lab 03/14/23 1340 03/16/23 0651 03/16/23 1632 03/17/23 0351  WBC 8.9 13.2* 14.0* 12.5*  NEUTROABS 6.6  --   --   --   HGB 15.2* 13.8 12.7 11.2*  HCT 49.2* 46.7* 42.1 38.0  MCV 92.3 96.1 95.9 96.4  PLT 221 189 179 187   Basic Metabolic Panel: Recent Labs  Lab 03/14/23 1340 03/14/23 2054 03/16/23 0651 03/16/23  1632 03/17/23 0351  NA 137  --  139  --  139  K 3.5  --  3.7  --  4.5  CL 95*  --  95*  --  97*  CO2 32  --  33*  --  37*  GLUCOSE 165*  --  154*  --  134*  BUN 13  --  17  --  19  CREATININE 0.66  --  0.63 0.52 0.72  CALCIUM 8.8*  --  8.9  --  8.3*  MG  --  1.9 2.3  --  2.4  PHOS  --  3.2  --   --   --    GFR: Estimated Creatinine Clearance: 56.2 mL/min (by C-G formula based on SCr of 0.72 mg/dL). Liver Function Tests: Recent Labs  Lab 03/14/23 2054  AST 30  ALT 17  ALKPHOS 69  BILITOT 0.7  PROT 6.6   ALBUMIN 3.6   No results for input(s): "LIPASE", "AMYLASE" in the last 168 hours. No results for input(s): "AMMONIA" in the last 168 hours. Coagulation Profile: Recent Labs  Lab 03/14/23 2054  INR 0.9   Cardiac Enzymes: Recent Labs  Lab 03/14/23 2054  CKTOTAL 95   BNP (last 3 results) No results for input(s): "PROBNP" in the last 8760 hours. HbA1C: Recent Labs    03/14/23 2054  HGBA1C 5.7*   CBG: Recent Labs  Lab 03/16/23 1751 03/16/23 1957 03/17/23 0037 03/17/23 0332 03/17/23 0750  GLUCAP 149* 153* 127* 123* 115*   Lipid Profile: No results for input(s): "CHOL", "HDL", "LDLCALC", "TRIG", "CHOLHDL", "LDLDIRECT" in the last 72 hours. Thyroid Function Tests: Recent Labs    03/14/23 2054  TSH 0.560   Anemia Panel: No results for input(s): "VITAMINB12", "FOLATE", "FERRITIN", "TIBC", "IRON", "RETICCTPCT" in the last 72 hours. Sepsis Labs: No results for input(s): "PROCALCITON", "LATICACIDVEN" in the last 168 hours.  Recent Results (from the past 240 hour(s))  Surgical pcr screen     Status: None   Collection Time: 03/15/23  3:36 PM   Specimen: Nasal Mucosa; Nasal Swab  Result Value Ref Range Status   MRSA, PCR NEGATIVE NEGATIVE Final   Staphylococcus aureus NEGATIVE NEGATIVE Final    Comment: (NOTE) The Xpert SA Assay (FDA approved for NASAL specimens in patients 74 years of age and older), is one component of a comprehensive surveillance program. It is not intended to diagnose infection nor to guide or monitor treatment. Performed at [facility], [address], [place], [place] [address]          Radiology Studies: DG Pelvis Comp Min 3V  Result Date: 03/16/2023 CLINICAL DATA:  Left acetabular fracture. EXAM: JUDET PELVIS - 3+ VIEW COMPARISON:  Fluoroscopic images of same day. FINDINGS: Status post surgical internal fixation of left acetabular fracture with good alignment of fracture components. Mildly displaced left inferior pubic ramus  fracture is also noted. IMPRESSION: Status post surgical internal fixation of left acetabular fracture. Continued presence of mildly displaced left inferior pubic ramus fracture. Electronically Signed   By: Lupita Raider M.D.   On: 03/16/2023 16:18   DG Pelvis Comp Min 3V  Result Date: 03/16/2023 CLINICAL DATA:  Fluoroscopy assistance for internal fixation of fracture of left hemipelvis EXAM: JUDET PELVIS - 3+ VIEW COMPARISON:  03/14/2023 FINDINGS: Fluoroscopic images show internal fixation of comminuted fractures in left iliac bone. Fluoroscopic time 29.9 seconds. Radiation dose 9.08 mGy. IMPRESSION: Fluoroscopic assistance was provided for internal fixation of comminuted fracture of left iliac  bone. Electronically Signed   By: Ernie Avena M.D.   On: 03/16/2023 14:12   DG C-Arm 1-60 Min-No Report  Result Date: 03/16/2023 Fluoroscopy was utilized by the requesting physician.  No radiographic interpretation.   DG C-Arm 1-60 Min-No Report  Result Date: 03/16/2023 Fluoroscopy was utilized by the requesting physician.  No radiographic interpretation.   ECHOCARDIOGRAM COMPLETE  Result Date: 03/15/2023    ECHOCARDIOGRAM REPORT   Patient Name:   Kristina [name] Date of Exam: 03/15/2023 Medical Rec #:  [id number]       Height:       58.0 in Accession #:    [id number]      Weight:       168.9 lb Date of Birth:  1951/04/19       BSA:          1.695 m Patient Age:    72 years        BP:           160/89 mmHg Patient Gender: F               HR:           89 bpm. Exam Location:  Inpatient Procedure: 2D Echo, Cardiac Doppler and Color Doppler Indications:    Preoperative evaluation  History:        Patient has prior history of Echocardiogram examinations, most                 recent 04/06/2021. CHF; Risk Factors:Hypertension, Diabetes and                 Sleep Apnea.  Sonographer:    Lucendia Herrlich Referring Phys: [id number] ANASTASSIA DOUTOVA IMPRESSIONS  1. Left ventricular ejection fraction, by estimation,  is 60 to 65%. The left ventricle has normal function. The left ventricle has no regional wall motion abnormalities. There is mild left ventricular hypertrophy. Left ventricular diastolic parameters are consistent with Grade I diastolic dysfunction (impaired relaxation).  2. Right ventricular systolic function is normal. The right ventricular size is normal. Tricuspid regurgitation signal is inadequate for assessing PA pressure.  3. The mitral valve is grossly normal. No evidence of mitral valve regurgitation.  4. There is mild calcification of the aortic valve. Aortic valve regurgitation is not visualized.  5. The inferior vena cava is normal in size with greater than 50% respiratory variability, suggesting right atrial pressure of 3 mmHg. FINDINGS  Left Ventricle: Left ventricular ejection fraction, by estimation, is 60 to 65%. The left ventricle has normal function. The left ventricle has no regional wall motion abnormalities. The left ventricular internal cavity size was normal in size. There is  mild left ventricular hypertrophy. Left ventricular diastolic parameters are consistent with Grade I diastolic dysfunction (impaired relaxation). Right Ventricle: The right ventricular size is normal. Right ventricular systolic function is normal. Tricuspid regurgitation signal is inadequate for assessing PA pressure. Left Atrium: Left atrial size was normal in size. Right Atrium: Right atrial size was normal in size. Pericardium: Trivial pericardial effusion is present. Mitral Valve: The mitral valve is grossly normal. No evidence of mitral valve regurgitation. Tricuspid Valve: Tricuspid valve regurgitation is not demonstrated. Aortic Valve: There is mild calcification of the aortic valve. Aortic valve regurgitation is not visualized. Aortic valve peak gradient measures 6.7 mmHg. Pulmonic Valve: Pulmonic valve regurgitation is not visualized. Aorta: The aortic root and ascending aorta are structurally normal, with no  evidence of dilitation. Venous: The inferior vena cava is normal in  size with greater than 50% respiratory variability, suggesting right atrial pressure of 3 mmHg. IAS/Shunts: The interatrial septum was not well visualized.  LEFT VENTRICLE PLAX 2D LVIDd:         4.50 cm   Diastology LVIDs:         3.20 cm   LV e' medial:    0.08 cm/s LV PW:         0.60 cm   LV E/e' medial:  6.9 LV IVS:        1.20 cm   LV e' lateral:   0.06 cm/s LVOT diam:     2.00 cm   LV E/e' lateral: 8.8 LV SV:         51 LV SV Index:   30 LVOT Area:     3.14 cm  RIGHT VENTRICLE             IVC RV S prime:     12.00 cm/s  IVC diam: 1.70 cm TAPSE (M-mode): 1.7 cm LEFT ATRIUM             Index        RIGHT ATRIUM           Index LA diam:        3.70 cm 2.18 cm/m   RA Area:     10.20 cm LA Vol (A2C):   57.2 ml 33.75 ml/m  RA Volume:   20.50 ml  12.10 ml/m LA Vol (A4C):   54.8 ml 32.33 ml/m LA Biplane Vol: 58.2 ml 34.34 ml/m  AORTIC VALVE AV Area (Vmax): 2.26 cm AV Vmax:        129.00 cm/s AV Peak Grad:   6.7 mmHg LVOT Vmax:      92.90 cm/s LVOT Vmean:     59.300 cm/s LVOT VTI:       0.162 m  AORTA Ao Root diam: 2.90 cm Ao Asc diam:  3.30 cm MITRAL VALVE MV Area (PHT): 3.72 cm    SHUNTS MV Decel Time: 204 msec    Systemic VTI:  0.16 m MR Peak grad: 42.8 mmHg    Systemic Diam: 2.00 cm MR Vmax:      327.00 cm/s MV E velocity: 0.55 cm/s MV A velocity: 99.00 cm/s MV E/A ratio:  0.01 Photographer signed by Carolan Clines Signature Date/Time: 03/15/2023/10:46:43 AM    Final         Scheduled Meds:  amLODipine  10 mg Oral Daily   carvedilol  6.25 mg Oral BID WC   docusate sodium  100 mg Oral BID   enoxaparin (LOVENOX) injection  40 mg Subcutaneous Q24H   feeding supplement  237 mL Oral Q24H   insulin aspart  0-9 Units Subcutaneous Q4H   losartan  100 mg Oral Daily   multivitamin with minerals  1 tablet Oral Daily   pantoprazole  40 mg Oral Daily   rosuvastatin  10 mg Oral Daily   Continuous Infusions:  sodium chloride  660 mL/hr at 03/16/23 1619   methocarbamol (ROBAXIN) IV       LOS: 3 days    Time spent: 35 minutes    Kristina Varghese Hoover Brunette, DO [facility]  If 7PM-7AM, please contact night-coverage [email or web address] 03/17/2023, 10:00 AM

## 2023-03-17 NOTE — Evaluation (Signed)
Physical Therapy Evaluation Patient Details Name: Kristina [name] MRN: [id number] DOB: 1951/08/19 Today's Date: 03/17/2023  History of Present Illness  72 y.o. female admitted after a fall 6/19 s/p LEFT OPEN REDUCTION INTERNAL FIXATION  ACETABULAR FRACTURE STOPPA APPROACH on 03/16/23 with Dr. Jena Gauss.  Medical history significant of CHF DM2 hx of ARDS after COVID  Clinical Impression  Patient is s/p above surgery resulting in functional limitations due to the deficits listed below (see PT Problem List). Previously mobilizing at home with RW, independent with ADLs and driving. Currently requires Mod assist +2 for bed mobility and transfers with TDWB on LLE. Unable to ambulate at this time. Minimal to no support at home. Will benefit from post acute rehab to regain independence prior tot returning home. Patient will benefit from continued inpatient follow up therapy, <3 hours/day. Patient will benefit from acute skilled PT to increase their independence and safety with mobility to facilitate discharge.        Recommendations for follow up therapy are one component of a multi-disciplinary discharge planning process, led by the attending physician.  Recommendations may be updated based on patient status, additional functional criteria and insurance authorization.  Follow Up Recommendations Can patient physically be transported by private vehicle: No     Assistance Recommended at Discharge Frequent or constant Supervision/Assistance  Patient can return home with the following  Two people to help with walking and/or transfers;A lot of help with bathing/dressing/bathroom;Assistance with cooking/housework;Assist for transportation;Help with stairs or ramp for entrance    Equipment Recommendations None recommended by PT (TBD next venue)     Functional Status Assessment Patient has had a recent decline in their functional status and demonstrates the ability to make significant improvements in function in  a reasonable and predictable amount of time.     Precautions / Restrictions Precautions Precautions: Fall Restrictions Weight Bearing Restrictions: Yes LLE Weight Bearing: Touchdown weight bearing      Mobility  Bed Mobility Overal bed mobility: Needs Assistance Bed Mobility: Supine to Sit     Supine to sit: Mod assist, HOB elevated, +2 for physical assistance     General bed mobility comments: Mod assist for LLE support out of bed and trunk support to rise. Cues for technique. Able to assist using RLE and UEs with rail.    Transfers Overall transfer level: Needs assistance Equipment used: Rolling walker (2 wheels) Transfers: Sit to/from Stand, Bed to chair/wheelchair/BSC Sit to Stand: Mod assist, +2 physical assistance, From elevated surface Stand pivot transfers: Mod assist, +2 safety/equipment         General transfer comment: Mod assist +2 for boost to stand from slightly elevated bed surface. Cues for WB status on Lt, well maintained. Mod assist +2 for pivot using RLE and RW to recliner. Frequent cues for UE use and upright posture, leaning forward. No overt buckling of RLE however notably weak.    Ambulation/Gait               General Gait Details: n/a at this time  Stairs            Wheelchair Mobility    Modified Rankin (Stroke Patients Only)       Balance Overall balance assessment: Needs assistance Sitting-balance support: No upper extremity supported, Feet supported Sitting balance-Leahy Scale: Fair     Standing balance support: Bilateral upper extremity supported, Reliant on assistive device for balance Standing balance-Leahy Scale: Poor Standing balance comment: Stands with light assist for balance on RLE with TDWB  on Lt.                             Pertinent Vitals/Pain Pain Assessment Pain Assessment: 0-10 Pain Score: 6  Pain Location: Lt hip Pain Descriptors / Indicators: Aching Pain Intervention(s): Monitored  during session, Repositioned, Limited activity within patient's tolerance    Home Living Family/patient expects to be discharged to:: Private residence Living Arrangements: Spouse/significant other Available Help at Discharge: Family;Available 24 hours/day (Husband cannot help physically) Type of Home: Mobile home Home Access: Stairs to enter Entrance Stairs-Rails: Right;Left;Can reach both Entrance Stairs-Number of Steps: 3   Home Layout: One level Home Equipment: Agricultural consultant (2 wheels);Cane - single point;BSC/3in1;Wheelchair - manual;Shower seat      Prior Function Prior Level of Function : Independent/Modified Independent;Driving;History of Falls (last six months)             Mobility Comments: used RW to mobilize ADLs Comments: mod I, drives     Hand Dominance   Dominant Hand: Left    Extremity/Trunk Assessment   Upper Extremity Assessment Upper Extremity Assessment: Defer to OT evaluation    Lower Extremity Assessment Lower Extremity Assessment: LLE deficits/detail LLE Deficits / Details: post op guarding as expected. LLE: Unable to fully assess due to pain       Communication   Communication: No difficulties  Cognition Arousal/Alertness: Awake/alert Behavior During Therapy: WFL for tasks assessed/performed Overall Cognitive Status: Within Functional Limits for tasks assessed                                          General Comments General comments (skin integrity, edema, etc.): SpO2 88-92% while working with therapy on 3L supplemental o2.    Exercises General Exercises - Lower Extremity Ankle Circles/Pumps: AROM, Both, 10 reps, Seated Quad Sets: Strengthening, Both, 10 reps, Seated Gluteal Sets: Strengthening, Both, 10 reps, Seated   Assessment/Plan    PT Assessment Patient needs continued PT services  PT Problem List Decreased strength;Decreased range of motion;Decreased activity tolerance;Decreased balance;Decreased  mobility;Decreased knowledge of use of DME;Decreased knowledge of precautions;Cardiopulmonary status limiting activity;Pain;Obesity       PT Treatment Interventions DME instruction;Gait training;Functional mobility training;Therapeutic activities;Therapeutic exercise;Balance training;Neuromuscular re-education;Patient/family education;Modalities;Wheelchair mobility training    PT Goals (Current goals can be found in the Care Plan section)  Acute Rehab PT Goals Patient Stated Goal: Go to SNF PT Goal Formulation: With patient Time For Goal Achievement: 03/31/23 Potential to Achieve Goals: Good    Frequency Min 3X/week     Co-evaluation               AM-PAC PT "6 Clicks" Mobility  Outcome Measure Help needed turning from your back to your side while in a flat bed without using bedrails?: A Lot Help needed moving from lying on your back to sitting on the side of a flat bed without using bedrails?: A Lot Help needed moving to and from a bed to a chair (including a wheelchair)?: Total Help needed standing up from a chair using your arms (e.g., wheelchair or bedside chair)?: Total Help needed to walk in hospital room?: Total Help needed climbing 3-5 steps with a railing? : Total 6 Click Score: 8    End of Session Equipment Utilized During Treatment: Gait belt;Oxygen Activity Tolerance: Patient limited by pain Patient left: in chair;with call bell/phone within reach;with  chair alarm set Nurse Communication: Mobility status;Weight bearing status (+2 stand pivot transfer) PT Visit Diagnosis: Unsteadiness on feet (R26.81);Muscle weakness (generalized) (M62.81);History of falling (Z91.81);Difficulty in walking, not elsewhere classified (R26.2);Pain Pain - Right/Left: Left Pain - part of body: Hip    Time: 1610-9604 PT Time Calculation (min) (ACUTE ONLY): 23 min   Charges:   PT Evaluation $PT Eval Moderate Complexity: 1 Mod PT Treatments $Therapeutic Activity: 8-22 mins         Kathlyn Sacramento, PT, DPT [facility]  Rehabilitation Services Physical Therapist Office: [phone] Website: [email or web address]   Berton Mount 03/17/2023, 11:16 AM

## 2023-03-17 NOTE — Progress Notes (Signed)
ORTHOPAEDIC PROGRESS NOTE  s/p Procedure(s): OPEN REDUCTION INTERNAL FIXATION (ORIF) ACETABULAR FRACTURE STOPPA APPROACH on 03/16/23 with Dr. Jena Gauss  SUBJECTIVE: Reports mild pain about operative site. She states she feels a little confused this morning.   No chest pain. No SOB. No nausea/vomiting. No other complaints.  OBJECTIVE: PE: General: sitting up in hospital bed, NAD MSK: incision is CDI. intact EHL/TA/GSC. Compartments soft and compressible. Warm well perfused foot.    Vitals:   03/17/23 0226 03/17/23 0700  BP: 118/68 116/62  Pulse: 90   Resp: 15   Temp: 98.3 F (36.8 C) 98.2 F (36.8 C)  SpO2: 93%      ASSESSMENT: Kristina [name] is a 72 y.o. female PDO#1  PLAN: Weightbearing: TDWB LLE Insicional and dressing care: Reinforce dressings as needed Orthopedic device(s): None Showering: Hold for now VTE prophylaxis: Lovenox 40 mg QD Pain control: PRN pain medications. Minimize narcotics as able.  Follow - up plan: 2 Gin in office with Dr. Jena Gauss Dispo: TBD. PT/OT evals today. Likely SNF. Contact information: After hours and holidays please check [email or web address] for group call information for [facility]  Alfonse Alpers, PA-C 03/17/23

## 2023-03-18 ENCOUNTER — Inpatient Hospital Stay (HOSPITAL_COMMUNITY): Payer: Medicare HMO

## 2023-03-18 DIAGNOSIS — I5032 Chronic diastolic (congestive) heart failure: Secondary | ICD-10-CM | POA: Diagnosis not present

## 2023-03-18 DIAGNOSIS — J9611 Chronic respiratory failure with hypoxia: Secondary | ICD-10-CM | POA: Diagnosis not present

## 2023-03-18 DIAGNOSIS — G4730 Sleep apnea, unspecified: Secondary | ICD-10-CM | POA: Diagnosis not present

## 2023-03-18 DIAGNOSIS — S32402A Unspecified fracture of left acetabulum, initial encounter for closed fracture: Secondary | ICD-10-CM | POA: Diagnosis not present

## 2023-03-18 LAB — BASIC METABOLIC PANEL
Anion gap: 5 (ref 5–15)
BUN: 29 mg/dL — ABNORMAL HIGH (ref 8–23)
CO2: 35 mmol/L — ABNORMAL HIGH (ref 22–32)
Calcium: 8.4 mg/dL — ABNORMAL LOW (ref 8.9–10.3)
Chloride: 96 mmol/L — ABNORMAL LOW (ref 98–111)
Creatinine, Ser: 0.66 mg/dL (ref 0.44–1.00)
GFR, Estimated: >60 mL/min (ref >60)
Glucose, Bld: 141 mg/dL — ABNORMAL HIGH (ref 70–99)
Potassium: 4.3 mmol/L (ref 3.5–5.1)
Sodium: 136 mmol/L (ref 135–145)

## 2023-03-18 LAB — GLUCOSE, CAPILLARY
Glucose-Capillary: 122 mg/dL — ABNORMAL HIGH (ref 70–99)
Glucose-Capillary: 125 mg/dL — ABNORMAL HIGH (ref 70–99)
Glucose-Capillary: 133 mg/dL — ABNORMAL HIGH (ref 70–99)
Glucose-Capillary: 142 mg/dL — ABNORMAL HIGH (ref 70–99)
Glucose-Capillary: 156 mg/dL — ABNORMAL HIGH (ref 70–99)
Glucose-Capillary: 158 mg/dL — ABNORMAL HIGH (ref 70–99)
Glucose-Capillary: 167 mg/dL — ABNORMAL HIGH (ref 70–99)

## 2023-03-18 LAB — CBC
HCT: 33.7 % — ABNORMAL LOW (ref 36.0–46.0)
Hemoglobin: 10.1 g/dL — ABNORMAL LOW (ref 12.0–15.0)
MCH: 29.3 pg (ref 26.0–34.0)
MCHC: 30 g/dL (ref 30.0–36.0)
MCV: 97.7 fL (ref 80.0–100.0)
Platelets: 174 10*3/uL (ref 150–400)
RBC: 3.45 MIL/uL — ABNORMAL LOW (ref 3.87–5.11)
RDW: 14.5 % (ref 11.5–15.5)
WBC: 12.4 10*3/uL — ABNORMAL HIGH (ref 4.0–10.5)
nRBC: 0 % (ref 0.0–0.2)

## 2023-03-18 LAB — MAGNESIUM: Magnesium: 2.6 mg/dL — ABNORMAL HIGH (ref 1.7–2.4)

## 2023-03-18 MED ORDER — FUROSEMIDE 40 MG PO TABS
40.0000 mg | ORAL_TABLET | Freq: Every day | ORAL | Status: DC
  Administered 2023-03-18 – 2023-03-20 (×3): 40 mg via ORAL
  Filled 2023-03-18 (×3): qty 1

## 2023-03-18 NOTE — NC FL2 (Signed)
[place] MEDICAID FL2 LEVEL OF CARE FORM     IDENTIFICATION  Patient Name: Kristina [name] Birthdate: 1951-01-23 Sex: female Admission Date (Current Location): 03/14/2023  [facility] and [place] Number:  [employer] and Address:  [facility] [place], [address], [place], [place] [address]      Provider Number: [id number]  Attending Physician Name and Address:  Cleora Fleet, MD  Relative Name and Phone Number:  Sunday Shams [phone]    Current Level of Care: Hospital Recommended Level of Care: Skilled Nursing Facility Prior Approval Number:    Date Approved/Denied:   PASRR Number: [id number]  Discharge Plan: SNF    Current Diagnoses: Patient Active Problem List   Diagnosis Date Noted   Acetabular fracture (HCC) 03/14/2023   OP (osteoporosis) 01/22/2023   Sleep apnea 09/08/2021   Chronic heart failure with preserved ejection fraction (HFpEF) (HCC) 04/09/2021   Hypoxia    Essential hypertension 10/08/2019   Uncontrolled diabetes mellitus 10/08/2019   UTI (urinary tract infection) 10/08/2019   Thrush 10/08/2019   Pneumonia due to COVID-19 virus 10/08/2019   Chronic respiratory failure with hypoxia (HCC) 10/02/2019   Acute respiratory distress syndrome (ARDS) due to COVID-19 virus (HCC) 10/02/2019   Special screening for malignant neoplasms, colon 11/06/2016    Orientation RESPIRATION BLADDER Height & Weight     Self, Time, Situation, Place  O2 (4l [place]) Continent, External catheter Weight: 168 lb 14 oz (76.6 kg) Height:  4\' 10"  (147.3 cm)  BEHAVIORAL SYMPTOMS/MOOD NEUROLOGICAL BOWEL NUTRITION STATUS      Continent Diet (see dc summary)  AMBULATORY STATUS COMMUNICATION OF NEEDS Skin   Extensive Assist Verbally Surgical wounds (Closed incision-perinium 03/13/23)                       Personal Care Assistance Level of Assistance  Bathing, Feeding, Dressing Bathing Assistance: Limited assistance Feeding assistance:  Independent Dressing Assistance: Limited assistance     Functional Limitations Info  Sight, Hearing, Speech Sight Info: Impaired Hearing Info: Adequate Speech Info: Adequate    SPECIAL CARE FACTORS FREQUENCY  PT (By licensed PT), OT (By licensed OT)     PT Frequency: 5x week OT Frequency: 5x week            Contractures Contractures Info: Not present    Additional Factors Info  Code Status, Allergies, Insulin Sliding Scale Code Status Info: Full Allergies Info: NKA   Insulin Sliding Scale Info: see dc summary       Current Medications (03/18/2023):  This is the current hospital active medication list Current Facility-Administered Medications  Medication Dose Route Frequency Provider Last Rate Last Admin   0.9 %  sodium chloride infusion   Intravenous Continuous [place], PA-C 660 mL/hr at 03/16/23 1619 New Bag at 03/16/23 1619   acetaminophen (TYLENOL) tablet 325-650 mg  325-650 mg Oral Q6H PRN [place], PA-C       albuterol (PROVENTIL) (2.5 MG/3ML) 0.083% nebulizer solution 2.5 mg  2.5 mg Nebulization Q6H PRN [place], PA-C   2.5 mg at 03/15/23 1659   amLODipine (NORVASC) tablet 10 mg  10 mg Oral Daily [place], PA-C   10 mg at 03/18/23 0806   carvedilol (COREG) tablet 6.25 mg  6.25 mg Oral BID WC Thyra Breed A, PA-C   6.25 mg at 03/18/23 3474   Chlorhexidine Gluconate Cloth 2 % PADS 6 each  6 each Topical Daily Sherryll Burger, Pratik  D, DO   6 each at 03/18/23 0807   docusate sodium (COLACE) capsule 100 mg  100 mg Oral BID [place], PA-C   100 mg at 03/18/23 0806   enoxaparin (LOVENOX) injection 40 mg  40 mg Subcutaneous Q24H [place], PA-C   40 mg at 03/18/23 1610   feeding supplement (ENSURE ENLIVE / ENSURE PLUS) liquid 237 mL  237 mL Oral Q24H [place], PA-C   237 mL at 03/17/23 2020   furosemide (LASIX) tablet 40 mg  40 mg Oral Daily Johnson, Clanford L, MD   40 mg at 03/18/23 1237   hydrALAZINE (APRESOLINE) injection  10 mg  10 mg Intravenous Q4H PRN [place], PA-C       HYDROcodone-acetaminophen (NORCO) 7.5-325 MG per tablet 1-2 tablet  1-2 tablet Oral Q4H PRN [place], PA-C       HYDROcodone-acetaminophen (NORCO/VICODIN) 5-325 MG per tablet 1-2 tablet  1-2 tablet Oral Q6H PRN [place], PA-C   1 tablet at 03/18/23 9604   insulin aspart (novoLOG) injection 0-9 Units  0-9 Units Subcutaneous Q4H [place], PA-C   2 Units at 03/18/23 5409   losartan (COZAAR) tablet 100 mg  100 mg Oral Daily [place], PA-C   100 mg at 03/18/23 8119   methocarbamol (ROBAXIN) tablet 500 mg  500 mg Oral Q6H PRN [place], PA-C   500 mg at 03/17/23 2126   Or   methocarbamol (ROBAXIN) 500 mg in dextrose 5 % 50 mL IVPB  500 mg Intravenous Q6H PRN [place], PA-C       metoCLOPramide (REGLAN) tablet 5-10 mg  5-10 mg Oral Q8H PRN Sharon Seller, Sarah A, PA-C       Or   metoCLOPramide (REGLAN) injection 5-10 mg  5-10 mg Intravenous Q8H PRN [place], PA-C       morphine (PF) 2 MG/ML injection 0.5 mg  0.5 mg Intravenous Q2H PRN [place], PA-C   0.5 mg at 03/16/23 1601   multivitamin with minerals tablet 1 tablet  1 tablet Oral Daily [place], PA-C   1 tablet at 03/18/23 0806   ondansetron (ZOFRAN) tablet 4 mg  4 mg Oral Q6H PRN [place], PA-C       Or   ondansetron (ZOFRAN) injection 4 mg  4 mg Intravenous Q6H PRN McClung, Sarah A, PA-C       pantoprazole (PROTONIX) EC tablet 40 mg  40 mg Oral Daily Thyra Breed A, PA-C   40 mg at 03/18/23 1478   polyethylene glycol (MIRALAX / GLYCOLAX) packet 17 g  17 g Oral Daily PRN Thyra Breed A, PA-C       rosuvastatin (CRESTOR) tablet 10 mg  10 mg Oral Daily [place], PA-C   10 mg at 03/18/23 2956   Vitamin D (Ergocalciferol) (DRISDOL) 1.25 MG (50000 UNIT) capsule 50,000 Units  50,000 Units Oral Q7 days Erick Blinks, DO   50,000 Units at 03/17/23 1551     Discharge Medications: Please see discharge summary  for a list of discharge medications.  Relevant Imaging Results:  Relevant Lab Results:   Additional Information SSN [id number] LCSWA

## 2023-03-18 NOTE — Progress Notes (Signed)
Patient refused Cpap for the night

## 2023-03-18 NOTE — Progress Notes (Signed)
O2 82% on 2L. O2 turned to 4L 92% on 4L. MD notified.

## 2023-03-18 NOTE — Progress Notes (Signed)
PROGRESS NOTE    Kristina [name]  IRJ:[id number] DOB: 08/09/1951 DOA: 03/14/2023 PCP: Benita Stabile, MD   Brief Narrative:  Kristina [name] is a 72 y.o. female with medical history significant of CHF DM2 hx of ARDS after COVID    Presented with fall and closed nondisplaced fracture of the acetabulum and underwent ORIF 6/21.  PT/OT recommending SNF placement.   Assessment & Plan:   Principal Problem:   Acetabular fracture (HCC) Active Problems:   Chronic respiratory failure with hypoxia (HCC)   Chronic heart failure with preserved ejection fraction (HFpEF) (HCC)   Sleep apnea  Assessment and Plan:  Acetabular fracture (HCC) status post ORIF 6/21  - Further management per orthopedics -PT/OT recommending SNF placement, TOC working on placement  Chronic heart failure with preserved ejection fraction (HFpEF)  Well compensated euvolemic currently Resumed home furosemide 40 mg daily Restarted Coreg 6.25mg  po BID Hydralazine as needed for severe blood pressure elevations   Type 2 diabetes mellitus  - Sensitive SSI and frequent CBG monitoring  -TSH 0.56 and A1c 5.7%.  - Hold by mouth medications     Sleep apnea Use CPAP at night   Chronic respiratory failure with hypoxia  Chronic post-COVID continue use of oxygen as needed Patient states sometimes her oxygen goes down into high 80s and she just takes a few deep breaths and she does better but she does have an oxygen tank at home - pulse ox down in 80s again today - encourage incentive spirometry - portable CXR ordered  - RN increased supplemental oxygen to 4L  Vitamin D deficiency -Started on replacement protocol 6/22   Obesity BMI 35.29  DVT prophylaxis:Lovenox Code Status: Full Family Communication: Daughter at bedside updated Disposition Plan:  Status is: Inpatient Remains inpatient appropriate because: Need for IV medications and inpatient procedure.   Consultants:  Orthopedics  Procedures:  ORIF to left  acetabulum 6/21  Antimicrobials:  None   Subjective: Pt having more hypoxia, supplemental oxygen increased, agreeable to SNF, incentive spirometry encouraged  Objective: Vitals:   03/17/23 2018 03/18/23 0056 03/18/23 0405 03/18/23 0740  BP: 131/65  126/62 120/63  Pulse: 89 86 87 96  Resp: 19 13 16 18   Temp: 98.4 F (36.9 C)   98.7 F (37.1 C)  TempSrc: Oral   Oral  SpO2: 97% 93% 93% 92%  Weight:      Height:        Intake/Output Summary (Last 24 hours) at 03/18/2023 1206 Last data filed at 03/18/2023 0500 Gross per 24 hour  Intake 1237 ml  Output 700 ml  Net 537 ml   Filed Weights   03/14/23 2357  Weight: 76.6 kg    Examination:  General exam: Appears calm and comfortable  Respiratory system: Clear to auscultation. Respiratory effort normal.  Nasal cannula Cardiovascular system: normal S1 & S2 heard.  Gastrointestinal system: Abdomen is soft, ND/NT, No HSM Central nervous system: Alert and awake Extremities: No edema, incision C/D/I Skin: No significant lesions noted Psychiatry: normal   Data Reviewed: I have personally reviewed following labs and imaging studies  CBC: Recent Labs  Lab 03/14/23 1340 03/16/23 0651 03/16/23 1632 03/17/23 0351 03/18/23 0135  WBC 8.9 13.2* 14.0* 12.5* 12.4*  NEUTROABS 6.6  --   --   --   --   HGB 15.2* 13.8 12.7 11.2* 10.1*  HCT 49.2* 46.7* 42.1 38.0 33.7*  MCV 92.3 96.1 95.9 96.4 97.7  PLT 221 189 179 187 174   Basic Metabolic  Panel: Recent Labs  Lab 03/14/23 1340 03/14/23 2054 03/16/23 0651 03/16/23 1632 03/17/23 0351 03/18/23 0135  NA 137  --  139  --  139 136  K 3.5  --  3.7  --  4.5 4.3  CL 95*  --  95*  --  97* 96*  CO2 32  --  33*  --  37* 35*  GLUCOSE 165*  --  154*  --  134* 141*  BUN 13  --  17  --  19 29*  CREATININE 0.66  --  0.63 0.52 0.72 0.66  CALCIUM 8.8*  --  8.9  --  8.3* 8.4*  MG  --  1.9 2.3  --  2.4 2.6*  PHOS  --  3.2  --   --   --   --    GFR: Estimated Creatinine Clearance: 56.2  mL/min (by C-G formula based on SCr of 0.66 mg/dL). Liver Function Tests: Recent Labs  Lab 03/14/23 2054  AST 30  ALT 17  ALKPHOS 69  BILITOT 0.7  PROT 6.6  ALBUMIN 3.6   No results for input(s): "LIPASE", "AMYLASE" in the last 168 hours. No results for input(s): "AMMONIA" in the last 168 hours. Coagulation Profile: Recent Labs  Lab 03/14/23 2054  INR 0.9   Cardiac Enzymes: Recent Labs  Lab 03/14/23 2054  CKTOTAL 95   BNP (last 3 results) No results for input(s): "PROBNP" in the last 8760 hours. HbA1C: No results for input(s): "HGBA1C" in the last 72 hours.  CBG: Recent Labs  Lab 03/17/23 2036 03/18/23 0005 03/18/23 0403 03/18/23 0751 03/18/23 1155  GLUCAP 116* 133* 122* 158* 125*   Lipid Profile: No results for input(s): "CHOL", "HDL", "LDLCALC", "TRIG", "CHOLHDL", "LDLDIRECT" in the last 72 hours. Thyroid Function Tests: No results for input(s): "TSH", "T4TOTAL", "FREET4", "T3FREE", "THYROIDAB" in the last 72 hours.  Anemia Panel: No results for input(s): "VITAMINB12", "FOLATE", "FERRITIN", "TIBC", "IRON", "RETICCTPCT" in the last 72 hours. Sepsis Labs: No results for input(s): "PROCALCITON", "LATICACIDVEN" in the last 168 hours.  Recent Results (from the past 240 hour(s))  Surgical pcr screen     Status: None   Collection Time: 03/15/23  3:36 PM   Specimen: Nasal Mucosa; Nasal Swab  Result Value Ref Range Status   MRSA, PCR NEGATIVE NEGATIVE Final   Staphylococcus aureus NEGATIVE NEGATIVE Final    Comment: (NOTE) The Xpert SA Assay (FDA approved for NASAL specimens in patients 25 years of age and older), is one component of a comprehensive surveillance program. It is not intended to diagnose infection nor to guide or monitor treatment. Performed at [facility], [address], [place], [place] [address]     Radiology Studies: DG Pelvis Comp Min 3V  Result Date: 03/16/2023 CLINICAL DATA:  Left acetabular fracture. EXAM: JUDET PELVIS - 3+  VIEW COMPARISON:  Fluoroscopic images of same day. FINDINGS: Status post surgical internal fixation of left acetabular fracture with good alignment of fracture components. Mildly displaced left inferior pubic ramus fracture is also noted. IMPRESSION: Status post surgical internal fixation of left acetabular fracture. Continued presence of mildly displaced left inferior pubic ramus fracture. Electronically Signed   By: Lupita Raider M.D.   On: 03/16/2023 16:18    Scheduled Meds:  amLODipine  10 mg Oral Daily   carvedilol  6.25 mg Oral BID WC   Chlorhexidine Gluconate Cloth  6 each Topical Daily   docusate sodium  100 mg Oral BID   enoxaparin (LOVENOX) injection  40 mg Subcutaneous Q24H   feeding supplement  237 mL Oral Q24H   insulin aspart  0-9 Units Subcutaneous Q4H   losartan  100 mg Oral Daily   multivitamin with minerals  1 tablet Oral Daily   pantoprazole  40 mg Oral Daily   rosuvastatin  10 mg Oral Daily   Vitamin D (Ergocalciferol)  50,000 Units Oral Q7 days   Continuous Infusions:  sodium chloride 660 mL/hr at 03/16/23 1619   methocarbamol (ROBAXIN) IV       LOS: 4 days    Time spent: 35 minutes  Pinky Ravan Laural Benes, MD How to contact the [facility] Attending or Consulting provider 7A - 7P or covering provider during after hours 7P -7A, for this patient?  Check the care team in [facility] and look for a) attending/consulting [facility] provider listed and b) the [facility] team listed Log into [email or web address] and use [facility]'s universal password to access. If you do not have the password, please contact the hospital operator. Locate the [facility] provider you are looking for under [facility] and page to a number that you can be directly reached. If you still have difficulty reaching the provider, please page the [facility] (Director on Call) for the Hospitalists listed on amion for assistance.   If 7PM-7AM, please contact night-coverage [email or web address] 03/18/2023, 12:06 PM

## 2023-03-18 NOTE — Progress Notes (Signed)
ORTHOPAEDIC PROGRESS NOTE  s/p Procedure(s): OPEN REDUCTION INTERNAL FIXATION (ORIF) ACETABULAR FRACTURE STOPPA APPROACH on 03/16/23 with Dr. Jena Gauss  SUBJECTIVE: Reports moderate pain about operative site. Confusion better this morning. But continues to be very tired.   No chest pain. No SOB. No nausea/vomiting. No other complaints.  OBJECTIVE: PE: General: sitting up in hospital bed, NAD MSK: incision is CDI. intact EHL/TA/GSC. Compartments soft and compressible. Warm well perfused foot.    Vitals:   03/18/23 0056 03/18/23 0405  BP:  126/62  Pulse: 86 87  Resp: 13 16  Temp:    SpO2: 93% 93%     ASSESSMENT: Kristina [name] is a 72 y.o. female PDO#2  PLAN: Weightbearing: TDWB LLE Insicional and dressing care: Reinforce dressings as needed Orthopedic device(s): None Showering: Hold for now VTE prophylaxis: Lovenox 40 mg QD Pain control: PRN pain medications. Minimize narcotics as able.  Follow - up plan: 2 [place] in office with Dr. Jena Gauss Dispo: TBD. PT/OT recommending "continued inpatient follow up therapy." Anticipate SNF. Contact information: After hours and holidays please check [email or web address] for group call information for [facility]  Alfonse Alpers, PA-C 03/18/23

## 2023-03-18 NOTE — TOC Initial Note (Signed)
Transition of Care [facility]) - Initial/Assessment Note    Patient Details  Name: Kristina [name] MRN: [id number] Date of Birth: 1951/04/22  Transition of Care [facility]) CM/SW Contact:    Carmina Miller, LCSWA Phone Number: 03/18/2023, 3:49 PM  Clinical Narrative:                  CSW spoke with pt's daughter Olegario Messier in reference to STR recommendation, Olegario Messier is in agreement and request [facility] as her first choice. CSW explained insurance auth process and Medicare.gov ratings website, all other questions answered. TOC will continue to follow.        Patient Goals and CMS Choice            Expected Discharge Plan and Services                                              Prior Living Arrangements/Services                       Activities of Daily Living Home Assistive Devices/Equipment: CBG Meter, CPAP, Walker (specify type) ADL Screening (condition at time of admission) Patient's cognitive ability adequate to safely complete daily activities?: No Is the patient deaf or have difficulty hearing?: No Does the patient have difficulty seeing, even when wearing glasses/contacts?: No Does the patient have difficulty concentrating, remembering, or making decisions?: No Patient able to express need for assistance with ADLs?: Yes Does the patient have difficulty dressing or bathing?: No Independently performs ADLs?: Yes (appropriate for developmental age) Does the patient have difficulty walking or climbing stairs?: No Weakness of Legs: None Weakness of Arms/Hands: None  Permission Sought/Granted                  Emotional Assessment              Admission diagnosis:  Acetabular fracture (HCC) [S32.409A] Closed nondisplaced fracture of acetabulum, unspecified portion of acetabulum, unspecified laterality, initial encounter (HCC) [S32.409A] Patient Active Problem List   Diagnosis Date Noted   Acetabular fracture (HCC) 03/14/2023   OP (osteoporosis)  01/22/2023   Sleep apnea 09/08/2021   Chronic heart failure with preserved ejection fraction (HFpEF) (HCC) 04/09/2021   Hypoxia    Essential hypertension 10/08/2019   Uncontrolled diabetes mellitus 10/08/2019   UTI (urinary tract infection) 10/08/2019   Thrush 10/08/2019   Pneumonia due to COVID-19 virus 10/08/2019   Chronic respiratory failure with hypoxia (HCC) 10/02/2019   Acute respiratory distress syndrome (ARDS) due to COVID-19 virus (HCC) 10/02/2019   Special screening for malignant neoplasms, colon 11/06/2016   PCP:  Benita Stabile, MD Pharmacy:   [facility] [id number] - [place], [place] - [address] [place] [address] Phone: [phone] Fax: [phone]     Social Determinants of Health (SDOH) Social History: SDOH Screenings   Food Insecurity: No Food Insecurity (03/14/2023)  Housing: Patient Declined (03/15/2023)  Transportation Needs: No Transportation Needs (03/14/2023)  Utilities: Not At Risk (03/14/2023)  Tobacco Use: Low Risk  (03/16/2023)   SDOH Interventions:     Readmission Risk Interventions     No data to display

## 2023-03-19 DIAGNOSIS — S32402A Unspecified fracture of left acetabulum, initial encounter for closed fracture: Secondary | ICD-10-CM | POA: Diagnosis not present

## 2023-03-19 LAB — CBC
HCT: 33.8 % — ABNORMAL LOW (ref 36.0–46.0)
Hemoglobin: 10.2 g/dL — ABNORMAL LOW (ref 12.0–15.0)
MCH: 29.2 pg (ref 26.0–34.0)
MCHC: 30.2 g/dL (ref 30.0–36.0)
MCV: 96.8 fL (ref 80.0–100.0)
Platelets: 174 10*3/uL (ref 150–400)
RBC: 3.49 MIL/uL — ABNORMAL LOW (ref 3.87–5.11)
RDW: 14.3 % (ref 11.5–15.5)
WBC: 9 10*3/uL (ref 4.0–10.5)
nRBC: 0 % (ref 0.0–0.2)

## 2023-03-19 LAB — GLUCOSE, CAPILLARY
Glucose-Capillary: 133 mg/dL — ABNORMAL HIGH (ref 70–99)
Glucose-Capillary: 119 mg/dL — ABNORMAL HIGH (ref 70–99)
Glucose-Capillary: 114 mg/dL — ABNORMAL HIGH (ref 70–99)
Glucose-Capillary: 132 mg/dL — ABNORMAL HIGH (ref 70–99)
Glucose-Capillary: 129 mg/dL — ABNORMAL HIGH (ref 70–99)

## 2023-03-19 LAB — BASIC METABOLIC PANEL
Anion gap: 8 (ref 5–15)
BUN: 22 mg/dL (ref 8–23)
CO2: 35 mmol/L — ABNORMAL HIGH (ref 22–32)
Calcium: 8.3 mg/dL — ABNORMAL LOW (ref 8.9–10.3)
Chloride: 97 mmol/L — ABNORMAL LOW (ref 98–111)
Creatinine, Ser: 0.51 mg/dL (ref 0.44–1.00)
GFR, Estimated: >60 mL/min (ref >60)
Glucose, Bld: 134 mg/dL — ABNORMAL HIGH (ref 70–99)
Potassium: 3.9 mmol/L (ref 3.5–5.1)
Sodium: 140 mmol/L (ref 135–145)

## 2023-03-19 LAB — MAGNESIUM: Magnesium: 2.2 mg/dL (ref 1.7–2.4)

## 2023-03-19 MED ORDER — ENOXAPARIN SODIUM 40 MG/0.4ML IJ SOSY: 40.0000 mg | PREFILLED_SYRINGE | INTRAMUSCULAR | 0 refills | Status: DC

## 2023-03-19 MED ORDER — HYDROCODONE-ACETAMINOPHEN 5-325 MG PO TABS: 1.0000 | ORAL_TABLET | Freq: Four times a day (QID) | ORAL | 0 refills | Status: DC | PRN

## 2023-03-19 MED ORDER — ENSURE ENLIVE PO LIQD: 237.0000 mL | ORAL | 12 refills | Status: DC

## 2023-03-19 MED ORDER — VITAMIN D 50 MCG (2000 UT) PO CAPS: 2000.0000 [IU] | ORAL_CAPSULE | Freq: Every day | ORAL | 0 refills | Status: DC

## 2023-03-19 MED ORDER — HYDROCODONE-ACETAMINOPHEN 7.5-325 MG PO TABS
1.0000 | ORAL_TABLET | Freq: Four times a day (QID) | ORAL | Status: DC | PRN
  Administered 2023-03-19 – 2023-03-20 (×3): 1 via ORAL
  Filled 2023-03-19 (×3): qty 1

## 2023-03-19 MED ORDER — BISACODYL 5 MG PO TBEC
5.0000 mg | DELAYED_RELEASE_TABLET | Freq: Every day | ORAL | Status: DC | PRN
  Administered 2023-03-19: 5 mg via ORAL
  Filled 2023-03-19: qty 1

## 2023-03-19 MED ORDER — DIPHENHYDRAMINE-ZINC ACETATE 2-0.1 % EX CREA
TOPICAL_CREAM | Freq: Three times a day (TID) | CUTANEOUS | Status: DC | PRN
  Filled 2023-03-19 (×2): qty 28

## 2023-03-19 MED ORDER — DOCUSATE SODIUM 100 MG PO CAPS: 100.0000 mg | ORAL_CAPSULE | Freq: Two times a day (BID) | ORAL | 0 refills | Status: AC

## 2023-03-19 MED ORDER — APIXABAN 2.5 MG PO TABS: 2.5000 mg | ORAL_TABLET | Freq: Two times a day (BID) | ORAL | 0 refills | Status: DC

## 2023-03-19 MED ORDER — METHOCARBAMOL 500 MG PO TABS: 500.0000 mg | ORAL_TABLET | Freq: Four times a day (QID) | ORAL | 0 refills | Status: AC | PRN

## 2023-03-19 NOTE — Progress Notes (Addendum)
Orthopaedic Trauma Progress Note  SUBJECTIVE: Doing okay this morning, feels ready to get out of the bed.  Low back is starting to ache and feel itchy.  Left hip/pelvis with pain which is manageable.  Patient with no BM since admission.  Denies any abdominal pain today.  Will add Dulcolax.  No chest pain. No SOB. No nausea/vomiting. No other complaints.  Daughter at bedside.  Both patient and daughter agreeable to SNF.  OBJECTIVE:  Vitals:   03/18/23 2048 03/19/23 0408  BP: 135/62 104/71  Pulse: 96 92  Resp: 17 20  Temp: 98.4 F (36.9 C) 98.3 F (36.8 C)  SpO2: 94% 94%    General: Sitting up in bed, no acute distress Respiratory: No increased work of breathing.  LLE: Dressing over Pfannenstiel incision is clean, dry, intact.  No significant tenderness with palpation to over the left hip.  No significant thigh or calf tenderness with palpation.  Compartment soft compressible.  Ankle dorsiflexion/plantarflexion is intact.  Endorses sensation all aspects of the foot.  Able to wiggle the toes.  Neurovascularly intact  IMAGING: Stable post op imaging.   LABS:  Results for orders placed or performed during the hospital encounter of 03/14/23 (from the past 24 hour(s))  Glucose, capillary     Status: Abnormal   Collection Time: 03/18/23  7:51 AM  Result Value Ref Range   Glucose-Capillary 158 (H) 70 - 99 mg/dL  Glucose, capillary     Status: Abnormal   Collection Time: 03/18/23 11:55 AM  Result Value Ref Range   Glucose-Capillary 125 (H) 70 - 99 mg/dL  Glucose, capillary     Status: Abnormal   Collection Time: 03/18/23  4:52 PM  Result Value Ref Range   Glucose-Capillary 142 (H) 70 - 99 mg/dL  Glucose, capillary     Status: Abnormal   Collection Time: 03/18/23  9:10 PM  Result Value Ref Range   Glucose-Capillary 156 (H) 70 - 99 mg/dL  Glucose, capillary     Status: Abnormal   Collection Time: 03/18/23 11:55 PM  Result Value Ref Range   Glucose-Capillary 167 (H) 70 - 99 mg/dL   Glucose, capillary     Status: Abnormal   Collection Time: 03/19/23  4:08 AM  Result Value Ref Range   Glucose-Capillary 133 (H) 70 - 99 mg/dL  CBC     Status: Abnormal   Collection Time: 03/19/23  4:50 AM  Result Value Ref Range   WBC 9.0 4.0 - 10.5 K/uL   RBC 3.49 (L) 3.87 - 5.11 MIL/uL   Hemoglobin 10.2 (L) 12.0 - 15.0 g/dL   HCT 07.3 (L) 71.0 - 62.6 %   MCV 96.8 80.0 - 100.0 fL   MCH 29.2 26.0 - 34.0 pg   MCHC 30.2 30.0 - 36.0 g/dL   RDW 94.8 54.6 - 27.0 %   Platelets 174 150 - 400 K/uL   nRBC 0.0 0.0 - 0.2 %  Basic metabolic panel     Status: Abnormal   Collection Time: 03/19/23  4:50 AM  Result Value Ref Range   Sodium 140 135 - 145 mmol/L   Potassium 3.9 3.5 - 5.1 mmol/L   Chloride 97 (L) 98 - 111 mmol/L   CO2 35 (H) 22 - 32 mmol/L   Glucose, Bld 134 (H) 70 - 99 mg/dL   BUN 22 8 - 23 mg/dL   Creatinine, Ser 3.50 0.44 - 1.00 mg/dL   Calcium 8.3 (L) 8.9 - 10.3 mg/dL   GFR, Estimated >09 >38  mL/min   Anion gap 8 5 - 15  Magnesium     Status: None   Collection Time: 03/19/23  4:50 AM  Result Value Ref Range   Magnesium 2.2 1.7 - 2.4 mg/dL    ASSESSMENT: Kristina [name] is a 72 y.o. female, 3 Days Post-Op s/p OPEN REDUCTION INTERNAL FIXATION LEFT ACETABULAR FRACTURE STOPPA APPROACH  CV/Blood loss: Acute blood loss anemia, Hgb 10.2 this AM. Stable. Hemodynamically stable  PLAN: Weightbearing: TDWB LLE ROM: Okay for unrestricted motion Incisional and dressing care: Okay to leave incisions open to air Showering: Okay to begin showering and getting incisions wet 03/20/2023 Orthopedic device(s): None  Pain management:  1. Tylenol 325-650 mg q 6 hours PRN 2. Robaxin 500 mg q 6 hours PRN 3. Norco 5-325 mg OR 7.5-325 mg mg q 6 hours PRN 4. Morphine 0.5 mg q 2 hours PRN VTE prophylaxis: Lovenox, SCDs ID:  Ancef 2gm post op Foley/Lines: Foley still in place.  Remove today. KVO IVFs Impediments to Fracture Healing: Vit D level 29, start supplementation Dispo: PT/OT eval  ongoing. Recommending SNF.  Okay for discharge from ortho standpoint once cleared by medicine team and therapies.  I have signed and placed discharge Rx for Vit D and DVT ppx in patient's chart  D/C recommendations: - Norco and Robaxin for pain control - Eliquis 2.5 mg BID x 30 days for DVT prophylaxis - Continue 2000 units Vit D supplementation x 90 days  Follow - up plan: 2 Strey after discharge for wound check and repeat x-rays   Contact information:  Truitt Merle MD, Thyra Breed PA-C. After hours and holidays please check [email or web address] for group call information for [facility]   Thompson Caul, PA-C [phone] (office) [email or web address]

## 2023-03-19 NOTE — TOC Progression Note (Signed)
Transition of Care [facility]) - Progression Note    Patient Details  Name: Kristina [name] MRN: [id number] Date of Birth: 09/14/1951  Transition of Care [facility]) CM/SW Contact  Lorri Frederick, LCSW Phone Number: 03/19/2023, 2:32 PM  Clinical Narrative:   Bed offers provided to pt and daughter Olegario Messier.  No offer from [facility].  They are requesting response from [facility].   CSW reached out to that facility.   1600: [facility] can offer bed, CSW spoke with pt who does accept.   Angela/CMA will start auth.   1615: Drucie Opitz approved: Certification number [id number].  6/25-7/7.    Expected Discharge Plan: Skilled Nursing Facility Barriers to Discharge: Continued Medical Work up  Expected Discharge Plan and Services In-house Referral: Clinical Social Work   Post Acute Care Choice: Skilled Nursing Facility Living arrangements for the past 2 months: Single Family Home Expected Discharge Date: 03/19/23                                     Social Determinants of Health (SDOH) Interventions SDOH Screenings   Food Insecurity: No Food Insecurity (03/14/2023)  Housing: Patient Declined (03/15/2023)  Transportation Needs: No Transportation Needs (03/14/2023)  Utilities: Not At Risk (03/14/2023)  Tobacco Use: Low Risk  (03/16/2023)    Readmission Risk Interventions     No data to display

## 2023-03-19 NOTE — Progress Notes (Signed)
PROGRESS NOTE    Kristina [name]  ZOX:[id number] DOB: [birth year] DOA: 03/14/2023 PCP: Benita Stabile, MD   Brief Narrative:  Kristina [name] is a 72 y.o. 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  PT/OT evaluation recommending SNF.   Assessment & Plan:   Principal Problem:   Acetabular fracture (HCC) Active Problems:   Chronic heart failure with preserved ejection fraction (HFpEF) (HCC)   Chronic respiratory failure with hypoxia (HCC)   Sleep apnea  Assessment and Plan:  Acetabular fracture (HCC) status post ORIF 6/21  - Further management per orthopedics -PT/OT evaluation for SNF  Chronic heart failure with preserved ejection fraction (HFpEF) (HCC) Well compensated euvolemic currently Lasix resumed 6/23 Restart Coreg 6.25mg  po BID Hydralazine as needed for severe blood pressure elevations    Uncontrolled diabetes mellitus (HCC)  - Order Sensitive SSI    -TSH 0.56 and A1c 5.7%.  - Hold by mouth medications      Sleep apnea Use CPAP at night   Chronic respiratory failure with hypoxia (HCC) Chronic post-COVID continue use of oxygen as needed Patient states sometimes her oxygen goes down into high 80s and she just takes a few deep breaths and she does better but she does have an oxygen tank at home  Vitamin D deficiency -Started on replacement protocol 6/22   Obesity BMI 35.29    DVT prophylaxis:Lovenox Code Status: Full Family Communication: Daughter at bedside 6/21 Disposition Plan:  Status is: Inpatient Remains inpatient appropriate because: Need for IV medications and inpatient procedure.   Consultants:  Orthopedics  Procedures:  ORIF to left acetabulum 6/21  Antimicrobials:  None   Subjective: Patient seen and evaluated today and appears slightly restless. Complaining of itching to her back after morphine  yesterday.  Objective: Vitals:   03/18/23 1328 03/18/23 2048 03/19/23 0408 03/19/23 0745  BP: 137/61 135/62 104/71 139/75  Pulse: 92 96 92 92  Resp: 19 17 20    Temp: 98.5 F (36.9 C) 98.4 F (36.9 C) 98.3 F (36.8 C) 98.1 F (36.7 C)  TempSrc: Oral Oral Oral Oral  SpO2: 95% 94% 94% 95%  Weight:      Height:        Intake/Output Summary (Last 24 hours) at 03/19/2023 1423 Last data filed at 03/19/2023 1200 Gross per 24 hour  Intake 0 ml  Output 3050 ml  Net -3050 ml   Filed Weights   03/14/23 2357  Weight: 76.6 kg    Examination:  General exam: Appears calm and comfortable  Respiratory system: Clear to auscultation. Respiratory effort normal.  Nasal cannula Cardiovascular system: S1 & S2 heard, RRR.  Gastrointestinal system: Abdomen is soft Central nervous system: Alert and awake Extremities: No edema, incision C/D/I Skin: No significant lesions noted Psychiatry: Flat affect.    Data Reviewed: I have personally reviewed following labs and imaging studies  CBC: Recent Labs  Lab 03/14/23 1340 03/16/23 0651 03/16/23 1632 03/17/23 0351 03/18/23 0135 03/19/23 0450  WBC 8.9 13.2* 14.0* 12.5* 12.4* 9.0  NEUTROABS 6.6  --   --   --   --   --   HGB 15.2* 13.8 12.7 11.2* 10.1* 10.2*  HCT 49.2* 46.7* 42.1 38.0 33.7* 33.8*  MCV 92.3 96.1 95.9 96.4 97.7 96.8  PLT 221 189 179 187 174 174   Basic Metabolic Panel: Recent Labs  Lab 03/14/23  1340 03/14/23 2054 03/16/23 0651 03/16/23 1632 03/17/23 0351 03/18/23 0135 03/19/23 0450  NA 137  --  139  --  139 136 140  K 3.5  --  3.7  --  4.5 4.3 3.9  CL 95*  --  95*  --  97* 96* 97*  CO2 32  --  33*  --  37* 35* 35*  GLUCOSE 165*  --  154*  --  134* 141* 134*  BUN 13  --  17  --  19 29* 22  CREATININE 0.66  --  0.63 0.52 0.72 0.66 0.51  CALCIUM 8.8*  --  8.9  --  8.3* 8.4* 8.3*  MG  --  1.9 2.3  --  2.4 2.6* 2.2  PHOS  --  3.2  --   --   --   --   --    GFR: Estimated Creatinine Clearance: 56.2 mL/min (by C-G  formula based on SCr of 0.51 mg/dL). Liver Function Tests: Recent Labs  Lab 03/14/23 2054  AST 30  ALT 17  ALKPHOS 69  BILITOT 0.7  PROT 6.6  ALBUMIN 3.6   No results for input(s): "LIPASE", "AMYLASE" in the last 168 hours. No results for input(s): "AMMONIA" in the last 168 hours. Coagulation Profile: Recent Labs  Lab 03/14/23 2054  INR 0.9   Cardiac Enzymes: Recent Labs  Lab 03/14/23 2054  CKTOTAL 95   BNP (last 3 results) No results for input(s): "PROBNP" in the last 8760 hours. HbA1C: No results for input(s): "HGBA1C" in the last 72 hours.  CBG: Recent Labs  Lab 03/18/23 2110 03/18/23 2355 03/19/23 0408 03/19/23 0747 03/19/23 1220  GLUCAP 156* 167* 133* 119* 114*   Lipid Profile: No results for input(s): "CHOL", "HDL", "LDLCALC", "TRIG", "CHOLHDL", "LDLDIRECT" in the last 72 hours. Thyroid Function Tests: No results for input(s): "TSH", "T4TOTAL", "FREET4", "T3FREE", "THYROIDAB" in the last 72 hours.  Anemia Panel: No results for input(s): "VITAMINB12", "FOLATE", "FERRITIN", "TIBC", "IRON", "RETICCTPCT" in the last 72 hours. Sepsis Labs: No results for input(s): "PROCALCITON", "LATICACIDVEN" in the last 168 hours.  Recent Results (from the past 240 hour(s))  Surgical pcr screen     Status: None   Collection Time: 03/15/23  3:36 PM   Specimen: Nasal Mucosa; Nasal Swab  Result Value Ref Range Status   MRSA, PCR NEGATIVE NEGATIVE Final   Staphylococcus aureus NEGATIVE NEGATIVE Final    Comment: (NOTE) The Xpert SA Assay (FDA approved for NASAL specimens in patients 41 years of age and older), is one component of a comprehensive surveillance program. It is not intended to diagnose infection nor to guide or monitor treatment. Performed at [facility], [address] [place], [place] [address]          Radiology Studies: DG CHEST PORT 1 VIEW  Result Date: 03/18/2023 CLINICAL DATA:  Hypoxia. EXAM: PORTABLE CHEST 1 VIEW COMPARISON:  March 14, 2023. FINDINGS: Stable cardiomediastinal silhouette. Right lung is clear. Elevated left hemidiaphragm is noted with mild left basilar subsegmental atelectasis. Bony thorax is unremarkable. IMPRESSION: Elevated left hemidiaphragm with mild left basilar subsegmental atelectasis. Electronically Signed   By: Lupita Raider M.D.   On: 03/18/2023 12:56        Scheduled Meds:  amLODipine  10 mg Oral Daily   carvedilol  6.25 mg Oral BID WC   Chlorhexidine Gluconate Cloth  6 each Topical Daily   docusate sodium  100 mg Oral BID   enoxaparin (LOVENOX) injection  40 mg Subcutaneous Q24H   feeding supplement  237 mL Oral Q24H   furosemide  40 mg Oral Daily   insulin aspart  0-9 Units Subcutaneous Q4H   losartan  100 mg Oral Daily   multivitamin with minerals  1 tablet Oral Daily   pantoprazole  40 mg Oral Daily   rosuvastatin  10 mg Oral Daily   Vitamin D (Ergocalciferol)  50,000 Units Oral Q7 days   Continuous Infusions:  sodium chloride 660 mL/hr at 03/16/23 1619   methocarbamol (ROBAXIN) IV       LOS: 5 days    Time spent: 35 minutes    Dayzha Pogosyan Hoover Brunette, DO [facility]  If 7PM-7AM, please contact night-coverage [email or web address] 03/19/2023, 2:23 PM

## 2023-03-19 NOTE — Anesthesia Postprocedure Evaluation (Signed)
Anesthesia Post Note  Patient: Kristina [name]  Procedure(s) Performed: OPEN REDUCTION INTERNAL FIXATION (ORIF) ACETABULAR FRACTURE STOPPA APPROACH (Left: Pelvis)     Patient location during evaluation: PACU Anesthesia Type: Spinal Level of consciousness: awake and alert Pain management: pain level controlled Vital Signs Assessment: post-procedure vital signs reviewed and stable Respiratory status: spontaneous breathing and respiratory function stable Cardiovascular status: blood pressure returned to baseline and stable Postop Assessment: spinal receding Anesthetic complications: no   No notable events documented.  Last Vitals:  Vitals:   03/19/23 0408 03/19/23 0745  BP: 104/71 139/75  Pulse: 92 92  Resp: 20   Temp: 36.8 C 36.7 C  SpO2: 94% 95%    Last Pain:  Vitals:   03/19/23 0745  TempSrc: Oral  PainSc:                  Kennieth Rad

## 2023-03-19 NOTE — Care Management Important Message (Signed)
Important Message  Patient Details  Name: JALYSA SWOPES MRN: [id number] Date of Birth: 1951-09-25   Medicare Important Message Given:  Yes     Sherilyn Banker 03/19/2023, 2:29 PM

## 2023-03-19 NOTE — Discharge Instructions (Signed)
[facility] Discharge Instructions   General Discharge Instructions  WEIGHT BEARING STATUS: Touchdown weightbearing left lower extremity  RANGE OF MOTION/ACTIVITY: Okay for unrestricted hip and knee motion as tolerated  Wound Care: You may remove your surgical dressing on postoperative day #3, (Monday, 03/19/2023). Incisions can be left open to air if there is no drainage. Once the incision is completely dry and without drainage, it may be left open to air out.  Showering may begin postoperative day #4, (Tuesday, 03/20/2023).  Clean incision gently with soap and water.  DVT/PE prophylaxis:  Eliquis 2.5 mg twice daily x 30 days  Diet: as you were eating previously.  Can use over the counter stool softeners and bowel preparations, such as Miralax, to help with bowel movements.  Narcotics can be constipating.  Be sure to drink plenty of fluids  PAIN MEDICATION USE AND EXPECTATIONS  You have likely been given narcotic medications to help control your pain.  After a traumatic event that results in an fracture (broken bone) with or without surgery, it is ok to use narcotic pain medications to help control one's pain.  We understand that everyone responds to pain differently and each individual patient will be evaluated on a regular basis for the continued need for narcotic medications. Ideally, narcotic medication use should last no more than 6-8 [place] (coinciding with fracture healing).   As a patient it is your responsibility as well to monitor narcotic medication use and report the amount and frequency you use these medications when you come to your office visit.   We would also advise that if you are using narcotic medications, you should take a dose prior to therapy to maximize you participation.  IF YOU ARE ON NARCOTIC MEDICATIONS IT IS NOT PERMISSIBLE TO OPERATE A MOTOR VEHICLE (MOTORCYCLE/CAR/TRUCK/MOPED) OR HEAVY MACHINERY DO NOT MIX NARCOTICS WITH OTHER CNS (CENTRAL NERVOUS  SYSTEM) DEPRESSANTS SUCH AS ALCOHOL   STOP SMOKING OR USING NICOTINE PRODUCTS!!!!  As discussed nicotine severely impairs your body's ability to heal surgical and traumatic wounds but also impairs bone healing.  Wounds and bone heal by forming microscopic blood vessels (angiogenesis) and nicotine is a vasoconstrictor (essentially, shrinks blood vessels).  Therefore, if vasoconstriction occurs to these microscopic blood vessels they essentially disappear and are unable to deliver necessary nutrients to the healing tissue.  This is one modifiable factor that you can do to dramatically increase your chances of healing your injury.    (This means no smoking, no nicotine gum, patches, etc)  DO NOT USE NONSTEROIDAL ANTI-INFLAMMATORY DRUGS (NSAID'S)  Using products such as Advil (ibuprofen), Aleve (naproxen), Motrin (ibuprofen) for additional pain control during fracture healing can delay and/or prevent the healing response.  If you would like to take over the counter (OTC) medication, Tylenol (acetaminophen) is ok.  However, some narcotic medications that are given for pain control contain acetaminophen as well. Therefore, you should not exceed more than 4000 mg of tylenol in a day if you do not have liver disease.  Also note that there are may OTC medicines, such as cold medicines and allergy medicines that my contain tylenol as well.  If you have any questions about medications and/or interactions please ask your doctor/PA or your pharmacist.      ICE AND ELEVATE INJURED/OPERATIVE EXTREMITY  Using ice and elevating the injured extremity above your heart can help with swelling and pain control.  Icing in a pulsatile fashion, such as 20 minutes on and 20 minutes off, can be  followed.    Do not place ice directly on skin. Make sure there is a barrier between to skin and the ice pack.    Using frozen items such as frozen peas works well as the conform nicely to the are that needs to be iced.  USE AN ACE WRAP  OR TED HOSE FOR SWELLING CONTROL  In addition to icing and elevation, Ace wraps or TED hose are used to help limit and resolve swelling.  It is recommended to use Ace wraps or TED hose until you are informed to stop.    When using Ace Wraps start the wrapping distally (farthest away from the body) and wrap proximally (closer to the body)   Example: If you had surgery on your leg or thing and you do not have a splint on, start the ace wrap at the toes and work your way up to the thigh        If you had surgery on your upper extremity and do not have a splint on, start the ace wrap at your fingers and work your way up to the upper arm   CALL THE OFFICE WITH ANY QUESTIONS OR CONCERNS: [phone]   VISIT OUR WEBSITE FOR ADDITIONAL INFORMATION: [email or web address]  Discharge Wound Care Instructions  Do NOT apply any ointments, solutions or lotions to pin sites or surgical wounds.  These prevent needed drainage and even though solutions like hydrogen peroxide kill bacteria, they also damage cells lining the pin sites that help fight infection.  Applying lotions or ointments can keep the wounds moist and can cause them to breakdown and open up as well. This can increase the risk for infection. When in doubt call the office.  If any drainage is noted, use foam dressing (Mepilex)   Once the incision is completely dry and without drainage, it may be left open to air out.  Showering may begin 36-48 hours later.  Cleaning gently with soap and water.   ===========================================================================  Information on my medicine - ELIQUIS (apixaban)  This medication education was reviewed with me or my healthcare representative as part of my discharge preparation.    Why was Eliquis prescribed for you? Eliquis was prescribed for you to reduce the risk of blood clots forming after orthopedic surgery.    What do You need to know about Eliquis? Take your Eliquis TWICE  DAILY - one tablet in the morning and one tablet in the evening with or without food.  It would be best to take the dose about the same time each day.  If you have difficulty swallowing the tablet whole please discuss with your pharmacist how to take the medication safely.  Take Eliquis exactly as prescribed by your doctor and DO NOT stop taking Eliquis without talking to the doctor who prescribed the medication.  Stopping without other medication to take the place of Eliquis may increase your risk of developing a clot.  After discharge, you should have regular check-up appointments with your healthcare provider that is prescribing your Eliquis.  What do you do if you miss a dose? If a dose of ELIQUIS is not taken at the scheduled time, take it as soon as possible on the same day and twice-daily administration should be resumed.  The dose should not be doubled to make up for a missed dose.  Do not take more than one tablet of ELIQUIS at the same time.  Important Safety Information A possible side effect of Eliquis is bleeding. You should call  your healthcare provider right away if you experience any of the following: Bleeding from an injury or your nose that does not stop. Unusual colored urine (red or dark brown) or unusual colored stools (red or black). Unusual bruising for unknown reasons. A serious fall or if you hit your head (even if there is no bleeding).  Some medicines may interact with Eliquis and might increase your risk of bleeding or clotting while on Eliquis. To help avoid this, consult your healthcare provider or pharmacist prior to using any new prescription or non-prescription medications, including herbals, vitamins, non-steroidal anti-inflammatory drugs (NSAIDs) and supplements.  This website has more information on Eliquis (apixaban): http://www.eliquis.com/eliquis/home

## 2023-03-20 ENCOUNTER — Encounter (HOSPITAL_COMMUNITY): Payer: Self-pay | Admitting: Student

## 2023-03-20 DIAGNOSIS — E119 Type 2 diabetes mellitus without complications: Secondary | ICD-10-CM | POA: Diagnosis not present

## 2023-03-20 DIAGNOSIS — E7849 Other hyperlipidemia: Secondary | ICD-10-CM | POA: Diagnosis not present

## 2023-03-20 DIAGNOSIS — Z9181 History of falling: Secondary | ICD-10-CM | POA: Diagnosis not present

## 2023-03-20 DIAGNOSIS — R41841 Cognitive communication deficit: Secondary | ICD-10-CM | POA: Diagnosis not present

## 2023-03-20 DIAGNOSIS — R419 Unspecified symptoms and signs involving cognitive functions and awareness: Secondary | ICD-10-CM | POA: Diagnosis not present

## 2023-03-20 DIAGNOSIS — M62522 Muscle wasting and atrophy, not elsewhere classified, left upper arm: Secondary | ICD-10-CM | POA: Diagnosis not present

## 2023-03-20 DIAGNOSIS — S32402A Unspecified fracture of left acetabulum, initial encounter for closed fracture: Secondary | ICD-10-CM | POA: Diagnosis not present

## 2023-03-20 DIAGNOSIS — M62521 Muscle wasting and atrophy, not elsewhere classified, right upper arm: Secondary | ICD-10-CM | POA: Diagnosis not present

## 2023-03-20 DIAGNOSIS — R2689 Other abnormalities of gait and mobility: Secondary | ICD-10-CM | POA: Diagnosis not present

## 2023-03-20 DIAGNOSIS — J9611 Chronic respiratory failure with hypoxia: Secondary | ICD-10-CM | POA: Diagnosis not present

## 2023-03-20 DIAGNOSIS — I2510 Atherosclerotic heart disease of native coronary artery without angina pectoris: Secondary | ICD-10-CM | POA: Diagnosis not present

## 2023-03-20 DIAGNOSIS — M62562 Muscle wasting and atrophy, not elsewhere classified, left lower leg: Secondary | ICD-10-CM | POA: Diagnosis not present

## 2023-03-20 DIAGNOSIS — I10 Essential (primary) hypertension: Secondary | ICD-10-CM | POA: Diagnosis not present

## 2023-03-20 DIAGNOSIS — G4730 Sleep apnea, unspecified: Secondary | ICD-10-CM | POA: Diagnosis not present

## 2023-03-20 DIAGNOSIS — E1169 Type 2 diabetes mellitus with other specified complication: Secondary | ICD-10-CM | POA: Diagnosis not present

## 2023-03-20 DIAGNOSIS — Z743 Need for continuous supervision: Secondary | ICD-10-CM | POA: Diagnosis not present

## 2023-03-20 DIAGNOSIS — I5032 Chronic diastolic (congestive) heart failure: Secondary | ICD-10-CM | POA: Diagnosis not present

## 2023-03-20 DIAGNOSIS — S32402D Unspecified fracture of left acetabulum, subsequent encounter for fracture with routine healing: Secondary | ICD-10-CM | POA: Diagnosis not present

## 2023-03-20 DIAGNOSIS — R531 Weakness: Secondary | ICD-10-CM | POA: Diagnosis not present

## 2023-03-20 LAB — BASIC METABOLIC PANEL
Anion gap: 7 (ref 5–15)
BUN: 18 mg/dL (ref 8–23)
CO2: 37 mmol/L — ABNORMAL HIGH (ref 22–32)
Calcium: 7.8 mg/dL — ABNORMAL LOW (ref 8.9–10.3)
Chloride: 95 mmol/L — ABNORMAL LOW (ref 98–111)
Creatinine, Ser: 0.59 mg/dL (ref 0.44–1.00)
GFR, Estimated: >60 mL/min (ref >60)
Glucose, Bld: 122 mg/dL — ABNORMAL HIGH (ref 70–99)
Potassium: 3.8 mmol/L (ref 3.5–5.1)
Sodium: 139 mmol/L (ref 135–145)

## 2023-03-20 LAB — CBC
HCT: 31.8 % — ABNORMAL LOW (ref 36.0–46.0)
Hemoglobin: 9.6 g/dL — ABNORMAL LOW (ref 12.0–15.0)
MCH: 28.8 pg (ref 26.0–34.0)
MCHC: 30.2 g/dL (ref 30.0–36.0)
MCV: 95.5 fL (ref 80.0–100.0)
Platelets: 204 10*3/uL (ref 150–400)
RBC: 3.33 MIL/uL — ABNORMAL LOW (ref 3.87–5.11)
RDW: 14.2 % (ref 11.5–15.5)
WBC: 9.6 10*3/uL (ref 4.0–10.5)
nRBC: 0 % (ref 0.0–0.2)

## 2023-03-20 LAB — GLUCOSE, CAPILLARY
Glucose-Capillary: 117 mg/dL — ABNORMAL HIGH (ref 70–99)
Glucose-Capillary: 127 mg/dL — ABNORMAL HIGH (ref 70–99)
Glucose-Capillary: 127 mg/dL — ABNORMAL HIGH (ref 70–99)
Glucose-Capillary: 130 mg/dL — ABNORMAL HIGH (ref 70–99)

## 2023-03-20 LAB — MAGNESIUM: Magnesium: 2.2 mg/dL (ref 1.7–2.4)

## 2023-03-20 NOTE — TOC Progression Note (Signed)
Transition of Care [facility]) - Progression Note    Patient Details  Name: Kristina [name] MRN: [id number] Date of Birth: 1951-04-15  Transition of [facility]) CM/SW Contact  Lorri Frederick, LCSW Phone Number: 03/20/2023, 9:49 AM  Clinical Narrative:   CSW confirmed with [facility] that they can receive pt, Berkley Harvey is confirmed.  MD notified.    Expected Discharge Plan: Skilled Nursing Facility Barriers to Discharge: Continued Medical Work up  Expected Discharge Plan and Services In-house Referral: Clinical Social Work   Post Acute Care Choice: Skilled Nursing Facility Living arrangements for the past 2 months: Single Family Home Expected Discharge Date: 03/20/23                                     Social Determinants of Health (SDOH) Interventions SDOH Screenings   Food Insecurity: No Food Insecurity (03/14/2023)  Housing: Patient Declined (03/15/2023)  Transportation Needs: No Transportation Needs (03/14/2023)  Utilities: Not At Risk (03/14/2023)  Tobacco Use: Low Risk  (03/16/2023)    Readmission Risk Interventions     No data to display

## 2023-03-20 NOTE — TOC Transition Note (Signed)
Transition of Care [facility]) - CM/SW Discharge Note   Patient Details  Name: Kristina [name] MRN: [id number] Date of Birth: [birth year]  [facility]) CM/SW Contact:  Lorri Frederick, LCSW Phone Number: 03/20/2023, 9:54 AM   Clinical Narrative:   Pt discharging to [facility].  RN call report to [phone].    Final next level of care: Skilled Nursing Facility Barriers to Discharge: Barriers Resolved   Patient Goals and CMS Choice CMS Medicare.gov Compare Post Acute Care list provided to:: Other (Comment Required) (Kathey-pt's daughter) Choice offered to / list presented to : Adult Children  Discharge Placement                Patient chooses bed at:  [facility]) Patient to be transferred to facility by: [facility] Name of family member notified: left messages with daughter Olegario Messier, husband Lyda Jester Patient and family notified of of transfer: 03/20/23  Discharge Plan and Services Additional resources added to the After Visit Summary for   In-house Referral: Clinical Social Work   Post Acute Care Choice: Skilled Nursing Facility                               Social Determinants of Health (SDOH) Interventions SDOH Screenings   Food Insecurity: No Food Insecurity (03/14/2023)  Housing: Patient Declined (03/15/2023)  Transportation Needs: No Transportation Needs (03/14/2023)  Utilities: Not At Risk (03/14/2023)  Tobacco Use: Low Risk  (03/16/2023)     Readmission Risk Interventions     No data to display

## 2023-03-20 NOTE — Discharge Summary (Signed)
Physician Discharge Summary  Kristina [name] ZOX:[id number] DOB: 1950-10-01 DOA: 03/14/2023  PCP: Benita Stabile, MD  Admit date: 03/14/2023  Discharge date: 03/20/2023  Admitted From:Home  Disposition:  SNF  Recommendations for Outpatient Follow-up:  Follow up with PCP in 1-2 [place] Follow-up with Dr. Jena Gauss with orthopedics in 2 [place] Continue Eliquis as prescribed for DVT prophylaxis Pain management provided as below Vitamin D supplementation as prescribed Continue other home medications  Home Health: None  Equipment/Devices: None  Discharge Condition:Stable  CODE STATUS: Full  Diet recommendation: Heart Healthy/carb modified  Brief/Interim Summary: Kristina [name] is a 72 y.o. 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  PT/OT evaluation recommending SNF.  She is now in stable condition for discharge and has a SNF bed available.  No other acute events or concerns noted throughout the course of the stay.  Discharge Diagnoses:  Principal Problem:   Acetabular fracture (HCC) Active Problems:   Chronic heart failure with preserved ejection fraction (HFpEF) (HCC)   Chronic respiratory failure with hypoxia (HCC)   Sleep apnea  Principal discharge diagnosis: Acetabular fracture status post ORIF 6/21.  Discharge Instructions  Discharge Instructions     Ambulatory referral to Orthopedics   Complete by: As directed    Diet - low sodium heart healthy   Complete by: As directed    Diet - low sodium heart healthy   Complete by: As directed    Increase activity slowly   Complete by: As directed    Increase activity slowly   Complete by: As directed       Allergies as of 03/20/2023   No Known Allergies      Medication List     STOP taking these medications    aspirin EC 81 MG tablet       TAKE these medications    acetaminophen  325 MG tablet Commonly known as: TYLENOL Take 2 tablets (650 mg total) by mouth every 6 (six) hours as needed for mild pain (or Fever >/= 101).   albuterol (2.5 MG/3ML) 0.083% nebulizer solution Commonly known as: PROVENTIL Take 3 mLs (2.5 mg total) by nebulization every 6 (six) hours as needed for wheezing or shortness of breath.   amLODipine 10 MG tablet Commonly known as: NORVASC Take 1 tablet (10 mg total) by mouth daily.   apixaban 2.5 MG Tabs tablet Commonly known as: Eliquis Take 1 tablet (2.5 mg total) by mouth 2 (two) times daily.   carvedilol 6.25 MG tablet Commonly known as: COREG TAKE 1 TABLET TWICE A DAY   docusate sodium 100 MG capsule Commonly known as: COLACE Take 1 capsule (100 mg total) by mouth 2 (two) times daily.   feeding supplement Liqd Take 237 mLs by mouth daily.   furosemide 40 MG tablet Commonly known as: LASIX Take 1 tablet (40 mg total) by mouth daily.   HYDROcodone-acetaminophen 5-325 MG tablet Commonly known as: NORCO/VICODIN Take 1-2 tablets by mouth every 6 (six) hours as needed for moderate pain or severe pain.   losartan 100 MG tablet Commonly known as: COZAAR Take 1 tablet (100 mg total) by mouth daily.   methocarbamol 500 MG tablet Commonly known as: ROBAXIN Take 1 tablet (500 mg total) by mouth every 6 (six) hours as needed for muscle spasms.   multivitamin tablet Take 1 tablet by mouth daily.  omeprazole 40 MG capsule Commonly known as: PRILOSEC Take 40 mg by mouth daily.   rosuvastatin 10 MG tablet Commonly known as: CRESTOR Take 10 mg by mouth daily.   Vitamin D 50 MCG (2000 UT) Caps Take 1 capsule (2,000 Units total) by mouth daily.        Follow-up Information     Benita Stabile, MD. Schedule an appointment as soon as possible for a visit.   Specialty: Internal Medicine Contact information: [address] [place] [facility] [address] [phone]         Haddix, Gillie Manners, MD. Schedule an appointment as soon  as possible for a visit in 2 week(s).   Specialty: Orthopedic Surgery Why: for wound check and repeat x-rays Contact information: [address] [place] [address] [phone]                No Known Allergies  Consultations: Orthopedics   Procedures/Studies: DG CHEST PORT 1 VIEW  Result Date: 03/18/2023 CLINICAL DATA:  Hypoxia. EXAM: PORTABLE CHEST 1 VIEW COMPARISON:  March 14, 2023. FINDINGS: Stable cardiomediastinal silhouette. Right lung is clear. Elevated left hemidiaphragm is noted with mild left basilar subsegmental atelectasis. Bony thorax is unremarkable. IMPRESSION: Elevated left hemidiaphragm with mild left basilar subsegmental atelectasis. Electronically Signed   By: Lupita Raider M.D.   On: 03/18/2023 12:56   DG Pelvis Comp Min 3V  Result Date: 03/16/2023 CLINICAL DATA:  Left acetabular fracture. EXAM: JUDET PELVIS - 3+ VIEW COMPARISON:  Fluoroscopic images of same day. FINDINGS: Status post surgical internal fixation of left acetabular fracture with good alignment of fracture components. Mildly displaced left inferior pubic ramus fracture is also noted. IMPRESSION: Status post surgical internal fixation of left acetabular fracture. Continued presence of mildly displaced left inferior pubic ramus fracture. Electronically Signed   By: Lupita Raider M.D.   On: 03/16/2023 16:18   DG Pelvis Comp Min 3V  Result Date: 03/16/2023 CLINICAL DATA:  Fluoroscopy assistance for internal fixation of fracture of left hemipelvis EXAM: JUDET PELVIS - 3+ VIEW COMPARISON:  03/14/2023 FINDINGS: Fluoroscopic images show internal fixation of comminuted fractures in left iliac bone. Fluoroscopic time 29.9 seconds. Radiation dose 9.08 mGy. IMPRESSION: Fluoroscopic assistance was provided for internal fixation of comminuted fracture of left iliac bone. Electronically Signed   By: Ernie Avena M.D.   On: 03/16/2023 14:12   DG C-Arm 1-60 Min-No Report  Result Date:  03/16/2023 Fluoroscopy was utilized by the requesting physician.  No radiographic interpretation.   DG C-Arm 1-60 Min-No Report  Result Date: 03/16/2023 Fluoroscopy was utilized by the requesting physician.  No radiographic interpretation.   ECHOCARDIOGRAM COMPLETE  Result Date: 03/15/2023    ECHOCARDIOGRAM REPORT   Patient Name:   Kristina [name] Date of Exam: 03/15/2023 Medical Rec #:  [id number]       Height:       58.0 in Accession #:    [id number]      Weight:       168.9 lb Date of Birth:  12-05-1950       BSA:          1.695 m Patient Age:    72 years        BP:           160/89 mmHg Patient Gender: F               HR:           89 bpm. Exam  Location:  Inpatient Procedure: 2D Echo, Cardiac Doppler and Color Doppler Indications:    Preoperative evaluation  History:        Patient has prior history of Echocardiogram examinations, most                 recent 04/06/2021. CHF; Risk Factors:Hypertension, Diabetes and                 Sleep Apnea.  Sonographer:    Lucendia Herrlich Referring Phys: [id number] ANASTASSIA DOUTOVA IMPRESSIONS  1. Left ventricular ejection fraction, by estimation, is 60 to 65%. The left ventricle has normal function. The left ventricle has no regional wall motion abnormalities. There is mild left ventricular hypertrophy. Left ventricular diastolic parameters are consistent with Grade I diastolic dysfunction (impaired relaxation).  2. Right ventricular systolic function is normal. The right ventricular size is normal. Tricuspid regurgitation signal is inadequate for assessing PA pressure.  3. The mitral valve is grossly normal. No evidence of mitral valve regurgitation.  4. There is mild calcification of the aortic valve. Aortic valve regurgitation is not visualized.  5. The inferior vena cava is normal in size with greater than 50% respiratory variability, suggesting right atrial pressure of 3 mmHg. FINDINGS  Left Ventricle: Left ventricular ejection fraction, by estimation, is 60 to 65%.  The left ventricle has normal function. The left ventricle has no regional wall motion abnormalities. The left ventricular internal cavity size was normal in size. There is  mild left ventricular hypertrophy. Left ventricular diastolic parameters are consistent with Grade I diastolic dysfunction (impaired relaxation). Right Ventricle: The right ventricular size is normal. Right ventricular systolic function is normal. Tricuspid regurgitation signal is inadequate for assessing PA pressure. Left Atrium: Left atrial size was normal in size. Right Atrium: Right atrial size was normal in size. Pericardium: Trivial pericardial effusion is present. Mitral Valve: The mitral valve is grossly normal. No evidence of mitral valve regurgitation. Tricuspid Valve: Tricuspid valve regurgitation is not demonstrated. Aortic Valve: There is mild calcification of the aortic valve. Aortic valve regurgitation is not visualized. Aortic valve peak gradient measures 6.7 mmHg. Pulmonic Valve: Pulmonic valve regurgitation is not visualized. Aorta: The aortic root and ascending aorta are structurally normal, with no evidence of dilitation. Venous: The inferior vena cava is normal in size with greater than 50% respiratory variability, suggesting right atrial pressure of 3 mmHg. IAS/Shunts: The interatrial septum was not well visualized.  LEFT VENTRICLE PLAX 2D LVIDd:         4.50 cm   Diastology LVIDs:         3.20 cm   LV e' medial:    0.08 cm/s LV PW:         0.60 cm   LV E/e' medial:  6.9 LV IVS:        1.20 cm   LV e' lateral:   0.06 cm/s LVOT diam:     2.00 cm   LV E/e' lateral: 8.8 LV SV:         51 LV SV Index:   30 LVOT Area:     3.14 cm  RIGHT VENTRICLE             IVC RV S prime:     12.00 cm/s  IVC diam: 1.70 cm TAPSE (M-mode): 1.7 cm LEFT ATRIUM             Index        RIGHT ATRIUM  Index LA diam:        3.70 cm 2.18 cm/m   RA Area:     10.20 cm LA Vol (A2C):   57.2 ml 33.75 ml/m  RA Volume:   20.50 ml  12.10 ml/m LA  Vol (A4C):   54.8 ml 32.33 ml/m LA Biplane Vol: 58.2 ml 34.34 ml/m  AORTIC VALVE AV Area (Vmax): 2.26 cm AV Vmax:        129.00 cm/s AV Peak Grad:   6.7 mmHg LVOT Vmax:      92.90 cm/s LVOT Vmean:     59.300 cm/s LVOT VTI:       0.162 m  AORTA Ao Root diam: 2.90 cm Ao Asc diam:  3.30 cm MITRAL VALVE MV Area (PHT): 3.72 cm    SHUNTS MV Decel Time: 204 msec    Systemic VTI:  0.16 m MR Peak grad: 42.8 mmHg    Systemic Diam: 2.00 cm MR Vmax:      327.00 cm/s MV E velocity: 0.55 cm/s MV A velocity: 99.00 cm/s MV E/A ratio:  0.01 Carolan Clines Electronically signed by Carolan Clines Signature Date/Time: 03/15/2023/10:46:43 AM    Final    DG Chest Port 1 View  Result Date: 03/14/2023 CLINICAL DATA:  Preop evaluation EXAM: PORTABLE CHEST 1 VIEW COMPARISON:  04/09/2021 FINDINGS: Transverse diameter of heart is increased. Low lung volumes are noted. Increased interstitial markings are seen in left lower lung field with no interval change. Left hemidiaphragm is elevated. There are no new infiltrates or signs of pulmonary edema. There is no pleural effusion or pneumothorax. IMPRESSION: Linear densities in left lower lung field may suggest scarring or subsegmental atelectasis. Low lung volumes. Left hemidiaphragm is elevated. There are no new infiltrates or signs of pulmonary edema. Electronically Signed   By: Ernie Avena M.D.   On: 03/14/2023 19:28   CT PELVIS WO CONTRAST  Result Date: 03/14/2023 CLINICAL DATA:  Hip fracture EXAM: CT PELVIS WITHOUT CONTRAST TECHNIQUE: Multidetector CT imaging of the pelvis was performed following the standard protocol without intravenous contrast. RADIATION DOSE REDUCTION: This exam was performed according to the departmental dose-optimization program which includes automated exposure control, adjustment of the mA and/or kV according to patient size and/or use of iterative reconstruction technique. COMPARISON:  X-ray 03/14/2023 and older FINDINGS: Urinary Tract: Preserved contours  of the urinary bladder. Bladder is mildly distended. Presumed cysts involving the kidneys of the edge of the imaging field but incompletely evaluated. Please correlate with prior imaging of the area or if needed ultrasound. Bowel: No oral contrast. Stool in the rectum and sigmoid colon. Significant diverticula of the sigmoid colon. Normal appendix in the right lower quadrant. The visualized small bowel is nondilated. Vascular/Lymphatic: Mild vascular calcifications along the aorta and iliac vessels. No specific abnormal lymph node enlargement identified in the visualized pelvis. Reproductive: Uterus is present. There is a lobular structure in the anterior left adnexa which appears separate from the left ovary. Paraovarian lesion or exophytic pedunculated fibroid is possible. Please correlate for any known history or dedicated workup when appropriate. The structure on series 3, image 24 measures 4.1 by 3.2 cm. Other: No free fluid in the pelvis. There is some hematoma along the extraperitoneal space on the left side in the low pelvis. Musculoskeletal: Acute appearing fracture involving the left inferior pubic ramus which is comminuted. There also fracture lines along the anterior aspect of the left acetabulum. Comminuted fracture fragments extend along the medial portion of the acetabulum, posterior and superior.  Fracture lines extend along the left side of the iliac bone inferiorly. Please correlate with the time course of injury. Mild degenerative changes of the visualized portions of the spine and sacroiliac joints. Sclerosis along the pubic symphysis. Focal asymmetric thickening of the left iliacus muscle and obturator internus. Possible associated hematoma. IMPRESSION: Comminuted fracture identified involving the left acetabulum with fracture lines diffusely along all aspects of the acetabulum. Extension of fracture lines along the inferior left iliac bone. Separate inferior pubic ramus fracture on the left.  Surrounding associated hematoma in the extraperitoneal space and involving the musculature of the iliacus muscle and obturator internus. 4.1 cm soft tissue nodule in the left adnexa anterior to the left side of the uterus and ovary. Possibilities would include a paraovarian lesion versus an exophytic uterine fibroid. Please correlate for any known history or dedicated workup when appropriate Electronically Signed   By: Karen Kays M.D.   On: 03/14/2023 16:07   DG Hip Unilat With Pelvis 2-3 Views Left  Result Date: 03/14/2023 CLINICAL DATA:  Trauma, fall EXAM: DG HIP (WITH OR WITHOUT PELVIS) 2-3V LEFT COMPARISON:  07/18/2022 FINDINGS: There is comminuted displaced fracture in left iliac bone along the medial aspect of left acetabulum. There is 8 mm offset in alignment of fracture fragments in the medial aspect of left iliac bone. There is radiolucent line in the lateral cortex of left iliac bone superior to the acetabulum. There is fracture in left inferior pubic ramus with 5 mm offset in alignment. As far as seen, no fractures are noted in proximal left femur. IMPRESSION: There is no demonstrable fracture in proximal left femur.There is recent comminuted displaced fracture in left iliac bone involving the medial aspect of left acetabulum. Fracture line is involving the lateral cortical margin of left iliac bone superior to the acetabulum. There is recent displaced fracture in the left inferior pubic ramus. Follow-up CT may be considered. Electronically Signed   By: Ernie Avena M.D.   On: 03/14/2023 14:58     Discharge Exam: Vitals:   03/20/23 0413 03/20/23 0811  BP: (!) 140/67 (!) 149/71  Pulse: 84 86  Resp: 18   Temp: 98.2 F (36.8 C) 98.4 F (36.9 C)  SpO2: 95% 94%   Vitals:   03/19/23 1533 03/19/23 2053 03/20/23 0413 03/20/23 0811  BP: 115/67 137/67 (!) 140/67 (!) 149/71  Pulse: 94 87 84 86  Resp:  18 18   Temp: 98.1 F (36.7 C) 98.2 F (36.8 C) 98.2 F (36.8 C) 98.4 F (36.9  C)  TempSrc: Oral Oral Oral Oral  SpO2: 95% 95% 95% 94%  Weight:      Height:        General: Pt is alert, awake, not in acute distress Cardiovascular: RRR, S1/S2 +, no rubs, no gallops Respiratory: CTA bilaterally, no wheezing, no rhonchi, nasal cannula oxygen Abdominal: Soft, NT, ND, bowel sounds + Extremities: no edema, no cyanosis    The results of significant diagnostics from this hospitalization (including imaging, microbiology, ancillary and laboratory) are listed below for reference.     Microbiology: Recent Results (from the past 240 hour(s))  Surgical pcr screen     Status: None   Collection Time: 03/15/23  3:36 PM   Specimen: Nasal Mucosa; Nasal Swab  Result Value Ref Range Status   MRSA, PCR NEGATIVE NEGATIVE Final   Staphylococcus aureus NEGATIVE NEGATIVE Final    Comment: (NOTE) The Xpert SA Assay (FDA approved for NASAL specimens in patients 36 years of age  and older), is one component of a comprehensive surveillance program. It is not intended to diagnose infection nor to guide or monitor treatment. Performed at [facility], [address], [place], [place] [address]      Labs: BNP (last 3 results) No results for input(s): "BNP" in the last 8760 hours. Basic Metabolic Panel: Recent Labs  Lab 03/14/23 2054 03/16/23 0651 03/16/23 1632 03/17/23 0351 03/18/23 0135 03/19/23 0450 03/20/23 0141  NA  --  139  --  139 136 140 139  K  --  3.7  --  4.5 4.3 3.9 3.8  CL  --  95*  --  97* 96* 97* 95*  CO2  --  33*  --  37* 35* 35* 37*  GLUCOSE  --  154*  --  134* 141* 134* 122*  BUN  --  17  --  19 29* 22 18  CREATININE  --  0.63 0.52 0.72 0.66 0.51 0.59  CALCIUM  --  8.9  --  8.3* 8.4* 8.3* 7.8*  MG 1.9 2.3  --  2.4 2.6* 2.2 2.2  PHOS 3.2  --   --   --   --   --   --    Liver Function Tests: Recent Labs  Lab 03/14/23 2054  AST 30  ALT 17  ALKPHOS 69  BILITOT 0.7  PROT 6.6  ALBUMIN 3.6   No results for input(s): "LIPASE", "AMYLASE" in  the last 168 hours. No results for input(s): "AMMONIA" in the last 168 hours. CBC: Recent Labs  Lab 03/14/23 1340 03/16/23 0651 03/16/23 1632 03/17/23 0351 03/18/23 0135 03/19/23 0450 03/20/23 0141  WBC 8.9   < > 14.0* 12.5* 12.4* 9.0 9.6  NEUTROABS 6.6  --   --   --   --   --   --   HGB 15.2*   < > 12.7 11.2* 10.1* 10.2* 9.6*  HCT 49.2*   < > 42.1 38.0 33.7* 33.8* 31.8*  MCV 92.3   < > 95.9 96.4 97.7 96.8 95.5  PLT 221   < > 179 187 174 174 204   < > = values in this interval not displayed.   Cardiac Enzymes: Recent Labs  Lab 03/14/23 2054  CKTOTAL 95   BNP: Invalid input(s): "POCBNP" CBG: Recent Labs  Lab 03/19/23 1641 03/19/23 2045 03/19/23 2359 03/20/23 0411 03/20/23 0812  GLUCAP 132* 129* 127* 130* 117*   D-Dimer No results for input(s): "DDIMER" in the last 72 hours. Hgb A1c No results for input(s): "HGBA1C" in the last 72 hours. Lipid Profile No results for input(s): "CHOL", "HDL", "LDLCALC", "TRIG", "CHOLHDL", "LDLDIRECT" in the last 72 hours. Thyroid function studies No results for input(s): "TSH", "T4TOTAL", "T3FREE", "THYROIDAB" in the last 72 hours.  Invalid input(s): "FREET3" Anemia work up No results for input(s): "VITAMINB12", "FOLATE", "FERRITIN", "TIBC", "IRON", "RETICCTPCT" in the last 72 hours. Urinalysis    Component Value Date/Time   COLORURINE STRAW (A) 04/05/2021 2321   APPEARANCEUR CLEAR 04/05/2021 2321   LABSPEC >1.046 (H) 04/05/2021 2321   PHURINE 7.0 04/05/2021 2321   GLUCOSEU NEGATIVE 04/05/2021 2321   HGBUR NEGATIVE 04/05/2021 2321   BILIRUBINUR NEGATIVE 04/05/2021 2321   KETONESUR NEGATIVE 04/05/2021 2321   PROTEINUR NEGATIVE 04/05/2021 2321   NITRITE NEGATIVE 04/05/2021 2321   LEUKOCYTESUR TRACE (A) 04/05/2021 2321   Sepsis Labs Recent Labs  Lab 03/17/23 0351 03/18/23 0135 03/19/23 0450 03/20/23 0141  WBC 12.5* 12.4* 9.0 9.6   Microbiology Recent Results (from  the past 240 hour(s))  Surgical pcr screen      Status: None   Collection Time: 03/15/23  3:36 PM   Specimen: Nasal Mucosa; Nasal Swab  Result Value Ref Range Status   MRSA, PCR NEGATIVE NEGATIVE Final   Staphylococcus aureus NEGATIVE NEGATIVE Final    Comment: (NOTE) The Xpert SA Assay (FDA approved for NASAL specimens in patients 46 years of age and older), is one component of a comprehensive surveillance program. It is not intended to diagnose infection nor to guide or monitor treatment. Performed at [facility], [address], [place], [place] [address]      Time coordinating discharge: 35 minutes  SIGNED:   Erick Blinks, DO [facility] 03/20/2023, 8:33 AM  If 7PM-7AM, please contact night-coverage [email or web address]

## 2023-03-20 NOTE — Progress Notes (Signed)
Pt being d/c, VSS, IV removed, PTAR called, Education complete.   Balinda Quails, RN 03/20/2023 9:55 AM

## 2023-04-02 DIAGNOSIS — S32402D Unspecified fracture of left acetabulum, subsequent encounter for fracture with routine healing: Secondary | ICD-10-CM | POA: Diagnosis not present

## 2023-04-03 ENCOUNTER — Other Ambulatory Visit: Payer: Self-pay | Admitting: *Deleted

## 2023-04-03 NOTE — Patient Outreach (Signed)
Per [facility] Mr. Meek resides in [facility] skilled nursing facility. Screening for potential [facility] care coordination services as benefit of health plan and Primary Care Provider.   Update received from [place], [facility] social worker. Mrs. Pham lives at home with her spouse. She will likely transition home on or before copays begin 04/09/23. She will have [facility] home health. Has wheelchair at home and home is wheelchair accessible.   Will plan outreach to discuss [facility] care coordination services.   Raiford Noble, MSN, RN,BSN [facility] Post Acute Care Coordinator [phone] (Direct dial)

## 2023-04-05 ENCOUNTER — Ambulatory Visit: Payer: Medicare HMO | Admitting: Cardiology

## 2023-04-06 ENCOUNTER — Other Ambulatory Visit: Payer: Self-pay | Admitting: *Deleted

## 2023-04-06 NOTE — Patient Outreach (Signed)
Post-Acute Care Coordinator follow up. Kristina [name] resides in [facility] skilled nursing facility. Screening for potential care coordination services as benefit of health plan and PCP.  Lolita Cram [place] social worker previously reported Kristina [name] will return home with spouse. Will have [facility] home health.   Telephone call made to Kristina [name] [phone]. Patient identifiers confirmed. Kristina [name] reports she is going home tomorrow. Writer explained care coordination services. Kristina [name] is agreeable. Confirmed best contact number as [phone].  Will refer for care coordination upon SNF discharge.   Raiford Noble, MSN, RN,BSN [facility] Post Acute Care Coordinator [phone] (Direct dial)

## 2023-04-09 ENCOUNTER — Other Ambulatory Visit: Payer: Self-pay | Admitting: *Deleted

## 2023-04-09 ENCOUNTER — Telehealth: Payer: Self-pay | Admitting: *Deleted

## 2023-04-09 DIAGNOSIS — I5042 Chronic combined systolic (congestive) and diastolic (congestive) heart failure: Secondary | ICD-10-CM

## 2023-04-09 NOTE — Patient Outreach (Signed)
Per [facility] Mrs. Combes discharged from [facility] skilled nursing facility on 04/07/23. Screening for potential [facility] care coordination services as benefit of health plan and  Primary Care Provider.  Previously spoke with Mrs. Feldner who was agreeable for care coordination services. Will have [facility] home health. Lives with spouse.   Will refer for RN care coordination services.   Raiford Noble, MSN, RN,BSN [facility] Post Acute Care Coordinator [phone] (Direct dial)

## 2023-04-09 NOTE — Progress Notes (Unsigned)
  Care Coordination  Outreach Note  04/09/2023 Name: JESSYCA SLOAN MRN: [id number] DOB: 30-Jul-1951   Care Coordination Outreach Attempts: An unsuccessful telephone outreach was attempted today to offer the patient information about available care coordination services.  Follow Up Plan:  Additional outreach attempts will be made to offer the patient care coordination information and services.   Encounter Outcome:  No Answer  Burman Nieves, CCMA Care Coordination Care Guide Direct Dial: [phone]

## 2023-04-10 ENCOUNTER — Encounter (HOSPITAL_COMMUNITY): Payer: Self-pay | Admitting: Internal Medicine

## 2023-04-10 DIAGNOSIS — Z7901 Long term (current) use of anticoagulants: Secondary | ICD-10-CM | POA: Diagnosis not present

## 2023-04-10 DIAGNOSIS — E785 Hyperlipidemia, unspecified: Secondary | ICD-10-CM | POA: Diagnosis not present

## 2023-04-10 DIAGNOSIS — K219 Gastro-esophageal reflux disease without esophagitis: Secondary | ICD-10-CM | POA: Diagnosis not present

## 2023-04-10 DIAGNOSIS — I110 Hypertensive heart disease with heart failure: Secondary | ICD-10-CM | POA: Diagnosis not present

## 2023-04-10 DIAGNOSIS — M6281 Muscle weakness (generalized): Secondary | ICD-10-CM | POA: Diagnosis not present

## 2023-04-10 DIAGNOSIS — Z9181 History of falling: Secondary | ICD-10-CM | POA: Diagnosis not present

## 2023-04-10 DIAGNOSIS — G4730 Sleep apnea, unspecified: Secondary | ICD-10-CM | POA: Diagnosis not present

## 2023-04-10 DIAGNOSIS — I509 Heart failure, unspecified: Secondary | ICD-10-CM | POA: Diagnosis not present

## 2023-04-10 DIAGNOSIS — S32402D Unspecified fracture of left acetabulum, subsequent encounter for fracture with routine healing: Secondary | ICD-10-CM | POA: Diagnosis not present

## 2023-04-10 DIAGNOSIS — E119 Type 2 diabetes mellitus without complications: Secondary | ICD-10-CM | POA: Diagnosis not present

## 2023-04-10 DIAGNOSIS — I2510 Atherosclerotic heart disease of native coronary artery without angina pectoris: Secondary | ICD-10-CM | POA: Diagnosis not present

## 2023-04-10 DIAGNOSIS — J9611 Chronic respiratory failure with hypoxia: Secondary | ICD-10-CM | POA: Diagnosis not present

## 2023-04-10 DIAGNOSIS — I5032 Chronic diastolic (congestive) heart failure: Secondary | ICD-10-CM | POA: Diagnosis not present

## 2023-04-10 NOTE — Progress Notes (Signed)
  Care Coordination   Note   04/10/2023 Name: Kristina [name] MRN: [id number] DOB: 1950/12/22  [name] is a 72 y.o. year old female who sees Margo Aye, Kathleene Hazel, MD for primary care. I reached out to [employer] by phone today to offer care coordination services.  [name] was given information about Care Coordination services today including:   The Care Coordination services include support from the care team which includes your Nurse Coordinator, Clinical Social Worker, or Pharmacist.  The Care Coordination team is here to help remove barriers to the health concerns and goals most important to you. Care Coordination services are voluntary, and the patient may decline or stop services at any time by request to their care team member.   Care Coordination Consent Status: Patient agreed to services and verbal consent obtained.   Follow up plan:  Telephone appointment with care coordination team member scheduled for:  04/20/2023  Encounter Outcome:  Pt. Scheduled from referral   Burman Nieves, [facility] Care Coordination Care Guide Direct Dial: [phone]

## 2023-04-11 ENCOUNTER — Other Ambulatory Visit: Payer: Self-pay

## 2023-04-11 DIAGNOSIS — R2689 Other abnormalities of gait and mobility: Secondary | ICD-10-CM | POA: Diagnosis not present

## 2023-04-11 DIAGNOSIS — S32402D Unspecified fracture of left acetabulum, subsequent encounter for fracture with routine healing: Secondary | ICD-10-CM | POA: Diagnosis not present

## 2023-04-12 DIAGNOSIS — S32402D Unspecified fracture of left acetabulum, subsequent encounter for fracture with routine healing: Secondary | ICD-10-CM | POA: Diagnosis not present

## 2023-04-12 DIAGNOSIS — R2689 Other abnormalities of gait and mobility: Secondary | ICD-10-CM | POA: Diagnosis not present

## 2023-04-20 ENCOUNTER — Ambulatory Visit: Payer: Self-pay | Admitting: *Deleted

## 2023-04-20 NOTE — Patient Outreach (Signed)
  Care Coordination   Initial Visit Note   04/20/2023 Name: Kristina [name] MRN: [id number] DOB: [birth year]  Kristina [name] is a 72 y.o. year old female who sees Margo Aye, Kathleene Hazel, MD for primary care. I spoke with  [name] by phone today.  What matters to the patients health and wellness today?  congestive Heart Failure (CHF) had recent fx left hip using wheelchair  Rescheduled Dr Margo Aye visit Does not feel safer to 04/24/23 Friend & daughter help with transport to medical appointments in a lower vehicle Home health continue to visit PT on hold right now as she is non weight bearing Surgeon to be seen  Poor insomnia   Goals Addressed             This Visit's Progress    [facility] care ccordination services   On track    Interventions Today    Flowsheet Row Most Recent Value  Chronic Disease   Chronic disease during today's visit Diabetes, Chronic Obstructive Pulmonary Disease (COPD), Congestive Heart Failure (CHF), Hypertension (HTN), Other  General Interventions   General Interventions Discussed/Reviewed General Interventions Discussed, Durable Medical Equipment (DME), Community Resources, Doctor Visits  Doctor Visits Discussed/Reviewed Specialist, PCP, Doctor Visits Discussed  Horticulturist, commercial (DME) Other  [temporary ramp]  Wheelchair Standard  PCP/Specialist Visits Compliance with follow-up visit  [confirmed has visits with pcp and surgeon scheduled]  Exercise Interventions   Exercise Discussed/Reviewed Exercise Discussed, Physical Activity, Assistive device use and maintanence  Physical Activity Discussed/Reviewed Physical Activity Discussed, Home Exercise Program (HEP)  Education Interventions   Education Provided Provided Education  Provided Verbal Education On Nutrition, Foot Care, Labs, Blood Sugar Monitoring, Mental Health/Coping with Illness, Exercise, Medication, Sick Day Rules, [employer], Development worker, community  [encouraged to call [facility] 24 hr nurse [phone]  5385/RN CM as needed, discussed post hospital food delivery, discussed her Diabetes is managed with diet only, Discussed her home health visits, Insurance OTC & transportation services]  Labs Reviewed Hgb A1c  Mental Health Interventions   Mental Health Discussed/Reviewed Mental Health Discussed, Coping Strategies  Nutrition Interventions   Nutrition Discussed/Reviewed Nutrition Discussed, Decreasing sugar intake, Decreasing fats, Fluid intake  Pharmacy Interventions   Pharmacy Dicussed/Reviewed Pharmacy Topics Discussed, Affording Medications  Safety Interventions   Safety Discussed/Reviewed Safety Discussed, Fall Risk              SDOH assessments and interventions completed:  Yes  SDOH Interventions Today    Flowsheet Row Most Recent Value  SDOH Interventions   Food Insecurity Interventions Intervention Not Indicated  Housing Interventions Intervention Not Indicated  Transportation Interventions Intervention Not Indicated  Utilities Interventions Intervention Not Indicated  Financial Strain Interventions Intervention Not Indicated  Social Connections Interventions Intervention Not Indicated  Health Literacy Interventions Intervention Not Indicated        Care Coordination Interventions:  Yes, provided   Follow up plan: Follow up call scheduled for 05/04/23    Encounter Outcome:  Pt. Visit Completed   Lawarence Meek L. Noelle Penner, RN, BSN, CCM [facility] Care Management Community Coordinator Office number [phone]

## 2023-04-20 NOTE — Patient Instructions (Signed)
Visit Information  Thank you for taking time to visit with me today. Please don't hesitate to contact me if I can be of assistance to you.   Following are the goals we discussed today:   Goals Addressed             This Visit's Progress    [facility] care ccordination services   On track    Interventions Today    Flowsheet Row Most Recent Value  Chronic Disease   Chronic disease during today's visit Diabetes, Chronic Obstructive Pulmonary Disease (COPD), Congestive Heart Failure (CHF), Hypertension (HTN), Other  General Interventions   General Interventions Discussed/Reviewed General Interventions Discussed, Durable Medical Equipment (DME), Community Resources, Doctor Visits  Doctor Visits Discussed/Reviewed Specialist, PCP, Doctor Visits Discussed  Horticulturist, commercial (DME) Other  [temporary ramp]  Wheelchair Standard  PCP/Specialist Visits Compliance with follow-up visit  [confirmed has visits with pcp and surgeon scheduled]  Exercise Interventions   Exercise Discussed/Reviewed Exercise Discussed, Physical Activity, Assistive device use and maintanence  Physical Activity Discussed/Reviewed Physical Activity Discussed, Home Exercise Program (HEP)  Education Interventions   Education Provided Provided Education  Provided Verbal Education On Nutrition, Foot Care, Labs, Blood Sugar Monitoring, Mental Health/Coping with Illness, Exercise, Medication, Sick Day Rules, [employer], Development worker, community  [encouraged to call [facility] 24 hr nurse [phone] CM as needed, discussed post hospital food delivery, discussed her Diabetes is managed with diet only, Discussed her home health visits, Insurance OTC & transportation services]  Labs Reviewed Hgb A1c  Mental Health Interventions   Mental Health Discussed/Reviewed Mental Health Discussed, Coping Strategies  Nutrition Interventions   Nutrition Discussed/Reviewed Nutrition Discussed, Decreasing sugar intake, Decreasing fats, Fluid intake   Pharmacy Interventions   Pharmacy Dicussed/Reviewed Pharmacy Topics Discussed, Affording Medications  Safety Interventions   Safety Discussed/Reviewed Safety Discussed, Fall Risk              Our next appointment is by telephone on 05/04/23 at 0900  Please call the care guide team at [phone] if you need to cancel or reschedule your appointment.   If you are experiencing a Mental Health or Behavioral Health Crisis or need someone to talk to, please call the Suicide and Crisis Lifeline: 988 call the [place] National Suicide Prevention Lifeline: (914)447-8395 or TTY: (970)633-2696 TTY 661 499 3025) to talk to a trained counselor call 1-800-273-TALK (toll free, 24 hour hotline) call the [facility]: (571)676-9068 call 911   No access to my chart, no request for copy of avs  The patient has been provided with contact information for the care management team and has been advised to call with any health related questions or concerns.   Shivonne Schwartzman L. Noelle Penner, RN, BSN, CCM [facility] Care Management Community Coordinator Office number [phone]

## 2023-04-24 DIAGNOSIS — S32402D Unspecified fracture of left acetabulum, subsequent encounter for fracture with routine healing: Secondary | ICD-10-CM | POA: Diagnosis not present

## 2023-05-02 DIAGNOSIS — S32401A Unspecified fracture of right acetabulum, initial encounter for closed fracture: Secondary | ICD-10-CM | POA: Insufficient documentation

## 2023-05-04 ENCOUNTER — Ambulatory Visit: Payer: Self-pay | Admitting: *Deleted

## 2023-05-04 NOTE — Patient Instructions (Addendum)
Visit Information  Thank you for taking time to visit with me today. Please don't hesitate to contact me if I can be of assistance to you.   Following are the goals we discussed today:   Goals Addressed             This Visit's Progress    COMPLETED: care cordination no follow up required       [facility] goal closure to prevent duplicate  goal      [facility] care ccordination services   On track    Interventions Today    Flowsheet Row Most Recent Value  Chronic Disease   Chronic disease during today's visit Chronic Obstructive Pulmonary Disease (COPD), Congestive Heart Failure (CHF), Hypertension (HTN), Other  [home & outpatient therapy, compression hose, pain in legs/restless leg syndrome]  General Interventions   General Interventions Discussed/Reviewed General Interventions Reviewed, Durable Medical Equipment (DME), Community Resources, Doctor Visits, Communication with  Doctor Visits Discussed/Reviewed Doctor Visits Reviewed, PCP  Horticulturist, commercial (DME) Dan Humphreys, Other  [compression hose]  Wheelchair Standard  PCP/Specialist Visits Compliance with follow-up visit  Communication with PCP/Specialists, RN  Merchant navy officer to pcp about noted increase of pain in leg at night with history of restless leg syndrome, need for orders for outpatient therapy]  Exercise Interventions   Exercise Discussed/Reviewed Exercise Reviewed, Physical Activity, Assistive device use and maintanence  [encouraged use of walker and compression hose]  Physical Activity Discussed/Reviewed Physical Activity Reviewed, Home Exercise Program (HEP), Types of exercise  [walking at [employer], home and outpatient therapy discussed]  Education Interventions   Education Provided Provided Education  [use and types of compression hose,]  Provided Verbal Education On Mental Health/Coping with Illness, Exercise, Medication, [employer], Other, Development worker, community  [employer], discussed  magnesium/potassium/hydration, cramps of legs at night, guidelines/insurance coverage for attending outpatient & home therapy. use of compression hose]  Mental Health Interventions   Mental Health Discussed/Reviewed Mental Health Reviewed, Coping Strategies  Nutrition Interventions   Nutrition Discussed/Reviewed Nutrition Reviewed, Fluid intake  Pharmacy Interventions   Pharmacy Dicussed/Reviewed Pharmacy Topics Reviewed, Medications and their functions  Safety Interventions   Safety Discussed/Reviewed Safety Reviewed, Fall Risk  [confirmed no falls since last outreach, encouraged her continued mobility and at a safe pace]              Our next appointment is by telephone on 05/18/23 at 0930  Please call the care guide team at [phone] if you need to cancel or reschedule your appointment.   If you are experiencing a Mental Health or Behavioral Health Crisis or need someone to talk to, please call the Suicide and Crisis Lifeline: 988 call the [place] National Suicide Prevention Lifeline: 308-702-5188 or TTY: 347 733 5989 TTY 425-608-1818) to talk to a trained counselor call 1-800-273-TALK (toll free, 24 hour hotline) call the [facility]: 8327191136 call 911   No computer access, no preference for copy of AVS    The patient has been provided with contact information for the care management team and has been advised to call with any health related questions or concerns.    L. Noelle Penner, RN, BSN, CCM [facility] Care Management Community Coordinator Office number [phone]

## 2023-05-04 NOTE — Patient Outreach (Addendum)
  Care Coordination   Follow Up Visit Note   05/04/2023 Name: Kristina [name] MRN: [id number] DOB: Jun 21, 1951  Kristina [name] is a 72 y.o. year old female who sees Kristina [name], Kristina Hazel, MD for primary care. I spoke with  Kristina [name] by phone today.  What matters to the patients health and wellness today?  PCP follow up was for 05/03/23 but did not see pcp related to inclement weather Rescheduled for 05/10/23 Thursday at 1155 had    Hip pain on 05/03/23 last night only in bed took pain pill this am still sore aching when put pressure on it throb below knee Picked up compression hose  Aware to options for compression HH PT & RN had last visits may be last time next week evaluation Now walking with walker   Pending out patient therapy Hx restless leg   Interventions Today    Flowsheet Row Most Recent Value  Chronic Disease   Chronic disease during today's visit Chronic Obstructive Pulmonary Disease (COPD), Congestive Heart Failure (CHF), Hypertension (HTN), Other  [home & outpatient therapy, compression hose, pain in legs/restless leg syndrome]  General Interventions   General Interventions Discussed/Reviewed General Interventions Reviewed, Durable Medical Equipment (DME), Community Resources, Doctor Visits, Communication with  Doctor Visits Discussed/Reviewed Doctor Visits Reviewed, PCP  Horticulturist, commercial (DME) Kristina [name], Other  [compression hose]  Wheelchair Standard  PCP/Specialist Visits Compliance with follow-up visit  Communication with PCP/Specialists, RN  Merchant navy officer to pcp about noted increase of pain in leg at night with history of restless leg syndrome, need for orders for outpatient therapy]  Exercise Interventions   Exercise Discussed/Reviewed Exercise Reviewed, Physical Activity, Assistive device use and maintanence  [encouraged use of walker and compression hose]  Physical Activity Discussed/Reviewed Physical Activity Reviewed, Home Exercise Program (HEP), Types of exercise   [walking at [employer], home and outpatient therapy discussed]  Education Interventions   Education Provided Provided Education  [use and types of compression hose,]  Provided Verbal Education On Mental Health/Coping with Illness, Exercise, Medication, [employer], Other, Development worker, community  [employer], discussed magnesium/potassium/hydration, cramps of legs at night, guidelines/insurance coverage for attending outpatient & home therapy. use of compression hose]  Mental Health Interventions   Mental Health Discussed/Reviewed Mental Health Reviewed, Coping Strategies  Nutrition Interventions   Nutrition Discussed/Reviewed Nutrition Reviewed, Fluid intake  Pharmacy Interventions   Pharmacy Dicussed/Reviewed Pharmacy Topics Reviewed, Medications and their functions  Safety Interventions   Safety Discussed/Reviewed Safety Reviewed, Fall Risk  [confirmed no falls since last outreach, encouraged her continued mobility and at a safe pace]       SDOH assessments and interventions completed:  No     Care Coordination Interventions:  Yes, provided   Follow up plan: Follow up call scheduled for 05/18/23    Encounter Outcome:  Pt. Visit Completed    L. Noelle Penner, RN, BSN, CCM [facility] Care Management Community Coordinator Office number [phone]

## 2023-05-10 DIAGNOSIS — K5909 Other constipation: Secondary | ICD-10-CM | POA: Insufficient documentation

## 2023-05-10 DIAGNOSIS — S32401D Unspecified fracture of right acetabulum, subsequent encounter for fracture with routine healing: Secondary | ICD-10-CM | POA: Diagnosis not present

## 2023-05-10 DIAGNOSIS — K649 Unspecified hemorrhoids: Secondary | ICD-10-CM | POA: Insufficient documentation

## 2023-05-17 ENCOUNTER — Other Ambulatory Visit: Payer: Self-pay

## 2023-05-18 ENCOUNTER — Encounter: Payer: Self-pay | Admitting: *Deleted

## 2023-05-18 ENCOUNTER — Ambulatory Visit: Payer: Self-pay | Admitting: *Deleted

## 2023-05-18 NOTE — Patient Outreach (Signed)
  Care Coordination   Follow Up Visit Note   05/18/2023 Name: Kristina [name] MRN: [id number] DOB: 03-01-1951  Kristina [name] is a 72 y.o. year old female who sees Margo Aye, Kathleene Hazel, MD for primary care. I spoke with  Bobbe Medico Enke by phone today.  What matters to the patients health and wellness today?  Pending outpatient therapy sessions. Patient reports not receiving a call for status of outpatient therapy sessions  Compression hose continues to use them  Denies other medical issues    Goals Addressed             This Visit's Progress    Patient will improve mobility- [facility] care ccordination services   On track     Patient Goals for Care Coordination:  Continue provider established plan of care- attend upcoming 06/01/23 outpatient therapy sessions adherence to all scheduled medical appointments Aware of the importance of reporting any/all new or worsening symptoms or management to provider Continue to take prescribed pharmacological and nonpharmacological medicines as ordered Report any social determinant of health barriers Review any education information provided and call for questions   Interventions Today    Flowsheet Row Most Recent Value  Chronic Disease   Chronic disease during today's visit Other  [mobility  pending outpatient rehab services]  General Interventions   General Interventions Discussed/Reviewed General Interventions Reviewed, Doctor Visits, Communication with  Doctor Visits Discussed/Reviewed Doctor Visits Reviewed, Specialist, PCP  PCP/Specialist Visits Compliance with follow-up visit  Communication with PCP/Specialists  [outreach via email to pcp RN, also to outpatient therapy office via phone Updated patient of scheduled session on 03/31/23]  Exercise Interventions   Exercise Discussed/Reviewed Exercise Reviewed, Physical Activity  Physical Activity Discussed/Reviewed Physical Activity Reviewed, PREP  [outpatient rehab sessions start 03/31/23]  Mental  Health Interventions   Mental Health Discussed/Reviewed Mental Health Reviewed, Coping Strategies              SDOH assessments and interventions completed:  No     Care Coordination Interventions:  Yes, provided   Follow up plan: Follow up call scheduled for 06/18/23    Encounter Outcome:  Pt. Visit Completed   Hector Taft L. Noelle Penner, RN, BSN, CCM [facility] Care Management Community Coordinator Office number [phone]

## 2023-05-18 NOTE — Patient Instructions (Addendum)
Visit Information  Thank you for taking time to visit with me today. Please don't hesitate to contact me if I can be of assistance to you.   Following are the goals we discussed today:   Goals Addressed             This Visit's Progress    Patient will improve mobility- [facility] care ccordination services   On track     Patient Goals for Care Coordination:  Continue provider established plan of care- attend upcoming 06/01/23 outpatient therapy sessions adherence to all scheduled medical appointments Aware of the importance of reporting any/all new or worsening symptoms or management to provider Continue to take prescribed pharmacological and nonpharmacological medicines as ordered Report any social determinant of health barriers Review any education information provided and call for questions   Interventions Today    Flowsheet Row Most Recent Value  Chronic Disease   Chronic disease during today's visit Other  [mobility  pending outpatient rehab services]  General Interventions   General Interventions Discussed/Reviewed General Interventions Reviewed, Doctor Visits, Communication with  Doctor Visits Discussed/Reviewed Doctor Visits Reviewed, Specialist, PCP  PCP/Specialist Visits Compliance with follow-up visit  Communication with PCP/Specialists  [outreach via email to pcp RN, also to outpatient therapy office via phone Updated patient of scheduled session on 03/31/23]  Exercise Interventions   Exercise Discussed/Reviewed Exercise Reviewed, Physical Activity  Physical Activity Discussed/Reviewed Physical Activity Reviewed, PREP  [outpatient rehab sessions start 03/31/23]  Mental Health Interventions   Mental Health Discussed/Reviewed Mental Health Reviewed, Coping Strategies              Our next appointment is by telephone on 06/18/23 at 0930  Please call the care guide team at [phone] if you need to cancel or reschedule your appointment.   If you are experiencing a  Mental Health or Behavioral Health Crisis or need someone to talk to, please call the Suicide and Crisis Lifeline: 988 call the [place] National Suicide Prevention Lifeline: (203)140-6749 or TTY: 803-493-8959 TTY 7044293620) to talk to a trained counselor call 1-800-273-TALK (toll free, 24 hour hotline) call the [facility]: [phone] call 911    No computer access, no preference for copy of AVS    The patient has been provided with contact information for the care management team and has been advised to call with any health related questions or concerns.   Taaj Hurlbut L. Noelle Penner, RN, BSN, CCM [facility] Care Management Community Coordinator Office number [phone]

## 2023-05-22 DIAGNOSIS — S32402D Unspecified fracture of left acetabulum, subsequent encounter for fracture with routine healing: Secondary | ICD-10-CM | POA: Diagnosis not present

## 2023-05-23 ENCOUNTER — Telehealth: Payer: Self-pay

## 2023-05-23 NOTE — Telephone Encounter (Signed)
Auth Submission: APPROVED Site of care: Site of care: [facility] Payer: [employer] MEDICARE  Medication & CPT/J Code(s) submitted: Prolia (Denosumab) [id number] Route of submission (phone, fax, portal): portal Phone # Fax # Auth type: Buy/Bill PB Units/visits requested: 2 visits, 60mg  q5months Reference number: [id number] Approval from: 05/22/23 to 05/21/24

## 2023-05-27 DIAGNOSIS — S32402D Unspecified fracture of left acetabulum, subsequent encounter for fracture with routine healing: Secondary | ICD-10-CM | POA: Diagnosis not present

## 2023-05-27 DIAGNOSIS — R2689 Other abnormalities of gait and mobility: Secondary | ICD-10-CM | POA: Diagnosis not present

## 2023-06-01 ENCOUNTER — Ambulatory Visit (HOSPITAL_COMMUNITY): Payer: Medicare HMO | Attending: Internal Medicine | Admitting: Physical Therapy

## 2023-06-01 ENCOUNTER — Other Ambulatory Visit: Payer: Self-pay

## 2023-06-01 DIAGNOSIS — M25552 Pain in left hip: Secondary | ICD-10-CM | POA: Insufficient documentation

## 2023-06-01 DIAGNOSIS — M6281 Muscle weakness (generalized): Secondary | ICD-10-CM | POA: Diagnosis not present

## 2023-06-01 DIAGNOSIS — M25652 Stiffness of left hip, not elsewhere classified: Secondary | ICD-10-CM | POA: Insufficient documentation

## 2023-06-01 DIAGNOSIS — R262 Difficulty in walking, not elsewhere classified: Secondary | ICD-10-CM | POA: Diagnosis not present

## 2023-06-01 NOTE — Therapy (Signed)
OUTPATIENT PHYSICAL THERAPY LOWER EXTREMITY EVALUATION   Patient Name: Kristina [name] MRN: [id number] DOB:1951/08/05, 72 y.o., female Today's Date: 06/01/2023  END OF SESSION:  PT End of Session - 06/01/23 1117     Visit Number 1    Number of Visits 16    Date for PT Re-Evaluation 07/27/23    Authorization Type [employer]    Progress Note Due on Visit 10    PT Start Time 1117    PT Stop Time 1200    PT Time Calculation (min) 43 min    Activity Tolerance Patient tolerated treatment well    Behavior During Therapy WFL for tasks assessed/performed             Past Medical History:  Diagnosis Date   Acute respiratory distress syndrome (ARDS) due to COVID-19 virus (HCC) 09/2019   Chronic combined systolic and diastolic CHF (congestive heart failure) (HCC)    Diabetes mellitus, type 2 (HCC)    GERD (gastroesophageal reflux disease)    Hyperlipidemia    Hypertension    Past Surgical History:  Procedure Laterality Date   COLONOSCOPY N/A 12/21/2016   Procedure: COLONOSCOPY;  Surgeon: Malissa Hippo, MD;  Location: [facility];  Service: Endoscopy;  Laterality: N/A;  930   NO PAST SURGERIES     ORIF ACETABULAR FRACTURE Left 03/16/2023   Procedure: OPEN REDUCTION INTERNAL FIXATION (ORIF) ACETABULAR FRACTURE STOPPA APPROACH;  Surgeon: Roby Lofts, MD;  Location: [facility];  Service: Orthopedics;  Laterality: Left;   Patient Active Problem List   Diagnosis Date Noted   Chronic constipation 05/10/2023   Hemorrhoids 05/10/2023   Closed fracture of right acetabulum (HCC) 05/02/2023   Acetabular fracture (HCC) 03/14/2023   Candidiasis of skin 02/12/2023   OP (osteoporosis) 01/22/2023   Proteinuria 06/15/2022   Sleep apnea 09/08/2021   Congestive heart failure (HCC) 07/29/2021   Hyperglycemia due to type 2 diabetes mellitus (HCC) 05/04/2021   Chronic systolic heart failure (HCC) 04/09/2021   Hypoxia    Acute hypoxemic respiratory failure (HCC) 04/05/2021   Dyspnea  04/05/2021   Tremor 04/05/2021   Impaired fasting glucose 01/25/2021   Gastroesophageal reflux disease without esophagitis 01/25/2021   Mixed hyperlipidemia 01/25/2021   Morbid obesity (HCC) 01/25/2021   Prediabetes 01/25/2021   Tachycardia 01/25/2021   Pain in left knee 05/20/2020   Essential hypertension 10/08/2019   Uncontrolled diabetes mellitus 10/08/2019   UTI (urinary tract infection) 10/08/2019   Thrush 10/08/2019   Pneumonia due to COVID-19 virus 10/08/2019   Chronic respiratory failure with hypoxia (HCC) 10/02/2019   Acute respiratory distress syndrome (ARDS) due to COVID-19 virus (HCC) 10/02/2019   Pain in joint of right shoulder 07/15/2018   Pain in right foot 07/15/2018   Special screening for malignant neoplasms, colon 11/06/2016    PCP: Benita Stabile, MD  REFERRING PROVIDER: Dayna Ramus, FNP  REFERRING DIAG: [phone] (ICD-10-CM) - Unspecified fracture of right acetabulum, subsequent encounter for fracture with routine healing  THERAPY DIAG:  Muscle weakness (generalized)  Pain in left hip  Stiffness of left hip, not elsewhere classified  Difficulty in walking, not elsewhere classified  Rationale for Evaluation and Treatment: Rehabilitation  ONSET DATE: 03/16/23 ORIF  SUBJECTIVE:   SUBJECTIVE STATEMENT: Pt reports a fall in June and underwent L LE ORIF. States she was bed ridden for a while and then in a wheelchair because she couldn't put weight on the leg but now can use rollator. Pt states it still hurts. If she  moves her leg externally/abducts it will hurt. L leg can give way on her. Before the fall she used no a/d. She reports she is able to clean but has to hold on to multiple things and take many rest breaks. Standing too long will hurt her back. Still needs to sit for showers. Pt reports bad balance at baseline.  PERTINENT HISTORY: Per H&P on 05/10/23: 6/19 fall that caused a closed nondisplaced fracture of the acetabulum and underwent ORIF  6/21. she was d/c on 6/25 to a SNF for PT/OT evaluation she was d/c with home PT which is now done but pt is doing home exercises.  PAIN:  Are you having pain? Yes: NPRS scale: 3 or 4 currently, at worst 7 or 8 with increased pressure/10 Pain location: Sometimes in groin and can radiates down side of L LE Pain description: Throbbing Aggravating factors: abducting/externally rotate, prolonged pressure Relieving factors: Resting  PRECAUTIONS: Fall  RED FLAGS: None   WEIGHT BEARING RESTRICTIONS: No  FALLS:  Has patient fallen in last 6 months? No  LIVING ENVIRONMENT: Lives with: lives with their spouse Lives in: House/apartment Stairs: Yes: External: 3 steps; has a ramp Has following equipment at home: Walker - 4 wheeled, w/c, bedside commode  OCCUPATION: Retired -- likes to be outside (can normally ride the lawnmower)  PLOF: Independent  PATIENT GOALS: Return to walking without a/d  NEXT MD VISIT: 07/19/23  OBJECTIVE:   DIAGNOSTIC FINDINGS:  6/21 pelvis x-ray IMPRESSION: Status post surgical internal fixation of left acetabular fracture. Continued presence of mildly displaced left inferior pubic ramus fracture.  PATIENT SURVEYS:  Lower Extremity Functional Score: 15 / 80 = 18.8 %  COGNITION: Overall cognitive status: Within functional limits for tasks assessed     SENSATION: WFL  EDEMA:  "L foot stays swollen all the time since surgery"  MUSCLE LENGTH: Hamstrings: did not assess Thomas test: Right 10 deg; Left 0 deg  POSTURE: rounded shoulders and flexed trunk   PALPATION: TTP L Hip  LOWER EXTREMITY ROM:  Active ROM Right eval Left eval  Hip flexion 115 100  Hip extension 10 0  Hip abduction 20 15  Hip adduction    Hip internal rotation    Hip external rotation    Knee flexion    Knee extension    Ankle dorsiflexion    Ankle plantarflexion    Ankle inversion    Ankle eversion     (Blank rows = not tested)  LOWER EXTREMITY MMT:  MMT  Right eval Left eval  Hip flexion 5 4  Hip extension 4 3+  Hip abduction 4 3+  Hip adduction    Hip internal rotation    Hip external rotation    Knee flexion 5 5  Knee extension 5 Unable to perform SLR  Ankle dorsiflexion    Ankle plantarflexion    Ankle inversion    Ankle eversion     (Blank rows = not tested)  LOWER EXTREMITY SPECIAL TESTS:  Did not assess  FUNCTIONAL TESTS:  5 times sit to stand: 17.4 sec (with UE support) Gait speed 0.58 m/sec   GAIT: Distance walked: 200'x2 Assistive device utilized:  Rollator Level of assistance: Modified independence Comments: L foot slightly externally rotated, twists hip into more ER coming into knee flexion with toe off   TODAY'S TREATMENT:  DATE: 06/01/23  See HEP  PATIENT EDUCATION:  Education details: Exam findings, POC, initial HEP Person educated: Patient Education method: Explanation, Demonstration, and Handouts Education comprehension: verbalized understanding, returned demonstration, and needs further education  HOME EXERCISE PROGRAM: Access Code: [email or web address] URL: [email or web address] Date: 06/01/2023 Prepared by: Vernon Prey April Kirstie Peri  Exercises - Supine Heel Slide  - 1 x daily - 7 x weekly - 3 sets - 10 reps - Supine Short Arc Quad  - 1 x daily - 7 x weekly - 2-3 sets - 10 reps - Hooklying Isometric Clamshell  - 1 x daily - 7 x weekly - 2-3 sets - 10 reps - Supine Bridge with Resistance Band  - 1 x daily - 7 x weekly - 2-3 sets - 10 reps  ASSESSMENT:  CLINICAL IMPRESSION: Patient is a 72 y.o. F who was seen today for physical therapy evaluation and treatment s/p L ORIF for acetabular fracture on 03/16/23. Assessment significant for L LE weakness, decreased ROM, pain, decreased tolerance to prolonged weight bearing, and decreased balance affecting standing, amb, and  squatting limiting home and community tasks. Pt will benefit from PT to address these deficits to return to PLOF.   OBJECTIVE IMPAIRMENTS: Abnormal gait, decreased activity tolerance, decreased balance, decreased endurance, decreased mobility, difficulty walking, decreased ROM, decreased strength, hypomobility, increased fascial restrictions, impaired flexibility, improper body mechanics, postural dysfunction, and pain.   ACTIVITY LIMITATIONS: lifting, bending, standing, squatting, stairs, transfers, bathing, toileting, hygiene/grooming, and locomotion level  PARTICIPATION LIMITATIONS: meal prep, cleaning, laundry, driving, shopping, community activity, and church  PERSONAL FACTORS: Age, Fitness, Past/current experiences, and Time since onset of injury/illness/exacerbation are also affecting patient's functional outcome.   REHAB POTENTIAL: Good  CLINICAL DECISION MAKING: Evolving/moderate complexity  EVALUATION COMPLEXITY: Moderate   GOALS: Goals reviewed with patient? Yes  SHORT TERM GOALS: Target date: 06/29/2023  Pt will be ind with initial HEP Baseline: Goal status: INITIAL  2.  Pt will demo L = R hip abduction to improve ease with car transfers and bed mobility Baseline:  Goal status: INITIAL  3.  Pt will improve L hip flexion to at least 110 deg to improve car transfers and bed mobility Baseline:  Goal status: INITIAL  4. Pt will be able to perform SLR to demo increased L LE strength Baseline:  Goal status: INITIAL   LONG TERM GOALS: Target date: 07/27/2023   Pt will be ind with progression and advancement of HEP Baseline:  Goal status: INITIAL  2.  Pt will have improved 5x STS to </=13 sec to demo decreased fall risk and improved functional LE strength Baseline:  Goal status: INITIAL  3.  Pt will be able to amb at least 150' without RW for home amb Baseline:  Goal status: INITIAL  4.  Pt will report LEFS of >/=28.8% to demo MCID Baseline:  Goal status:  INITIAL    PLAN:  PT FREQUENCY: 2x/week  PT DURATION: 8 [place]  PLANNED INTERVENTIONS: Therapeutic exercises, Therapeutic activity, Neuromuscular re-education, Balance training, Gait training, Patient/Family education, Self Care, Joint mobilization, Stair training, Aquatic Therapy, Dry Needling, Electrical stimulation, Spinal mobilization, Cryotherapy, Moist heat, Taping, Ionotophoresis 4mg /ml Dexamethasone, Manual therapy, and Re-evaluation  PLAN FOR NEXT SESSION: Assess response to HEP. Work on hip ROM, strengthening, weight bearing/balance.   Latasha Buczkowski April Ma L Shane Melby, PT 06/01/2023, 1:36 PM

## 2023-06-04 IMAGING — MG MM DIGITAL SCREENING BILAT W/ TOMO AND CAD
6 of 12 series · 6 of 36 positions shown · non-contrast
Comparison: Previous exam(s).

CLINICAL DATA: Screening.

EXAM:
DIGITAL SCREENING BILATERAL MAMMOGRAM WITH TOMOSYNTHESIS AND CAD
TECHNIQUE: Bilateral screening digital craniocaudal and mediolateral oblique
mammograms were obtained. Bilateral screening digital breast
tomosynthesis was performed. The images were evaluated with
computer-aided detection.

[R CC synth-2D]
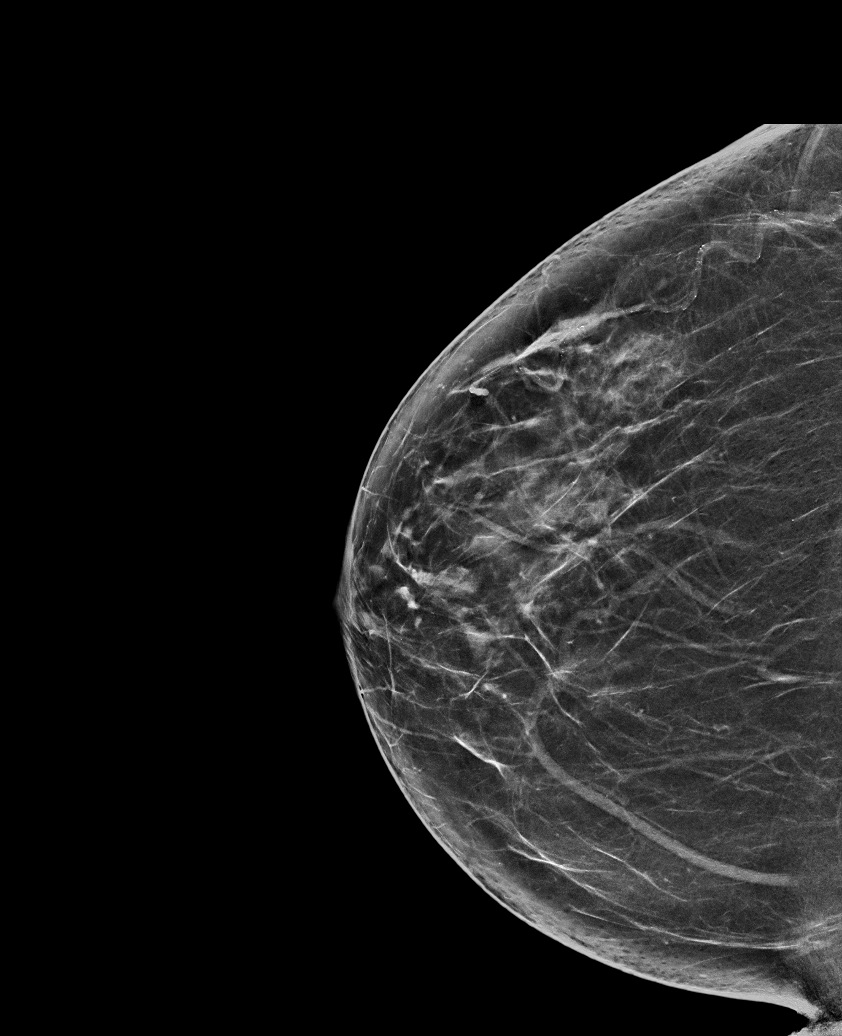

[R MLO synth-2D (1 of 2)]
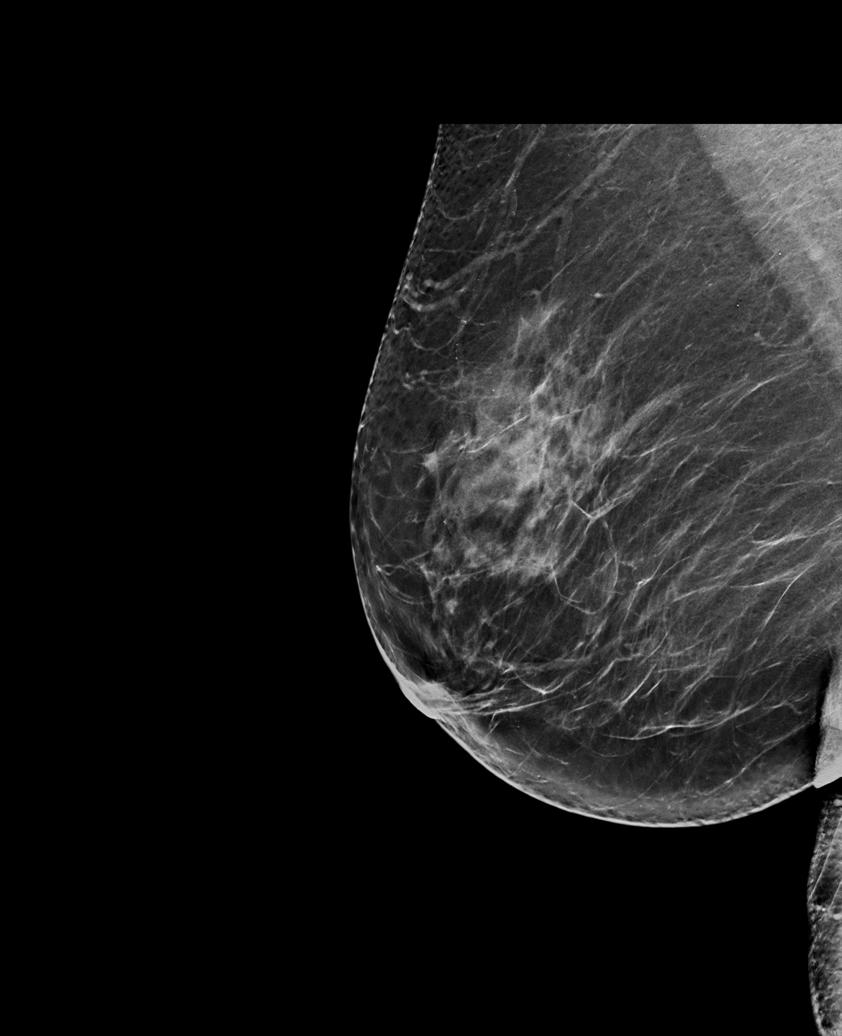

[L MLO synth-2D]
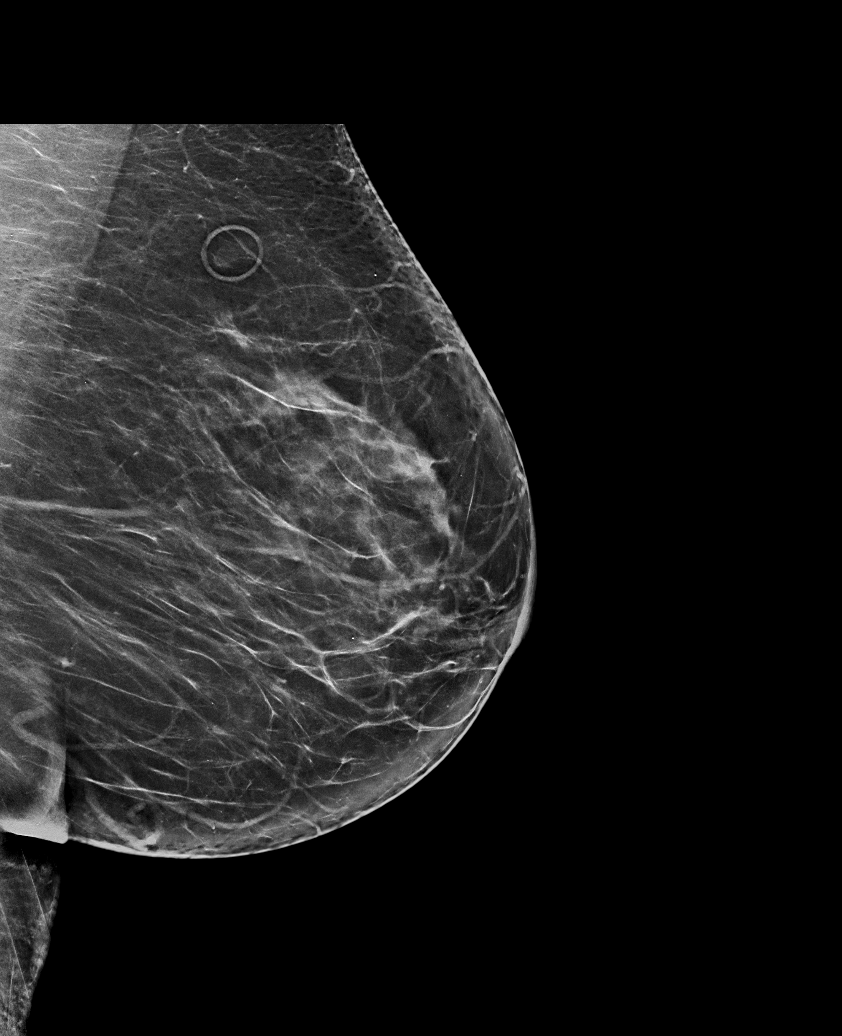

[L CC synth-2D (1 of 2)]
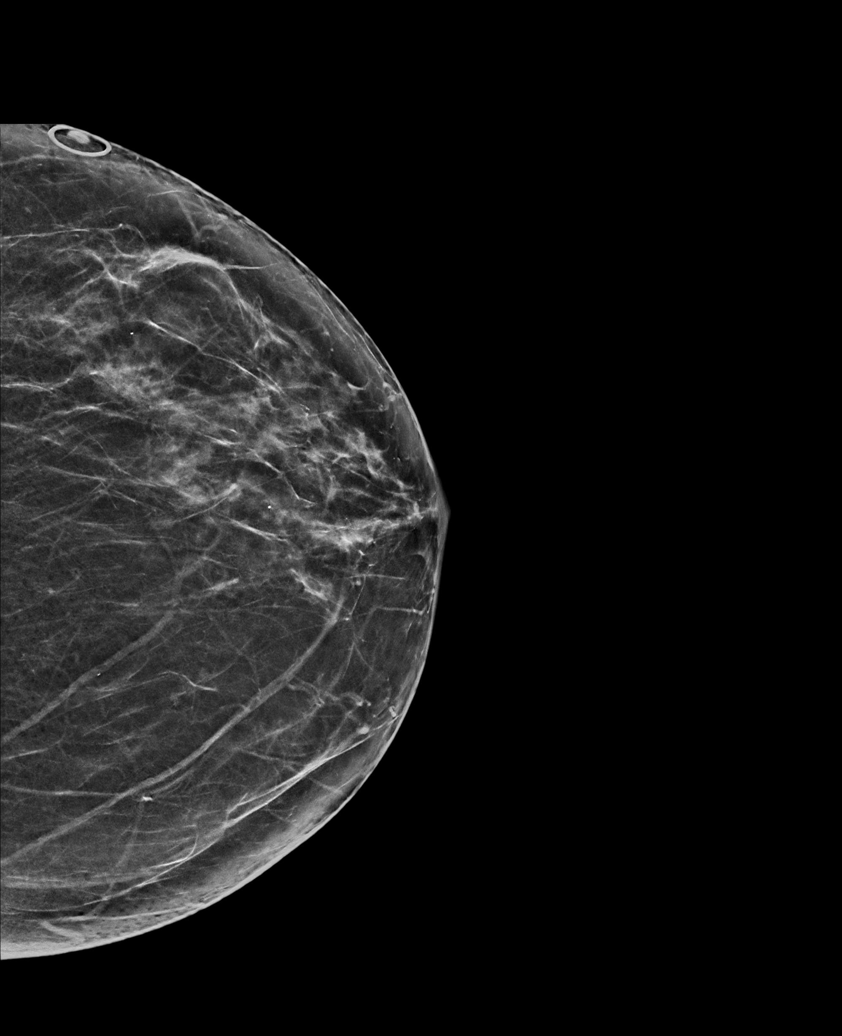

[R MLO synth-2D (2 of 2)]
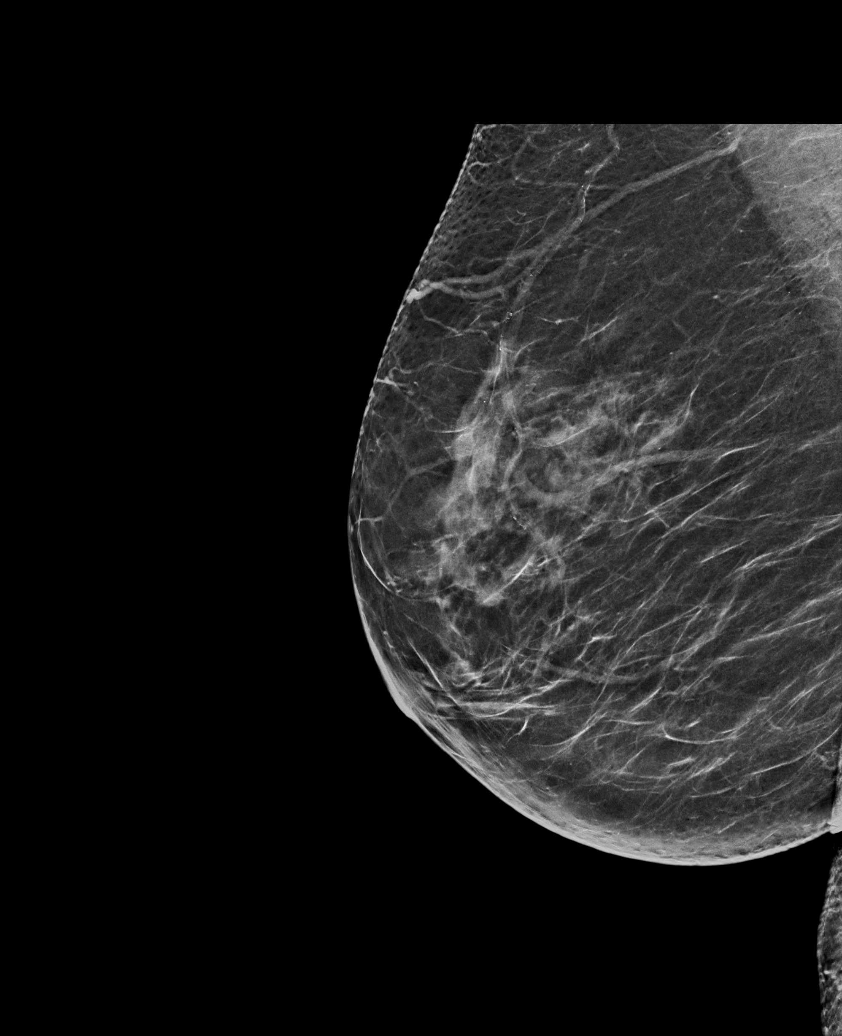

[L CC synth-2D (2 of 2)]
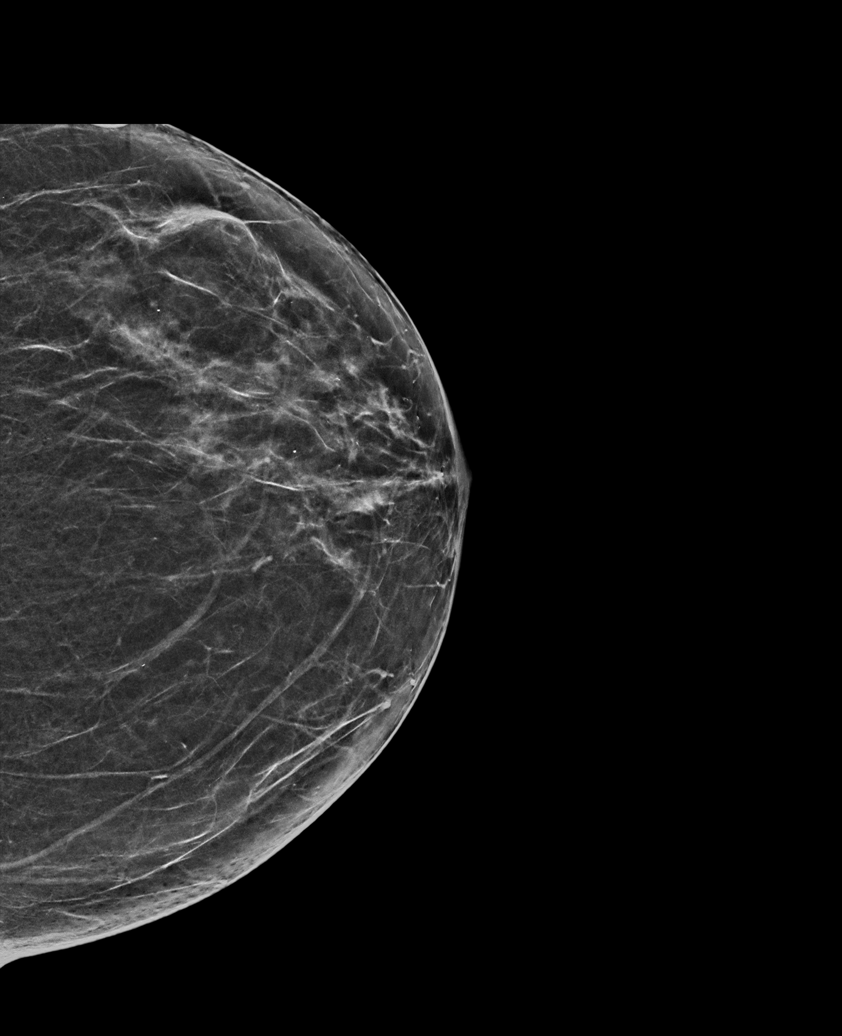

[6 of 36 positions shown; findings below may reference images not displayed]

ACR Breast Density Category b: There are scattered areas of
fibroglandular density.
FINDINGS: There are no findings suspicious for malignancy.
IMPRESSION: No mammographic evidence of malignancy. A result letter of this
screening mammogram will be mailed directly to the patient.

RECOMMENDATION:
Screening mammogram in one year. (Code:51-O-LD2)

BI-RADS CATEGORY  1: Negative.

## 2023-06-08 ENCOUNTER — Ambulatory Visit (HOSPITAL_COMMUNITY): Payer: Medicare HMO | Admitting: Physical Therapy

## 2023-06-08 DIAGNOSIS — R262 Difficulty in walking, not elsewhere classified: Secondary | ICD-10-CM

## 2023-06-08 DIAGNOSIS — M25552 Pain in left hip: Secondary | ICD-10-CM

## 2023-06-08 DIAGNOSIS — M6281 Muscle weakness (generalized): Secondary | ICD-10-CM | POA: Diagnosis not present

## 2023-06-08 DIAGNOSIS — M25652 Stiffness of left hip, not elsewhere classified: Secondary | ICD-10-CM | POA: Diagnosis not present

## 2023-06-08 NOTE — Therapy (Signed)
OUTPATIENT PHYSICAL THERAPY TREATMENT   Patient Name: Kristina [name] MRN: [id number] DOB:1951-03-27, 72 y.o., female Today's Date: 06/08/2023  END OF SESSION:  PT End of Session - 06/08/23 0931     Visit Number 2    Number of Visits 16    Date for PT Re-Evaluation 07/27/23    Authorization Type [employer]    Progress Note Due on Visit 10    PT Start Time 0935    PT Stop Time 1015    PT Time Calculation (min) 40 min    Activity Tolerance Patient tolerated treatment well    Behavior During Therapy WFL for tasks assessed/performed              Past Medical History:  Diagnosis Date   Acute respiratory distress syndrome (ARDS) due to COVID-19 virus (HCC) 09/2019   Chronic combined systolic and diastolic CHF (congestive heart failure) (HCC)    Diabetes mellitus, type 2 (HCC)    GERD (gastroesophageal reflux disease)    Hyperlipidemia    Hypertension    Past Surgical History:  Procedure Laterality Date   COLONOSCOPY N/A 12/21/2016   Procedure: COLONOSCOPY;  Surgeon: Malissa Hippo, MD;  Location: [facility];  Service: Endoscopy;  Laterality: N/A;  930   NO PAST SURGERIES     ORIF ACETABULAR FRACTURE Left 03/16/2023   Procedure: OPEN REDUCTION INTERNAL FIXATION (ORIF) ACETABULAR FRACTURE STOPPA APPROACH;  Surgeon: Roby Lofts, MD;  Location: [facility];  Service: Orthopedics;  Laterality: Left;   Patient Active Problem List   Diagnosis Date Noted   Chronic constipation 05/10/2023   Hemorrhoids 05/10/2023   Closed fracture of right acetabulum (HCC) 05/02/2023   Acetabular fracture (HCC) 03/14/2023   Candidiasis of skin 02/12/2023   OP (osteoporosis) 01/22/2023   Proteinuria 06/15/2022   Sleep apnea 09/08/2021   Congestive heart failure (HCC) 07/29/2021   Hyperglycemia due to type 2 diabetes mellitus (HCC) 05/04/2021   Chronic systolic heart failure (HCC) 04/09/2021   Hypoxia    Acute hypoxemic respiratory failure (HCC) 04/05/2021   Dyspnea 04/05/2021    Tremor 04/05/2021   Impaired fasting glucose 01/25/2021   Gastroesophageal reflux disease without esophagitis 01/25/2021   Mixed hyperlipidemia 01/25/2021   Morbid obesity (HCC) 01/25/2021   Prediabetes 01/25/2021   Tachycardia 01/25/2021   Pain in left knee 05/20/2020   Essential hypertension 10/08/2019   Uncontrolled diabetes mellitus 10/08/2019   UTI (urinary tract infection) 10/08/2019   Thrush 10/08/2019   Pneumonia due to COVID-19 virus 10/08/2019   Chronic respiratory failure with hypoxia (HCC) 10/02/2019   Acute respiratory distress syndrome (ARDS) due to COVID-19 virus (HCC) 10/02/2019   Pain in joint of right shoulder 07/15/2018   Pain in right foot 07/15/2018   Special screening for malignant neoplasms, colon 11/06/2016    PCP: Benita Stabile, MD  REFERRING PROVIDER: Dayna Ramus, FNP  REFERRING DIAG: [phone] (ICD-10-CM) - Unspecified fracture of right acetabulum, subsequent encounter for fracture with routine healing  THERAPY DIAG:  Pain in left hip  Stiffness of left hip, not elsewhere classified  Muscle weakness (generalized)  Difficulty in walking, not elsewhere classified  Rationale for Evaluation and Treatment: Rehabilitation  ONSET DATE: 03/16/23 ORIF  SUBJECTIVE:   SUBJECTIVE STATEMENT: Pt states she's a little stiff in the morning. Pt states she was pretty sore after last session. Has been able to do the exercises.   PERTINENT HISTORY: Per H&P on 05/10/23: 6/19 fall that caused a closed nondisplaced fracture of the acetabulum and  underwent ORIF 6/21. she was d/c on 6/25 to a SNF for PT/OT evaluation she was d/c with home PT which is now done but pt is doing home exercises.  PAIN:  Are you having pain? Yes: NPRS scale: 4 currently, at worst 7 or 8 with increased pressure/10 Pain location: Sometimes in groin and can radiates down side of L LE Pain description: Throbbing Aggravating factors: abducting/externally rotate, prolonged  pressure Relieving factors: Resting  PRECAUTIONS: Fall  RED FLAGS: None   WEIGHT BEARING RESTRICTIONS: No  FALLS:  Has patient fallen in last 6 months? No  LIVING ENVIRONMENT: Lives with: lives with their spouse Lives in: House/apartment Stairs: Yes: External: 3 steps; has a ramp Has following equipment at home: Walker - 4 wheeled, w/c, bedside commode  OCCUPATION: Retired -- likes to be outside (can normally ride the lawnmower)  PLOF: Independent  PATIENT GOALS: Return to walking without a/d  NEXT MD VISIT: 07/19/23  OBJECTIVE:   DIAGNOSTIC FINDINGS:  6/21 pelvis x-ray IMPRESSION: Status post surgical internal fixation of left acetabular fracture. Continued presence of mildly displaced left inferior pubic ramus fracture.  PATIENT SURVEYS:  Lower Extremity Functional Score: 15 / 80 = 18.8 %  COGNITION: Overall cognitive status: Within functional limits for tasks assessed     SENSATION: WFL  EDEMA:  "L foot stays swollen all the time since surgery"  MUSCLE LENGTH: Hamstrings: did not assess Thomas test: Right 10 deg; Left 0 deg  POSTURE: rounded shoulders and flexed trunk   PALPATION: TTP L Hip  LOWER EXTREMITY ROM:  Active ROM Right eval Left eval  Hip flexion 115 100  Hip extension 10 0  Hip abduction 20 15  Hip adduction    Hip internal rotation    Hip external rotation    Knee flexion    Knee extension    Ankle dorsiflexion    Ankle plantarflexion    Ankle inversion    Ankle eversion     (Blank rows = not tested)  LOWER EXTREMITY MMT:  MMT Right eval Left eval  Hip flexion 5 4  Hip extension 4 3+  Hip abduction 4 3+  Hip adduction    Hip internal rotation    Hip external rotation    Knee flexion 5 5  Knee extension 5 Unable to perform SLR  Ankle dorsiflexion    Ankle plantarflexion    Ankle inversion    Ankle eversion     (Blank rows = not tested)  LOWER EXTREMITY SPECIAL TESTS:  Did not assess  FUNCTIONAL TESTS:  5  times sit to stand: 17.4 sec (with UE support) Gait speed 0.58 m/sec   GAIT: Distance walked: 200'x2 Assistive device utilized:  Rollator Level of assistance: Modified independence Comments: L foot slightly externally rotated, twists hip into more ER coming into knee flexion with toe off   TODAY'S TREATMENT:  DATE:  06/08/23 Nustep L5 x 5 min, UEs/LEs Supine Heel slide 2x10 Hip ext iso into bed with knee extended 10x3" Hip flexor stretch 2x30" Hip abd/add AROM 2x10 SAQ 2x10 SLR short range with bolster under knee 2x10 Hip flexion AAROM with towel support 2x10 Clamshell green TB 2x10  Bridge green TB around knees 2x10 Standing  Hip pendulums on step fwd/bwd, side to side, circles clockwise & counter clockwise x30" each  One foot on 6" step rock forward/backward working on glute iso and weight bearing/weight shifting 2x10  Mini squat 2x10  06/01/23  See HEP  PATIENT EDUCATION:  Education details: Exam findings, POC, initial HEP Person educated: Patient Education method: Explanation, Demonstration, and Handouts Education comprehension: verbalized understanding, returned demonstration, and needs further education  HOME EXERCISE PROGRAM: Access Code: [email or web address] URL: [email or web address] Date: 06/01/2023 Prepared by: Vernon Prey April Kirstie Peri  Exercises - Supine Heel Slide  - 1 x daily - 7 x weekly - 3 sets - 10 reps - Supine Short Arc Quad  - 1 x daily - 7 x weekly - 2-3 sets - 10 reps - Hooklying Isometric Clamshell  - 1 x daily - 7 x weekly - 2-3 sets - 10 reps - Supine Bridge with Resistance Band  - 1 x daily - 7 x weekly - 2-3 sets - 10 reps  ASSESSMENT:  CLINICAL IMPRESSION: Reviewed pt's HEP. Included more stretching/ROM this session. Continued her hip strengthening with good pt tolerance.   From eval: Patient is a 72 y.o. F who  was seen today for physical therapy evaluation and treatment s/p L ORIF for acetabular fracture on 03/16/23. Assessment significant for L LE weakness, decreased ROM, pain, decreased tolerance to prolonged weight bearing, and decreased balance affecting standing, amb, and squatting limiting home and community tasks. Pt will benefit from PT to address these deficits to return to PLOF.   OBJECTIVE IMPAIRMENTS: Abnormal gait, decreased activity tolerance, decreased balance, decreased endurance, decreased mobility, difficulty walking, decreased ROM, decreased strength, hypomobility, increased fascial restrictions, impaired flexibility, improper body mechanics, postural dysfunction, and pain.    GOALS: Goals reviewed with patient? Yes  SHORT TERM GOALS: Target date: 06/29/2023  Pt will be ind with initial HEP Baseline: Goal status: INITIAL  2.  Pt will demo L = R hip abduction to improve ease with car transfers and bed mobility Baseline:  Goal status: INITIAL  3.  Pt will improve L hip flexion to at least 110 deg to improve car transfers and bed mobility Baseline:  Goal status: INITIAL  4. Pt will be able to perform SLR to demo increased L LE strength Baseline:  Goal status: INITIAL   LONG TERM GOALS: Target date: 07/27/2023   Pt will be ind with progression and advancement of HEP Baseline:  Goal status: INITIAL  2.  Pt will have improved 5x STS to </=13 sec to demo decreased fall risk and improved functional LE strength Baseline:  Goal status: INITIAL  3.  Pt will be able to amb at least 150' without RW for home amb Baseline:  Goal status: INITIAL  4.  Pt will report LEFS of >/=28.8% to demo MCID Baseline:  Goal status: INITIAL    PLAN:  PT FREQUENCY: 2x/week  PT DURATION: 8 [place]  PLANNED INTERVENTIONS: Therapeutic exercises, Therapeutic activity, Neuromuscular re-education, Balance training, Gait training, Patient/Family education, Self Care, Joint mobilization, Stair  training, Aquatic Therapy, Dry Needling, Electrical stimulation, Spinal mobilization, Cryotherapy, Moist heat, Taping, Ionotophoresis 4mg /ml Dexamethasone, Manual therapy, and Re-evaluation  PLAN  FOR NEXT SESSION: Assess response to HEP. Work on hip ROM, strengthening, weight bearing/balance.   Isidro Monks April Ma L Kelsei Defino, PT 06/08/2023, 9:32 AM

## 2023-06-12 ENCOUNTER — Encounter (HOSPITAL_COMMUNITY): Payer: Self-pay | Admitting: Physical Therapy

## 2023-06-12 ENCOUNTER — Ambulatory Visit (HOSPITAL_COMMUNITY): Payer: Medicare HMO | Admitting: Physical Therapy

## 2023-06-12 DIAGNOSIS — M25652 Stiffness of left hip, not elsewhere classified: Secondary | ICD-10-CM

## 2023-06-12 DIAGNOSIS — R262 Difficulty in walking, not elsewhere classified: Secondary | ICD-10-CM

## 2023-06-12 DIAGNOSIS — M25552 Pain in left hip: Secondary | ICD-10-CM | POA: Diagnosis not present

## 2023-06-12 DIAGNOSIS — M6281 Muscle weakness (generalized): Secondary | ICD-10-CM

## 2023-06-12 NOTE — Therapy (Signed)
OUTPATIENT PHYSICAL THERAPY TREATMENT   Patient Name: Kristina [name] MRN: [id number] DOB:1951-09-01, 72 y.o., female Today's Date: 06/12/2023  END OF SESSION:  PT End of Session - 06/12/23 0921     Visit Number 3    Number of Visits 16    Date for PT Re-Evaluation 07/27/23    Authorization Type [employer]    Progress Note Due on Visit 10    PT Start Time 0925    PT Stop Time 1010    PT Time Calculation (min) 45 min    Activity Tolerance Patient tolerated treatment well    Behavior During Therapy [facility] for tasks assessed/performed               Past Medical History:  Diagnosis Date   Acute respiratory distress syndrome (ARDS) due to COVID-19 virus (HCC) 09/2019   Chronic combined systolic and diastolic CHF (congestive heart failure) (HCC)    Diabetes mellitus, type 2 (HCC)    GERD (gastroesophageal reflux disease)    Hyperlipidemia    Hypertension    Past Surgical History:  Procedure Laterality Date   COLONOSCOPY N/A 12/21/2016   Procedure: COLONOSCOPY;  Surgeon: Malissa Hippo, MD;  Location: [facility];  Service: Endoscopy;  Laterality: N/A;  930   NO PAST SURGERIES     ORIF ACETABULAR FRACTURE Left 03/16/2023   Procedure: OPEN REDUCTION INTERNAL FIXATION (ORIF) ACETABULAR FRACTURE STOPPA APPROACH;  Surgeon: Roby Lofts, MD;  Location: [facility] OR;  Service: Orthopedics;  Laterality: Left;   Patient Active Problem List   Diagnosis Date Noted   Chronic constipation 05/10/2023   Hemorrhoids 05/10/2023   Closed fracture of right acetabulum (HCC) 05/02/2023   Acetabular fracture (HCC) 03/14/2023   Candidiasis of skin 02/12/2023   OP (osteoporosis) 01/22/2023   Proteinuria 06/15/2022   Sleep apnea 09/08/2021   Congestive heart failure (HCC) 07/29/2021   Hyperglycemia due to type 2 diabetes mellitus (HCC) 05/04/2021   Chronic systolic heart failure (HCC) 04/09/2021   Hypoxia    Acute hypoxemic respiratory failure (HCC) 04/05/2021   Dyspnea 04/05/2021    Tremor 04/05/2021   Impaired fasting glucose 01/25/2021   Gastroesophageal reflux disease without esophagitis 01/25/2021   Mixed hyperlipidemia 01/25/2021   Morbid obesity (HCC) 01/25/2021   Prediabetes 01/25/2021   Tachycardia 01/25/2021   Pain in left knee 05/20/2020   Essential hypertension 10/08/2019   Uncontrolled diabetes mellitus 10/08/2019   UTI (urinary tract infection) 10/08/2019   Thrush 10/08/2019   Pneumonia due to COVID-19 virus 10/08/2019   Chronic respiratory failure with hypoxia (HCC) 10/02/2019   Acute respiratory distress syndrome (ARDS) due to COVID-19 virus (HCC) 10/02/2019   Pain in joint of right shoulder 07/15/2018   Pain in right foot 07/15/2018   Special screening for malignant neoplasms, colon 11/06/2016    PCP: Benita Stabile, MD  REFERRING PROVIDER: Dayna Ramus, FNP  REFERRING DIAG: [id number] (ICD-10-CM) - Unspecified fracture of left acetabulum, subsequent encounter for fracture with routine healing  THERAPY DIAG:  Pain in left hip  Stiffness of left hip, not elsewhere classified  Muscle weakness (generalized)  Difficulty in walking, not elsewhere classified  Rationale for Evaluation and Treatment: Rehabilitation  ONSET DATE: 03/16/23 ORIF  SUBJECTIVE:   SUBJECTIVE STATEMENT: Pt states hip is doing pretty good. Kind of stiff this morning when she first got up. Not too bad after last session.   PERTINENT HISTORY: Per H&P on 05/10/23: 6/19 fall that caused a closed nondisplaced fracture of the acetabulum and underwent  ORIF 6/21. she was d/c on 6/25 to a SNF for PT/OT evaluation she was d/c with home PT which is now done but pt is doing home exercises.  PAIN:  Are you having pain? Yes: NPRS scale: 4 currently, at worst 7 or 8 with increased pressure/10 Pain location: Sometimes in groin and can radiates down side of L LE Pain description: Throbbing Aggravating factors: abducting/externally rotate, prolonged pressure Relieving factors:  Resting  PRECAUTIONS: Fall  RED FLAGS: None   WEIGHT BEARING RESTRICTIONS: No  FALLS:  Has patient fallen in last 6 months? No  LIVING ENVIRONMENT: Lives with: lives with their spouse Lives in: House/apartment Stairs: Yes: External: 3 steps; has a ramp Has following equipment at home: Walker - 4 wheeled, w/c, bedside commode  OCCUPATION: Retired -- likes to be outside (can normally ride the lawnmower)  PLOF: Independent  PATIENT GOALS: Return to walking without a/d  NEXT MD VISIT: 07/19/23  OBJECTIVE:   DIAGNOSTIC FINDINGS:  6/21 pelvis x-ray IMPRESSION: Status post surgical internal fixation of left acetabular fracture. Continued presence of mildly displaced left inferior pubic ramus fracture.  PATIENT SURVEYS:  Lower Extremity Functional Score: 15 / 80 = 18.8 %  EDEMA:  "L foot stays swollen all the time since surgery"  MUSCLE LENGTH: Hamstrings: did not assess Thomas test: Right 10 deg; Left 0 deg  POSTURE: rounded shoulders and flexed trunk   PALPATION: TTP L Hip  LOWER EXTREMITY ROM:  Active ROM Right eval Left eval Left 06/12/23   Hip flexion 115 100 95 in supine  Hip extension 10 0   Hip abduction 20 15   Hip adduction     Hip internal rotation     Hip external rotation     Knee flexion     Knee extension     Ankle dorsiflexion     Ankle plantarflexion     Ankle inversion     Ankle eversion      (Blank rows = not tested)  LOWER EXTREMITY MMT:  MMT Right eval Left eval  Hip flexion 5 4  Hip extension 4 3+  Hip abduction 4 3+  Hip adduction    Hip internal rotation    Hip external rotation    Knee flexion 5 5  Knee extension 5 Unable to perform SLR  Ankle dorsiflexion    Ankle plantarflexion    Ankle inversion    Ankle eversion     (Blank rows = not tested)  LOWER EXTREMITY SPECIAL TESTS:  Did not assess  FUNCTIONAL TESTS:  5 times sit to stand: 17.4 sec (with UE support) Gait speed 0.58 m/sec   GAIT: Distance  walked: 200'x2 Assistive device utilized:  Rollator Level of assistance: Modified independence Comments: L foot slightly externally rotated, twists hip into more ER coming into knee flexion with toe off   TODAY'S TREATMENT:  DATE:  06/12/23 Nustep L6 x 5 min, UEs/LEs Supine  Heel slide x10, heel slide into march x10 L  Hip flexion AAROM to chest with towel 2x10 L  Hip abd/add AROM 2x10 L  SLR short range 2x5 L Sidelying  Clamshell 2x10 L  Reverse clamshell 2x10 L Prone  Hip ext with knee flexed 2x10 R&L  Knee flexion 2x10 Standing with counter support  Heel/toe raise 2x10  Mini squat 2x10  Heel tap forward/backward 2x10  Marching 2x10  06/08/23 Nustep L5 x 5 min, UEs/LEs Supine Heel slide 2x10 Hip ext iso into bed with knee extended 10x3" Hip flexor stretch 2x30" Hip abd/add AROM 2x10 SAQ 2x10 SLR short range with bolster under knee 2x10 Hip flexion AAROM with towel support 2x10 Clamshell green TB 2x10  Bridge green TB around knees 2x10 Standing  Hip pendulums on step fwd/bwd, side to side, circles clockwise & counter clockwise x30" each  One foot on 6" step rock forward/backward working on glute iso and weight bearing/weight shifting 2x10  Mini squat 2x10  06/01/23  See HEP  PATIENT EDUCATION:  Education details: Exam findings, POC, initial HEP Person educated: Patient Education method: Explanation, Demonstration, and Handouts Education comprehension: verbalized understanding, returned demonstration, and needs further education  HOME EXERCISE PROGRAM: Access Code: [email or web address] URL: [email or web address] Date: 06/12/2023 Prepared by: Vernon Prey April Kirstie Peri  Exercises - Supine Heel Slide  - 1 x daily - 7 x weekly - 3 sets - 10 reps - Supine Short Arc Quad  - 1 x daily - 7 x weekly - 2-3 sets - 10 reps - Supine Bridge with  Resistance Band  - 1 x daily - 7 x weekly - 2-3 sets - 10 reps - Hooklying Single Knee to Chest Stretch with Towel  - 1 x daily - 7 x weekly - 2 sets - 10 reps - Supine Hip Abduction AROM  - 1 x daily - 7 x weekly - 2 sets - 10 reps - Clamshell  - 1 x daily - 7 x weekly - 2 sets - 10 reps - Small Range Straight Leg Raise  - 1 x daily - 7 x weekly - 2 sets - 5 reps  ASSESSMENT:  CLINICAL IMPRESSION: Continued to work on pt's hip AROM and strength.  Improving hip abduction with less compensatory motions. Able to perform SLR ~2" off table today demonstrating increase in strength.  From eval: Patient is a 72 y.o. F who was seen today for physical therapy evaluation and treatment s/p L ORIF for acetabular fracture on 03/16/23. Assessment significant for L LE weakness, decreased ROM, pain, decreased tolerance to prolonged weight bearing, and decreased balance affecting standing, amb, and squatting limiting home and community tasks. Pt will benefit from PT to address these deficits to return to PLOF.   OBJECTIVE IMPAIRMENTS: Abnormal gait, decreased activity tolerance, decreased balance, decreased endurance, decreased mobility, difficulty walking, decreased ROM, decreased strength, hypomobility, increased fascial restrictions, impaired flexibility, improper body mechanics, postural dysfunction, and pain.    GOALS: Goals reviewed with patient? Yes  SHORT TERM GOALS: Target date: 06/29/2023  Pt will be ind with initial HEP Baseline: Goal status: INITIAL  2.  Pt will demo L = R hip abduction to improve ease with car transfers and bed mobility Baseline:  Goal status: INITIAL  3.  Pt will improve L hip flexion to at least 110 deg to improve car transfers and bed mobility Baseline:  Goal status: INITIAL  4. Pt will be able to perform  SLR to demo increased L LE strength Baseline:  Goal status: INITIAL   LONG TERM GOALS: Target date: 07/27/2023   Pt will be ind with progression and advancement  of HEP Baseline:  Goal status: INITIAL  2.  Pt will have improved 5x STS to </=13 sec to demo decreased fall risk and improved functional LE strength Baseline:  Goal status: INITIAL  3.  Pt will be able to amb at least 150' without RW for home amb Baseline:  Goal status: INITIAL  4.  Pt will report LEFS of >/=28.8% to demo MCID Baseline:  Goal status: INITIAL    PLAN:  PT FREQUENCY: 2x/week  PT DURATION: 8 [place]  PLANNED INTERVENTIONS: Therapeutic exercises, Therapeutic activity, Neuromuscular re-education, Balance training, Gait training, Patient/Family education, Self Care, Joint mobilization, Stair training, Aquatic Therapy, Dry Needling, Electrical stimulation, Spinal mobilization, Cryotherapy, Moist heat, Taping, Ionotophoresis 4mg /ml Dexamethasone, Manual therapy, and Re-evaluation  PLAN FOR NEXT SESSION: Assess response to HEP. Work on hip ROM, strengthening, weight bearing/balance.   Tahlia Deamer April Ma L Adrianna Dudas, PT 06/12/2023, 9:21 AM

## 2023-06-15 ENCOUNTER — Encounter (HOSPITAL_COMMUNITY): Payer: Self-pay | Admitting: Physical Therapy

## 2023-06-15 ENCOUNTER — Ambulatory Visit (HOSPITAL_COMMUNITY): Payer: Medicare HMO | Admitting: Physical Therapy

## 2023-06-15 DIAGNOSIS — M25552 Pain in left hip: Secondary | ICD-10-CM

## 2023-06-15 DIAGNOSIS — R262 Difficulty in walking, not elsewhere classified: Secondary | ICD-10-CM

## 2023-06-15 DIAGNOSIS — M25652 Stiffness of left hip, not elsewhere classified: Secondary | ICD-10-CM

## 2023-06-15 DIAGNOSIS — E785 Hyperlipidemia, unspecified: Secondary | ICD-10-CM | POA: Diagnosis not present

## 2023-06-15 DIAGNOSIS — M6281 Muscle weakness (generalized): Secondary | ICD-10-CM

## 2023-06-15 DIAGNOSIS — R7303 Prediabetes: Secondary | ICD-10-CM | POA: Diagnosis not present

## 2023-06-15 NOTE — Therapy (Signed)
OUTPATIENT PHYSICAL THERAPY TREATMENT   Patient Name: Kristina [name] MRN: [id number] DOB:December 10, 1950, 72 y.o., female Today's Date: 06/15/2023  END OF SESSION:  PT End of Session - 06/15/23 1105     Visit Number 4    Number of Visits 16    Date for PT Re-Evaluation 07/27/23    Authorization Type [employer] Medicare    Progress Note Due on Visit 10    PT Start Time 1105    PT Stop Time 1145    PT Time Calculation (min) 40 min    Activity Tolerance Patient tolerated treatment well    Behavior During Therapy WFL for tasks assessed/performed                Past Medical History:  Diagnosis Date   Acute respiratory distress syndrome (ARDS) due to COVID-19 virus (HCC) 09/2019   Chronic combined systolic and diastolic CHF (congestive heart failure) (HCC)    Diabetes mellitus, type 2 (HCC)    GERD (gastroesophageal reflux disease)    Hyperlipidemia    Hypertension    Past Surgical History:  Procedure Laterality Date   COLONOSCOPY N/A 12/21/2016   Procedure: COLONOSCOPY;  Surgeon: Malissa Hippo, MD;  [facility]: [facility];  Service: Endoscopy;  Laterality: N/A;  930   NO PAST SURGERIES     ORIF ACETABULAR FRACTURE Left 03/16/2023   Procedure: OPEN REDUCTION INTERNAL FIXATION (ORIF) ACETABULAR FRACTURE STOPPA APPROACH;  Surgeon: Roby Lofts, MD;  Location: [facility];  Service: Orthopedics;  Laterality: Left;   Patient Active Problem List   Diagnosis Date Noted   Chronic constipation 05/10/2023   Hemorrhoids 05/10/2023   Closed fracture of right acetabulum (HCC) 05/02/2023   Acetabular fracture (HCC) 03/14/2023   Candidiasis of skin 02/12/2023   OP (osteoporosis) 01/22/2023   Proteinuria 06/15/2022   Sleep apnea 09/08/2021   Congestive heart failure (HCC) 07/29/2021   Hyperglycemia due to type 2 diabetes mellitus (HCC) 05/04/2021   Chronic systolic heart failure (HCC) 04/09/2021   Hypoxia    Acute hypoxemic respiratory failure (HCC) 04/05/2021   Dyspnea 04/05/2021    Tremor 04/05/2021   Impaired fasting glucose 01/25/2021   Gastroesophageal reflux disease without esophagitis 01/25/2021   Mixed hyperlipidemia 01/25/2021   Morbid obesity (HCC) 01/25/2021   Prediabetes 01/25/2021   Tachycardia 01/25/2021   Pain in left knee 05/20/2020   Essential hypertension 10/08/2019   Uncontrolled diabetes mellitus 10/08/2019   UTI (urinary tract infection) 10/08/2019   Thrush 10/08/2019   Pneumonia due to COVID-19 virus 10/08/2019   Chronic respiratory failure with hypoxia (HCC) 10/02/2019   Acute respiratory distress syndrome (ARDS) due to COVID-19 virus (HCC) 10/02/2019   Pain in joint of right shoulder 07/15/2018   Pain in right foot 07/15/2018   Special screening for malignant neoplasms, colon 11/06/2016    PCP: Benita Stabile, MD  REFERRING PROVIDER: Dayna Ramus, FNP  REFERRING DIAG: [id number] (ICD-10-CM) - Unspecified fracture of left acetabulum, subsequent encounter for fracture with routine healing  THERAPY DIAG:  Pain in left hip  Stiffness of left hip, not elsewhere classified  Muscle weakness (generalized)  Difficulty in walking, not elsewhere classified  Rationale for Evaluation and Treatment: Rehabilitation  ONSET DATE: 03/16/23 ORIF  SUBJECTIVE:   SUBJECTIVE STATEMENT: Pt states hip is doing pretty good. No new complaints.   PERTINENT HISTORY: Per H&P on 05/10/23: 6/19 fall that caused a closed nondisplaced fracture of the acetabulum and underwent ORIF 6/21. she was d/c on 6/25 to a SNF for PT/OT  evaluation she was d/c with home PT which is now done but pt is doing home exercises.  PAIN:  Are you having pain? Yes: NPRS scale: 1 currently, at worst 7 or 8 with increased pressure/10 Pain location: Sometimes in groin and can radiates down side of L LE Pain description: Throbbing Aggravating factors: abducting/externally rotate, prolonged pressure Relieving factors: Resting  PRECAUTIONS: Fall  RED FLAGS: None   WEIGHT  BEARING RESTRICTIONS: No  FALLS:  Has patient fallen in last 6 months? No  LIVING ENVIRONMENT: Lives with: lives with their spouse Lives in: House/apartment Stairs: Yes: External: 3 steps; has a ramp Has following equipment at home: Walker - 4 wheeled, w/c, bedside commode  OCCUPATION: Retired -- likes to be outside (can normally ride the lawnmower)  PLOF: Independent  PATIENT GOALS: Return to walking without a/d  NEXT MD VISIT: 07/19/23  OBJECTIVE:   DIAGNOSTIC FINDINGS:  6/21 pelvis x-ray IMPRESSION: Status post surgical internal fixation of left acetabular fracture. Continued presence of mildly displaced left inferior pubic ramus fracture.  PATIENT SURVEYS:  Lower Extremity Functional Score: 15 / 80 = 18.8 %  EDEMA:  "L foot stays swollen all the time since surgery"  MUSCLE LENGTH: Hamstrings: did not assess Thomas test: Right 10 deg; Left 0 deg  POSTURE: rounded shoulders and flexed trunk   PALPATION: TTP L Hip  LOWER EXTREMITY ROM:  Active ROM Right eval Left eval Left 06/12/23   Hip flexion 115 100 95 in supine  Hip extension 10 0   Hip abduction 20 15   Hip adduction     Hip internal rotation     Hip external rotation     Knee flexion     Knee extension     Ankle dorsiflexion     Ankle plantarflexion     Ankle inversion     Ankle eversion      (Blank rows = not tested)  LOWER EXTREMITY MMT:  MMT Right eval Left eval  Hip flexion 5 4  Hip extension 4 3+  Hip abduction 4 3+  Hip adduction    Hip internal rotation    Hip external rotation    Knee flexion 5 5  Knee extension 5 Unable to perform SLR  Ankle dorsiflexion    Ankle plantarflexion    Ankle inversion    Ankle eversion     (Blank rows = not tested)  LOWER EXTREMITY SPECIAL TESTS:  Did not assess  FUNCTIONAL TESTS:  5 times sit to stand: 17.4 sec (with UE support) Gait speed 0.58 m/sec   GAIT: Distance walked: 200'x2 Assistive device utilized:  Rollator Level of  assistance: Modified independence Comments: L foot slightly externally rotated, twists hip into more ER coming into knee flexion with toe off   TODAY'S TREATMENT:  DATE:  06/15/23 Seated  Hamstring stretch x 30 sec L  Hip flexor stretch x 30 sec L  Piriformis stretch x 30 secL Supine  Figure 4 stretch x30 sec L Marching 2x10 L  Foot on pball, knee bent, hip IR 2x10 L   Foot on pball, knee flexion/ext x10 L working on keeping hip from ER  Bridging x10 bilat, staggered feet 2x10 with L foot back more Sidelying  Clamshell 2x10 R&L Squat touch chair 2x10 Gait training 1/2 lap around gym with SPC, focusing on coordinating a/d with steps, decreasing BOS and maintaining symmetrical steps. CGA req due to 2 minor LOBs (pt able to self correct)    06/12/23 Nustep L6 x 5 min, UEs/LEs Supine  Heel slide x10, heel slide into march x10 L  Hip flexion AAROM to chest with towel 2x10 L  Hip abd/add AROM 2x10 L  SLR short range 2x5 L Sidelying  Clamshell 2x10 L  Reverse clamshell 2x10 L Prone  Hip ext with knee flexed 2x10 R&L  Knee flexion 2x10 Standing with counter support  Heel/toe raise 2x10  Mini squat 2x10  Heel tap forward/backward 2x10  Marching 2x10  06/08/23 Nustep L5 x 5 min, UEs/LEs Supine Heel slide 2x10 Hip ext iso into bed with knee extended 10x3" Hip flexor stretch 2x30" Hip abd/add AROM 2x10 SAQ 2x10 SLR short range with bolster under knee 2x10 Hip flexion AAROM with towel support 2x10 Clamshell green TB 2x10  Bridge green TB around knees 2x10 Standing  Hip pendulums on step fwd/bwd, side to side, circles clockwise & counter clockwise x30" each  One foot on 6" step rock forward/backward working on glute iso and weight bearing/weight shifting 2x10  Mini squat 2x10  06/01/23  See HEP  PATIENT EDUCATION:  Education details: Exam findings,  POC, initial HEP Person educated: Patient Education method: Explanation, Demonstration, and Handouts Education comprehension: verbalized understanding, returned demonstration, and needs further education  HOME EXERCISE PROGRAM: Access Code: [email or web address] URL: [email or web address] Date: 06/12/2023 Prepared by: Vernon Prey April Kirstie Peri  Exercises - Supine Heel Slide  - 1 x daily - 7 x weekly - 3 sets - 10 reps - Supine Short Arc Quad  - 1 x daily - 7 x weekly - 2-3 sets - 10 reps - Supine Bridge with Resistance Band  - 1 x daily - 7 x weekly - 2-3 sets - 10 reps - Hooklying Single Knee to Chest Stretch with Towel  - 1 x daily - 7 x weekly - 2 sets - 10 reps - Supine Hip Abduction AROM  - 1 x daily - 7 x weekly - 2 sets - 10 reps - Clamshell  - 1 x daily - 7 x weekly - 2 sets - 10 reps - Small Range Straight Leg Raise  - 1 x daily - 7 x weekly - 2 sets - 5 reps  ASSESSMENT:  CLINICAL IMPRESSION: Pt with improving AROM and overall pain. Remains most tight with figure 4 stretch. She is slowly meeting her STGs. She is now able to perform SLR and reports improvements with her car transfers. Pt able to advance to using [facility] this session.   From eval: Patient is a 72 y.o. F who was seen today for physical therapy evaluation and treatment s/p L ORIF for acetabular fracture on 03/16/23. Assessment significant for L LE weakness, decreased ROM, pain, decreased tolerance to prolonged weight bearing, and decreased balance affecting standing, amb, and squatting limiting home and community tasks. Pt will benefit from  PT to address these deficits to return to PLOF.   OBJECTIVE IMPAIRMENTS: Abnormal gait, decreased activity tolerance, decreased balance, decreased endurance, decreased mobility, difficulty walking, decreased ROM, decreased strength, hypomobility, increased fascial restrictions, impaired flexibility, improper body mechanics, postural dysfunction, and pain.    GOALS: Goals reviewed  with patient? Yes  SHORT TERM GOALS: Target date: 06/29/2023  Pt will be ind with initial HEP Baseline: Goal status: INITIAL  2.  Pt will demo L = R hip abduction to improve ease with car transfers and bed mobility Baseline:  Goal status: INITIAL  3.  Pt will improve L hip flexion to at least 110 deg to improve car transfers and bed mobility Baseline:  Goal status: MET  4. Pt will be able to perform SLR to demo increased L LE strength Baseline:  Goal status: MET   LONG TERM GOALS: Target date: 07/27/2023   Pt will be ind with progression and advancement of HEP Baseline:  Goal status: INITIAL  2.  Pt will have improved 5x STS to </=13 sec to demo decreased fall risk and improved functional LE strength Baseline:  Goal status: INITIAL  3.  Pt will be able to amb at least 150' without RW for home amb Baseline:  Goal status: INITIAL  4.  Pt will report LEFS of >/=28.8% to demo MCID Baseline:  Goal status: INITIAL    PLAN:  PT FREQUENCY: 2x/week  PT DURATION: 8 [place]  PLANNED INTERVENTIONS: Therapeutic exercises, Therapeutic activity, Neuromuscular re-education, Balance training, Gait training, Patient/Family education, Self Care, Joint mobilization, Stair training, Aquatic Therapy, Dry Needling, Electrical stimulation, Spinal mobilization, Cryotherapy, Moist heat, Taping, Ionotophoresis 4mg /ml Dexamethasone, Manual therapy, and Re-evaluation  PLAN FOR NEXT SESSION: Assess response to HEP. Work on hip ROM, strengthening, weight bearing/balance.   Jemar Paulsen April Ma L Nioma Mccubbins, PT 06/15/2023, 11:06 AM

## 2023-06-18 ENCOUNTER — Ambulatory Visit: Payer: Self-pay | Admitting: *Deleted

## 2023-06-18 NOTE — Patient Outreach (Signed)
Care Coordination   Follow Up Visit Note   06/18/2023 Name: Kristina [name] MRN: [id number] DOB: 1951-04-05  Kristina [name] is a 72 y.o. year old female who sees Margo Aye, Kathleene Hazel, MD for primary care. I spoke with  Bobbe Medico Bruning by phone today.  What matters to the patients health and wellness today?  Swelling of legs, OPRC, NO UTI or urinary symptoms, No falls since June 2024   She confirms she is wearing her compression hose, elevating her legs and being more mobile in her outpatient therapies  She continues to take her diuretic She voices not understanding the edema of her feet and wants to speak with her pcp. She voiced understanding of this discussion with her pcp and lab work     Goals Addressed             This Visit's Progress    COMPLETED: Patient will improve mobility- care ccordination services   On track     Patient Goals for Care Coordination:  Continue provider established plan of care- attend upcoming outpatient therapy sessions- Goal complete Becoming more mobile adherence to all scheduled medical appointments Aware of the importance of reporting any/all new or worsening symptoms or management to provider Continue to take prescribed pharmacological and nonpharmacological medicines as ordered Report any social determinant of health barriers Review any education information provided and call for questions   Interventions Today    Flowsheet Row Most Recent Value  Chronic Disease   Chronic disease during today's visit Other  [Swelling of legs, OPRC, NO UTI or urinary symptoms, No falls since June 2024]  General Interventions   General Interventions Discussed/Reviewed General Interventions Reviewed, Doctor Visits  [discussed upcoming visits for the next 2 [place]]  Doctor Visits Discussed/Reviewed Doctor Visits Reviewed, Specialist, PCP  PCP/Specialist Visits Compliance with follow-up visit  Exercise Interventions   Exercise Discussed/Reviewed Exercise Reviewed,  Physical Activity  Physical Activity Discussed/Reviewed Physical Activity Reviewed, Types of exercise  [attending Outpatient rehab and finds it very beneficial]  Education Interventions   Education Provided Provided Education  Provided Verbal Education On Other, Foot Care, Labs  Labs Reviewed Kidney Function  Mental Health Interventions   Mental Health Discussed/Reviewed Mental Health Reviewed, Coping Strategies  Nutrition Interventions   Nutrition Discussed/Reviewed Nutrition Reviewed, Decreasing salt  Safety Interventions   Safety Discussed/Reviewed Safety Reviewed, Fall Risk  [confirned no falls since last outreach nor since June 2024]              SDOH assessments and interventions completed:  No     Care Coordination Interventions:  Yes, provided   Follow up plan: Follow up call scheduled for 07/18/23    Encounter Outcome:  Patient Visit Completed   Cala Bradford L. Noelle Penner, RN, BSN, CCM, Care Management Coordinator [phone]

## 2023-06-18 NOTE — Patient Instructions (Signed)
Visit Information  Thank you for taking time to visit with me today. Please don't hesitate to contact me if I can be of assistance to you.   Following are the goals we discussed today:   Goals Addressed             This Visit's Progress    COMPLETED: Patient will improve mobility- care ccordination services   On track     Patient Goals for Care Coordination:  Continue provider established plan of care- attend upcoming outpatient therapy sessions- Goal complete Becoming more mobile adherence to all scheduled medical appointments Aware of the importance of reporting any/all new or worsening symptoms or management to provider Continue to take prescribed pharmacological and nonpharmacological medicines as ordered Report any social determinant of health barriers Review any education information provided and call for questions   Interventions Today    Flowsheet Row Most Recent Value  Chronic Disease   Chronic disease during today's visit Other  [Swelling of legs, OPRC, NO UTI or urinary symptoms, No falls since June 2024]  General Interventions   General Interventions Discussed/Reviewed General Interventions Reviewed, Doctor Visits  [discussed upcoming visits for the next 2 [place]]  Doctor Visits Discussed/Reviewed Doctor Visits Reviewed, Specialist, PCP  PCP/Specialist Visits Compliance with follow-up visit  Exercise Interventions   Exercise Discussed/Reviewed Exercise Reviewed, Physical Activity  Physical Activity Discussed/Reviewed Physical Activity Reviewed, Types of exercise  [attending Outpatient rehab and finds it very beneficial]  Education Interventions   Education Provided Provided Education  Provided Verbal Education On Other, Foot Care, Labs  Labs Reviewed Kidney Function  Mental Health Interventions   Mental Health Discussed/Reviewed Mental Health Reviewed, Coping Strategies  Nutrition Interventions   Nutrition Discussed/Reviewed Nutrition Reviewed, Decreasing salt   Safety Interventions   Safety Discussed/Reviewed Safety Reviewed, Fall Risk  [confirned no falls since last outreach nor since June 2024]              Our next appointment is by telephone on 07/18/23 at 0930  Please call the care guide team at [phone] if you need to cancel or reschedule your appointment.   If you are experiencing a Mental Health or Behavioral Health Crisis or need someone to talk to, please call the Suicide and Crisis Lifeline: 988 call the [place] National Suicide Prevention Lifeline: 804-467-3899 or TTY: 928-241-1286 TTY 684-028-3645) to talk to a trained counselor call 1-800-273-TALK (toll free, 24 hour hotline) call the [facility]: [phone] call 911   No computer access, no preference for copy of AVS      The patient has been provided with contact information for the care management team and has been advised to call with any health related questions or concerns.    Odaly Peri L. Noelle Penner, RN, BSN, CCM, Care Management Coordinator [phone]

## 2023-06-19 ENCOUNTER — Ambulatory Visit (HOSPITAL_COMMUNITY): Payer: Medicare HMO | Admitting: Physical Therapy

## 2023-06-19 DIAGNOSIS — R262 Difficulty in walking, not elsewhere classified: Secondary | ICD-10-CM | POA: Diagnosis not present

## 2023-06-19 DIAGNOSIS — M25552 Pain in left hip: Secondary | ICD-10-CM

## 2023-06-19 DIAGNOSIS — M25652 Stiffness of left hip, not elsewhere classified: Secondary | ICD-10-CM

## 2023-06-19 DIAGNOSIS — M6281 Muscle weakness (generalized): Secondary | ICD-10-CM

## 2023-06-19 NOTE — Therapy (Signed)
OUTPATIENT PHYSICAL THERAPY TREATMENT   Patient Name: SEMIRA SIEGMANN MRN: [id number] DOB:Nov 06, 1950, 72 y.o., female Today's Date: 06/19/2023  END OF SESSION:  PT End of Session - 06/19/23 1138     Visit Number 5    Number of Visits 16    Date for PT Re-Evaluation 07/27/23    Authorization Type [employer]    Progress Note Due on Visit 10    PT Start Time 1140    PT Stop Time 1220    PT Time Calculation (min) 40 min    Activity Tolerance Patient tolerated treatment well    Behavior During Therapy WFL for tasks assessed/performed                Past Medical History:  Diagnosis Date   Acute respiratory distress syndrome (ARDS) due to COVID-19 virus (HCC) 09/2019   Chronic combined systolic and diastolic CHF (congestive heart failure) (HCC)    Diabetes mellitus, type 2 (HCC)    GERD (gastroesophageal reflux disease)    Hyperlipidemia    Hypertension    Past Surgical History:  Procedure Laterality Date   COLONOSCOPY N/A 12/21/2016   Procedure: COLONOSCOPY;  Surgeon: Malissa Hippo, MD;  Location: [facility];  Service: Endoscopy;  Laterality: N/A;  930   NO PAST SURGERIES     ORIF ACETABULAR FRACTURE Left 03/16/2023   Procedure: OPEN REDUCTION INTERNAL FIXATION (ORIF) ACETABULAR FRACTURE STOPPA APPROACH;  Surgeon: Roby Lofts, MD;  Location: [facility] OR;  Service: Orthopedics;  Laterality: Left;   Patient Active Problem List   Diagnosis Date Noted   Chronic constipation 05/10/2023   Hemorrhoids 05/10/2023   Closed fracture of right acetabulum (HCC) 05/02/2023   Acetabular fracture (HCC) 03/14/2023   Candidiasis of skin 02/12/2023   OP (osteoporosis) 01/22/2023   Proteinuria 06/15/2022   Sleep apnea 09/08/2021   Congestive heart failure (HCC) 07/29/2021   Hyperglycemia due to type 2 diabetes mellitus (HCC) 05/04/2021   Chronic systolic heart failure (HCC) 04/09/2021   Hypoxia    Acute hypoxemic respiratory failure (HCC) 04/05/2021   Dyspnea 04/05/2021    Tremor 04/05/2021   Impaired fasting glucose 01/25/2021   Gastroesophageal reflux disease without esophagitis 01/25/2021   Mixed hyperlipidemia 01/25/2021   Morbid obesity (HCC) 01/25/2021   Prediabetes 01/25/2021   Tachycardia 01/25/2021   Pain in left knee 05/20/2020   Essential hypertension 10/08/2019   Uncontrolled diabetes mellitus 10/08/2019   UTI (urinary tract infection) 10/08/2019   Thrush 10/08/2019   Pneumonia due to COVID-19 virus 10/08/2019   Chronic respiratory failure with hypoxia (HCC) 10/02/2019   Acute respiratory distress syndrome (ARDS) due to COVID-19 virus (HCC) 10/02/2019   Pain in joint of right shoulder 07/15/2018   Pain in right foot 07/15/2018   Special screening for malignant neoplasms, colon 11/06/2016    PCP: Benita Stabile, MD  REFERRING PROVIDER: Dayna Ramus, FNP  REFERRING DIAG: [phone] (ICD-10-CM) - Unspecified fracture of left acetabulum, subsequent encounter for fracture with routine healing  THERAPY DIAG:  Pain in left hip  Stiffness of left hip, not elsewhere classified  Muscle weakness (generalized)  Difficulty in walking, not elsewhere classified  Rationale for Evaluation and Treatment: Rehabilitation  ONSET DATE: 03/16/23 ORIF  SUBJECTIVE:   SUBJECTIVE STATEMENT: Pt states she is going without AD some at home.  States she is able to get out of the tub independently now.  Progressing well.   PERTINENT HISTORY: Per H&P on 05/10/23: 6/19 fall that caused a closed nondisplaced fracture of  the acetabulum and underwent ORIF 6/21. she was d/c on 6/25 to a SNF for PT/OT evaluation she was d/c with home PT which is now done but pt is doing home exercises.  PAIN:  Are you having pain? Yes: NPRS scale: 1 currently, at worst 7 or 8 with increased pressure/10 Pain location: Sometimes in groin and can radiates down side of L LE Pain description: Throbbing Aggravating factors: abducting/externally rotate, prolonged pressure Relieving  factors: Resting  PRECAUTIONS: Fall  RED FLAGS: None   WEIGHT BEARING RESTRICTIONS: No  FALLS:  Has patient fallen in last 6 months? No  LIVING ENVIRONMENT: Lives with: lives with their spouse Lives in: House/apartment Stairs: Yes: External: 3 steps; has a ramp Has following equipment at home: Walker - 4 wheeled, w/c, bedside commode  OCCUPATION: Retired -- likes to be outside (can normally ride the lawnmower)  PLOF: Independent  PATIENT GOALS: Return to walking without a/d  NEXT MD VISIT: 07/19/23  OBJECTIVE:   DIAGNOSTIC FINDINGS:  6/21 pelvis x-ray IMPRESSION: Status post surgical internal fixation of left acetabular fracture. Continued presence of mildly displaced left inferior pubic ramus fracture.  PATIENT SURVEYS:  Lower Extremity Functional Score: 15 / 80 = 18.8 %  EDEMA:  "L foot stays swollen all the time since surgery"  MUSCLE LENGTH: Hamstrings: did not assess Thomas test: Right 10 deg; Left 0 deg  POSTURE: rounded shoulders and flexed trunk   PALPATION: TTP L Hip  LOWER EXTREMITY ROM:  Active ROM Right eval Left eval Left 06/12/23   Hip flexion 115 100 95 in supine  Hip extension 10 0   Hip abduction 20 15   Hip adduction     Hip internal rotation     Hip external rotation     Knee flexion     Knee extension     Ankle dorsiflexion     Ankle plantarflexion     Ankle inversion     Ankle eversion      (Blank rows = not tested)  LOWER EXTREMITY MMT:  MMT Right eval Left eval  Hip flexion 5 4  Hip extension 4 3+  Hip abduction 4 3+  Hip adduction    Hip internal rotation    Hip external rotation    Knee flexion 5 5  Knee extension 5 Unable to perform SLR  Ankle dorsiflexion    Ankle plantarflexion    Ankle inversion    Ankle eversion     (Blank rows = not tested)  LOWER EXTREMITY SPECIAL TESTS:  Did not assess  FUNCTIONAL TESTS:  5 times sit to stand: 17.4 sec (with UE support) Gait speed 0.58  m/sec   GAIT: Distance walked: 200'x2 Assistive device utilized:  Rollator Level of assistance: Modified independence Comments: L foot slightly externally rotated, twists hip into more ER coming into knee flexion with toe off   TODAY'S TREATMENT:  DATE:  06/19/23 Nustep seat  5 minutes level 5 UE/LE Standing with counter support  Heel/toe raise 20X  Mini squat 20X  Hip abduction 20X each  Hip extension 20X each  High march holds 20X each  4" forward step ups 10X Lt LE  4" lateral step ups 10X Lt LE  Stair negotiation 4" 2RT with bil HR's reciprocally  Tandem stance 2X30" each with intermittent HHA  Vectors 5X3" holds with 1 UE assist   06/15/23 Seated  Hamstring stretch x 30 sec L  Hip flexor stretch x 30 sec L  Piriformis stretch x 30 secL Supine  Figure 4 stretch x30 sec L Marching 2x10 L  Foot on pball, knee bent, hip IR 2x10 L   Foot on pball, knee flexion/ext x10 L working on keeping hip from ER  Bridging x10 bilat, staggered feet 2x10 with L foot back more Sidelying  Clamshell 2x10 R&L Squat touch chair 2x10 Gait training 1/2 lap around gym with SPC, focusing on coordinating a/d with steps, decreasing BOS and maintaining symmetrical steps. CGA req due to 2 minor LOBs (pt able to self correct)    06/12/23 Nustep L6 x 5 min, UEs/LEs Supine  Heel slide x10, heel slide into march x10 L  Hip flexion AAROM to chest with towel 2x10 L  Hip abd/add AROM 2x10 L  SLR short range 2x5 L Sidelying  Clamshell 2x10 L  Reverse clamshell 2x10 L Prone  Hip ext with knee flexed 2x10 R&L  Knee flexion 2x10 Standing with counter support  Heel/toe raise 2x10  Mini squat 2x10  Heel tap forward/backward 2x10  Marching 2x10  06/08/23 Nustep L5 x 5 min, UEs/LEs Supine Heel slide 2x10 Hip ext iso into bed with knee extended 10x3" Hip flexor stretch  2x30" Hip abd/add AROM 2x10 SAQ 2x10 SLR short range with bolster under knee 2x10 Hip flexion AAROM with towel support 2x10 Clamshell green TB 2x10  Bridge green TB around knees 2x10 Standing  Hip pendulums on step fwd/bwd, side to side, circles clockwise & counter clockwise x30" each  One foot on 6" step rock forward/backward working on glute iso and weight bearing/weight shifting 2x10  Mini squat 2x10  06/01/23  See HEP  PATIENT EDUCATION:  Education details: Exam findings, POC, initial HEP Person educated: Patient Education method: Explanation, Demonstration, and Handouts Education comprehension: verbalized understanding, returned demonstration, and needs further education  HOME EXERCISE PROGRAM: Access Code: [email or web address] URL: [email or web address] Date: 06/12/2023 Prepared by: Vernon Prey April Kirstie Peri  Exercises - Supine Heel Slide  - 1 x daily - 7 x weekly - 3 sets - 10 reps - Supine Short Arc Quad  - 1 x daily - 7 x weekly - 2-3 sets - 10 reps - Supine Bridge with Resistance Band  - 1 x daily - 7 x weekly - 2-3 sets - 10 reps - Hooklying Single Knee to Chest Stretch with Towel  - 1 x daily - 7 x weekly - 2 sets - 10 reps - Supine Hip Abduction AROM  - 1 x daily - 7 x weekly - 2 sets - 10 reps - Clamshell  - 1 x daily - 7 x weekly - 2 sets - 10 reps - Small Range Straight Leg Raise  - 1 x daily - 7 x weekly - 2 sets - 5 reps  ASSESSMENT:  CLINICAL IMPRESSION: Focus on LE strength and stability today.  Pt still akward with gait using SPC completing slowly/cautious but with good stability overall.  Began forward and lateral step ups.  Able to complete stair negotiation reciprocally with use of bil HR's.  Static balance remains challenging with addition of vectors added to further improve glute strength/staiblity.  Pt is now able to get out of her tub and completing all other transfers without difficulty. Pt will continue to benefit from skilled therapy to progress  towards goals.   From eval: Patient is a 72 y.o. F who was seen today for physical therapy evaluation and treatment s/p L ORIF for acetabular fracture on 03/16/23. Assessment significant for L LE weakness, decreased ROM, pain, decreased tolerance to prolonged weight bearing, and decreased balance affecting standing, amb, and squatting limiting home and community tasks. Pt will benefit from PT to address these deficits to return to PLOF.   OBJECTIVE IMPAIRMENTS: Abnormal gait, decreased activity tolerance, decreased balance, decreased endurance, decreased mobility, difficulty walking, decreased ROM, decreased strength, hypomobility, increased fascial restrictions, impaired flexibility, improper body mechanics, postural dysfunction, and pain.    GOALS: Goals reviewed with patient? Yes  SHORT TERM GOALS: Target date: 06/29/2023  Pt will be ind with initial HEP Baseline: Goal status: INITIAL  2.  Pt will demo L = R hip abduction to improve ease with car transfers and bed mobility Baseline:  Goal status: INITIAL  3.  Pt will improve L hip flexion to at least 110 deg to improve car transfers and bed mobility Baseline:  Goal status: MET  4. Pt will be able to perform SLR to demo increased L LE strength Baseline:  Goal status: MET   LONG TERM GOALS: Target date: 07/27/2023   Pt will be ind with progression and advancement of HEP Baseline:  Goal status: INITIAL  2.  Pt will have improved 5x STS to </=13 sec to demo decreased fall risk and improved functional LE strength Baseline:  Goal status: INITIAL  3.  Pt will be able to amb at least 150' without RW for home amb Baseline:  Goal status: INITIAL  4.  Pt will report LEFS of >/=28.8% to demo MCID Baseline:  Goal status: INITIAL    PLAN:  PT FREQUENCY: 2x/week  PT DURATION: 8 Ouzts  PLANNED INTERVENTIONS: Therapeutic exercises, Therapeutic activity, Neuromuscular re-education, Balance training, Gait training, Patient/Family  education, Self Care, Joint mobilization, Stair training, Aquatic Therapy, Dry Needling, Electrical stimulation, Spinal mobilization, Cryotherapy, Moist heat, Taping, Ionotophoresis 4mg /ml Dexamethasone, Manual therapy, and Re-evaluation  PLAN FOR NEXT SESSION: Assess response to HEP. Work on hip ROM, strengthening, weight bearing/balance.   Bascom Levels, Zarif Rathje B, PTA 06/19/2023, 1:01 PM

## 2023-06-21 ENCOUNTER — Other Ambulatory Visit: Payer: Self-pay | Admitting: *Deleted

## 2023-06-21 DIAGNOSIS — G4730 Sleep apnea, unspecified: Secondary | ICD-10-CM | POA: Diagnosis not present

## 2023-06-21 DIAGNOSIS — S32401D Unspecified fracture of right acetabulum, subsequent encounter for fracture with routine healing: Secondary | ICD-10-CM | POA: Diagnosis not present

## 2023-06-21 DIAGNOSIS — R809 Proteinuria, unspecified: Secondary | ICD-10-CM | POA: Diagnosis not present

## 2023-06-21 DIAGNOSIS — E870 Hyperosmolality and hypernatremia: Secondary | ICD-10-CM | POA: Diagnosis not present

## 2023-06-21 DIAGNOSIS — K649 Unspecified hemorrhoids: Secondary | ICD-10-CM | POA: Diagnosis not present

## 2023-06-21 DIAGNOSIS — I509 Heart failure, unspecified: Secondary | ICD-10-CM | POA: Diagnosis not present

## 2023-06-21 DIAGNOSIS — K5909 Other constipation: Secondary | ICD-10-CM | POA: Diagnosis not present

## 2023-06-21 DIAGNOSIS — K219 Gastro-esophageal reflux disease without esophagitis: Secondary | ICD-10-CM | POA: Diagnosis not present

## 2023-06-21 DIAGNOSIS — M810 Age-related osteoporosis without current pathological fracture: Secondary | ICD-10-CM | POA: Diagnosis not present

## 2023-06-21 DIAGNOSIS — I110 Hypertensive heart disease with heart failure: Secondary | ICD-10-CM | POA: Diagnosis not present

## 2023-06-21 DIAGNOSIS — R7303 Prediabetes: Secondary | ICD-10-CM | POA: Diagnosis not present

## 2023-06-21 DIAGNOSIS — E785 Hyperlipidemia, unspecified: Secondary | ICD-10-CM | POA: Diagnosis not present

## 2023-06-21 MED ORDER — CARVEDILOL 6.25 MG PO TABS: 6.2500 mg | ORAL_TABLET | Freq: Two times a day (BID) | ORAL | 0 refills | Status: DC

## 2023-06-22 ENCOUNTER — Encounter (HOSPITAL_COMMUNITY): Payer: Medicare HMO | Admitting: Physical Therapy

## 2023-06-26 ENCOUNTER — Ambulatory Visit (HOSPITAL_COMMUNITY): Payer: Medicare HMO | Attending: Family Medicine | Admitting: Physical Therapy

## 2023-06-26 ENCOUNTER — Encounter (HOSPITAL_COMMUNITY): Payer: Self-pay | Admitting: Physical Therapy

## 2023-06-26 DIAGNOSIS — R262 Difficulty in walking, not elsewhere classified: Secondary | ICD-10-CM | POA: Insufficient documentation

## 2023-06-26 DIAGNOSIS — M6281 Muscle weakness (generalized): Secondary | ICD-10-CM | POA: Diagnosis not present

## 2023-06-26 DIAGNOSIS — M25552 Pain in left hip: Secondary | ICD-10-CM | POA: Diagnosis not present

## 2023-06-26 DIAGNOSIS — M25652 Stiffness of left hip, not elsewhere classified: Secondary | ICD-10-CM | POA: Diagnosis not present

## 2023-06-26 NOTE — Therapy (Signed)
OUTPATIENT PHYSICAL THERAPY TREATMENT   Patient Name: KAYLONNIE STURGES MRN: [id number] DOB:[birth year], 72 y.o., female Today's Date: 06/26/2023  END OF SESSION:  PT End of Session - 06/26/23 1015     Visit Number 6    Number of Visits 16    Date for PT Re-Evaluation 07/27/23    Authorization Type [employer]    Progress Note Due on Visit 10    PT Start Time 1015    PT Stop Time 1100    PT Time Calculation (min) 45 min    Activity Tolerance Patient tolerated treatment well    Behavior During Therapy WFL for tasks assessed/performed                Past Medical History:  Diagnosis Date   Acute respiratory distress syndrome (ARDS) due to COVID-19 virus (HCC) 09/2019   Chronic combined systolic and diastolic CHF (congestive heart failure) (HCC)    Diabetes mellitus, type 2 (HCC)    GERD (gastroesophageal reflux disease)    Hyperlipidemia    Hypertension    Past Surgical History:  Procedure Laterality Date   COLONOSCOPY N/A 12/21/2016   Procedure: COLONOSCOPY;  Surgeon: Malissa Hippo, MD;  Location: [facility];  Service: Endoscopy;  Laterality: N/A;  930   NO PAST SURGERIES     ORIF ACETABULAR FRACTURE Left 03/16/2023   Procedure: OPEN REDUCTION INTERNAL FIXATION (ORIF) ACETABULAR FRACTURE STOPPA APPROACH;  Surgeon: Roby Lofts, MD;  Location: [facility] OR;  Service: Orthopedics;  Laterality: Left;   Patient Active Problem List   Diagnosis Date Noted   Chronic constipation 05/10/2023   Hemorrhoids 05/10/2023   Closed fracture of right acetabulum (HCC) 05/02/2023   Acetabular fracture (HCC) 03/14/2023   Candidiasis of skin 02/12/2023   OP (osteoporosis) 01/22/2023   Proteinuria 06/15/2022   Sleep apnea 09/08/2021   Congestive heart failure (HCC) 07/29/2021   Hyperglycemia due to type 2 diabetes mellitus (HCC) 05/04/2021   Chronic systolic heart failure (HCC) 04/09/2021   Hypoxia    Acute hypoxemic respiratory failure (HCC) 04/05/2021   Dyspnea 04/05/2021    Tremor 04/05/2021   Impaired fasting glucose 01/25/2021   Gastroesophageal reflux disease without esophagitis 01/25/2021   Mixed hyperlipidemia 01/25/2021   Morbid obesity (HCC) 01/25/2021   Prediabetes 01/25/2021   Tachycardia 01/25/2021   Pain in left knee 05/20/2020   Essential hypertension 10/08/2019   Uncontrolled diabetes mellitus 10/08/2019   UTI (urinary tract infection) 10/08/2019   Thrush 10/08/2019   Pneumonia due to COVID-19 virus 10/08/2019   Chronic respiratory failure with hypoxia (HCC) 10/02/2019   Acute respiratory distress syndrome (ARDS) due to COVID-19 virus (HCC) 10/02/2019   Pain in joint of right shoulder 07/15/2018   Pain in right foot 07/15/2018   Special screening for malignant neoplasms, colon 11/06/2016    PCP: Benita Stabile, MD  REFERRING PROVIDER: Dayna Ramus, FNP  REFERRING DIAG: [phone] (ICD-10-CM) - Unspecified fracture of left acetabulum, subsequent encounter for fracture with routine healing  THERAPY DIAG:  Pain in left hip  Stiffness of left hip, not elsewhere classified  Muscle weakness (generalized)  Difficulty in walking, not elsewhere classified  Rationale for Evaluation and Treatment: Rehabilitation  ONSET DATE: 03/16/23 ORIF  SUBJECTIVE:   SUBJECTIVE STATEMENT: Pt states she has her a/d only when she first initially stands in the house but then now able to walk around her home without a/d. Still using rollator for community distances for safety. Pain has continued to decrease and be well maintained.  Pt notes more back pain than hip pain. States the hardest activity now is just the stiffness with prolonged sitting and initially standing up.  PERTINENT HISTORY: Per H&P on 05/10/23: 6/19 fall that caused a closed nondisplaced fracture of the acetabulum and underwent ORIF 6/21. she was d/c on 6/25 to a SNF for PT/OT evaluation she was d/c with home PT which is now done but pt is doing home exercises.  PAIN:  Are you having  pain? Yes: NPRS scale: 0 currently, at worst 1/10 Pain location: Sometimes in groin and can radiates down side of L LE Pain description: Throbbing Aggravating factors: abducting/externally rotate, prolonged pressure Relieving factors: Resting  PRECAUTIONS: Fall  RED FLAGS: None   WEIGHT BEARING RESTRICTIONS: No  FALLS:  Has patient fallen in last 6 months? No  LIVING ENVIRONMENT: Lives with: lives with their spouse Lives in: House/apartment Stairs: Yes: External: 3 steps; has a ramp Has following equipment at home: Walker - 4 wheeled, w/c, bedside commode  OCCUPATION: Retired -- likes to be outside (can normally ride the lawnmower)  PLOF: Independent  PATIENT GOALS: Return to walking without a/d  NEXT MD VISIT: 07/19/23  OBJECTIVE:   DIAGNOSTIC FINDINGS:  6/21 pelvis x-ray IMPRESSION: Status post surgical internal fixation of left acetabular fracture. Continued presence of mildly displaced left inferior pubic ramus fracture.  PATIENT SURVEYS:  Lower Extremity Functional Score: 15 / 80 = 18.8 %  EDEMA:  "L foot stays swollen all the time since surgery"  MUSCLE LENGTH: Hamstrings: did not assess Thomas test: Right 10 deg; Left 0 deg  POSTURE: rounded shoulders and flexed trunk   PALPATION: TTP L Hip  LOWER EXTREMITY ROM:  Active ROM Right eval Left eval Left 06/12/23   Hip flexion 115 100 95 in supine  Hip extension 10 0   Hip abduction 20 15   Hip adduction     Hip internal rotation     Hip external rotation     Knee flexion     Knee extension     Ankle dorsiflexion     Ankle plantarflexion     Ankle inversion     Ankle eversion      (Blank rows = not tested)  LOWER EXTREMITY MMT:  MMT Right eval Left eval  Hip flexion 5 4  Hip extension 4 3+  Hip abduction 4 3+  Hip adduction    Hip internal rotation    Hip external rotation    Knee flexion 5 5  Knee extension 5 Unable to perform SLR  Ankle dorsiflexion    Ankle plantarflexion     Ankle inversion    Ankle eversion     (Blank rows = not tested)  LOWER EXTREMITY SPECIAL TESTS:  Did not assess  FUNCTIONAL TESTS:  5 times sit to stand: 17.4 sec (with UE support) Gait speed 0.58 m/sec   GAIT: Distance walked: 200'x2 Assistive device utilized:  Rollator Level of assistance: Modified independence Comments: L foot slightly externally rotated, twists hip into more ER coming into knee flexion with toe off   TODAY'S TREATMENT:  DATE:  06/26/23 Nustep L5 x 5 min UEs/LEs Standing  Hip flexor stretch on 4" step x30 sec R&L  Runner's step up on 4" step 2x10 R&L with bilat UE support  Step down 4" step 2x10 R&L with bilat UE support  Side step green TB around ankles x4 laps in // bars  Backwards stepping green TB around ankles x4 laps in // bars  Ambulating 2 laps around clinic no a/d with [facility]  06/19/23 Nustep seat  5 minutes level 5 UE/LE Standing with counter support  Heel/toe raise 20X  Mini squat 20X  Hip abduction 20X each  Hip extension 20X each  High march holds 20X each  4" forward step ups 10X Lt LE  4" lateral step ups 10X Lt LE  Stair negotiation 4" 2RT with bil HR's reciprocally  Tandem stance 2X30" each with intermittent HHA  Vectors 5X3" holds with 1 UE assist   06/15/23 Seated  Hamstring stretch x 30 sec L  Hip flexor stretch x 30 sec L  Piriformis stretch x 30 secL Supine  Figure 4 stretch x30 sec L Marching 2x10 L  Foot on pball, knee bent, hip IR 2x10 L   Foot on pball, knee flexion/ext x10 L working on keeping hip from ER  Bridging x10 bilat, staggered feet 2x10 with L foot back more Sidelying  Clamshell 2x10 R&L Squat touch chair 2x10 Gait training 1/2 lap around gym with SPC, focusing on coordinating a/d with steps, decreasing BOS and maintaining symmetrical steps. CGA req due to 2 minor LOBs (pt able to self  correct)    06/12/23 Nustep L6 x 5 min, UEs/LEs Supine  Heel slide x10, heel slide into march x10 L  Hip flexion AAROM to chest with towel 2x10 L  Hip abd/add AROM 2x10 L  SLR short range 2x5 L Sidelying  Clamshell 2x10 L  Reverse clamshell 2x10 L Prone  Hip ext with knee flexed 2x10 R&L  Knee flexion 2x10 Standing with counter support  Heel/toe raise 2x10  Mini squat 2x10  Heel tap forward/backward 2x10  Marching 2x10  06/08/23 Nustep L5 x 5 min, UEs/LEs Supine Heel slide 2x10 Hip ext iso into bed with knee extended 10x3" Hip flexor stretch 2x30" Hip abd/add AROM 2x10 SAQ 2x10 SLR short range with bolster under knee 2x10 Hip flexion AAROM with towel support 2x10 Clamshell green TB 2x10  Bridge green TB around knees 2x10 Standing  Hip pendulums on step fwd/bwd, side to side, circles clockwise & counter clockwise x30" each  One foot on 6" step rock forward/backward working on glute iso and weight bearing/weight shifting 2x10  Mini squat 2x10  06/01/23  See HEP  PATIENT EDUCATION:  Education details: Exam findings, POC, initial HEP Person educated: Patient Education method: Explanation, Demonstration, and Handouts Education comprehension: verbalized understanding, returned demonstration, and needs further education  HOME EXERCISE PROGRAM: Access Code: [email or web address] URL: [email or web address] Date: 06/12/2023 Prepared by: Vernon Prey April Kirstie Peri  Exercises - Supine Heel Slide  - 1 x daily - 7 x weekly - 3 sets - 10 reps - Supine Short Arc Quad  - 1 x daily - 7 x weekly - 2-3 sets - 10 reps - Supine Bridge with Resistance Band  - 1 x daily - 7 x weekly - 2-3 sets - 10 reps - Hooklying Single Knee to Chest Stretch with Towel  - 1 x daily - 7 x weekly - 2 sets - 10 reps - Supine Hip Abduction AROM  - 1 x  daily - 7 x weekly - 2 sets - 10 reps - Clamshell  - 1 x daily - 7 x weekly - 2 sets - 10 reps - Small Range Straight Leg Raise  - 1 x daily - 7 x weekly  - 2 sets - 5 reps  ASSESSMENT:  CLINICAL IMPRESSION: Treatment focused on progressive strengthening and stepping. Pt continues to progress very well. Working on weaning off any a/d.   From eval: Patient is a 72 y.o. F who was seen today for physical therapy evaluation and treatment s/p L ORIF for acetabular fracture on 03/16/23. Assessment significant for L LE weakness, decreased ROM, pain, decreased tolerance to prolonged weight bearing, and decreased balance affecting standing, amb, and squatting limiting home and community tasks. Pt will benefit from PT to address these deficits to return to PLOF.   OBJECTIVE IMPAIRMENTS: Abnormal gait, decreased activity tolerance, decreased balance, decreased endurance, decreased mobility, difficulty walking, decreased ROM, decreased strength, hypomobility, increased fascial restrictions, impaired flexibility, improper body mechanics, postural dysfunction, and pain.    GOALS: Goals reviewed with patient? Yes  SHORT TERM GOALS: Target date: 06/29/2023  Pt will be ind with initial HEP Baseline: Goal status: INITIAL  2.  Pt will demo L = R hip abduction to improve ease with car transfers and bed mobility Baseline:  Goal status: INITIAL  3.  Pt will improve L hip flexion to at least 110 deg to improve car transfers and bed mobility Baseline:  Goal status: MET  4. Pt will be able to perform SLR to demo increased L LE strength Baseline:  Goal status: MET   LONG TERM GOALS: Target date: 07/27/2023   Pt will be ind with progression and advancement of HEP Baseline:  Goal status: INITIAL  2.  Pt will have improved 5x STS to </=13 sec to demo decreased fall risk and improved functional LE strength Baseline:  Goal status: INITIAL  3.  Pt will be able to amb at least 150' without RW for home amb Baseline:  Goal status: INITIAL  4.  Pt will report LEFS of >/=28.8% to demo [facility] Baseline:  Goal status: INITIAL    PLAN:  PT FREQUENCY:  2x/week  PT DURATION: 8 Makin  PLANNED INTERVENTIONS: Therapeutic exercises, Therapeutic activity, Neuromuscular re-education, Balance training, Gait training, Patient/Family education, Self Care, Joint mobilization, Stair training, Aquatic Therapy, Dry Needling, Electrical stimulation, Spinal mobilization, Cryotherapy, Moist heat, Taping, Ionotophoresis 4mg /ml Dexamethasone, Manual therapy, and Re-evaluation  PLAN FOR NEXT SESSION: Assess response to HEP. Work on hip ROM, strengthening, weight bearing/balance.   Ashlynn Gunnels April Ma L Lacreasha Hinds, PT 06/26/2023, 10:15 AM

## 2023-06-29 ENCOUNTER — Ambulatory Visit (HOSPITAL_COMMUNITY): Payer: Medicare HMO | Admitting: Physical Therapy

## 2023-06-29 ENCOUNTER — Encounter (HOSPITAL_COMMUNITY): Payer: Self-pay | Admitting: Physical Therapy

## 2023-06-29 DIAGNOSIS — M25552 Pain in left hip: Secondary | ICD-10-CM | POA: Diagnosis not present

## 2023-06-29 DIAGNOSIS — R262 Difficulty in walking, not elsewhere classified: Secondary | ICD-10-CM | POA: Diagnosis not present

## 2023-06-29 DIAGNOSIS — M6281 Muscle weakness (generalized): Secondary | ICD-10-CM

## 2023-06-29 DIAGNOSIS — M25652 Stiffness of left hip, not elsewhere classified: Secondary | ICD-10-CM | POA: Diagnosis not present

## 2023-06-29 NOTE — Therapy (Signed)
OUTPATIENT PHYSICAL THERAPY TREATMENT   Patient Name: Kristina [name] MRN: [id number] DOB:1951/02/17, 72 y.o., female Today's Date: 06/29/2023  END OF SESSION:  PT End of Session - 06/29/23 1155     Visit Number 7    Number of Visits 16    Date for PT Re-Evaluation 07/27/23    Authorization Type [employer]    Progress Note Due on Visit 10    PT Start Time 1150    PT Stop Time 1230    PT Time Calculation (min) 40 min    Activity Tolerance Patient tolerated treatment well    Behavior During Therapy WFL for tasks assessed/performed                Past Medical History:  Diagnosis Date   Acute respiratory distress syndrome (ARDS) due to COVID-19 virus (HCC) 09/2019   Chronic combined systolic and diastolic CHF (congestive heart failure) (HCC)    Diabetes mellitus, type 2 (HCC)    GERD (gastroesophageal reflux disease)    Hyperlipidemia    Hypertension    Past Surgical History:  Procedure Laterality Date   COLONOSCOPY N/A 12/21/2016   Procedure: COLONOSCOPY;  Surgeon: Malissa Hippo, MD;  Location: [facility];  Service: Endoscopy;  Laterality: N/A;  930   NO PAST SURGERIES     ORIF ACETABULAR FRACTURE Left 03/16/2023   Procedure: OPEN REDUCTION INTERNAL FIXATION (ORIF) ACETABULAR FRACTURE STOPPA APPROACH;  Surgeon: Roby Lofts, MD;  Location: [facility];  Service: Orthopedics;  Laterality: Left;   Patient Active Problem List   Diagnosis Date Noted   Chronic constipation 05/10/2023   Hemorrhoids 05/10/2023   Closed fracture of right acetabulum (HCC) 05/02/2023   Acetabular fracture (HCC) 03/14/2023   Candidiasis of skin 02/12/2023   OP (osteoporosis) 01/22/2023   Proteinuria 06/15/2022   Sleep apnea 09/08/2021   Congestive heart failure (HCC) 07/29/2021   Hyperglycemia due to type 2 diabetes mellitus (HCC) 05/04/2021   Chronic systolic heart failure (HCC) 04/09/2021   Hypoxia    Acute hypoxemic respiratory failure (HCC) 04/05/2021   Dyspnea 04/05/2021    Tremor 04/05/2021   Impaired fasting glucose 01/25/2021   Gastroesophageal reflux disease without esophagitis 01/25/2021   Mixed hyperlipidemia 01/25/2021   Morbid obesity (HCC) 01/25/2021   Prediabetes 01/25/2021   Tachycardia 01/25/2021   Pain in left knee 05/20/2020   Essential hypertension 10/08/2019   Uncontrolled diabetes mellitus 10/08/2019   UTI (urinary tract infection) 10/08/2019   Thrush 10/08/2019   Pneumonia due to COVID-19 virus 10/08/2019   Chronic respiratory failure with hypoxia (HCC) 10/02/2019   Acute respiratory distress syndrome (ARDS) due to COVID-19 virus (HCC) 10/02/2019   Pain in joint of right shoulder 07/15/2018   Pain in right foot 07/15/2018   Special screening for malignant neoplasms, colon 11/06/2016    PCP: Benita Stabile, MD  REFERRING PROVIDER: Dayna Ramus, FNP  REFERRING DIAG: [phone] (ICD-10-CM) - Unspecified fracture of left acetabulum, subsequent encounter for fracture with routine healing  THERAPY DIAG:  Pain in left hip  Stiffness of left hip, not elsewhere classified  Muscle weakness (generalized)  Difficulty in walking, not elsewhere classified  Rationale for Evaluation and Treatment: Rehabilitation  ONSET DATE: 03/16/23 ORIF  SUBJECTIVE:   SUBJECTIVE STATEMENT: Pt states she has her a/d only when she first initially stands in the house but then now able to walk around her home without a/d. Still using rollator for community distances for safety. Pain has continued to decrease and be well maintained.  Pt notes more back pain than hip pain. States the hardest activity now is just the stiffness with prolonged sitting and initially standing up.  PERTINENT HISTORY: Per H&P on 05/10/23: 6/19 fall that caused a closed nondisplaced fracture of the acetabulum and underwent ORIF 6/21. she was d/c on 6/25 to a SNF for PT/OT evaluation she was d/c with home PT which is now done but pt is doing home exercises.  PAIN:  Are you having  pain? Yes: NPRS scale: 0 currently, at worst 1/10 Pain location: Sometimes in groin and can radiates down side of L LE Pain description: Throbbing Aggravating factors: abducting/externally rotate, prolonged pressure Relieving factors: Resting  PRECAUTIONS: Fall  RED FLAGS: None   WEIGHT BEARING RESTRICTIONS: No  FALLS:  Has patient fallen in last 6 months? No  LIVING ENVIRONMENT: Lives with: lives with their spouse Lives in: House/apartment Stairs: Yes: External: 3 steps; has a ramp Has following equipment at home: Walker - 4 wheeled, w/c, bedside commode  OCCUPATION: Retired -- likes to be outside (can normally ride the lawnmower)  PLOF: Independent  PATIENT GOALS: Return to walking without a/d  NEXT MD VISIT: 07/19/23  OBJECTIVE:   DIAGNOSTIC FINDINGS:  6/21 pelvis x-ray IMPRESSION: Status post surgical internal fixation of left acetabular fracture. Continued presence of mildly displaced left inferior pubic ramus fracture.  PATIENT SURVEYS:  Lower Extremity Functional Score: 15 / 80 = 18.8 %  EDEMA:  "L foot stays swollen all the time since surgery"  MUSCLE LENGTH: Hamstrings: did not assess Thomas test: Right 10 deg; Left 0 deg  POSTURE: rounded shoulders and flexed trunk   PALPATION: TTP L Hip  LOWER EXTREMITY ROM:  Active ROM Right eval Left eval Left 06/12/23   Hip flexion 115 100 95 in supine  Hip extension 10 0   Hip abduction 20 15   Hip adduction     Hip internal rotation     Hip external rotation     Knee flexion     Knee extension     Ankle dorsiflexion     Ankle plantarflexion     Ankle inversion     Ankle eversion      (Blank rows = not tested)  LOWER EXTREMITY MMT:  MMT Right eval Left eval  Hip flexion 5 4  Hip extension 4 3+  Hip abduction 4 3+  Hip adduction    Hip internal rotation    Hip external rotation    Knee flexion 5 5  Knee extension 5 Unable to perform SLR  Ankle dorsiflexion    Ankle plantarflexion     Ankle inversion    Ankle eversion     (Blank rows = not tested)  LOWER EXTREMITY SPECIAL TESTS:  Did not assess  FUNCTIONAL TESTS:  5 times sit to stand: 17.4 sec (with UE support) Gait speed 0.58 m/sec   GAIT: Distance walked: 200'x2 Assistive device utilized:  Rollator Level of assistance: Modified independence Comments: L foot slightly externally rotated, twists hip into more ER coming into knee flexion with toe off   TODAY'S TREATMENT:  DATE:  06/29/23 Amb x 1 lap around gym with [facility] for warm up with focus on keeping hips from rotating Standing  Lumbar ext against counter x10  Gastroc stretch on slant board x30"  Soleus stretch on slant board x30"  Hip flexor stretch on 6" step x30"  Runner's step up 6" step 2x10 with L UE support  Side step up and over 6" step 2x10 Leg press double leg 3 plates [id number] Amb outside on sidewalk, CGA while going down small slope  06/26/23 Nustep L5 x 5 min UEs/LEs Standing  Hip flexor stretch on 4" step x30 sec R&L  Runner's step up on 4" step 2x10 R&L with bilat UE support  Step down 4" step 2x10 R&L with bilat UE support  Side step green TB around ankles x4 laps in // bars  Backwards stepping green TB around ankles x4 laps in // bars  Ambulating 2 laps around clinic no a/d with [facility]  06/19/23 Nustep seat  5 minutes level 5 UE/LE Standing with counter support  Heel/toe raise 20X  Mini squat 20X  Hip abduction 20X each  Hip extension 20X each  High march holds 20X each  4" forward step ups 10X Lt LE  4" lateral step ups 10X Lt LE  Stair negotiation 4" 2RT with bil HR's reciprocally  Tandem stance 2X30" each with intermittent HHA  Vectors 5X3" holds with 1 UE assist   06/15/23 Seated  Hamstring stretch x 30 sec L  Hip flexor stretch x 30 sec L  Piriformis stretch x 30 secL Supine  Figure 4 stretch x30  sec L Marching 2x10 L  Foot on pball, knee bent, hip IR 2x10 L   Foot on pball, knee flexion/ext x10 L working on keeping hip from ER  Bridging x10 bilat, staggered feet 2x10 with L foot back more Sidelying  Clamshell 2x10 R&L Squat touch chair 2x10 Gait training 1/2 lap around gym with SPC, focusing on coordinating a/d with steps, decreasing BOS and maintaining symmetrical steps. CGA req due to 2 minor LOBs (pt able to self correct)    06/12/23 Nustep L6 x 5 min, UEs/LEs Supine  Heel slide x10, heel slide into march x10 L  Hip flexion AAROM to chest with towel 2x10 L  Hip abd/add AROM 2x10 L  SLR short range 2x5 L Sidelying  Clamshell 2x10 L  Reverse clamshell 2x10 L Prone  Hip ext with knee flexed 2x10 R&L  Knee flexion 2x10 Standing with counter support  Heel/toe raise 2x10  Mini squat 2x10  Heel tap forward/backward 2x10  Marching 2x10  06/08/23 Nustep L5 x 5 min, UEs/LEs Supine Heel slide 2x10 Hip ext iso into bed with knee extended 10x3" Hip flexor stretch 2x30" Hip abd/add AROM 2x10 SAQ 2x10 SLR short range with bolster under knee 2x10 Hip flexion AAROM with towel support 2x10 Clamshell green TB 2x10  Bridge green TB around knees 2x10 Standing  Hip pendulums on step fwd/bwd, side to side, circles clockwise & counter clockwise x30" each  One foot on 6" step rock forward/backward working on glute iso and weight bearing/weight shifting 2x10  Mini squat 2x10  06/01/23  See HEP  PATIENT EDUCATION:  Education details: Exam findings, POC, initial HEP Person educated: Patient Education method: Explanation, Demonstration, and Handouts Education comprehension: verbalized understanding, returned demonstration, and needs further education  HOME EXERCISE PROGRAM: Access Code: [email or web address] URL: [email or web address] Date: 06/29/2023 Prepared by: Vernon Prey April Kirstie Peri  Exercises - Supine Figure 4 (Mirrored)  -  1 x daily - 7 x weekly - 2 sets - 30 sec  hold - Seated Hip Internal Rotation AROM  - 1 x daily - 7 x weekly - 2 sets - 10 reps - Squat with Chair Touch  - 1 x daily - 7 x weekly - 2 sets - 10 reps - Runner's Step Up/Down  - 1 x daily - 7 x weekly - 2 sets - 10 reps - Side Stepping with Resistance at Ankles  - 1 x daily - 7 x weekly - 2 sets - 10 reps - Standing Lumbar Extension with Counter  - 1 x daily - 7 x weekly - 1 sets - 10 reps - Hip Flexor Stretch on Step  - 1 x daily - 7 x weekly - 2 sets - 30 sec hold  ASSESSMENT:  CLINICAL IMPRESSION: Pt comes in using her SPC. Continued strengthening to increase weight bearing and single leg stability. Pt is reporting some increase in back pain. Added stretches to try and address accordingly. Pt is cautious of her balance when going on unlevel surfaces/slopes. Discussed doing 10 min of walking daily to start improving her endurance and activity level.   From eval: Patient is a 72 y.o. F who was seen today for physical therapy evaluation and treatment s/p L ORIF for acetabular fracture on 03/16/23. Assessment significant for L LE weakness, decreased ROM, pain, decreased tolerance to prolonged weight bearing, and decreased balance affecting standing, amb, and squatting limiting home and community tasks. Pt will benefit from PT to address these deficits to return to PLOF.   OBJECTIVE IMPAIRMENTS: Abnormal gait, decreased activity tolerance, decreased balance, decreased endurance, decreased mobility, difficulty walking, decreased ROM, decreased strength, hypomobility, increased fascial restrictions, impaired flexibility, improper body mechanics, postural dysfunction, and pain.    GOALS: Goals reviewed with patient? Yes  SHORT TERM GOALS: Target date: 06/29/2023  Pt will be ind with initial HEP Baseline: Goal status: INITIAL  2.  Pt will demo L = R hip abduction to improve ease with car transfers and bed mobility Baseline:  Goal status: INITIAL  3.  Pt will improve L hip flexion to at  least 110 deg to improve car transfers and bed mobility Baseline:  Goal status: MET  4. Pt will be able to perform SLR to demo increased L LE strength Baseline:  Goal status: MET   LONG TERM GOALS: Target date: 07/27/2023   Pt will be ind with progression and advancement of HEP Baseline:  Goal status: INITIAL  2.  Pt will have improved 5x STS to </=13 sec to demo decreased fall risk and improved functional LE strength Baseline:  Goal status: INITIAL  3.  Pt will be able to amb at least 150' without RW for home amb Baseline:  Goal status: INITIAL  4.  Pt will report LEFS of >/=28.8% to demo MCID Baseline:  Goal status: INITIAL    PLAN:  PT FREQUENCY: 2x/week  PT DURATION: 8 [place]  PLANNED INTERVENTIONS: Therapeutic exercises, Therapeutic activity, Neuromuscular re-education, Balance training, Gait training, Patient/Family education, Self Care, Joint mobilization, Stair training, Aquatic Therapy, Dry Needling, Electrical stimulation, Spinal mobilization, Cryotherapy, Moist heat, Taping, Ionotophoresis 4mg /ml Dexamethasone, Manual therapy, and Re-evaluation  PLAN FOR NEXT SESSION: Assess response to HEP. Work on hip ROM, strengthening, weight bearing/balance.   Emmaleigh Longo April Ma L Vicenta Olds, PT 06/29/2023, 11:55 AM

## 2023-07-03 ENCOUNTER — Encounter (HOSPITAL_COMMUNITY): Payer: Medicare HMO | Admitting: Physical Therapy

## 2023-07-03 DIAGNOSIS — S32402D Unspecified fracture of left acetabulum, subsequent encounter for fracture with routine healing: Secondary | ICD-10-CM | POA: Diagnosis not present

## 2023-07-04 ENCOUNTER — Ambulatory Visit (HOSPITAL_COMMUNITY): Payer: Medicare HMO

## 2023-07-04 ENCOUNTER — Encounter (HOSPITAL_COMMUNITY): Payer: Self-pay

## 2023-07-04 DIAGNOSIS — M25652 Stiffness of left hip, not elsewhere classified: Secondary | ICD-10-CM | POA: Diagnosis not present

## 2023-07-04 DIAGNOSIS — M6281 Muscle weakness (generalized): Secondary | ICD-10-CM

## 2023-07-04 DIAGNOSIS — R262 Difficulty in walking, not elsewhere classified: Secondary | ICD-10-CM | POA: Diagnosis not present

## 2023-07-04 DIAGNOSIS — M25552 Pain in left hip: Secondary | ICD-10-CM

## 2023-07-04 NOTE — Therapy (Signed)
OUTPATIENT PHYSICAL THERAPY TREATMENT   Patient Name: Kristina [name] MRN: [id number] DOB:03/12/1951, 72 y.o., female Today's Date: 07/04/2023  END OF SESSION:  PT End of Session - 07/04/23 0942     Visit Number 8    Number of Visits 16    Date for PT Re-Evaluation 07/27/23    Authorization Type [employer]    Progress Note Due on Visit 10    PT Start Time [phone]    PT Stop Time 1028    PT Time Calculation (min) 46 min    Activity Tolerance Patient tolerated treatment well    Behavior During Therapy [facility] for tasks assessed/performed                Past Medical History:  Diagnosis Date   Acute respiratory distress syndrome (ARDS) due to COVID-19 virus (HCC) 09/2019   Chronic combined systolic and diastolic CHF (congestive heart failure) (HCC)    Diabetes mellitus, type 2 (HCC)    GERD (gastroesophageal reflux disease)    Hyperlipidemia    Hypertension    Past Surgical History:  Procedure Laterality Date   COLONOSCOPY N/A 12/21/2016   Procedure: COLONOSCOPY;  Surgeon: Malissa Hippo, MD;  Location: [facility];  Service: Endoscopy;  Laterality: N/A;  930   NO PAST SURGERIES     ORIF ACETABULAR FRACTURE Left 03/16/2023   Procedure: OPEN REDUCTION INTERNAL FIXATION (ORIF) ACETABULAR FRACTURE STOPPA APPROACH;  Surgeon: Roby Lofts, MD;  Location: [facility] OR;  Service: Orthopedics;  Laterality: Left;   Patient Active Problem List   Diagnosis Date Noted   Chronic constipation 05/10/2023   Hemorrhoids 05/10/2023   Closed fracture of right acetabulum (HCC) 05/02/2023   Acetabular fracture (HCC) 03/14/2023   Candidiasis of skin 02/12/2023   OP (osteoporosis) 01/22/2023   Proteinuria 06/15/2022   Sleep apnea 09/08/2021   Congestive heart failure (HCC) 07/29/2021   Hyperglycemia due to type 2 diabetes mellitus (HCC) 05/04/2021   Chronic systolic heart failure (HCC) 04/09/2021   Hypoxia    Acute hypoxemic respiratory failure (HCC) 04/05/2021   Dyspnea 04/05/2021    Tremor 04/05/2021   Impaired fasting glucose 01/25/2021   Gastroesophageal reflux disease without esophagitis 01/25/2021   Mixed hyperlipidemia 01/25/2021   Morbid obesity (HCC) 01/25/2021   Prediabetes 01/25/2021   Tachycardia 01/25/2021   Pain in left knee 05/20/2020   Essential hypertension 10/08/2019   Uncontrolled diabetes mellitus 10/08/2019   UTI (urinary tract infection) 10/08/2019   Thrush 10/08/2019   Pneumonia due to COVID-19 virus 10/08/2019   Chronic respiratory failure with hypoxia (HCC) 10/02/2019   Acute respiratory distress syndrome (ARDS) due to COVID-19 virus (HCC) 10/02/2019   Pain in joint of right shoulder 07/15/2018   Pain in right foot 07/15/2018   Special screening for malignant neoplasms, colon 11/06/2016    PCP: Benita Stabile, MD  REFERRING PROVIDER: Dayna Ramus, FNP  REFERRING DIAG: S32.401D (ICD-10-CM) - Unspecified fracture of left acetabulum, subsequent encounter for fracture with routine healing  THERAPY DIAG:  Pain in left hip  Stiffness of left hip, not elsewhere classified  Muscle weakness (generalized)  Difficulty in walking, not elsewhere classified  Rationale for Evaluation and Treatment: Rehabilitation  ONSET DATE: 03/16/23 ORIF  SUBJECTIVE:   SUBJECTIVE STATEMENT: Pt arrived with SPC.  Reports she is feeling good today with no reports of increased pain.  Reports she has walked some around the house.  Uses rollator to go down ramp at home.  Has began walking program daily.  Used  cane for first time for church.  Went to MD yesterday and happy with progress.  PERTINENT HISTORY: Per H&P on 05/10/23: 6/19 fall that caused a closed nondisplaced fracture of the acetabulum and underwent ORIF 6/21. she was d/c on 6/25 to a SNF for PT/OT evaluation she was d/c with home PT which is now done but pt is doing home exercises.  PAIN:  Are you having pain? Yes: NPRS scale: 0 currently, at worst 1/10 Pain location: Sometimes in groin and can  radiates down side of L LE Pain description: Throbbing Aggravating factors: abducting/externally rotate, prolonged pressure Relieving factors: Resting  PRECAUTIONS: Fall  RED FLAGS: None   WEIGHT BEARING RESTRICTIONS: No  FALLS:  Has patient fallen in last 6 months? No  LIVING ENVIRONMENT: Lives with: lives with their spouse Lives in: House/apartment Stairs: Yes: External: 3 steps; has a ramp Has following equipment at home: Walker - 4 wheeled, w/c, bedside commode  OCCUPATION: Retired -- likes to be outside (can normally ride the lawnmower)  PLOF: Independent  PATIENT GOALS: Return to walking without a/d  NEXT MD VISIT: 07/19/23  OBJECTIVE:   DIAGNOSTIC FINDINGS:  6/21 pelvis x-ray IMPRESSION: Status post surgical internal fixation of left acetabular fracture. Continued presence of mildly displaced left inferior pubic ramus fracture.  PATIENT SURVEYS:  Lower Extremity Functional Score: 15 / 80 = 18.8 %  EDEMA:  "L foot stays swollen all the time since surgery"  MUSCLE LENGTH: Hamstrings: did not assess Thomas test: Right 10 deg; Left 0 deg  POSTURE: rounded shoulders and flexed trunk   PALPATION: TTP L Hip  LOWER EXTREMITY ROM:  Active ROM Right eval Left eval Left 06/12/23   Hip flexion 115 100 95 in supine  Hip extension 10 0   Hip abduction 20 15   Hip adduction     Hip internal rotation     Hip external rotation     Knee flexion     Knee extension     Ankle dorsiflexion     Ankle plantarflexion     Ankle inversion     Ankle eversion      (Blank rows = not tested)  LOWER EXTREMITY MMT:  MMT Right eval Left eval  Hip flexion 5 4  Hip extension 4 3+  Hip abduction 4 3+  Hip adduction    Hip internal rotation    Hip external rotation    Knee flexion 5 5  Knee extension 5 Unable to perform SLR  Ankle dorsiflexion    Ankle plantarflexion    Ankle inversion    Ankle eversion     (Blank rows = not tested)  LOWER EXTREMITY SPECIAL  TESTS:  Did not assess  FUNCTIONAL TESTS:  5 times sit to stand: 17.4 sec (with UE support) Gait speed 0.58 m/sec   GAIT: Distance walked: 200'x2 Assistive device utilized:  Rollator Level of assistance: Modified independence Comments: L foot slightly externally rotated, twists hip into more ER coming into knee flexion with toe off   TODAY'S TREATMENT:  DATE:  07/04/23:  Ambulate 268ft 2 point sequence with SPC for warm up- cueing for core stability to reduce hips from rotating Standing:  Abduction with RTB around thigh 10x 3"  Marching with RTB around thighs 10x 3"  Donkey kick with RTB around thighs 10x 3"  Lumbar ext against counter x10  Side step green TB around ankles x4 laps in // bar  SLS Rt 5", Lt 5" max of 3  Vector stance 3x 5"  Tandem stance 1x 30"; partial tandem stance on foam intermittent HHA 2x 30"  Squat front of chair  Stair training 4in reciprocal pattern ascend, step to descending.  Then 7in step height. 1RT each  06/29/23 Amb x 1 lap around gym with [facility] for warm up with focus on keeping hips from rotating Standing  Lumbar ext against counter x10  Gastroc stretch on slant board x30"  Soleus stretch on slant board x30"  Hip flexor stretch on 6" step x30"  Runner's step up 6" step 2x10 with L UE support  Side step up and over 6" step 2x10 Leg press double leg 3 plates [id number] Amb outside on sidewalk, CGA while going down small slope  06/26/23 Nustep L5 x 5 min UEs/LEs Standing  Hip flexor stretch on 4" step x30 sec R&L  Runner's step up on 4" step 2x10 R&L with bilat UE support  Step down 4" step 2x10 R&L with bilat UE support  Side step green TB around ankles x4 laps in // bars  Backwards stepping green TB around ankles x4 laps in // bars  Ambulating 2 laps around clinic no a/d with [facility]  06/19/23 Nustep seat  5 minutes level 5  UE/LE Standing with counter support  Heel/toe raise 20X  Mini squat 20X  Hip abduction 20X each  Hip extension 20X each  High march holds 20X each  4" forward step ups 10X Lt LE  4" lateral step ups 10X Lt LE  Stair negotiation 4" 2RT with bil HR's reciprocally  Tandem stance 2X30" each with intermittent HHA  SLS 3x each leg  Vectors 5X3" holds with 1 UE assist   06/15/23 Seated  Hamstring stretch x 30 sec L  Hip flexor stretch x 30 sec L  Piriformis stretch x 30 secL Supine  Figure 4 stretch x30 sec L Marching 2x10 L  Foot on pball, knee bent, hip IR 2x10 L   Foot on pball, knee flexion/ext x10 L working on keeping hip from ER  Bridging x10 bilat, staggered feet 2x10 with L foot back more Sidelying  Clamshell 2x10 R&L Squat touch chair 2x10 Gait training 1/2 lap around gym with SPC, focusing on coordinating a/d with steps, decreasing BOS and maintaining symmetrical steps. CGA req due to 2 minor LOBs (pt able to self correct)    06/12/23 Nustep L6 x 5 min, UEs/LEs Supine  Heel slide x10, heel slide into march x10 L  Hip flexion AAROM to chest with towel 2x10 L  Hip abd/add AROM 2x10 L  SLR short range 2x5 L Sidelying  Clamshell 2x10 L  Reverse clamshell 2x10 L Prone  Hip ext with knee flexed 2x10 R&L  Knee flexion 2x10 Standing with counter support  Heel/toe raise 2x10  Mini squat 2x10  Heel tap forward/backward 2x10  Marching 2x10  06/08/23 Nustep L5 x 5 min, UEs/LEs Supine Heel slide 2x10 Hip ext iso into bed with knee extended 10x3" Hip flexor stretch 2x30" Hip abd/add AROM 2x10 SAQ 2x10 SLR short range with  bolster under knee 2x10 Hip flexion AAROM with towel support 2x10 Clamshell green TB 2x10  Bridge green TB around knees 2x10 Standing  Hip pendulums on step fwd/bwd, side to side, circles clockwise & counter clockwise x30" each  One foot on 6" step rock forward/backward working on glute iso and weight bearing/weight shifting 2x10  Mini squat  2x10  06/01/23  See HEP  PATIENT EDUCATION:  Education details: Exam findings, POC, initial HEP Person educated: Patient Education method: Explanation, Demonstration, and Handouts Education comprehension: verbalized understanding, returned demonstration, and needs further education  HOME EXERCISE PROGRAM: Access Code: [email or web address] URL: [email or web address] Date: 06/29/2023 Prepared by: Vernon Prey April Kirstie Peri  Exercises - Supine Figure 4 (Mirrored)  - 1 x daily - 7 x weekly - 2 sets - 30 sec hold - Seated Hip Internal Rotation AROM  - 1 x daily - 7 x weekly - 2 sets - 10 reps - Squat with Chair Touch  - 1 x daily - 7 x weekly - 2 sets - 10 reps - Runner's Step Up/Down  - 1 x daily - 7 x weekly - 2 sets - 10 reps - Side Stepping with Resistance at Ankles  - 1 x daily - 7 x weekly - 2 sets - 10 reps - Standing Lumbar Extension with Counter  - 1 x daily - 7 x weekly - 1 sets - 10 reps - Hip Flexor Stretch on Step  - 1 x daily - 7 x weekly - 2 sets - 30 sec hold  07/04/23: - Standing Tandem Balance with Counter Support  - 1-2 x daily - 7 x weekly - 1 sets - 3 reps - 30" hold - Single Leg Stance  - 1-2 x daily - 7 x weekly - 1 sets - 3 reps - 30" hold    ASSESSMENT:  CLINICAL IMPRESSION: Session focus on functional strengthening and additional balance training.  Presents with increased difficulty with SLS based activities with cueing required for core activities to reduce trunk flexion.  Added tandem stance and SLS to HEP, encouraged to complete all balance activities by counter or sink for safety with verbalized understanding.  No reports of pain through session.   From eval: Patient is a 72 y.o. F who was seen today for physical therapy evaluation and treatment s/p L ORIF for acetabular fracture on 03/16/23. Assessment significant for L LE weakness, decreased ROM, pain, decreased tolerance to prolonged weight bearing, and decreased balance affecting standing, amb, and  squatting limiting home and community tasks. Pt will benefit from PT to address these deficits to return to PLOF.   OBJECTIVE IMPAIRMENTS: Abnormal gait, decreased activity tolerance, decreased balance, decreased endurance, decreased mobility, difficulty walking, decreased ROM, decreased strength, hypomobility, increased fascial restrictions, impaired flexibility, improper body mechanics, postural dysfunction, and pain.    GOALS: Goals reviewed with patient? Yes  SHORT TERM GOALS: Target date: 06/29/2023  Pt will be ind with initial HEP Baseline: Goal status: INITIAL  2.  Pt will demo L = R hip abduction to improve ease with car transfers and bed mobility Baseline:  Goal status: INITIAL  3.  Pt will improve L hip flexion to at least 110 deg to improve car transfers and bed mobility Baseline:  Goal status: MET  4. Pt will be able to perform SLR to demo increased L LE strength Baseline:  Goal status: MET   LONG TERM GOALS: Target date: 07/27/2023   Pt will be ind with progression and advancement of HEP Baseline:  Goal status: INITIAL  2.  Pt will have improved 5x STS to </=13 sec to demo decreased fall risk and improved functional LE strength Baseline:  Goal status: INITIAL  3.  Pt will be able to amb at least 150' without RW for home amb Baseline:  Goal status: INITIAL  4.  Pt will report LEFS of >/=28.8% to demo MCID Baseline:  Goal status: INITIAL    PLAN:  PT FREQUENCY: 2x/week  PT DURATION: 8 [place]  PLANNED INTERVENTIONS: Therapeutic exercises, Therapeutic activity, Neuromuscular re-education, Balance training, Gait training, Patient/Family education, Self Care, Joint mobilization, Stair training, Aquatic Therapy, Dry Needling, Electrical stimulation, Spinal mobilization, Cryotherapy, Moist heat, Taping, Ionotophoresis 4mg /ml Dexamethasone, Manual therapy, and Re-evaluation  PLAN FOR NEXT SESSION: Assess response to HEP. Work on hip ROM, strengthening, weight  bearing/balance.  Becky Sax, LPTA/CLT; CBIS [phone]  Juel Burrow, PTA 07/04/2023, 1:14 PM

## 2023-07-06 ENCOUNTER — Ambulatory Visit (HOSPITAL_COMMUNITY): Payer: Medicare HMO

## 2023-07-06 DIAGNOSIS — M6281 Muscle weakness (generalized): Secondary | ICD-10-CM

## 2023-07-06 DIAGNOSIS — R262 Difficulty in walking, not elsewhere classified: Secondary | ICD-10-CM

## 2023-07-06 DIAGNOSIS — M25652 Stiffness of left hip, not elsewhere classified: Secondary | ICD-10-CM

## 2023-07-06 DIAGNOSIS — M25552 Pain in left hip: Secondary | ICD-10-CM | POA: Diagnosis not present

## 2023-07-06 NOTE — Therapy (Signed)
OUTPATIENT PHYSICAL THERAPY TREATMENT   Patient Name: Kristina [name] MRN: [id number] DOB:1951/07/23, 72 y.o., female Today's Date: 07/06/2023  END OF SESSION:  PT End of Session - 07/06/23 1034     Visit Number 9    Number of Visits 16    Date for PT Re-Evaluation 07/27/23    Authorization Type [employer] Medicare    Progress Note Due on Visit 10    PT Start Time 1027    PT Stop Time 1057    PT Time Calculation (min) 30 min    Activity Tolerance Patient tolerated treatment well;No increased pain    Behavior During Therapy WFL for tasks assessed/performed                Past Medical History:  Diagnosis Date   Acute respiratory distress syndrome (ARDS) due to COVID-19 virus (HCC) 09/2019   Chronic combined systolic and diastolic CHF (congestive heart failure) (HCC)    Diabetes mellitus, type 2 (HCC)    GERD (gastroesophageal reflux disease)    Hyperlipidemia    Hypertension    Past Surgical History:  Procedure Laterality Date   COLONOSCOPY N/A 12/21/2016   Procedure: COLONOSCOPY;  Surgeon: Malissa Hippo, MD;  [facility]: [facility];  Service: Endoscopy;  Laterality: N/A;  930   NO PAST SURGERIES     ORIF ACETABULAR FRACTURE Left 03/16/2023   Procedure: OPEN REDUCTION INTERNAL FIXATION (ORIF) ACETABULAR FRACTURE STOPPA APPROACH;  Surgeon: Roby Lofts, MD;  Location: [facility] OR;  Service: Orthopedics;  Laterality: Left;   Patient Active Problem List   Diagnosis Date Noted   Chronic constipation 05/10/2023   Hemorrhoids 05/10/2023   Closed fracture of right acetabulum (HCC) 05/02/2023   Acetabular fracture (HCC) 03/14/2023   Candidiasis of skin 02/12/2023   OP (osteoporosis) 01/22/2023   Proteinuria 06/15/2022   Sleep apnea 09/08/2021   Congestive heart failure (HCC) 07/29/2021   Hyperglycemia due to type 2 diabetes mellitus (HCC) 05/04/2021   Chronic systolic heart failure (HCC) 04/09/2021   Hypoxia    Acute hypoxemic respiratory failure (HCC) 04/05/2021    Dyspnea 04/05/2021   Tremor 04/05/2021   Impaired fasting glucose 01/25/2021   Gastroesophageal reflux disease without esophagitis 01/25/2021   Mixed hyperlipidemia 01/25/2021   Morbid obesity (HCC) 01/25/2021   Prediabetes 01/25/2021   Tachycardia 01/25/2021   Pain in left knee 05/20/2020   Essential hypertension 10/08/2019   Uncontrolled diabetes mellitus 10/08/2019   UTI (urinary tract infection) 10/08/2019   Thrush 10/08/2019   Pneumonia due to COVID-19 virus 10/08/2019   Chronic respiratory failure with hypoxia (HCC) 10/02/2019   Acute respiratory distress syndrome (ARDS) due to COVID-19 virus (HCC) 10/02/2019   Pain in joint of right shoulder 07/15/2018   Pain in right foot 07/15/2018   Special screening for malignant neoplasms, colon 11/06/2016    PCP: Benita Stabile, MD  REFERRING PROVIDER: Dayna Ramus, FNP  REFERRING DIAG: [phone] (ICD-10-CM) - Unspecified fracture of left acetabulum, subsequent encounter for fracture with routine healing  THERAPY DIAG:  Pain in left hip  Stiffness of left hip, not elsewhere classified  Muscle weakness (generalized)  Difficulty in walking, not elsewhere classified  Rationale for Evaluation and Treatment: Rehabilitation  ONSET DATE: 03/16/23 ORIF  SUBJECTIVE:   SUBJECTIVE STATEMENT: No updates since last session, HEP performed without issue, no pain today. No other pertinent updates.   PERTINENT HISTORY: Per H&P on 05/10/23: 6/19 fall that caused a closed nondisplaced fracture of the acetabulum and underwent ORIF 6/21. she was  d/c on 6/25 to a SNF for PT/OT evaluation she was d/c with home PT which is now done but pt is doing home exercises.  PAIN:  Are you having pain? No   PRECAUTIONS: Fall  RED FLAGS: None   WEIGHT BEARING RESTRICTIONS: No  FALLS:  Has patient fallen in last 6 months? No  PATIENT GOALS: Return to walking without a/d  NEXT MD VISIT: 07/19/23  OBJECTIVE:    TODAY'S TREATMENT:                                                                                                                               DATE: 07/06/23  -AMB overground SPC RUE, 2 point sequencing 339ft, no pain  In //bars with red band loop at knee level  -lateral side stepping, alternating directions -retro AMB and forward high knee AMB alternating  -STS from chair x10, hands free  -seated deadlift in pain free range x10 (wide feet and knees)   -marching in place hands-free 1x50, alternating, cues to increase Left lateral weight shift over BOS on left side (success 25% of attempts thereafter)  -STS from chair x10, hands free -AMB overground c red TB around knees, SPC, minGuard assist    07/04/23:  Ambulate 235ft 2 point sequence with [facility] for warm up- cueing for core stability to reduce hips from rotating Standing:    Side step green TB around ankles x4 laps in // bar  SLS Rt 5", Lt 5" max of 3  Vector stance 3x 5"  Tandem stance 1x 30"; partial tandem stance on foam intermittent HHA 2x 30"  Squat front of chair  Stair training 4in reciprocal pattern ascend, step to descending.  Then 7in step height. 1RT each  06/29/23 Amb x 1 lap around gym with [facility] for warm up with focus on keeping hips from rotating Standing  Lumbar ext against counter x10  Gastroc stretch on slant board x30"  Soleus stretch on slant board x30"  Hip flexor stretch on 6" step x30"  Runner's step up 6" step 2x10 with L UE support  Side step up and over 6" step 2x10 Leg press double leg 3 plates [id number] Amb outside on sidewalk, CGA while going down small slope  06/26/23 Nustep L5 x 5 min UEs/LEs Standing  Hip flexor stretch on 4" step x30 sec R&L  Runner's step up on 4" step 2x10 R&L with bilat UE support  Step down 4" step 2x10 R&L with bilat UE support  Side step green TB around ankles x4 laps in // bars  Backwards stepping green TB around ankles x4 laps in // bars  Ambulating 2 laps around clinic no a/d with HHA   PATIENT  EDUCATION:  Education details: Exam findings, POC, initial HEP Person educated: Patient Education method: Explanation, Demonstration, and Handouts Education comprehension: verbalized understanding, returned demonstration, and needs further education  HOME EXERCISE PROGRAM: Access Code: [email or web address] URL: [email or web address] Date: 06/29/2023 Prepared by: Vernon Prey  April Marie Nonato  Exercises - Supine Figure 4 (Mirrored)  - 1 x daily - 7 x weekly - 2 sets - 30 sec hold - Seated Hip Internal Rotation AROM  - 1 x daily - 7 x weekly - 2 sets - 10 reps - Squat with Chair Touch  - 1 x daily - 7 x weekly - 2 sets - 10 reps - Runner's Step Up/Down  - 1 x daily - 7 x weekly - 2 sets - 10 reps - Side Stepping with Resistance at Ankles  - 1 x daily - 7 x weekly - 2 sets - 10 reps - Standing Lumbar Extension with Counter  - 1 x daily - 7 x weekly - 1 sets - 10 reps - Hip Flexor Stretch on Step  - 1 x daily - 7 x weekly - 2 sets - 30 sec hold  07/04/23: - Standing Tandem Balance with Counter Support  - 1-2 x daily - 7 x weekly - 1 sets - 3 reps - 30" hold - Single Leg Stance  - 1-2 x daily - 7 x weekly - 1 sets - 3 reps - 30" hold    ASSESSMENT:  CLINICAL IMPRESSION: Advancing strengthening interventions, advanced from open chain hip exercise to gait-based versions. Activiity tolerance continues to improve, however pain and antalgia quickly worsen over time and gait speed remains far below age-matched norms. Pt remains motivated to advance her  progress toward goals of treatment and I think skilled PT can help with that.    From eval: Patient is a 72 y.o. F who was seen today for physical therapy evaluation and treatment s/p L ORIF for acetabular fracture on 03/16/23. Assessment significant for L LE weakness, decreased ROM, pain, decreased tolerance to prolonged weight bearing, and decreased balance affecting standing, amb, and squatting limiting home and community tasks. Pt will benefit  from PT to address these deficits to return to PLOF.   OBJECTIVE IMPAIRMENTS: Abnormal gait, decreased activity tolerance, decreased balance, decreased endurance, decreased mobility, difficulty walking, decreased ROM, decreased strength, hypomobility, increased fascial restrictions, impaired flexibility, improper body mechanics, postural dysfunction, and pain.    GOALS: Goals reviewed with patient? Yes  SHORT TERM GOALS: Target date: 06/29/2023  Pt will be ind with initial HEP Baseline: Goal status: INITIAL  2.  Pt will demo L = R hip abduction to improve ease with car transfers and bed mobility Baseline:  Goal status: INITIAL  3.  Pt will improve L hip flexion to at least 110 deg to improve car transfers and bed mobility Baseline:  Goal status: MET  4. Pt will be able to perform SLR to demo increased L LE strength Baseline:  Goal status: MET   LONG TERM GOALS: Target date: 07/27/2023   Pt will be ind with progression and advancement of HEP Baseline:  Goal status: INITIAL  2.  Pt will have improved 5x STS to </=13 sec to demo decreased fall risk and improved functional LE strength Baseline:  Goal status: INITIAL  3.  Pt will be able to amb at least 150' without RW for home amb Baseline:  Goal status: INITIAL  4.  Pt will report LEFS of >/=28.8% to demo MCID Baseline:  Goal status: INITIAL   PLAN:  PT FREQUENCY: 2x/week  PT DURATION: 8 [place]  PLANNED INTERVENTIONS: Therapeutic exercises, Therapeutic activity, Neuromuscular re-education, Balance training, Gait training, Patient/Family education, Self Care, Joint mobilization, Stair training, Aquatic Therapy, Dry Needling, Electrical stimulation, Spinal mobilization, Cryotherapy, Moist heat,  Taping, Ionotophoresis 4mg /ml Dexamethasone, Manual therapy, and Re-evaluation  PLAN FOR NEXT SESSION: progress strengthening, weight bearing/balance.  Rosamaria Lints, PT Physical Therapist [facility] at  [facility]  [phone]   Avigail Pilling C, PT 07/06/2023, 10:35 AM

## 2023-07-09 DIAGNOSIS — D219 Benign neoplasm of connective and other soft tissue, unspecified: Secondary | ICD-10-CM | POA: Diagnosis not present

## 2023-07-10 ENCOUNTER — Ambulatory Visit (HOSPITAL_COMMUNITY): Payer: Medicare HMO

## 2023-07-10 DIAGNOSIS — M25652 Stiffness of left hip, not elsewhere classified: Secondary | ICD-10-CM

## 2023-07-10 DIAGNOSIS — M25552 Pain in left hip: Secondary | ICD-10-CM

## 2023-07-10 DIAGNOSIS — R262 Difficulty in walking, not elsewhere classified: Secondary | ICD-10-CM | POA: Diagnosis not present

## 2023-07-10 DIAGNOSIS — M6281 Muscle weakness (generalized): Secondary | ICD-10-CM | POA: Diagnosis not present

## 2023-07-10 NOTE — Therapy (Signed)
OUTPATIENT PHYSICAL THERAPY TREATMENT   Patient Name: Kristina [name] MRN: [id number] DOB:02/15/1951, 72 y.o., female Today's Date: 07/10/2023  END OF SESSION:  PT End of Session - 07/10/23 1019     Visit Number 10    Number of Visits 16    Date for PT Re-Evaluation 07/27/23    Authorization Type [employer]    Progress Note Due on Visit 10    PT Start Time 1018    PT Stop Time 1058    PT Time Calculation (min) 40 min    Activity Tolerance Patient tolerated treatment well;No increased pain    Behavior During Therapy WFL for tasks assessed/performed                Past Medical History:  Diagnosis Date   Acute respiratory distress syndrome (ARDS) due to COVID-19 virus (HCC) 09/2019   Chronic combined systolic and diastolic CHF (congestive heart failure) (HCC)    Diabetes mellitus, type 2 (HCC)    GERD (gastroesophageal reflux disease)    Hyperlipidemia    Hypertension    Past Surgical History:  Procedure Laterality Date   COLONOSCOPY N/A 12/21/2016   Procedure: COLONOSCOPY;  Surgeon: Malissa Hippo, MD;  [facility]: [facility];  Service: Endoscopy;  Laterality: N/A;  930   NO PAST SURGERIES     ORIF ACETABULAR FRACTURE Left 03/16/2023   Procedure: OPEN REDUCTION INTERNAL FIXATION (ORIF) ACETABULAR FRACTURE STOPPA APPROACH;  Surgeon: Roby Lofts, MD;  Location: [facility];  Service: Orthopedics;  Laterality: Left;   Patient Active Problem List   Diagnosis Date Noted   Chronic constipation 05/10/2023   Hemorrhoids 05/10/2023   Closed fracture of right acetabulum (HCC) 05/02/2023   Acetabular fracture (HCC) 03/14/2023   Candidiasis of skin 02/12/2023   OP (osteoporosis) 01/22/2023   Proteinuria 06/15/2022   Sleep apnea 09/08/2021   Congestive heart failure (HCC) 07/29/2021   Hyperglycemia due to type 2 diabetes mellitus (HCC) 05/04/2021   Chronic systolic heart failure (HCC) 04/09/2021   Hypoxia    Acute hypoxemic respiratory failure (HCC) 04/05/2021    Dyspnea 04/05/2021   Tremor 04/05/2021   Impaired fasting glucose 01/25/2021   Gastroesophageal reflux disease without esophagitis 01/25/2021   Mixed hyperlipidemia 01/25/2021   Morbid obesity (HCC) 01/25/2021   Prediabetes 01/25/2021   Tachycardia 01/25/2021   Pain in left knee 05/20/2020   Essential hypertension 10/08/2019   Uncontrolled diabetes mellitus 10/08/2019   UTI (urinary tract infection) 10/08/2019   Thrush 10/08/2019   Pneumonia due to COVID-19 virus 10/08/2019   Chronic respiratory failure with hypoxia (HCC) 10/02/2019   Acute respiratory distress syndrome (ARDS) due to COVID-19 virus (HCC) 10/02/2019   Pain in joint of right shoulder 07/15/2018   Pain in right foot 07/15/2018   Special screening for malignant neoplasms, colon 11/06/2016    PCP: Benita Stabile, MD  REFERRING PROVIDER: Dayna Ramus, FNP  REFERRING DIAG: [id number] (ICD-10-CM) - Unspecified fracture of left acetabulum, subsequent encounter for fracture with routine healing  THERAPY DIAG:  Pain in left hip  Muscle weakness (generalized)  Difficulty in walking, not elsewhere classified  Stiffness of left hip, not elsewhere classified  Rationale for Evaluation and Treatment: Rehabilitation  ONSET DATE: 03/16/23 ORIF  SUBJECTIVE:   SUBJECTIVE STATEMENT: Feeling "pretty good"; no complaint of pain.  "Stiffness" is my biggest issue now.   Haven't taken any tylenol in 2 days.  She was able to drive herself today and it's the first day she has come to therapy  alone.    PERTINENT HISTORY: Per H&P on 05/10/23: 6/19 fall that caused a closed nondisplaced fracture of the acetabulum and underwent ORIF 6/21. she was d/c on 6/25 to a SNF for PT/OT evaluation she was d/c with home PT which is now done but pt is doing home exercises.  PAIN:  Are you having pain? No   PRECAUTIONS: Fall  RED FLAGS: None   WEIGHT BEARING RESTRICTIONS: No  FALLS:  Has patient fallen in last 6 months? No  PATIENT  GOALS: Return to walking without a/d  NEXT MD VISIT: 07/19/23  OBJECTIVE:    TODAY'S TREATMENT:                                                                                                                              DATE:  07/10/23 Sit to stand x 10 no UE assist Standing: Toe raises 2 x 10 on incline  Heel raises 2 x 10 on decline Tandem stance 30" x 2 Rhythmic stabilization on foam pad 2 x 30" Hip vectors 2" hold x 8 each 7" step ups 2 x 10 Nustep seat 5 x 5' level 3 for conditioning   07/06/23 -AMB overground SPC RUE, 2 point sequencing 387ft, no pain  In //bars with red band loop at knee level  -lateral side stepping, alternating directions -retro AMB and forward high knee AMB alternating  -STS from chair x10, hands free  -seated deadlift in pain free range x10 (wide feet and knees)   -marching in place hands-free 1x50, alternating, cues to increase Left lateral weight shift over BOS on left side (success 25% of attempts thereafter)  -STS from chair x10, hands free -AMB overground c red TB around knees, SPC, minGuard assist    07/04/23:  Ambulate 219ft 2 point sequence with [facility] for warm up- cueing for core stability to reduce hips from rotating Standing:    Side step green TB around ankles x4 laps in // bar  SLS Rt 5", Lt 5" max of 3  Vector stance 3x 5"  Tandem stance 1x 30"; partial tandem stance on foam intermittent HHA 2x 30"  Squat front of chair  Stair training 4in reciprocal pattern ascend, step to descending.  Then 7in step height. 1RT each  06/29/23 Amb x 1 lap around gym with [facility] for warm up with focus on keeping hips from rotating Standing  Lumbar ext against counter x10  Gastroc stretch on slant board x30"  Soleus stretch on slant board x30"  Hip flexor stretch on 6" step x30"  Runner's step up 6" step 2x10 with L UE support  Side step up and over 6" step 2x10 Leg press double leg 3 plates [id number] Amb outside on sidewalk, CGA while going down  small slope  06/26/23 Nustep L5 x 5 min UEs/LEs Standing  Hip flexor stretch on 4" step x30 sec R&L  Runner's step up on 4" step 2x10 R&L with bilat UE support  Step down 4" step 2x10 R&L with bilat UE support  Side step green TB around ankles x4 laps in // bars  Backwards stepping green TB around ankles x4 laps in // bars  Ambulating 2 laps around clinic no a/d with HHA   PATIENT EDUCATION:  Education details: Exam findings, POC, initial HEP Person educated: Patient Education method: Explanation, Demonstration, and Handouts Education comprehension: verbalized understanding, returned demonstration, and needs further education  HOME EXERCISE PROGRAM: Access Code: [email or web address] URL: [email or web address] Date: 06/29/2023 Prepared by: Vernon Prey April Kirstie Peri  Exercises - Supine Figure 4 (Mirrored)  - 1 x daily - 7 x weekly - 2 sets - 30 sec hold - Seated Hip Internal Rotation AROM  - 1 x daily - 7 x weekly - 2 sets - 10 reps - Squat with Chair Touch  - 1 x daily - 7 x weekly - 2 sets - 10 reps - Runner's Step Up/Down  - 1 x daily - 7 x weekly - 2 sets - 10 reps - Side Stepping with Resistance at Ankles  - 1 x daily - 7 x weekly - 2 sets - 10 reps - Standing Lumbar Extension with Counter  - 1 x daily - 7 x weekly - 1 sets - 10 reps - Hip Flexor Stretch on Step  - 1 x daily - 7 x weekly - 2 sets - 30 sec hold  07/04/23: - Standing Tandem Balance with Counter Support  - 1-2 x daily - 7 x weekly - 1 sets - 3 reps - 30" hold - Single Leg Stance  - 1-2 x daily - 7 x weekly - 1 sets - 3 reps - 30" hold    ASSESSMENT:  CLINICAL IMPRESSION: Today's session continued to work on lower extremity strengthening, gait and balance.  Sit to stand with noted trunk shift to the right but improves with verbal cues. Able to walk in gym today without SPC but needs to concentrate for good form.   Improving performance with tandem stance; still needs 2 hand assist to perform step ups.   Patient will benefit from continued skilled therapy services to address deficits and promote return to optimal function.       From eval: Patient is a 72 y.o. F who was seen today for physical therapy evaluation and treatment s/p L ORIF for acetabular fracture on 03/16/23. Assessment significant for L LE weakness, decreased ROM, pain, decreased tolerance to prolonged weight bearing, and decreased balance affecting standing, amb, and squatting limiting home and community tasks. Pt will benefit from PT to address these deficits to return to PLOF.   OBJECTIVE IMPAIRMENTS: Abnormal gait, decreased activity tolerance, decreased balance, decreased endurance, decreased mobility, difficulty walking, decreased ROM, decreased strength, hypomobility, increased fascial restrictions, impaired flexibility, improper body mechanics, postural dysfunction, and pain.    GOALS: Goals reviewed with patient? Yes  SHORT TERM GOALS: Target date: 06/29/2023  Pt will be ind with initial HEP Baseline: Goal status: INITIAL  2.  Pt will demo L = R hip abduction to improve ease with car transfers and bed mobility Baseline:  Goal status: INITIAL  3.  Pt will improve L hip flexion to at least 110 deg to improve car transfers and bed mobility Baseline:  Goal status: MET  4. Pt will be able to perform SLR to demo increased L LE strength Baseline:  Goal status: MET   LONG TERM GOALS: Target date: 07/27/2023   Pt will be ind with progression and advancement of HEP  Baseline:  Goal status: INITIAL  2.  Pt will have improved 5x STS to </=13 sec to demo decreased fall risk and improved functional LE strength Baseline:  Goal status: INITIAL  3.  Pt will be able to amb at least 150' without RW for home amb Baseline:  Goal status: INITIAL  4.  Pt will report LEFS of >/=28.8% to demo MCID Baseline:  Goal status: INITIAL   PLAN:  PT FREQUENCY: 2x/week  PT DURATION: 8 [place]  PLANNED INTERVENTIONS:  Therapeutic exercises, Therapeutic activity, Neuromuscular re-education, Balance training, Gait training, Patient/Family education, Self Care, Joint mobilization, Stair training, Aquatic Therapy, Dry Needling, Electrical stimulation, Spinal mobilization, Cryotherapy, Moist heat, Taping, Ionotophoresis 4mg /ml Dexamethasone, Manual therapy, and Re-evaluation  PLAN FOR NEXT SESSION: progress strengthening, weight bearing/balance.  10:58 AM, 07/10/23 Chuong Casebeer Small Mattie Novosel MPT [place] [facility] [place] [phone]

## 2023-07-12 DIAGNOSIS — S32402D Unspecified fracture of left acetabulum, subsequent encounter for fracture with routine healing: Secondary | ICD-10-CM | POA: Diagnosis not present

## 2023-07-12 DIAGNOSIS — R2689 Other abnormalities of gait and mobility: Secondary | ICD-10-CM | POA: Diagnosis not present

## 2023-07-13 ENCOUNTER — Ambulatory Visit (HOSPITAL_COMMUNITY): Payer: Medicare HMO

## 2023-07-13 DIAGNOSIS — M25552 Pain in left hip: Secondary | ICD-10-CM

## 2023-07-13 DIAGNOSIS — M25652 Stiffness of left hip, not elsewhere classified: Secondary | ICD-10-CM

## 2023-07-13 DIAGNOSIS — R262 Difficulty in walking, not elsewhere classified: Secondary | ICD-10-CM

## 2023-07-13 DIAGNOSIS — M6281 Muscle weakness (generalized): Secondary | ICD-10-CM | POA: Diagnosis not present

## 2023-07-13 NOTE — Therapy (Signed)
OUTPATIENT PHYSICAL THERAPY PROGRESS NOTE   Patient Name: Kristina [name] MRN: [id number] DOB:05/12/1951, 72 y.o., female Today's Date: 07/13/2023  END OF SESSION:  PT End of Session - 07/13/23 1448     Visit Number 11    Number of Visits 16    Date for PT Re-Evaluation 07/27/23    Authorization Type [employer] Medicare    Progress Note Due on Visit 10    PT Start Time 1345    PT Stop Time 1425    PT Time Calculation (min) 40 min    Activity Tolerance Patient tolerated treatment well;No increased pain    Behavior During Therapy WFL for tasks assessed/performed            Past Medical History:  Diagnosis Date   Acute respiratory distress syndrome (ARDS) due to COVID-19 virus (HCC) 09/2019   Chronic combined systolic and diastolic CHF (congestive heart failure) (HCC)    Diabetes mellitus, type 2 (HCC)    GERD (gastroesophageal reflux disease)    Hyperlipidemia    Hypertension    Past Surgical History:  Procedure Laterality Date   COLONOSCOPY N/A 12/21/2016   Procedure: COLONOSCOPY;  Surgeon: Malissa Hippo, MD;  [facility]: [facility];  Service: Endoscopy;  Laterality: N/A;  930   NO PAST SURGERIES     ORIF ACETABULAR FRACTURE Left 03/16/2023   Procedure: OPEN REDUCTION INTERNAL FIXATION (ORIF) ACETABULAR FRACTURE STOPPA APPROACH;  Surgeon: Roby Lofts, MD;  Location: [facility];  Service: Orthopedics;  Laterality: Left;   Patient Active Problem List   Diagnosis Date Noted   Chronic constipation 05/10/2023   Hemorrhoids 05/10/2023   Closed fracture of right acetabulum (HCC) 05/02/2023   Acetabular fracture (HCC) 03/14/2023   Candidiasis of skin 02/12/2023   OP (osteoporosis) 01/22/2023   Proteinuria 06/15/2022   Sleep apnea 09/08/2021   Congestive heart failure (HCC) 07/29/2021   Hyperglycemia due to type 2 diabetes mellitus (HCC) 05/04/2021   Chronic systolic heart failure (HCC) 04/09/2021   Hypoxia    Acute hypoxemic respiratory failure (HCC) 04/05/2021   Dyspnea  04/05/2021   Tremor 04/05/2021   Impaired fasting glucose 01/25/2021   Gastroesophageal reflux disease without esophagitis 01/25/2021   Mixed hyperlipidemia 01/25/2021   Morbid obesity (HCC) 01/25/2021   Prediabetes 01/25/2021   Tachycardia 01/25/2021   Pain in left knee 05/20/2020   Essential hypertension 10/08/2019   Uncontrolled diabetes mellitus 10/08/2019   UTI (urinary tract infection) 10/08/2019   Thrush 10/08/2019   Pneumonia due to COVID-19 virus 10/08/2019   Chronic respiratory failure with hypoxia (HCC) 10/02/2019   Acute respiratory distress syndrome (ARDS) due to COVID-19 virus (HCC) 10/02/2019   Pain in joint of right shoulder 07/15/2018   Pain in right foot 07/15/2018   Special screening for malignant neoplasms, colon 11/06/2016   Progress Note Reporting Period 06/01/23 to 07/13/23  See note below for Objective Data and Assessment of Progress/Goals.   PCP: Benita Stabile, MD  REFERRING PROVIDER: Dayna Ramus, FNP  REFERRING DIAG: [id number] (ICD-10-CM) - Unspecified fracture of left acetabulum, subsequent encounter for fracture with routine healing  THERAPY DIAG:  Pain in left hip  Muscle weakness (generalized)  Difficulty in walking, not elsewhere classified  Stiffness of left hip, not elsewhere classified  Rationale for Evaluation and Treatment: Rehabilitation  ONSET DATE: 03/16/23 ORIF  SUBJECTIVE:   SUBJECTIVE STATEMENT: Patient states that she is around 75% better. Patient states that she feels a little stiff when she gets up from a chair. Still uses  a cane when walking outdoors. Denies any pain or recent falls. Patient thinks that she can be ready for D/C. Doesn't use a walker when walking indoors. Patient states that she's close to her baseline functional status when it comes to mobility and ambulation.  PERTINENT HISTORY: Per H&P on 05/10/23: 6/19 fall that caused a closed nondisplaced fracture of the acetabulum and underwent ORIF 6/21. she was  d/c on 6/25 to a SNF for PT/OT evaluation she was d/c with home PT which is now done but pt is doing home exercises.  PAIN:  Are you having pain? No   PRECAUTIONS: Fall  RED FLAGS: None   WEIGHT BEARING RESTRICTIONS: No  FALLS:  Has patient fallen in last 6 months? No  PATIENT GOALS: Return to walking without a/d  NEXT MD VISIT: 07/19/23  OBJECTIVE: All findings are from initial evaluation unless dated DIAGNOSTIC FINDINGS:  6/21 pelvis x-ray IMPRESSION: Status post surgical internal fixation of left acetabular fracture. Continued presence of mildly displaced left inferior pubic ramus fracture.   PATIENT SURVEYS:  Lower Extremity Functional Score: 07/13/23: 46/80 = 57.5% from 15 / 80 = 18.8 %   EDEMA:  "L foot stays swollen all the time since surgery"   MUSCLE LENGTH: Hamstrings: did not assess Thomas test: Right 10 deg; Left 0 deg   POSTURE: rounded shoulders and flexed trunk    PALPATION: TTP L Hip   LOWER EXTREMITY ROM:   Active ROM Right eval Left eval Left 06/12/23  Left 07/13/23  Hip flexion 115 100 95 in supine 115  Hip extension 10 0   20  Hip abduction 20 15   40  Hip adduction         Hip internal rotation         Hip external rotation         Knee flexion         Knee extension         Ankle dorsiflexion         Ankle plantarflexion         Ankle inversion         Ankle eversion          (Blank rows = not tested)   LOWER EXTREMITY MMT:   MMT Right eval Left eval Left 07/13/23  Hip flexion 5 4 4   Hip extension 4 3+ 4  Hip abduction 4 3+ 4  Hip adduction       Hip internal rotation       Hip external rotation       Knee flexion 5 5 5   Knee extension 5 Unable to perform SLR 5 Able to perform SLR  Ankle dorsiflexion       Ankle plantarflexion       Ankle inversion       Ankle eversion        (Blank rows = not tested)   LOWER EXTREMITY SPECIAL TESTS:  Did not assess   FUNCTIONAL TESTS:  5 times sit to stand: 07/13/23 8.73 sec  from 17.4 sec (with UE support) Gait speed 07/13/23 0.64 m/sec from 0.58 m/sec     GAIT: 07/13/23 Distance walked: 227 Assistive device utilized:  none Level of assistance: complete independence Comments: normal step length on B, no evidence of loss of balance  TODAY'S TREATMENT:  DATE:  07/13/23 Progress note (5TSTS, ROM, MMT, gait speed, LEFS) Education on measures to reduce falls at home - patient gave excellent verbal understanding  07/10/23 Sit to stand x 10 no UE assist Standing: Toe raises 2 x 10 on incline  Heel raises 2 x 10 on decline Tandem stance 30" x 2 Rhythmic stabilization on foam pad 2 x 30" Hip vectors 2" hold x 8 each 7" step ups 2 x 10 Nustep seat 5 x 5' level 3 for conditioning   07/06/23 -AMB overground SPC RUE, 2 point sequencing 312ft, no pain  In //bars with red band loop at knee level  -lateral side stepping, alternating directions -retro AMB and forward high knee AMB alternating  -STS from chair x10, hands free  -seated deadlift in pain free range x10 (wide feet and knees)   -marching in place hands-free 1x50, alternating, cues to increase Left lateral weight shift over BOS on left side (success 25% of attempts thereafter)  -STS from chair x10, hands free -AMB overground c red TB around knees, SPC, minGuard assist    07/04/23:  Ambulate 215ft 2 point sequence with [facility] for warm up- cueing for core stability to reduce hips from rotating Standing:    Side step green TB around ankles x4 laps in // bar  SLS Rt 5", Lt 5" max of 3  Vector stance 3x 5"  Tandem stance 1x 30"; partial tandem stance on foam intermittent HHA 2x 30"  Squat front of chair  Stair training 4in reciprocal pattern ascend, step to descending.  Then 7in step height. 1RT each  06/29/23 Amb x 1 lap around gym with [facility] for warm up with focus on keeping  hips from rotating Standing  Lumbar ext against counter x10  Gastroc stretch on slant board x30"  Soleus stretch on slant board x30"  Hip flexor stretch on 6" step x30"  Runner's step up 6" step 2x10 with L UE support  Side step up and over 6" step 2x10 Leg press double leg 3 plates 1O10 Amb outside on sidewalk, CGA while going down small slope  06/26/23 Nustep L5 x 5 min UEs/LEs Standing  Hip flexor stretch on 4" step x30 sec R&L  Runner's step up on 4" step 2x10 R&L with bilat UE support  Step down 4" step 2x10 R&L with bilat UE support  Side step green TB around ankles x4 laps in // bars  Backwards stepping green TB around ankles x4 laps in // bars  Ambulating 2 laps around clinic no a/d with HHA   PATIENT EDUCATION:  Education details: Exam findings, POC, initial HEP Person educated: Patient Education method: Explanation, Demonstration, and Handouts Education comprehension: verbalized understanding, returned demonstration, and needs further education  HOME EXERCISE PROGRAM: Access Code: [email or web address] URL: [email or web address] Date: 06/29/2023 Prepared by: Vernon Prey April Kirstie Peri  Exercises - Supine Figure 4 (Mirrored)  - 1 x daily - 7 x weekly - 2 sets - 30 sec hold - Seated Hip Internal Rotation AROM  - 1 x daily - 7 x weekly - 2 sets - 10 reps - Squat with Chair Touch  - 1 x daily - 7 x weekly - 2 sets - 10 reps - Runner's Step Up/Down  - 1 x daily - 7 x weekly - 2 sets - 10 reps - Side Stepping with Resistance at Ankles  - 1 x daily - 7 x weekly - 2 sets - 10 reps - Standing Lumbar Extension with Counter  - 1 x  daily - 7 x weekly - 1 sets - 10 reps - Hip Flexor Stretch on Step  - 1 x daily - 7 x weekly - 2 sets - 30 sec hold  07/04/23: - Standing Tandem Balance with Counter Support  - 1-2 x daily - 7 x weekly - 1 sets - 3 reps - 30" hold - Single Leg Stance  - 1-2 x daily - 7 x weekly - 1 sets - 3 reps - 30" hold    ASSESSMENT:  CLINICAL  IMPRESSION: Patient demonstrated continued improvements in function as indicated by positive significant changes in LEFS, ROM, MMT, 5TSTS and gait speed. Patient is also able to do ambulation without for around 227 ft without an assistive device and no loss of balance. With this, skilled PT is not required at this time and patient is D/C from PT services. Patient can just do her HEP to facilitate carry-over of the gains in PT.  From eval: Patient is a 72 y.o. F who was seen today for physical therapy evaluation and treatment s/p L ORIF for acetabular fracture on 03/16/23. Assessment significant for L LE weakness, decreased ROM, pain, decreased tolerance to prolonged weight bearing, and decreased balance affecting standing, amb, and squatting limiting home and community tasks. Pt will benefit from PT to address these deficits to return to PLOF.   OBJECTIVE IMPAIRMENTS: Abnormal gait, decreased activity tolerance, decreased balance, decreased endurance, decreased mobility, difficulty walking, decreased ROM, decreased strength, hypomobility, increased fascial restrictions, impaired flexibility, improper body mechanics, postural dysfunction, and pain.    GOALS: Goals reviewed with patient? Yes  SHORT TERM GOALS: Target date: 06/29/2023  Pt will be ind with initial HEP Baseline: Goal status: MET  2.  Pt will demo L = R hip abduction to improve ease with car transfers and bed mobility Baseline: R > L Goal status: MET  3.  Pt will improve L hip flexion to at least 110 deg to improve car transfers and bed mobility Baseline: 100 deg Goal status: MET  4. Pt will be able to perform SLR to demo increased L LE strength Baseline: unable to perform Goal status: MET   LONG TERM GOALS: Target date: 07/27/2023   Pt will be ind with progression and advancement of HEP Baseline:  Goal status: MET  2.  Pt will have improved 5x STS to </=13 sec to demo decreased fall risk and improved functional LE  strength Baseline: 17.4 sec Goal status: MET  3.  Pt will be able to amb at least 150' without RW for home amb Baseline: 150 ft with rolling walker Goal status: MET  4.  Pt will report LEFS of >/=28.8% to demo MCID Baseline: 18.% Goal status: MET   PLAN:  PT FREQUENCY:  0  PT DURATION: other: 0  PLANNED INTERVENTIONS:  D/C from skilled PT. D/C to HEP  PLAN FOR NEXT SESSION: progress strengthening, weight bearing/balance.   PHYSICAL THERAPY DISCHARGE SUMMARY  Visits from Start of Care: 11  Current functional level related to goals / functional outcomes: See above   Remaining deficits: See above   Education / Equipment: See above   Patient agrees to discharge. Patient goals were met. Patient is being discharged due to meeting the stated rehab goals.   Tish Frederickson. Omir Cooprider, PT, DPT, OCS Board-Certified Clinical Specialist in Orthopedic PT PT Compact Privilege # ([place]): [phone] T  2:50 PM, 07/13/23

## 2023-07-17 ENCOUNTER — Encounter (HOSPITAL_COMMUNITY): Payer: Medicare HMO

## 2023-07-18 ENCOUNTER — Ambulatory Visit: Payer: Self-pay | Admitting: *Deleted

## 2023-07-18 NOTE — Patient Outreach (Signed)
Care Coordination   Follow Up Visit Note   07/18/2023 Name: Kristina [name] MRN: [id number] DOB: 06-Aug-1951  Kristina [name] is a 72 y.o. year old female who sees Margo Aye, Kathleene Hazel, MD for primary care. I spoke with  Bobbe Medico Friedland by phone today.  What matters to the patients health and wellness today?   Swelling of legs, [facility], urinary symptoms, falls since June 2024   congestive Heart Failure (CHF)- denies further swelling of her legs and no worsening CHF symptoms   Released from [facility] last week 07/04/23 No UTI or urinary symptoms, No falls since June 2024   OB GYN visit -small uterine fibroid, no treatment at this time, to be monitored for absorption Patient asked questions about symptoms to monitor for and standard treatment for fibroid. She now voices understanding of the possible origin some of previous urinary frequency symptoms  Agrees to being mailed education on uterine fibroids  Diabetes- reports HgA1c remains below 7 and at home it is managed with diet, not monitoring at home       Goals Addressed             This Visit's Progress    Manage her congestive heart failure, montior for symptoms of fibroid - care coordination services       Interventions Today    Flowsheet Row Most Recent Value  Chronic Disease   Chronic disease during today's visit Diabetes, Congestive Heart Failure (CHF), Other  [uterine fibroid, falls, urinary symptoms]  General Interventions   General Interventions Discussed/Reviewed General Interventions Reviewed, Labs, Durable Medical Equipment (DME), Doctor Visits  Labs Hgb A1c every 3 months  Doctor Visits Discussed/Reviewed Doctor Visits Reviewed, PCP, Specialist  Durable Medical Equipment (DME) Other, Oxygen  [confirmed not on continuous oxygen and monitors her oxygen saturation with her pulse oximeter]  PCP/Specialist Visits Compliance with follow-up visit  Exercise Interventions   Exercise Discussed/Reviewed Exercise Reviewed, Physical  Activity, Weight Managment, Assistive device use and maintanence  Physical Activity Discussed/Reviewed Physical Activity Reviewed, Types of exercise, Yvone Neu is now going to the gym, [employer], walking since released from out patient rehabs ([facility])]  Weight Management Weight maintenance  Education Interventions   Education Provided Provided Therapist, sports, Provided Education  [uterine fibroids]  Provided Verbal Education On Blood Sugar Monitoring, Mental Health/Coping with Illness, When to see the doctor, Other  [fibroid symptoms & treatment]  Mental Health Interventions   Mental Health Discussed/Reviewed Mental Health Reviewed, Coping Strategies  Pharmacy Interventions   Pharmacy Dicussed/Reviewed Pharmacy Topics Reviewed, Affording Medications  Safety Interventions   Safety Discussed/Reviewed Safety Reviewed, Fall Risk              SDOH assessments and interventions completed:  No     Care Coordination Interventions:  Yes, provided   Follow up plan: Follow up call scheduled for 08/20/23    Encounter Outcome:  Patient Visit Completed   Cala Bradford L. Noelle Penner, RN, BSN, [facility]  VBCI Care Management Coordinator  [phone]  Fax: [phone]

## 2023-07-18 NOTE — Patient Instructions (Addendum)
Visit Information  Thank you for taking time to visit with me today. Please don't hesitate to contact me if I can be of assistance to you.   Following are the goals we discussed today:   Goals Addressed             This Visit's Progress    Manage her congestive heart failure, montior for symptoms of fibroid - care coordination services       Interventions Today    Flowsheet Row Most Recent Value  Chronic Disease   Chronic disease during today's visit Diabetes, Congestive Heart Failure (CHF), Other  [uterine fibroid, falls, urinary symptoms]  General Interventions   General Interventions Discussed/Reviewed General Interventions Reviewed, Labs, Durable Medical Equipment (DME), Doctor Visits  Labs Hgb A1c every 3 months  Doctor Visits Discussed/Reviewed Doctor Visits Reviewed, PCP, Specialist  Durable Medical Equipment (DME) Other, Oxygen  [confirmed not on continuous oxygen and monitors her oxygen saturation with her pulse oximeter]  PCP/Specialist Visits Compliance with follow-up visit  Exercise Interventions   Exercise Discussed/Reviewed Exercise Reviewed, Physical Activity, Weight Managment, Assistive device use and maintanence  Physical Activity Discussed/Reviewed Physical Activity Reviewed, Types of exercise, Yvone Neu is now going to the gym, [employer], walking since released from out patient rehabs ([facility])]  Weight Management Weight maintenance  Education Interventions   Education Provided Provided Printed Education, Provided Education  [uterine fibroids]  Provided Verbal Education On Blood Sugar Monitoring, Mental Health/Coping with Illness, When to see the doctor, Other  [fibroid symptoms & treatment]  Mental Health Interventions   Mental Health Discussed/Reviewed Mental Health Reviewed, Coping Strategies  Pharmacy Interventions   Pharmacy Dicussed/Reviewed Pharmacy Topics Reviewed, Affording Medications  Safety Interventions   Safety Discussed/Reviewed Safety Reviewed, Fall Risk               Our next appointment is by telephone on 08/20/23 at 0930  Please call the care guide team at [phone] if you need to cancel or reschedule your appointment.   If you are experiencing a Mental Health or Behavioral Health Crisis or need someone to talk to, please call the Suicide and Crisis Lifeline: 988 call the [place] National Suicide Prevention Lifeline: 629-464-1766 or TTY: 425-593-8375 TTY 206-640-1494) to talk to a trained counselor call 1-800-273-TALK (toll free, 24 hour hotline) call the [facility]: 6462000090 call 911   No computer access, no preference for copy of AVS     The patient has been provided with contact information for the care management team and has been advised to call with any health related questions or concerns.   Jalana Moore L. Noelle Penner, RN, BSN, [facility]  VBCI Care Management Coordinator  [phone]  Fax: [phone]

## 2023-07-19 ENCOUNTER — Encounter: Payer: Self-pay | Admitting: Cardiology

## 2023-07-19 ENCOUNTER — Ambulatory Visit: Payer: Medicare HMO | Attending: Cardiology | Admitting: Cardiology

## 2023-07-19 VITALS — BP 150/90 | HR 85 | Ht <= 58 in | Wt 186.8 lb

## 2023-07-19 DIAGNOSIS — E782 Mixed hyperlipidemia: Secondary | ICD-10-CM | POA: Diagnosis not present

## 2023-07-19 DIAGNOSIS — I10 Essential (primary) hypertension: Secondary | ICD-10-CM | POA: Diagnosis not present

## 2023-07-19 DIAGNOSIS — I5032 Chronic diastolic (congestive) heart failure: Secondary | ICD-10-CM

## 2023-07-19 MED ORDER — EMPAGLIFLOZIN 10 MG PO TABS: 10.0000 mg | ORAL_TABLET | Freq: Every day | ORAL | 11 refills | Status: DC

## 2023-07-19 NOTE — Progress Notes (Signed)
Clinical Summary Kristina [name] is a 72 y.o.female seen today for follow up of the following medical problems.     1.Chronic diastolic HF - 03/2021 echo [id number]%, grade I dd, normal RV function - admit 03/2021 volume overload, diuresed     - no recent LE edema, no SOB/DOE - compliant with lasix.     - chronic SOB/DOE which is stable - has home O2 prn, rarely needs.  - some recent LE edema, mainly left sided where she had recent hip surgery    2. HTN - reports white coat HTN - has not checked bp's at home recently      3. Restrictive lung disease - secondary to obesity - she is on 2L [place] at home with exertion and sleep   4. OSA - followed by Dr Craige Cotta, sleep test showed mod to severe OSA - compliant cpap   5. Hyperlipidemia - labs followed by pcp  10/2021 TC 141 TG 160 HDL 45 LDL 69 - 05/2023 TC 295 TG 284 HDL 48 LDL 57   6. Hip fracture 02/2023 - admission 02/2023 with fall, s/p ORIF   Daughter is Alice Reichert also a patient of mine       Past Medical History:  Diagnosis Date   Acute respiratory distress syndrome (ARDS) due to COVID-19 virus (HCC) 09/2019   Chronic combined systolic and diastolic CHF (congestive heart failure) (HCC)    Diabetes mellitus, type 2 (HCC)    GERD (gastroesophageal reflux disease)    Hyperlipidemia    Hypertension      No Known Allergies   Current Outpatient Medications  Medication Sig Dispense Refill   acetaminophen (TYLENOL) 325 MG tablet Take 2 tablets (650 mg total) by mouth every 6 (six) hours as needed for mild pain (or Fever >/= 101). 12 tablet 0   albuterol (PROVENTIL) (2.5 MG/3ML) 0.083% nebulizer solution Take 3 mLs (2.5 mg total) by nebulization every 6 (six) hours as needed for wheezing or shortness of breath. 75 mL 12   amLODipine (NORVASC) 10 MG tablet Take 1 tablet (10 mg total) by mouth daily. 30 tablet 3   apixaban (ELIQUIS) 2.5 MG TABS tablet Take 1 tablet (2.5 mg total) by mouth 2 (two) times daily. 60 tablet  0   carvedilol (COREG) 6.25 MG tablet Take 1 tablet (6.25 mg total) by mouth 2 (two) times daily. 180 tablet 0   Cholecalciferol (VITAMIN D) 50 MCG (2000 UT) CAPS Take 1 capsule (2,000 Units total) by mouth daily. 90 capsule 0   docusate sodium (COLACE) 100 MG capsule Take 1 capsule (100 mg total) by mouth 2 (two) times daily. 10 capsule 0   feeding supplement (ENSURE ENLIVE / ENSURE PLUS) LIQD Take 237 mLs by mouth daily. 237 mL 12   furosemide (LASIX) 40 MG tablet Take 1 tablet (40 mg total) by mouth daily. 30 tablet 2   HYDROcodone-acetaminophen (NORCO/VICODIN) 5-325 MG tablet Take 1-2 tablets by mouth every 6 (six) hours as needed for moderate pain or severe pain. 10 tablet 0   losartan (COZAAR) 100 MG tablet Take 1 tablet (100 mg total) by mouth daily. 30 tablet 5   methocarbamol (ROBAXIN) 500 MG tablet Take 1 tablet (500 mg total) by mouth every 6 (six) hours as needed for muscle spasms. 10 tablet 0   Multiple Vitamin (MULTIVITAMIN) tablet Take 1 tablet by mouth daily.     omeprazole (PRILOSEC) 40 MG capsule Take 40 mg by mouth daily.  rosuvastatin (CRESTOR) 10 MG tablet Take 10 mg by mouth daily.     No current facility-administered medications for this visit.     Past Surgical History:  Procedure Laterality Date   COLONOSCOPY N/A 12/21/2016   Procedure: COLONOSCOPY;  Surgeon: Malissa Hippo, MD;  Location: [facility];  Service: Endoscopy;  Laterality: N/A;  930   NO PAST SURGERIES     ORIF ACETABULAR FRACTURE Left 03/16/2023   Procedure: OPEN REDUCTION INTERNAL FIXATION (ORIF) ACETABULAR FRACTURE STOPPA APPROACH;  Surgeon: Roby Lofts, MD;  Location: [facility] OR;  Service: Orthopedics;  Laterality: Left;     No Known Allergies    Family History  Problem Relation Age of Onset   Breast cancer Mother    Heart attack Father    Colon cancer Neg Hx      Social History Kristina [name] reports that she has never smoked. She has never used smokeless tobacco. Kristina [name] reports no  history of alcohol use.   Review of Systems CONSTITUTIONAL: No weight loss, fever, chills, weakness or fatigue.  HEENT: Eyes: No visual loss, blurred vision, double vision or yellow sclerae.No hearing loss, sneezing, congestion, runny nose or sore throat.  SKIN: No rash or itching.  CARDIOVASCULAR: per hpi RESPIRATORY: No shortness of breath, cough or sputum.  GASTROINTESTINAL: No anorexia, nausea, vomiting or diarrhea. No abdominal pain or blood.  GENITOURINARY: No burning on urination, no polyuria NEUROLOGICAL: No headache, dizziness, syncope, paralysis, ataxia, numbness or tingling in the extremities. No change in bowel or bladder control.  MUSCULOSKELETAL: No muscle, back pain, joint pain or stiffness.  LYMPHATICS: No enlarged nodes. No history of splenectomy.  PSYCHIATRIC: No history of depression or anxiety.  ENDOCRINOLOGIC: No reports of sweating, cold or heat intolerance. No polyuria or polydipsia.  Marland Kitchen   Physical Examination Today's Vitals   07/19/23 0809 07/19/23 0839  BP: (!) 150/90 (!) 150/90  Pulse: 85   SpO2: 91%   Weight: 186 lb 12.8 oz (84.7 kg)   Height: 4\' 10"  (1.473 m)    Body mass index is 39.04 kg/m.  Gen: resting comfortably, no acute distress HEENT: no scleral icterus, pupils equal round and reactive, no palptable cervical adenopathy,  CV: RRR, no m/r,g no jvd Resp: Clear to auscultation bilaterally GI: abdomen is soft, non-tender, non-distended, normal bowel sounds, no hepatosplenomegaly MSK: extremities are warm, no edema.  Skin: warm, no rash Neuro:  no focal deficits Psych: appropriate affect   Diagnostic Studies     Assessment and Plan  1.Chronic HFpEF - euvolemic today, continue lasix - in setting of HFpEF start jardiance 10mg  daily.    2. HTN -history of white coat HTN. BP elevated here, she will call us Monday with home BP's.      3. Hyperlipidemia LDL at goal, we disucssed dietary changes to lower TGs   F/u 6  months     Antoine Poche, M.D.

## 2023-07-19 NOTE — Patient Instructions (Addendum)
Medication Instructions:  Your physician has recommended you make the following change in your medication:   -Start Jardiance 10 mg tablet by mouth once daily.   *If you need a refill on your cardiac medications before your next appointment, please call your pharmacy*   Lab Work: None If you have labs (blood work) drawn today and your tests are completely normal, you will receive your results only by: MyChart Message (if you have MyChart) OR A paper copy in the mail If you have any lab test that is abnormal or we need to change your treatment, we will call you to review the results.   Testing/Procedures: None   Follow-Up: At [facility], you and your health needs are our priority.  As part of our continuing mission to provide you with exceptional heart care, we have created designated Provider Care Teams.  These Care Teams include your primary Cardiologist (physician) and Advanced Practice Providers (APPs -  Physician Assistants and Nurse Practitioners) who all work together to provide you with the care you need, when you need it.  We recommend signing up for the patient portal called "MyChart".  Sign up information is provided on this After Visit Summary.  MyChart is used to connect with patients for Virtual Visits (Telemedicine).  Patients are able to view lab/test results, encounter notes, upcoming appointments, etc.  Non-urgent messages can be sent to your provider as well.   To learn more about what you can do with MyChart, go to [email or web address].    Your next appointment:   6 month(s)  Provider:   You may see Dina Rich, MD or one of the following Advanced Practice Providers on your designated Care Team:   Randall An, PA-C  Jacolyn Reedy, PA-C     Other Instructions Call us Monday and update our office regarding your blood pressure.

## 2023-07-20 ENCOUNTER — Encounter (HOSPITAL_COMMUNITY): Payer: Medicare HMO

## 2023-07-24 ENCOUNTER — Encounter: Payer: Self-pay | Admitting: Primary Care

## 2023-07-24 ENCOUNTER — Encounter (HOSPITAL_COMMUNITY): Payer: Medicare HMO

## 2023-07-24 ENCOUNTER — Ambulatory Visit (INDEPENDENT_AMBULATORY_CARE_PROVIDER_SITE_OTHER): Payer: Medicare HMO | Admitting: Primary Care

## 2023-07-24 VITALS — BP 172/84 | HR 84 | Ht <= 58 in | Wt 183.3 lb

## 2023-07-24 DIAGNOSIS — G4733 Obstructive sleep apnea (adult) (pediatric): Secondary | ICD-10-CM | POA: Diagnosis not present

## 2023-07-24 NOTE — Progress Notes (Signed)
@Patient  ID: Kristina [name], female    DOB: [birth year], 72 y.o.   MRN: [id number]  Chief Complaint  Patient presents with   Follow-up    F/u cpap, pt also has concerns of a cough and sob.     Referring provider: Benita Stabile, MD  HPI: 72 year old female, never smoked. PMH significant for snoring, HTN, HFpEF, covid pneumonia, diabetes mellitus. Patient of Dr. Craige Cotta.   Previous [facility] pulmonary encounter:  She is slowly getting used to wearing CPAP.  Doesn't like full face mask.  Pressure is okay.  She purchased home oxygen, but isn't using currently.  Sleeping better, and feels more alert.  07/24/2023- Interim hx  Patient presents today for OSA follow-up. She was last seen in July 2023 by Dr. Craige Cotta, ordered for mask fitting. HST on 07/20/21 showed moderate-severe OSA with severe oxygen desaturation; AHI 29.6/hr with SpO2 low 38% with an average of 75%.   She continues to struggle with CPAP compliance. No issues with pressure settings or mask fit. She denies trouble sleeping at night while wearing, she will take her mask off when she wakes up to use the restroom and not put it back on. She is using a full face mask. Current pressure settings 5-20cm h20. She has a Designer, jewellery that she purchased on her down but is no longer using. DME is [employer].   Airview download 06/23/23-07/22/23 Usage days 28/30 days (93%); 2 days (7%) Average usage days used 2 hours 7 minutes Pressure 5 to 20 cm H2O (8.7 cm H2O-95%) Air leaks 42L/min (95%)  AHI 1.0  No Known Allergies  Immunization History  Administered Date(s) Administered   Moderna Sars-Covid-2 Vaccination 04/02/2020, 04/30/2020    Past Medical History:  Diagnosis Date   Acute respiratory distress syndrome (ARDS) due to COVID-19 virus (HCC) 09/2019   Chronic combined systolic and diastolic CHF (congestive heart failure) (HCC)    Diabetes mellitus, type 2 (HCC)    GERD (gastroesophageal reflux disease)    Hyperlipidemia     Hypertension     Tobacco History: Social History   Tobacco Use  Smoking Status Never  Smokeless Tobacco Never   Counseling given: Not Answered   Outpatient Medications Prior to Visit  Medication Sig Dispense Refill   acetaminophen (TYLENOL) 325 MG tablet Take 2 tablets (650 mg total) by mouth every 6 (six) hours as needed for mild pain (or Fever >/= 101). 12 tablet 0   amLODipine (NORVASC) 10 MG tablet Take 1 tablet (10 mg total) by mouth daily. 30 tablet 3   carvedilol (COREG) 6.25 MG tablet Take 1 tablet (6.25 mg total) by mouth 2 (two) times daily. 180 tablet 0   docusate sodium (COLACE) 100 MG capsule Take 1 capsule (100 mg total) by mouth 2 (two) times daily. 10 capsule 0   empagliflozin (JARDIANCE) 10 MG TABS tablet Take 1 tablet (10 mg total) by mouth daily before breakfast. 30 tablet 11   furosemide (LASIX) 40 MG tablet Take 1 tablet (40 mg total) by mouth daily. 30 tablet 2   HYDROcodone-acetaminophen (NORCO/VICODIN) 5-325 MG tablet Take 1-2 tablets by mouth every 6 (six) hours as needed for moderate pain or severe pain. 10 tablet 0   losartan (COZAAR) 100 MG tablet Take 1 tablet (100 mg total) by mouth daily. 30 tablet 5   methocarbamol (ROBAXIN) 500 MG tablet Take 1 tablet (500 mg total) by mouth every 6 (six) hours as needed for muscle spasms. 10 tablet 0   Multiple  Vitamin (MULTIVITAMIN) tablet Take 1 tablet by mouth daily.     omeprazole (PRILOSEC) 40 MG capsule Take 40 mg by mouth daily.     rosuvastatin (CRESTOR) 10 MG tablet Take 10 mg by mouth daily.     No facility-administered medications prior to visit.   Review of Systems  Review of Systems  Constitutional: Negative.   HENT: Negative.    Eyes: Negative.   Respiratory: Negative.     Physical Exam  BP (!) 172/84   Pulse 84   Ht 4\' 10"  (1.473 m)   Wt 183 lb 4.8 oz (83.1 kg)   SpO2 92%   BMI 38.31 kg/m  Physical Exam Constitutional:      Appearance: Normal appearance.  HENT:     Head: Normocephalic  and atraumatic.  Cardiovascular:     Rate and Rhythm: Normal rate and regular rhythm.  Pulmonary:     Effort: Pulmonary effort is normal.     Breath sounds: Normal breath sounds.  Musculoskeletal:        General: Normal range of motion.  Skin:    General: Skin is warm and dry.  Neurological:     General: No focal deficit present.     Mental Status: She is alert and oriented to person, place, and time. Mental status is at baseline.  Psychiatric:        Mood and Affect: Mood normal.        Behavior: Behavior normal.        Thought Content: Thought content normal.        Judgment: Judgment normal.      Lab Results:  CBC    Component Value Date/Time   WBC 9.6 03/20/2023 0141   RBC 3.33 (L) 03/20/2023 0141   HGB 9.6 (L) 03/20/2023 0141   HCT 31.8 (L) 03/20/2023 0141   PLT 204 03/20/2023 0141   MCV 95.5 03/20/2023 0141   MCH 28.8 03/20/2023 0141   MCHC 30.2 03/20/2023 0141   RDW 14.2 03/20/2023 0141   LYMPHSABS 1.5 03/14/2023 1340   MONOABS 0.6 03/14/2023 1340   EOSABS 0.1 03/14/2023 1340   BASOSABS 0.1 03/14/2023 1340    BMET    Component Value Date/Time   NA 139 03/20/2023 0141   K 3.8 03/20/2023 0141   CL 95 (L) 03/20/2023 0141   CO2 37 (H) 03/20/2023 0141   GLUCOSE 122 (H) 03/20/2023 0141   BUN 18 03/20/2023 0141   CREATININE 0.59 03/20/2023 0141   CALCIUM 7.8 (L) 03/20/2023 0141   GFRNONAA >60 03/20/2023 0141   GFRAA >60 10/11/2019 0440    BNP    Component Value Date/Time   BNP 10.0 04/08/2021 0808    ProBNP No results found for: "PROBNP"  Imaging: No results found.   Assessment & Plan:   Sleep apnea -  HST on 07/20/21 showed moderate-severe OSA with severe oxygen desaturation; AHI 29.6/hr with SpO2 low 38% with an average of 75%. Patient continues to struggle to wear CPAP more than 4 hours a night. She does not have difficulties sleeping with CPAP mask but will not put mask back on after waking up in the middle of the night to use the restroom.  Current pressure 5 to 20 cm H2O; residual AHI 1.0/hour. We discussed alternative treatment options for moderate to severe obstructive sleep apnea including hypoglossal nerve stimulator, she will read over patient educational packet  but not likely to pursue.  Recommend adjusting CPAP to set pressure 9cm h20 to see if this  could potentially help with tolerance. Follow-up in 6 months or sooner.  Glenford Bayley, NP 07/24/2023

## 2023-07-24 NOTE — Assessment & Plan Note (Addendum)
-    HST on 07/20/21 showed moderate-severe OSA with severe oxygen desaturation; AHI 29.6/hr with SpO2 low 38% with an average of 75%. Patient continues to struggle to wear CPAP more than 4 hours a night. She does not have difficulties sleeping with CPAP mask but will not put mask back on after waking up in the middle of the night to use the restroom. Current pressure 5 to 20 cm H2O; residual AHI 1.0/hour. We discussed alternative treatment options for moderate to severe obstructive sleep apnea including hypoglossal nerve stimulator, she will read over patient educational packet  but not likely to pursue.  Recommend adjusting CPAP to set pressure 9cm h20 to see if this could potentially help with tolerance. Follow-up in 6 months or sooner.

## 2023-07-24 NOTE — Patient Instructions (Addendum)
Recommendations: You need to wear your CPAP more consistently, aim for 4-6 hours per night We will give you information [employer], look this over and let us know if you want to pursue further work for   Orders: Change cpap pressure 9cm h20   Follow-up: 6 months with Waynetta Sandy NP

## 2023-07-27 ENCOUNTER — Encounter (HOSPITAL_COMMUNITY): Payer: Medicare HMO

## 2023-08-07 ENCOUNTER — Other Ambulatory Visit: Payer: Self-pay

## 2023-08-07 MED ORDER — EMPAGLIFLOZIN 10 MG PO TABS: 10.0000 mg | ORAL_TABLET | Freq: Every day | ORAL | 3 refills | Status: DC

## 2023-08-12 DIAGNOSIS — R2689 Other abnormalities of gait and mobility: Secondary | ICD-10-CM | POA: Diagnosis not present

## 2023-08-12 DIAGNOSIS — S32402D Unspecified fracture of left acetabulum, subsequent encounter for fracture with routine healing: Secondary | ICD-10-CM | POA: Diagnosis not present

## 2023-08-20 ENCOUNTER — Ambulatory Visit: Payer: Self-pay | Admitting: *Deleted

## 2023-08-20 ENCOUNTER — Ambulatory Visit: Payer: Medicare HMO

## 2023-08-20 ENCOUNTER — Encounter (HOSPITAL_COMMUNITY): Admission: RE | Admit: 2023-08-20 | Payer: Medicare HMO | Source: Ambulatory Visit

## 2023-08-20 NOTE — Patient Instructions (Signed)
Visit Information  Thank you for taking time to visit with me today. Please don't hesitate to contact me if I can be of assistance to you.   Following are the goals we discussed today:   Goals Addressed             This Visit's Progress    Manage her congestive heart failure, montior for symptoms of fibroid - care coordination services   On track    Patient will get an appointment with her chiropractor for hip pain Patent will follow up with her pcp for injection in December 2024.  Interventions Today    Flowsheet Row Most Recent Value  Chronic Disease   Chronic disease during today's visit Other  [short of breath with movement, hips stiff]  General Interventions   General Interventions Discussed/Reviewed General Interventions Reviewed, Doctor Visits, Durable Medical Equipment (DME)  [scheduled for an injection today but rescheduled to December 2024 with pcp]  Doctor Visits Discussed/Reviewed Doctor Visits Reviewed, PCP, Specialist  [She wants to start seeing her chiropractor again for her hip pain as she felt it was beneficial. Encouraged to start doing the exercises taught by chiropractor and therapy providers at home]  Durable Medical Equipment (DME) Oxygen, Other  [Use of pusle oximeter & home oxygen Reports her oxygen sats vary when short of breath and even some times at rest it goes down but corrects with deep breathing]  PCP/Specialist Visits Compliance with follow-up visit  Education Interventions   Education Provided --  [confirmed she did receive previous education sent and denies any questions at this time- Monitoring uterine fibroids]  Provided Verbal Education On Exercise, [employer], When to see the doctor  [chiropractor hip exercises encouraged]  Mental Health Interventions   Mental Health Discussed/Reviewed Mental Health Reviewed, Coping Strategies  Pharmacy Interventions   Pharmacy Dicussed/Reviewed Pharmacy Topics Reviewed, Medications and their functions               Our next appointment is by telephone on 10/22/23 at 0930  Please call the care guide team at [phone] if you need to cancel or reschedule your appointment.   If you are experiencing a Mental Health or Behavioral Health Crisis or need someone to talk to, please call the Suicide and Crisis Lifeline: 988 call the [place] National Suicide Prevention Lifeline: (418)544-3144 or TTY: 419-394-0200 TTY 902-512-6896) to talk to a trained counselor call 1-800-273-TALK (toll free, 24 hour hotline) call the [facility]: (701) [phone] call 911   No computer access, no preference for copy of AVS       The patient has been provided with contact information for the care management team and has been advised to call with any health related questions or concerns.   Kirsty Monjaraz L. Noelle Penner, RN, BSN, [facility]  VBCI Care Management Coordinator  [phone]  Fax: [phone]

## 2023-08-20 NOTE — Patient Outreach (Signed)
  Care Coordination   Follow Up Visit Note   08/20/2023 Name: Kristina [name] MRN: [id number] DOB: Dec 25, 1950  Kristina [name] is a 72 y.o. year old female who sees Margo Aye, Kathleene Hazel, MD for primary care. I spoke with  Bobbe Medico Swann by phone today.  What matters to the patients health and wellness today?  Hip pain/chiropractor, education/fibroid, appointments    Goals Addressed             This Visit's Progress    Manage her congestive heart failure, montior for symptoms of fibroid - care coordination services   On track    Patient will get an appointment with her chiropractor for hip pain Patent will follow up with her pcp for injection in December 2024.  Interventions Today    Flowsheet Row Most Recent Value  Chronic Disease   Chronic disease during today's visit Other  [short of breath with movement, hips stiff]  General Interventions   General Interventions Discussed/Reviewed General Interventions Reviewed, Doctor Visits, Durable Medical Equipment (DME)  [scheduled for an injection today but rescheduled to December 2024 with pcp]  Doctor Visits Discussed/Reviewed Doctor Visits Reviewed, PCP, Specialist  [She wants to start seeing her chiropractor again for her hip pain as she felt it was beneficial. Encouraged to start doing the exercises taught by chiropractor and therapy providers at home]  Durable Medical Equipment (DME) Oxygen, Other  [Use of pusle oximeter & home oxygen Reports her oxygen sats vary when short of breath and even some times at rest it goes down but corrects with deep breathing]  PCP/Specialist Visits Compliance with follow-up visit  Education Interventions   Education Provided --  [confirmed she did receive previous education sent and denies any questions at this time- Monitoring uterine fibroids]  Provided Verbal Education On Exercise, [employer], When to see the doctor  [chiropractor hip exercises encouraged]  Mental Health Interventions   Mental  Health Discussed/Reviewed Mental Health Reviewed, Coping Strategies  Pharmacy Interventions   Pharmacy Dicussed/Reviewed Pharmacy Topics Reviewed, Medications and their functions              SDOH assessments and interventions completed:  No     Care Coordination Interventions:  Yes, provided   Follow up plan: Follow up call scheduled for 10/22/23    Encounter Outcome:  Patient Visit Completed   Kristina Bradford L. Noelle Penner, RN, BSN, [facility]  VBCI Care Management Coordinator  [phone]  Fax: [phone]

## 2023-08-21 DIAGNOSIS — G4733 Obstructive sleep apnea (adult) (pediatric): Secondary | ICD-10-CM | POA: Diagnosis not present

## 2023-08-27 DIAGNOSIS — M9902 Segmental and somatic dysfunction of thoracic region: Secondary | ICD-10-CM | POA: Diagnosis not present

## 2023-08-27 DIAGNOSIS — M9903 Segmental and somatic dysfunction of lumbar region: Secondary | ICD-10-CM | POA: Diagnosis not present

## 2023-08-27 DIAGNOSIS — M62830 Muscle spasm of back: Secondary | ICD-10-CM | POA: Diagnosis not present

## 2023-08-27 DIAGNOSIS — M546 Pain in thoracic spine: Secondary | ICD-10-CM | POA: Diagnosis not present

## 2023-08-27 DIAGNOSIS — M9905 Segmental and somatic dysfunction of pelvic region: Secondary | ICD-10-CM | POA: Diagnosis not present

## 2023-08-31 DIAGNOSIS — M9905 Segmental and somatic dysfunction of pelvic region: Secondary | ICD-10-CM | POA: Diagnosis not present

## 2023-08-31 DIAGNOSIS — M546 Pain in thoracic spine: Secondary | ICD-10-CM | POA: Diagnosis not present

## 2023-08-31 DIAGNOSIS — M9903 Segmental and somatic dysfunction of lumbar region: Secondary | ICD-10-CM | POA: Diagnosis not present

## 2023-08-31 DIAGNOSIS — M9902 Segmental and somatic dysfunction of thoracic region: Secondary | ICD-10-CM | POA: Diagnosis not present

## 2023-08-31 DIAGNOSIS — M62830 Muscle spasm of back: Secondary | ICD-10-CM | POA: Diagnosis not present

## 2023-09-05 DIAGNOSIS — R2689 Other abnormalities of gait and mobility: Secondary | ICD-10-CM | POA: Insufficient documentation

## 2023-09-05 DIAGNOSIS — M545 Low back pain, unspecified: Secondary | ICD-10-CM | POA: Insufficient documentation

## 2023-09-05 DIAGNOSIS — M810 Age-related osteoporosis without current pathological fracture: Secondary | ICD-10-CM | POA: Diagnosis not present

## 2023-09-05 DIAGNOSIS — E870 Hyperosmolality and hypernatremia: Secondary | ICD-10-CM | POA: Insufficient documentation

## 2023-09-05 DIAGNOSIS — I509 Heart failure, unspecified: Secondary | ICD-10-CM | POA: Diagnosis not present

## 2023-09-05 DIAGNOSIS — R2681 Unsteadiness on feet: Secondary | ICD-10-CM | POA: Insufficient documentation

## 2023-09-05 DIAGNOSIS — S32401D Unspecified fracture of right acetabulum, subsequent encounter for fracture with routine healing: Secondary | ICD-10-CM | POA: Diagnosis not present

## 2023-09-10 DIAGNOSIS — M545 Low back pain, unspecified: Secondary | ICD-10-CM | POA: Diagnosis not present

## 2023-09-10 DIAGNOSIS — S32401D Unspecified fracture of right acetabulum, subsequent encounter for fracture with routine healing: Secondary | ICD-10-CM | POA: Diagnosis not present

## 2023-09-10 DIAGNOSIS — I509 Heart failure, unspecified: Secondary | ICD-10-CM | POA: Diagnosis not present

## 2023-09-10 DIAGNOSIS — Z79899 Other long term (current) drug therapy: Secondary | ICD-10-CM | POA: Diagnosis not present

## 2023-09-10 DIAGNOSIS — X58XXXD Exposure to other specified factors, subsequent encounter: Secondary | ICD-10-CM | POA: Diagnosis not present

## 2023-09-11 DIAGNOSIS — S32402D Unspecified fracture of left acetabulum, subsequent encounter for fracture with routine healing: Secondary | ICD-10-CM | POA: Diagnosis not present

## 2023-09-11 DIAGNOSIS — R2689 Other abnormalities of gait and mobility: Secondary | ICD-10-CM | POA: Diagnosis not present

## 2023-09-12 ENCOUNTER — Other Ambulatory Visit: Payer: Self-pay | Admitting: Cardiology

## 2023-09-12 DIAGNOSIS — M545 Low back pain, unspecified: Secondary | ICD-10-CM | POA: Diagnosis not present

## 2023-09-12 DIAGNOSIS — M25559 Pain in unspecified hip: Secondary | ICD-10-CM | POA: Diagnosis not present

## 2023-09-14 DIAGNOSIS — Z6838 Body mass index (BMI) 38.0-38.9, adult: Secondary | ICD-10-CM | POA: Diagnosis not present

## 2023-09-14 DIAGNOSIS — M5416 Radiculopathy, lumbar region: Secondary | ICD-10-CM | POA: Diagnosis not present

## 2023-09-14 DIAGNOSIS — S32020D Wedge compression fracture of second lumbar vertebra, subsequent encounter for fracture with routine healing: Secondary | ICD-10-CM | POA: Diagnosis not present

## 2023-09-20 DIAGNOSIS — G4733 Obstructive sleep apnea (adult) (pediatric): Secondary | ICD-10-CM | POA: Diagnosis not present

## 2023-09-27 ENCOUNTER — Other Ambulatory Visit (HOSPITAL_COMMUNITY): Payer: Self-pay | Admitting: Neurosurgery

## 2023-09-27 DIAGNOSIS — S32020D Wedge compression fracture of second lumbar vertebra, subsequent encounter for fracture with routine healing: Secondary | ICD-10-CM

## 2023-09-28 ENCOUNTER — Ambulatory Visit (HOSPITAL_COMMUNITY)
Admission: RE | Admit: 2023-09-28 | Discharge: 2023-09-28 | Disposition: A | Discharge status: Home / Self Care | Payer: Medicare HMO | Source: Ambulatory Visit | Attending: Neurosurgery | Admitting: Neurosurgery

## 2023-09-28 ENCOUNTER — Ambulatory Visit: Payer: Medicare HMO | Admitting: Orthopedic Surgery

## 2023-09-28 DIAGNOSIS — M51369 Other intervertebral disc degeneration, lumbar region without mention of lumbar back pain or lower extremity pain: Secondary | ICD-10-CM | POA: Diagnosis not present

## 2023-09-28 DIAGNOSIS — M4805 Spinal stenosis, thoracolumbar region: Secondary | ICD-10-CM | POA: Diagnosis not present

## 2023-09-28 DIAGNOSIS — S32020D Wedge compression fracture of second lumbar vertebra, subsequent encounter for fracture with routine healing: Secondary | ICD-10-CM | POA: Insufficient documentation

## 2023-09-28 DIAGNOSIS — S32000D Wedge compression fracture of unspecified lumbar vertebra, subsequent encounter for fracture with routine healing: Secondary | ICD-10-CM | POA: Diagnosis not present

## 2023-09-28 DIAGNOSIS — R29890 Loss of height: Secondary | ICD-10-CM | POA: Diagnosis not present

## 2023-10-02 DIAGNOSIS — M545 Low back pain, unspecified: Secondary | ICD-10-CM | POA: Diagnosis not present

## 2023-10-02 DIAGNOSIS — S32402D Unspecified fracture of left acetabulum, subsequent encounter for fracture with routine healing: Secondary | ICD-10-CM | POA: Diagnosis not present

## 2023-10-12 DIAGNOSIS — R2689 Other abnormalities of gait and mobility: Secondary | ICD-10-CM | POA: Diagnosis not present

## 2023-10-12 DIAGNOSIS — S32402D Unspecified fracture of left acetabulum, subsequent encounter for fracture with routine healing: Secondary | ICD-10-CM | POA: Diagnosis not present

## 2023-10-19 DIAGNOSIS — M8088XA Other osteoporosis with current pathological fracture, vertebra(e), initial encounter for fracture: Secondary | ICD-10-CM | POA: Diagnosis not present

## 2023-10-19 DIAGNOSIS — S32010A Wedge compression fracture of first lumbar vertebra, initial encounter for closed fracture: Secondary | ICD-10-CM | POA: Diagnosis not present

## 2023-10-19 DIAGNOSIS — S22080A Wedge compression fracture of T11-T12 vertebra, initial encounter for closed fracture: Secondary | ICD-10-CM | POA: Diagnosis not present

## 2023-10-19 DIAGNOSIS — Z6838 Body mass index (BMI) 38.0-38.9, adult: Secondary | ICD-10-CM | POA: Diagnosis not present

## 2023-10-21 DIAGNOSIS — G4733 Obstructive sleep apnea (adult) (pediatric): Secondary | ICD-10-CM | POA: Diagnosis not present

## 2023-10-22 ENCOUNTER — Ambulatory Visit: Payer: Self-pay | Admitting: *Deleted

## 2023-10-22 DIAGNOSIS — M810 Age-related osteoporosis without current pathological fracture: Secondary | ICD-10-CM | POA: Diagnosis not present

## 2023-10-22 DIAGNOSIS — M545 Low back pain, unspecified: Secondary | ICD-10-CM | POA: Diagnosis not present

## 2023-10-22 NOTE — Patient Instructions (Addendum)
Visit Information  Thank you for taking time to visit with me today. Please don't hesitate to contact me if I can be of assistance to you.   Following are the goals we discussed today:   Goals Addressed             This Visit's Progress    Manage her back pain/fracture, congestive heart failure, montior for symptoms of fibroid - care managerservices   Not on track    Patient will get an appointment with her chiropractor for hip pain Patent will follow up with her pcp for injection in December 2024. 10/22/23 patient failed to follow up. Has an appointment today with pcp staff Interventions Today    Flowsheet Row Most Recent Value  Chronic Disease   Chronic disease during today's visit Other  General Interventions   General Interventions Discussed/Reviewed General Interventions Reviewed, Durable Medical Equipment (DME), Doctor Visits  Doctor Visits Discussed/Reviewed Doctor Visits Reviewed, PCP, Specialist  Durable Medical Equipment (DME) Oxygen, Other, Wheelchair  [encouraged use of back braces, reacher to prevent further back pain, injury]  Wheelchair Standard  PCP/Specialist Visits Compliance with follow-up visit  Communication with PCP/Specialists  [sent RN CM 10/22/23 note to pcp/specialist]  Exercise Interventions   Exercise Discussed/Reviewed Exercise Reviewed, Physical Activity, Assistive device use and maintanence  Physical Activity Discussed/Reviewed Physical Activity Reviewed  [no HH PT confirmed at this time - Encouraged patient to ask her MDs about services]  Education Interventions   Education Provided Provided Education  [back pain prevention, safety, back braces, reachers, Prolia]  Provided Verbal Education On Exercise, Medication, Community Resources  Mental Health Interventions   Mental Health Discussed/Reviewed Mental Health Reviewed, Coping Strategies  Pharmacy Interventions   Pharmacy Dicussed/Reviewed Pharmacy Topics Reviewed, Medications and their functions,  Affording Medications  Safety Interventions   Safety Discussed/Reviewed Safety Reviewed, Home Safety  Home Safety Assistive Devices              Our next appointment is by telephone on 11/22/23 at 1115  Please call the care guide team at [phone] if you need to cancel or reschedule your appointment.   If you are experiencing a Mental Health or Behavioral Health Crisis or need someone to talk to, please call the Suicide and Crisis Lifeline: 988 call the [place] National Suicide Prevention Lifeline: 601-668-2144 or TTY: 272-691-9621 TTY 770-847-9897) to talk to a trained counselor call 1-800-273-TALK (toll free, 24 hour hotline) call the [facility]: 573 690 5646 call 911   No computer access, no preference for copy of AVS    The patient has been provided with contact information for the care management team and has been advised to call with any health related questions or concerns.   Shenea Giacobbe L. Noelle Penner, RN, BSN, CCM [place]  [facility], [facility] RN Care Manager Direct Dial: [phone]  Fax: [phone] Mailing Address: [address]  [place] [address] Website: [email or web address]

## 2023-10-22 NOTE — Patient Outreach (Signed)
Care Coordination   Follow Up Visit Note   10/22/2023 Name: Kristina [name] MRN: [id number] DOB: 1950/12/26  Kristina [name] is a 73 y.o. year old female who sees Kristina [name], Kristina Hazel, MD for primary care. I spoke with  Kristina [name] by phone today.  What matters to the patients health and wellness today?  Back fracture pain with pending injections for pain and November 2024 missed Prolia  Back fracture Back pain aggravated with movement- She reached to pick up food her husband dropped in the grocery store this weekend and since then she's been having pain with movement at a pain level of 7-8. She has been resting in her recliner as she has not been about to get in her bed & lie flat  Has a reacher and a back brace but not using either  Pending an injection to decrease the pain, discussion with her pcp about next Prolia &  a discussion with specialist to discuss further options She confirmed for cost reasons she was able to make arrangements for a pcp nurse to assist with her Prolia injection after it was mailed to her. She was and will not be able to afford the almost $250 cost otherwise She confirms she will ask her providers about possible need for a specific brace to prevent further injury and home health PT to help with instructions on movement limitations    Goals Addressed             This Visit's Progress    Manage her back pain/fracture, congestive heart failure, montior for symptoms of fibroid - care managerservices   Not on track    Patient will get an appointment with her chiropractor for hip pain Patent will follow up with her pcp for injection in December 2024. 10/22/23 patient failed to follow up. Has an appointment today with pcp staff Interventions Today    Flowsheet Row Most Recent Value  Chronic Disease   Chronic disease during today's visit Other  General Interventions   General Interventions Discussed/Reviewed General Interventions Reviewed, Durable Medical Equipment  (DME), Doctor Visits  Doctor Visits Discussed/Reviewed Doctor Visits Reviewed, PCP, Specialist  Durable Medical Equipment (DME) Oxygen, Other, Wheelchair  [encouraged use of back braces, reacher to prevent further back pain, injury]  Wheelchair Standard  PCP/Specialist Visits Compliance with follow-up visit  Communication with PCP/Specialists  [sent RN CM 10/22/23 note to pcp/specialist]  Exercise Interventions   Exercise Discussed/Reviewed Exercise Reviewed, Physical Activity, Assistive device use and maintanence  Physical Activity Discussed/Reviewed Physical Activity Reviewed  [no HH PT confirmed at this time - Encouraged patient to ask her MDs about services]  Education Interventions   Education Provided Provided Education  [back pain prevention, safety, back braces, reachers, Prolia]  Provided Verbal Education On Exercise, Medication, Community Resources  Mental Health Interventions   Mental Health Discussed/Reviewed Mental Health Reviewed, Coping Strategies  Pharmacy Interventions   Pharmacy Dicussed/Reviewed Pharmacy Topics Reviewed, Medications and their functions, Affording Medications  Safety Interventions   Safety Discussed/Reviewed Safety Reviewed, Home Safety  Home Safety Assistive Devices              SDOH assessments and interventions completed:  No     Care Coordination Interventions:  Yes, provided   Follow up plan: Follow up call scheduled for 11/22/23    Encounter Outcome:  Patient Visit Completed   Kristina Bradford L. Noelle Penner, RN, BSN, CCM [place]  [facility], [facility] RN Care Manager Direct Dial: [phone]  Fax:  [phone] Mailing Address: [address]  [place] [address] Website: [email or web address]

## 2023-10-23 ENCOUNTER — Inpatient Hospital Stay (HOSPITAL_COMMUNITY)
Admission: EM | Admit: 2023-10-23 | Discharge: 2023-10-25 | DRG: 291 | Disposition: A | Discharge status: Home / Self Care | Payer: Medicare HMO | Attending: Internal Medicine | Admitting: Internal Medicine

## 2023-10-23 ENCOUNTER — Other Ambulatory Visit: Payer: Self-pay

## 2023-10-23 ENCOUNTER — Emergency Department (HOSPITAL_COMMUNITY): Payer: Medicare HMO

## 2023-10-23 ENCOUNTER — Encounter (HOSPITAL_COMMUNITY): Payer: Self-pay | Admitting: Emergency Medicine

## 2023-10-23 DIAGNOSIS — Z8249 Family history of ischemic heart disease and other diseases of the circulatory system: Secondary | ICD-10-CM | POA: Diagnosis not present

## 2023-10-23 DIAGNOSIS — I5043 Acute on chronic combined systolic (congestive) and diastolic (congestive) heart failure: Secondary | ICD-10-CM | POA: Diagnosis not present

## 2023-10-23 DIAGNOSIS — E119 Type 2 diabetes mellitus without complications: Secondary | ICD-10-CM | POA: Diagnosis present

## 2023-10-23 DIAGNOSIS — G4733 Obstructive sleep apnea (adult) (pediatric): Secondary | ICD-10-CM | POA: Diagnosis present

## 2023-10-23 DIAGNOSIS — E785 Hyperlipidemia, unspecified: Secondary | ICD-10-CM | POA: Diagnosis not present

## 2023-10-23 DIAGNOSIS — Z8616 Personal history of COVID-19: Secondary | ICD-10-CM | POA: Diagnosis not present

## 2023-10-23 DIAGNOSIS — I5032 Chronic diastolic (congestive) heart failure: Secondary | ICD-10-CM

## 2023-10-23 DIAGNOSIS — Z91199 Patient's noncompliance with other medical treatment and regimen due to unspecified reason: Secondary | ICD-10-CM

## 2023-10-23 DIAGNOSIS — R0989 Other specified symptoms and signs involving the circulatory and respiratory systems: Secondary | ICD-10-CM | POA: Diagnosis not present

## 2023-10-23 DIAGNOSIS — M810 Age-related osteoporosis without current pathological fracture: Secondary | ICD-10-CM | POA: Diagnosis present

## 2023-10-23 DIAGNOSIS — I5033 Acute on chronic diastolic (congestive) heart failure: Secondary | ICD-10-CM | POA: Diagnosis not present

## 2023-10-23 DIAGNOSIS — R000 Tachycardia, unspecified: Secondary | ICD-10-CM

## 2023-10-23 DIAGNOSIS — Z6838 Body mass index (BMI) 38.0-38.9, adult: Secondary | ICD-10-CM

## 2023-10-23 DIAGNOSIS — K589 Irritable bowel syndrome without diarrhea: Secondary | ICD-10-CM | POA: Diagnosis present

## 2023-10-23 DIAGNOSIS — R0600 Dyspnea, unspecified: Secondary | ICD-10-CM | POA: Diagnosis present

## 2023-10-23 DIAGNOSIS — I110 Hypertensive heart disease with heart failure: Principal | ICD-10-CM | POA: Diagnosis present

## 2023-10-23 DIAGNOSIS — E669 Obesity, unspecified: Secondary | ICD-10-CM | POA: Diagnosis not present

## 2023-10-23 DIAGNOSIS — J9601 Acute respiratory failure with hypoxia: Secondary | ICD-10-CM | POA: Diagnosis not present

## 2023-10-23 DIAGNOSIS — R0902 Hypoxemia: Secondary | ICD-10-CM | POA: Diagnosis not present

## 2023-10-23 DIAGNOSIS — Z7984 Long term (current) use of oral hypoglycemic drugs: Secondary | ICD-10-CM | POA: Diagnosis not present

## 2023-10-23 DIAGNOSIS — Z79899 Other long term (current) drug therapy: Secondary | ICD-10-CM

## 2023-10-23 DIAGNOSIS — E662 Morbid (severe) obesity with alveolar hypoventilation: Secondary | ICD-10-CM | POA: Diagnosis present

## 2023-10-23 DIAGNOSIS — E66812 Obesity, class 2: Secondary | ICD-10-CM | POA: Diagnosis not present

## 2023-10-23 DIAGNOSIS — R0602 Shortness of breath: Secondary | ICD-10-CM | POA: Diagnosis present

## 2023-10-23 DIAGNOSIS — K219 Gastro-esophageal reflux disease without esophagitis: Secondary | ICD-10-CM | POA: Diagnosis present

## 2023-10-23 DIAGNOSIS — G8929 Other chronic pain: Secondary | ICD-10-CM | POA: Diagnosis not present

## 2023-10-23 DIAGNOSIS — I509 Heart failure, unspecified: Secondary | ICD-10-CM

## 2023-10-23 DIAGNOSIS — G4730 Sleep apnea, unspecified: Secondary | ICD-10-CM | POA: Diagnosis present

## 2023-10-23 DIAGNOSIS — J9621 Acute and chronic respiratory failure with hypoxia: Secondary | ICD-10-CM | POA: Diagnosis not present

## 2023-10-23 DIAGNOSIS — R918 Other nonspecific abnormal finding of lung field: Secondary | ICD-10-CM | POA: Diagnosis not present

## 2023-10-23 DIAGNOSIS — R0609 Other forms of dyspnea: Secondary | ICD-10-CM | POA: Diagnosis not present

## 2023-10-23 LAB — CBC
HCT: 46.6 % — ABNORMAL HIGH (ref 36.0–46.0)
Hemoglobin: 14.1 g/dL (ref 12.0–15.0)
MCH: 29.3 pg (ref 26.0–34.0)
MCHC: 30.3 g/dL (ref 30.0–36.0)
MCV: 96.9 fL (ref 80.0–100.0)
Platelets: 203 10*3/uL (ref 150–400)
RBC: 4.81 MIL/uL (ref 3.87–5.11)
RDW: 14 % (ref 11.5–15.5)
WBC: 9.6 10*3/uL (ref 4.0–10.5)
nRBC: 0 % (ref 0.0–0.2)

## 2023-10-23 LAB — RESP PANEL BY RT-PCR (RSV, FLU A&B, COVID)  RVPGX2
Influenza A by PCR: NEGATIVE
Influenza B by PCR: NEGATIVE
Resp Syncytial Virus by PCR: NEGATIVE
SARS Coronavirus 2 by RT PCR: NEGATIVE

## 2023-10-23 LAB — LIPID PANEL
Cholesterol: 148 mg/dL (ref 0–200)
HDL: 57 mg/dL (ref >40)
LDL Cholesterol: 57 mg/dL (ref 0–99)
Total CHOL/HDL Ratio: 2.6 {ratio}
Triglycerides: 169 mg/dL — ABNORMAL HIGH (ref <150)
VLDL: 34 mg/dL (ref 0–40)

## 2023-10-23 LAB — COMPREHENSIVE METABOLIC PANEL
ALT: 15 U/L (ref 0–44)
AST: 15 U/L (ref 15–41)
Albumin: 3.9 g/dL (ref 3.5–5.0)
Alkaline Phosphatase: 82 U/L (ref 38–126)
Anion gap: 10 (ref 5–15)
BUN: 15 mg/dL (ref 8–23)
CO2: 31 mmol/L (ref 22–32)
Calcium: 9 mg/dL (ref 8.9–10.3)
Chloride: 99 mmol/L (ref 98–111)
Creatinine, Ser: 0.45 mg/dL (ref 0.44–1.00)
GFR, Estimated: >60 mL/min (ref >60)
Glucose, Bld: 99 mg/dL (ref 70–99)
Potassium: 4.2 mmol/L (ref 3.5–5.1)
Sodium: 140 mmol/L (ref 135–145)
Total Bilirubin: 0.5 mg/dL (ref 0.0–1.2)
Total Protein: 7.3 g/dL (ref 6.5–8.1)

## 2023-10-23 LAB — TROPONIN I (HIGH SENSITIVITY): Troponin I (High Sensitivity): 3 ng/L (ref <18)

## 2023-10-23 LAB — D-DIMER, QUANTITATIVE: D-Dimer, Quant: 0.5 ug{FEU}/mL (ref 0.00–0.50)

## 2023-10-23 LAB — HEMOGLOBIN A1C
Hgb A1c MFr Bld: 6.2 % — ABNORMAL HIGH (ref 4.8–5.6)
Mean Plasma Glucose: 131.24 mg/dL

## 2023-10-23 LAB — VITAMIN D 25 HYDROXY (VIT D DEFICIENCY, FRACTURES): Vit D, 25-Hydroxy: 37.16 ng/mL (ref 30–100)

## 2023-10-23 LAB — BRAIN NATRIURETIC PEPTIDE: B Natriuretic Peptide: 70 pg/mL (ref 0.0–100.0)

## 2023-10-23 LAB — VITAMIN B12: Vitamin B-12: 286 pg/mL (ref 180–914)

## 2023-10-23 LAB — TSH: TSH: 0.297 u[IU]/mL — ABNORMAL LOW (ref 0.350–4.500)

## 2023-10-23 LAB — GLUCOSE, CAPILLARY: Glucose-Capillary: 109 mg/dL — ABNORMAL HIGH (ref 70–99)

## 2023-10-23 LAB — MAGNESIUM: Magnesium: 1.9 mg/dL (ref 1.7–2.4)

## 2023-10-23 LAB — PHOSPHORUS: Phosphorus: 3.6 mg/dL (ref 2.5–4.6)

## 2023-10-23 MED ORDER — FUROSEMIDE 10 MG/ML IJ SOLN
40.0000 mg | Freq: Once | INTRAMUSCULAR | Status: AC
  Administered 2023-10-23: 40 mg via INTRAVENOUS
  Filled 2023-10-23: qty 4

## 2023-10-23 MED ORDER — DOCUSATE SODIUM 100 MG PO CAPS
100.0000 mg | ORAL_CAPSULE | Freq: Two times a day (BID) | ORAL | Status: DC
  Administered 2023-10-24 – 2023-10-25 (×3): 100 mg via ORAL
  Filled 2023-10-23 (×3): qty 1

## 2023-10-23 MED ORDER — ROSUVASTATIN CALCIUM 10 MG PO TABS
10.0000 mg | ORAL_TABLET | Freq: Every day | ORAL | Status: DC
  Administered 2023-10-23 – 2023-10-25 (×3): 10 mg via ORAL
  Filled 2023-10-23 (×3): qty 1

## 2023-10-23 MED ORDER — ENOXAPARIN SODIUM 40 MG/0.4ML IJ SOSY
40.0000 mg | PREFILLED_SYRINGE | INTRAMUSCULAR | Status: DC
  Administered 2023-10-24: 40 mg via SUBCUTANEOUS
  Filled 2023-10-23: qty 0.4

## 2023-10-23 MED ORDER — ICOSAPENT ETHYL 1 G PO CAPS
2.0000 g | ORAL_CAPSULE | Freq: Two times a day (BID) | ORAL | Status: DC
  Administered 2023-10-24 – 2023-10-25 (×3): 2 g via ORAL
  Filled 2023-10-23 (×6): qty 2

## 2023-10-23 MED ORDER — AMLODIPINE BESYLATE 5 MG PO TABS
10.0000 mg | ORAL_TABLET | Freq: Every day | ORAL | Status: DC
  Administered 2023-10-23 – 2023-10-25 (×3): 10 mg via ORAL
  Filled 2023-10-23 (×3): qty 2

## 2023-10-23 MED ORDER — PANTOPRAZOLE SODIUM 40 MG PO TBEC
40.0000 mg | DELAYED_RELEASE_TABLET | Freq: Every day | ORAL | Status: DC
  Administered 2023-10-23 – 2023-10-25 (×3): 40 mg via ORAL
  Filled 2023-10-23 (×3): qty 1

## 2023-10-23 MED ORDER — METHOCARBAMOL 500 MG PO TABS: 500.0000 mg | ORAL_TABLET | Freq: Four times a day (QID) | ORAL | Status: DC | PRN

## 2023-10-23 MED ORDER — LOSARTAN POTASSIUM 50 MG PO TABS
100.0000 mg | ORAL_TABLET | Freq: Every day | ORAL | Status: DC
  Administered 2023-10-23 – 2023-10-25 (×3): 100 mg via ORAL
  Filled 2023-10-23 (×3): qty 2

## 2023-10-23 MED ORDER — ONDANSETRON HCL 4 MG/2ML IJ SOLN: 4.0000 mg | Freq: Four times a day (QID) | INTRAMUSCULAR | Status: DC | PRN

## 2023-10-23 MED ORDER — ACETAMINOPHEN 500 MG PO TABS
1000.0000 mg | ORAL_TABLET | Freq: Once | ORAL | Status: AC
  Administered 2023-10-23: 1000 mg via ORAL
  Filled 2023-10-23: qty 2

## 2023-10-23 MED ORDER — EMPAGLIFLOZIN 10 MG PO TABS
10.0000 mg | ORAL_TABLET | Freq: Every day | ORAL | Status: DC
  Administered 2023-10-24 – 2023-10-25 (×2): 10 mg via ORAL
  Filled 2023-10-23 (×3): qty 1

## 2023-10-23 MED ORDER — INSULIN ASPART 100 UNIT/ML IJ SOLN
0.0000 [IU] | Freq: Three times a day (TID) | INTRAMUSCULAR | Status: DC
  Administered 2023-10-24: 2 [IU] via SUBCUTANEOUS
  Administered 2023-10-24: 5 [IU] via SUBCUTANEOUS
  Administered 2023-10-25: 2 [IU] via SUBCUTANEOUS

## 2023-10-23 MED ORDER — FUROSEMIDE 10 MG/ML IJ SOLN
40.0000 mg | Freq: Every day | INTRAMUSCULAR | Status: DC
  Administered 2023-10-24: 40 mg via INTRAVENOUS
  Filled 2023-10-23: qty 4

## 2023-10-23 MED ORDER — IPRATROPIUM BROMIDE 0.02 % IN SOLN
0.5000 mg | Freq: Once | RESPIRATORY_TRACT | Status: AC
  Administered 2023-10-23: 0.5 mg via RESPIRATORY_TRACT
  Filled 2023-10-23: qty 2.5

## 2023-10-23 MED ORDER — IPRATROPIUM-ALBUTEROL 0.5-2.5 (3) MG/3ML IN SOLN: 3.0000 mL | Freq: Four times a day (QID) | RESPIRATORY_TRACT | Status: DC | PRN

## 2023-10-23 MED ORDER — ADULT MULTIVITAMIN W/MINERALS CH
1.0000 | ORAL_TABLET | Freq: Every day | ORAL | Status: DC
  Administered 2023-10-24 – 2023-10-25 (×2): 1 via ORAL
  Filled 2023-10-23 (×6): qty 1

## 2023-10-23 MED ORDER — ACETAMINOPHEN 650 MG RE SUPP
650.0000 mg | Freq: Four times a day (QID) | RECTAL | Status: DC | PRN
Start: 2023-10-23 — End: 2023-10-25

## 2023-10-23 MED ORDER — SENNOSIDES-DOCUSATE SODIUM 8.6-50 MG PO TABS: 1.0000 | ORAL_TABLET | Freq: Every evening | ORAL | Status: DC | PRN

## 2023-10-23 MED ORDER — LINACLOTIDE 145 MCG PO CAPS
145.0000 ug | ORAL_CAPSULE | Freq: Every day | ORAL | Status: DC
  Administered 2023-10-23 – 2023-10-25 (×3): 145 ug via ORAL
  Filled 2023-10-23 (×3): qty 1

## 2023-10-23 MED ORDER — ONDANSETRON HCL 4 MG PO TABS: 4.0000 mg | ORAL_TABLET | Freq: Four times a day (QID) | ORAL | Status: DC | PRN

## 2023-10-23 MED ORDER — ALBUTEROL SULFATE (2.5 MG/3ML) 0.083% IN NEBU
5.0000 mg | INHALATION_SOLUTION | Freq: Once | RESPIRATORY_TRACT | Status: AC
  Administered 2023-10-23: 5 mg via RESPIRATORY_TRACT
  Filled 2023-10-23: qty 6

## 2023-10-23 MED ORDER — ACETAMINOPHEN 325 MG PO TABS: 650.0000 mg | ORAL_TABLET | Freq: Four times a day (QID) | ORAL | Status: DC | PRN

## 2023-10-23 MED ORDER — HYDROCODONE-ACETAMINOPHEN 5-325 MG PO TABS
1.0000 | ORAL_TABLET | Freq: Four times a day (QID) | ORAL | Status: DC | PRN
Start: 2023-10-23 — End: 2023-10-25

## 2023-10-23 MED ORDER — CARVEDILOL 3.125 MG PO TABS
6.2500 mg | ORAL_TABLET | Freq: Two times a day (BID) | ORAL | Status: DC
  Administered 2023-10-23 – 2023-10-25 (×4): 6.25 mg via ORAL
  Filled 2023-10-23 (×4): qty 2

## 2023-10-23 NOTE — ED Provider Notes (Signed)
[place] EMERGENCY DEPARTMENT AT [facility] Provider Note   CSN: [id number] Arrival date & time: 10/23/23  1104     History  Chief Complaint  Patient presents with   Shortness of Breath    Kristina [name] is a 73 y.o. female.  Pt with hx chf, sleep apnea, c/o sob and o2 sats low, in 70s. Has home, but does not use regularly. Denies new or worsening cough. No sore throat or  other uri symptoms. Denies chest pain. No fever or chills. Compliant w home meds. No new leg swelling or pain. ?orthopnea.   The history is provided by the patient, a relative, medical records and the spouse.  Shortness of Breath Associated symptoms: no abdominal pain, no chest pain, no cough, no fever, no headaches, no neck pain, no rash, no sore throat and no vomiting        Home Medications Prior to Admission medications   Medication Sig Start Date End Date Taking? Authorizing Provider  acetaminophen (TYLENOL) 325 MG tablet Take 2 tablets (650 mg total) by mouth every 6 (six) hours as needed for mild pain (or Fever >/= 101). 04/09/21   Emokpae, Courage, MD  amLODipine (NORVASC) 10 MG tablet Take 1 tablet (10 mg total) by mouth daily. 04/09/21   Shon Hale, MD  carvedilol (COREG) 6.25 MG tablet TAKE 1 TABLET TWICE A DAY 09/12/23   Antoine Poche, MD  denosumab (PROLIA) 60 MG/ML SOSY injection INJECT 60 MG INTO THE SKIN EVERY 6 MONTHS. 07/25/23   [provider]  docusate sodium (COLACE) 100 MG capsule Take 1 capsule (100 mg total) by mouth 2 (two) times daily. 03/19/23   Sherryll Burger, Pratik D, DO  empagliflozin (JARDIANCE) 10 MG TABS tablet Take 1 tablet (10 mg total) by mouth daily before breakfast. 08/07/23   Branch, Dorothe Pea, MD  furosemide (LASIX) 40 MG tablet Take 1 tablet (40 mg total) by mouth daily. 04/10/21   Shon Hale, MD  HYDROcodone-acetaminophen (NORCO/VICODIN) 5-325 MG tablet Take 1-2 tablets by mouth every 6 (six) hours as needed for moderate pain or severe pain.  03/19/23   Sherryll Burger, Pratik D, DO  LINZESS 145 MCG CAPS capsule Take 145 mcg by mouth daily. 06/06/23   [provider]  losartan (COZAAR) 100 MG tablet Take 1 tablet (100 mg total) by mouth daily. 04/10/21   Shon Hale, MD  methocarbamol (ROBAXIN) 500 MG tablet Take 1 tablet (500 mg total) by mouth every 6 (six) hours as needed for muscle spasms. 03/19/23   Sherryll Burger, Pratik D, DO  Multiple Vitamin (MULTIVITAMIN) tablet Take 1 tablet by mouth daily.    [provider]  omeprazole (PRILOSEC) 40 MG capsule Take 40 mg by mouth daily. 07/24/19   [provider]  rosuvastatin (CRESTOR) 10 MG tablet Take 10 mg by mouth daily.    [provider]  VASCEPA 1 g capsule Take 2 g by mouth 2 (two) times daily. 06/21/23   [provider]      Allergies    Patient has no known allergies.    Review of Systems   Review of Systems  Constitutional:  Negative for chills and fever.  HENT:  Negative for sore throat.   Eyes:  Negative for redness.  Respiratory:  Positive for shortness of breath. Negative for cough.   Cardiovascular:  Negative for chest pain.  Gastrointestinal:  Negative for abdominal pain and vomiting.  Genitourinary:  Negative for dysuria and flank pain.  Musculoskeletal:  Negative for  back pain and neck pain.  Skin:  Negative for rash.  Neurological:  Negative for headaches.    Physical Exam Updated Vital Signs BP (!) 146/102   Pulse (!) 105   Temp 98.5 F (36.9 C) (Oral)   Resp (!) 21   Ht 1.473 m (4\' 10" )   Wt 83 kg   SpO2 94%   BMI 38.24 kg/m  Physical Exam Vitals and nursing note reviewed.  Constitutional:      Appearance: Normal appearance. She is well-developed.  HENT:     Head: Atraumatic.     Nose: Nose normal.     Mouth/Throat:     Mouth: Mucous membranes are moist.     Pharynx: Oropharynx is clear.  Eyes:     General: No scleral icterus.    Conjunctiva/sclera: Conjunctivae normal.     Pupils: Pupils are equal, round, and  reactive to light.  Neck:     Trachea: No tracheal deviation.  Cardiovascular:     Rate and Rhythm: Regular rhythm. Tachycardia present.     Pulses: Normal pulses.     Heart sounds: Normal heart sounds. No murmur heard.    No friction rub. No gallop.  Pulmonary:     Effort: Pulmonary effort is normal. No respiratory distress.     Comments: Mild wheezing. Few basilar rales.  Abdominal:     General: Bowel sounds are normal. There is no distension.     Palpations: Abdomen is soft.     Tenderness: There is no abdominal tenderness.  Genitourinary:    Comments: No cva tenderness.  Musculoskeletal:        General: No swelling or tenderness.     Cervical back: Normal range of motion and neck supple. No rigidity. No muscular tenderness.  Skin:    General: Skin is warm and dry.     Findings: No rash.  Neurological:     Mental Status: She is alert.     Comments: Alert, speech normal.   Psychiatric:        Mood and Affect: Mood normal.     ED Results / Procedures / Treatments   Labs (all labs ordered are listed, but only abnormal results are displayed) Results for orders placed or performed during the hospital encounter of 10/23/23  Resp panel by RT-PCR (RSV, Flu A&B, Covid) Anterior Nasal Swab   Collection Time: 10/23/23 12:19 PM   Specimen: Anterior Nasal Swab  Result Value Ref Range   SARS Coronavirus 2 by RT PCR NEGATIVE NEGATIVE   Influenza A by PCR NEGATIVE NEGATIVE   Influenza B by PCR NEGATIVE NEGATIVE   Resp Syncytial Virus by PCR NEGATIVE NEGATIVE  Brain natriuretic peptide   Collection Time: 10/23/23 12:19 PM  Result Value Ref Range   B Natriuretic Peptide 70.0 0.0 - 100.0 pg/mL  CBC   Collection Time: 10/23/23 12:28 PM  Result Value Ref Range   WBC 9.6 4.0 - 10.5 K/uL   RBC 4.81 3.87 - 5.11 MIL/uL   Hemoglobin 14.1 12.0 - 15.0 g/dL   HCT 16.1 (H) 09.6 - 04.5 %   MCV 96.9 80.0 - 100.0 fL   MCH 29.3 26.0 - 34.0 pg   MCHC 30.3 30.0 - 36.0 g/dL   RDW 40.9 81.1 -  91.4 %   Platelets 203 150 - 400 K/uL   nRBC 0.0 0.0 - 0.2 %  Comprehensive metabolic panel   Collection Time: 10/23/23 12:28 PM  Result Value Ref Range   Sodium 140 135 -  145 mmol/L   Potassium 4.2 3.5 - 5.1 mmol/L   Chloride 99 98 - 111 mmol/L   CO2 31 22 - 32 mmol/L   Glucose, Bld 99 70 - 99 mg/dL   BUN 15 8 - 23 mg/dL   Creatinine, Ser 1.61 0.44 - 1.00 mg/dL   Calcium 9.0 8.9 - 09.6 mg/dL   Total Protein 7.3 6.5 - 8.1 g/dL   Albumin 3.9 3.5 - 5.0 g/dL   AST 15 15 - 41 U/L   ALT 15 0 - 44 U/L   Alkaline Phosphatase 82 38 - 126 U/L   Total Bilirubin 0.5 0.0 - 1.2 mg/dL   GFR, Estimated >04 >54 mL/min   Anion gap 10 5 - 15  D-dimer, quantitative   Collection Time: 10/23/23 12:28 PM  Result Value Ref Range   D-Dimer, Quant 0.50 0.00 - 0.50 ug/mL-FEU  Troponin I (High Sensitivity)   Collection Time: 10/23/23 12:28 PM  Result Value Ref Range   Troponin I (High Sensitivity) 3 <18 ng/L   DG Chest 2 View Result Date: 10/23/2023 CLINICAL DATA:  Shortness of breath and hypoxia. EXAM: CHEST - 2 VIEW COMPARISON:  03/18/2023 FINDINGS: The heart size and mediastinal contours are within normal limits. Low bilateral lung volumes. Probable small left pleural effusion. No overt pulmonary edema. Mild pulmonary interstitial edema cannot be excluded. The visualized skeletal structures are unremarkable. IMPRESSION: Low bilateral lung volumes. Probable small left pleural effusion. Mild pulmonary interstitial edema cannot be excluded. Electronically Signed   By: Irish Lack M.D.   On: 10/23/2023 12:50   MR LUMBAR SPINE WO CONTRAST Result Date: 10/08/2023 CLINICAL DATA:  Wedge compression fracture of second lumbar vertebra. EXAM: MRI LUMBAR SPINE WITHOUT CONTRAST TECHNIQUE: Multiplanar, multisequence MR imaging of the lumbar spine was performed. No intravenous contrast was administered. COMPARISON:  08/06/2022 FINDINGS: Segmentation:  5 lumbar type vertebrae Alignment:  Mild scoliosis Vertebrae:  Essentially resolved marrow edema at the level of L2 compression fracture seen on prior. Height loss did progress eccentric towards the left, over 50% at this level where there is also mild retropulsion. There is now marrow edema and compression deformity at the T11 and L1 vertebral bodies with a horizontal hypointense fracture plane best seen at L1. No evidence of aggressive bone lesion. Conus medullaris and cauda equina: Conus extends to the T12-L1 level. Conus and cauda equina appear normal. Paraspinal and other soft tissues: Renal hilar and renal cortical cysts. No follow-up imaging recommended Disc levels: T12- L1: Disc space narrowing and circumferential bulging. Negative facets. L1-L2: Disc space narrowing and bulging. Degenerative facet spurring asymmetric to the left. Mild left foraminal narrowing L2-L3: Disc narrowing and bulging.  Negative facets. L3-L4: Disc narrowing and bulging.  Mild facet spurring. L4-L5: Disc collapse with endplate ridging and facet spurring centric to the right. Right foraminal protrusion and foraminal impingement to a moderate degree on sagittal images. L5-S1:Moderate degenerative facet spurring with mild anterolisthesis. No herniation or impingement. IMPRESSION: 1. Compression fractures at T11 and L1 which are acute or subacute. Height loss is mild at both levels and there is no retropulsion. 2. Interval healing of L2 compression fracture seen on 2023 comparison. Height loss progressed in the left vertebral body since prior. 3. Degenerative foraminal impingement on the right at L4-5 Electronically Signed   By: Tiburcio Pea M.D.   On: 10/08/2023 09:48     EKG EKG Interpretation Date/Time:  Tuesday October 23 2023 11:43:02 EST Ventricular Rate:  107 PR Interval:  158 QRS Duration:  99 QT Interval:  328 QTC Calculation: 438 R Axis:   44  Text Interpretation: Sinus tachycardia Confirmed by Cathren Laine ([id number]) on 10/23/2023 11:53:12 AM  Radiology DG Chest 2  View Result Date: 10/23/2023 CLINICAL DATA:  Shortness of breath and hypoxia. EXAM: CHEST - 2 VIEW COMPARISON:  03/18/2023 FINDINGS: The heart size and mediastinal contours are within normal limits. Low bilateral lung volumes. Probable small left pleural effusion. No overt pulmonary edema. Mild pulmonary interstitial edema cannot be excluded. The visualized skeletal structures are unremarkable. IMPRESSION: Low bilateral lung volumes. Probable small left pleural effusion. Mild pulmonary interstitial edema cannot be excluded. Electronically Signed   By: Irish Lack M.D.   On: 10/23/2023 12:50    Procedures Procedures    Medications Ordered in ED Medications  albuterol (PROVENTIL) (2.5 MG/3ML) 0.083% nebulizer solution 5 mg (has no administration in time range)  ipratropium (ATROVENT) nebulizer solution 0.5 mg (has no administration in time range)  furosemide (LASIX) injection 40 mg (has no administration in time range)  acetaminophen (TYLENOL) tablet 1,000 mg (1,000 mg Oral Given 10/23/23 1332)    ED Course/ Medical Decision Making/ A&P                                 Medical Decision Making Problems Addressed: Acute on chronic respiratory failure with hypoxia [facility]): acute illness or injury with systemic symptoms that poses a threat to life or bodily functions    Details: Acute and chronic Chronic diastolic heart failure (HCC): acute illness or injury with systemic symptoms that poses a threat to life or bodily functions Obstructive sleep apnea: chronic illness or injury with exacerbation, progression, or side effects of treatment that poses a threat to life or bodily functions Sinus tachycardia: acute illness or injury SOB (shortness of breath): acute illness or injury with systemic symptoms that poses a threat to life or bodily functions  Amount and/or Complexity of Data Reviewed Independent Historian:     Details: Family, hx External Data Reviewed: labs, radiology and  notes. Labs: ordered. Decision-making details documented in ED Course. Radiology: ordered and independent interpretation performed. Decision-making details documented in ED Course. ECG/medicine tests: ordered and independent interpretation performed. Decision-making details documented in ED Course. Discussion of management or test interpretation with external provider(s): medicine  Risk OTC drugs. Prescription drug management. Decision regarding hospitalization.   Iv ns. Continuous pulse ox and cardiac monitoring. Labs ordered/sent. Imaging ordered.   Differential diagnosis includes pna, chf, arf, etc. Dispo decision including potential need for admission considered - will get labs and imaging and reassess.   Reviewed nursing notes and prior charts for additional history. External reports reviewed. Additional history from: family.  Hospital stay from 2022 reviewed, po2 in mid 50's then - suspect some element of chronic resp failure, but now more hypoxic.   Room air sats in 70s, on 4 liters o2 sats 95%. Albuterol neb. Pt given diuretic dose iv.   Cardiac monitor: sinus rhythm, rate 104.  Labs reviewed/interpreted by me - wbc and hgb normal. Trop normal. Bnp not significantly elevated. Covid and flu neg.   Xrays reviewed/interpreted by me - mild vascular congestion, no pna.   On record review, similar presentation 2022 - at that time ddimer normal and cta done that was negative for PE. Pt was significantly hypoxic on abg then.  At that time, her covid test was positive. Pt/family indicate has not been on chronic home o2, but  rather bought herself a small tank to have available on prn basis.  Given significant hypoxia, medicine consulted for admission.   CRITICAL CARE RE: acute on chronic respiratory failure with hypoxia, OSA w hypoxia, diastolic chf.  Performed by: Suzi Roots Total critical care time: 40 minutes Critical care time was exclusive of separately billable procedures and  treating other patients. Critical care was necessary to treat or prevent imminent or life-threatening deterioration. Critical care was time spent personally by me on the following activities: development of treatment plan with patient and/or surrogate as well as nursing, discussions with consultants, evaluation of patient's response to treatment, examination of patient, obtaining history from patient or surrogate, ordering and performing treatments and interventions, ordering and review of laboratory studies, ordering and review of radiographic studies, pulse oximetry and re-evaluation of patient's condition.         Final Clinical Impression(s) / ED Diagnoses Final diagnoses:  None    Rx / DC Orders ED Discharge Orders     None         Cathren Laine, MD 10/23/23 1356

## 2023-10-23 NOTE — H&P (Signed)
History and Physical  Kristina [name] ZOX:[id number] DOB: 11-28-1950 DOA: 10/23/2023  PCP: Benita Stabile, MD   Chief Complaint: Hypoxia  HPI: Kristina [name] is a 73 y.o. female with medical history significant for HFrecEF, HTN, HLD, GERD, osteoporosis, chronic low back pain, T2DM and OSA who presents to the ED for evaluation of low oxygen. Patient reports she woke up this morning with mild shortness of breath. While in her kitchen, she had some difficulty holding onto things and dropping objects from her hand. She checked her SpO2 and was low in the 70s. She has a supplemental O2 machine that she bought online that she uses as needed during the day. She she reports some headache but denies any cough, chest pain, dizziness, fevers, chills, orthopnea, PND, dysuria, hematuria or leg swelling.  ED Course: Initial vitals showed tachycardia with HR 100 100s and hypertensive with SpO2 in the 140s to 160s, SpO2 of 98% on 4 LNC.  Labs showed normal BNP of 70, D-dimer, troponin, CBC, CMP, Phos and mag. Negative flu, RSV and COVID test. EKG shows sinus tach. Chest x-ray shows possible mild pulmonary interstitial edema. Patient received IV Lasix 40 mg x 1 and DuoNebs. TRH was consulted for admission  Review of Systems: Please see HPI for pertinent positives and negatives. A complete 10 system review of systems are otherwise negative.  Past Medical History:  Diagnosis Date   Acute respiratory distress syndrome (ARDS) due to COVID-19 virus (HCC) 09/2019   Chronic combined systolic and diastolic CHF (congestive heart failure) (HCC)    Diabetes mellitus, type 2 (HCC)    GERD (gastroesophageal reflux disease)    Hyperlipidemia    Hypertension    Past Surgical History:  Procedure Laterality Date   COLONOSCOPY N/A 12/21/2016   Procedure: COLONOSCOPY;  Surgeon: Malissa Hippo, MD;  [facility];  Service: Endoscopy;  Laterality: N/A;  930   NO PAST SURGERIES     ORIF ACETABULAR FRACTURE Left  03/16/2023   Procedure: OPEN REDUCTION INTERNAL FIXATION (ORIF) ACETABULAR FRACTURE STOPPA APPROACH;  Surgeon: Roby Lofts, MD;  Location: [facility];  Service: Orthopedics;  Laterality: Left;   Social History:  reports that she has never smoked. She has never used smokeless tobacco. She reports that she does not drink alcohol and does not use drugs.  No Known Allergies  Family History  Problem Relation Age of Onset   Breast cancer Mother    Heart attack Father    Colon cancer Neg Hx      Prior to Admission medications   Medication Sig Start Date End Date Taking? Authorizing Provider  acetaminophen (TYLENOL) 325 MG tablet Take 2 tablets (650 mg total) by mouth every 6 (six) hours as needed for mild pain (or Fever >/= 101). 04/09/21  Yes Emokpae, Courage, MD  amLODipine (NORVASC) 10 MG tablet Take 1 tablet (10 mg total) by mouth daily. 04/09/21  Yes Emokpae, Courage, MD  carvedilol (COREG) 6.25 MG tablet TAKE 1 TABLET TWICE A DAY 09/12/23  Yes Branch, Dorothe Pea, MD  denosumab (PROLIA) 60 MG/ML SOSY injection INJECT 60 MG INTO THE SKIN EVERY 6 MONTHS. 07/25/23  Yes [provider]  docusate sodium (COLACE) 100 MG capsule Take 1 capsule (100 mg total) by mouth 2 (two) times daily. 03/19/23  Yes Sherryll Burger, Pratik D, DO  empagliflozin (JARDIANCE) 10 MG TABS tablet Take 1 tablet (10 mg total) by mouth daily before breakfast. 08/07/23  Yes Branch, Dorothe Pea, MD  furosemide (LASIX) 40  MG tablet Take 1 tablet (40 mg total) by mouth daily. 04/10/21  Yes Shon Hale, MD  HYDROcodone-acetaminophen (NORCO/VICODIN) 5-325 MG tablet Take 1-2 tablets by mouth every 6 (six) hours as needed for moderate pain or severe pain. 03/19/23  Yes Shah, Pratik D, DO  LINZESS 145 MCG CAPS capsule Take 145 mcg by mouth daily. 06/06/23  Yes [provider]  losartan (COZAAR) 100 MG tablet Take 1 tablet (100 mg total) by mouth daily. 04/10/21  Yes Emokpae, Courage, MD  methocarbamol (ROBAXIN) 500 MG tablet Take  1 tablet (500 mg total) by mouth every 6 (six) hours as needed for muscle spasms. 03/19/23  Yes Shah, Pratik D, DO  Multiple Vitamin (MULTIVITAMIN) tablet Take 1 tablet by mouth daily.   Yes [provider]  omeprazole (PRILOSEC) 40 MG capsule Take 40 mg by mouth daily. 07/24/19  Yes [provider]  rosuvastatin (CRESTOR) 10 MG tablet Take 10 mg by mouth daily.   Yes [provider]  VASCEPA 1 g capsule Take 2 g by mouth 2 (two) times daily. 06/21/23  Yes [provider]    Physical Exam: BP 131/78   Pulse 99   Temp 97.6 F (36.4 C) (Oral)   Resp 14   Ht 4\' 10"  (1.473 m)   Wt 83 kg   SpO2 93%   BMI 38.24 kg/m  General: Pleasant, well-appearing elderly woman laying in bed. No acute distress. HEENT: [place]/AT. Anicteric sclera Neck: Supple. No JVD. CV: Tachycardic. Regular rhythm. No murmurs, rubs, or gallops. Trace BLE edema. Pulmonary: Lungs CTAB. Normal effort. No wheezing. Rales at the bases. Abdominal: Soft, nontender, nondistended. Normal bowel sounds. Extremities: Palpable radial and DP pulses. Normal ROM. Skin: Warm and dry. No obvious rash or lesions. Neuro: A&Ox3. Moves all extremities. Normal sensation to light touch. No focal deficit. Psych: Normal mood and affect          Labs on Admission:  Basic Metabolic Panel: Recent Labs  Lab 10/23/23 1228  NA 140  K 4.2  CL 99  CO2 31  GLUCOSE 99  BUN 15  CREATININE 0.45  CALCIUM 9.0   Liver Function Tests: Recent Labs  Lab 10/23/23 1228  AST 15  ALT 15  ALKPHOS 82  BILITOT 0.5  PROT 7.3  ALBUMIN 3.9   No results for input(s): "LIPASE", "AMYLASE" in the last 168 hours. No results for input(s): "AMMONIA" in the last 168 hours. CBC: Recent Labs  Lab 10/23/23 1228  WBC 9.6  HGB 14.1  HCT 46.6*  MCV 96.9  PLT 203   Cardiac Enzymes: No results for input(s): "CKTOTAL", "CKMB", "CKMBINDEX", "TROPONINI" in the last 168 hours. BNP (last 3 results) Recent Labs     10/23/23 1219  BNP 70.0    ProBNP (last 3 results) No results for input(s): "PROBNP" in the last 8760 hours.  CBG: No results for input(s): "GLUCAP" in the last 168 hours.  Radiological Exams on Admission: DG Chest 2 View Result Date: 10/23/2023 CLINICAL DATA:  Shortness of breath and hypoxia. EXAM: CHEST - 2 VIEW COMPARISON:  03/18/2023 FINDINGS: The heart size and mediastinal contours are within normal limits. Low bilateral lung volumes. Probable small left pleural effusion. No overt pulmonary edema. Mild pulmonary interstitial edema cannot be excluded. The visualized skeletal structures are unremarkable. IMPRESSION: Low bilateral lung volumes. Probable small left pleural effusion. Mild pulmonary interstitial edema cannot be excluded. Electronically Signed   By: Irish Lack M.D.   On: 10/23/2023 12:50   Assessment/Plan Terasa  H Raynes is a 73 y.o. female with medical history significant for  HFrecEF, HTN, HLD, GERD, osteoporosis, chronic low back pain, T2DM and OSA who presents to the ED for evaluation of low oxygen and admitted for acute on chronic hypoxic respiratory failure secondary to CHF exacerbation.  # Acute on chronic hypoxic respiratory failure # Acute on chronic combined systolic and diastolic heart failure Last TTE in June 2024 showed recovery of EF from 45-50% in 2021 to 60-65%, mild LVH, G1DD but no valvular abnormalities. Found to be hypoxic requiring 4 L of O2. CXR showed mild pulmonary interstitial edema, patient mildly fluid overloaded on exam. S/p IV lasix 40 mg in the ED. EKG without any acute ischemic changes. Normal electrolyte and kidney function. -Admit to telemetry bed -Continue IV Lasix 40 mg daily -Follow-up repeat TTE -Resume GDMT including Coreg, Jardiance and losartan -Continue supplemental O2, wean as able -As needed DuoNebs -Strict I&O's, daily weight -Trend and replete electrolytes  # HTN BP elevated with SBP in the 130s to 160s. -Continue on  Coreg, losartan and amlodipine  # T2DM Last A1c 5.7% 7 months ago. Blood glucose of 99 on CMP. -Continue Jardiance -SSI with meals, CBG monitoring -Follow-up repeat A1c  # HLD # Hypertriglyceridemia Repeat lipid panel today shows LDL of 57, at goal and triglyceride improved to 169. -Continue rosuvastatin and Vascepa  # Osteoporosis On denosumab injections every 6 months -Follow-up vitamin D levels  # OSA -CPAP at bedtime  # IBS -Continue on Linzess and Colace -Follow-up vitamin B12 levels  # Chronic back pain -Continue as needed Norco and Robaxin  # GERD -PPI  DVT prophylaxis: Lovenox     Code Status: Full Code  Consults called: None  Family Communication: No family at bedside  Severity of Illness: The appropriate patient status for this patient is INPATIENT. Inpatient status is judged to be reasonable and necessary in order to provide the required intensity of service to ensure the patient's safety. The patient's presenting symptoms, physical exam findings, and initial radiographic and laboratory data in the context of their chronic comorbidities is felt to place them at high risk for further clinical deterioration. Furthermore, it is not anticipated that the patient will be medically stable for discharge from the hospital within 2 midnights of admission.   * I certify that at the point of admission it is my clinical judgment that the patient will require inpatient hospital care spanning beyond 2 midnights from the point of admission due to high intensity of service, high risk for further deterioration and high frequency of surveillance required.*  Level of care: Telemetry   Steffanie Rainwater, MD 10/23/2023, 4:51 PM [facility] Pager: [phone] Isaiah 41:10   If 7PM-7AM, please contact night-coverage [email or web address] Password [email or web address]

## 2023-10-23 NOTE — ED Triage Notes (Signed)
Pt states she checked her O2 sats at home and it read 70s so she came to ED. Pts O2 is 76 on RA. Pt placed on4L .

## 2023-10-23 NOTE — ED Notes (Signed)
ED TO INPATIENT HANDOFF REPORT  ED Nurse Name and Phone #: Ephriam Knuckles Medic  S Name/Age/Gender Kristina [name] 73 y.o. female Room/Bed: APA04/APA04  Code Status   Code Status: Full Code  Home/SNF/Other Home Patient oriented to: self, place, time, and situation Is this baseline? Yes   Triage Complete: Triage complete  Chief Complaint Acute on chronic diastolic (congestive) heart failure (HCC) [I50.33]  Triage Note Pt states she checked her O2 sats at home and it read 70s so she came to ED. Pts O2 is 76 on RA. Pt placed on4L .   Allergies No Known Allergies  Level of Care/Admitting Diagnosis ED Disposition     ED Disposition  Admit   Condition  --   Comment  Hospital Area: [facility] [[id number]]  Level of Care: Telemetry [5]  Covid Evaluation: Asymptomatic - no recent exposure (last 10 days) testing not required  Diagnosis: Acute on chronic diastolic (congestive) heart failure [facility]) [[id number]]  Admitting Physician: Steffanie Rainwater [[id number]]  Attending Physician: Steffanie Rainwater [[id number]]  Certification:: I certify this patient will need inpatient services for at least 2 midnights  Expected Medical Readiness: 10/26/2023          B Medical/Surgery History Past Medical History:  Diagnosis Date   Acute respiratory distress syndrome (ARDS) due to COVID-19 virus (HCC) 09/2019   Chronic combined systolic and diastolic CHF (congestive heart failure) (HCC)    Diabetes mellitus, type 2 (HCC)    GERD (gastroesophageal reflux disease)    Hyperlipidemia    Hypertension    Past Surgical History:  Procedure Laterality Date   COLONOSCOPY N/A 12/21/2016   Procedure: COLONOSCOPY;  Surgeon: Malissa Hippo, MD;  [facility];  Service: Endoscopy;  Laterality: N/A;  930   NO PAST SURGERIES     ORIF ACETABULAR FRACTURE Left 03/16/2023   Procedure: OPEN REDUCTION INTERNAL FIXATION (ORIF) ACETABULAR FRACTURE STOPPA APPROACH;  Surgeon: Roby Lofts,  MD;  Location: [facility] OR;  Service: Orthopedics;  Laterality: Left;     A IV Location/Drains/Wounds Patient Lines/Drains/Airways Status     Active Line/Drains/Airways     Name Placement date Placement time Site Days   Peripheral IV 10/23/23 20 G 1" Right Antecubital 10/23/23  1228  Antecubital  less than 1            Intake/Output Last 24 hours No intake or output data in the 24 hours ending 10/23/23 1928  Labs/Imaging Results for orders placed or performed during the hospital encounter of 10/23/23 (from the past 48 hours)  Brain natriuretic peptide     Status: None   Collection Time: 10/23/23 12:19 PM  Result Value Ref Range   B Natriuretic Peptide 70.0 0.0 - 100.0 pg/mL    Comment: Performed at [facility], [address], [place], [place] [address]  Resp panel by RT-PCR (RSV, Flu A&B, Covid) Anterior Nasal Swab     Status: None   Collection Time: 10/23/23 12:19 PM   Specimen: Anterior Nasal Swab  Result Value Ref Range   SARS Coronavirus 2 by RT PCR NEGATIVE NEGATIVE    Comment: (NOTE) SARS-CoV-2 target nucleic acids are NOT DETECTED.  The SARS-CoV-2 RNA is generally detectable in upper respiratory specimens during the acute phase of infection. The lowest concentration of SARS-CoV-2 viral copies this assay can detect is 138 copies/mL. A negative result does not preclude SARS-Cov-2 infection and should not be used as the sole basis for treatment or other patient management decisions. A negative  result may occur with  improper specimen collection/handling, submission of specimen other than nasopharyngeal swab, presence of viral mutation(s) within the areas targeted by this assay, and inadequate number of viral copies(<138 copies/mL). A negative result must be combined with clinical observations, patient history, and epidemiological information. The expected result is Negative.  Fact Sheet for Patients:  BloggerCourse.com  Fact Sheet for  Healthcare Providers:  SeriousBroker.it  This test is no t yet approved or cleared by the [place] FDA and  has been authorized for detection and/or diagnosis of SARS-CoV-2 by FDA under an Emergency Use Authorization (EUA). This EUA will remain  in effect (meaning this test can be used) for the duration of the COVID-19 declaration under Section 564(b)(1) of the Act, 21 U.S.C.section 360bbb-3(b)(1), unless the authorization is terminated  or revoked sooner.       Influenza A by PCR NEGATIVE NEGATIVE   Influenza B by PCR NEGATIVE NEGATIVE    Comment: (NOTE) The Xpert Xpress SARS-CoV-2/FLU/RSV plus assay is intended as an aid in the diagnosis of influenza from Nasopharyngeal swab specimens and should not be used as a sole basis for treatment. Nasal washings and aspirates are unacceptable for Xpert Xpress SARS-CoV-2/FLU/RSV testing.  Fact Sheet for Patients: BloggerCourse.com  Fact Sheet for Healthcare Providers: SeriousBroker.it  This test is not yet approved or cleared by the [place] FDA and has been authorized for detection and/or diagnosis of SARS-CoV-2 by FDA under an Emergency Use Authorization (EUA). This EUA will remain in effect (meaning this test can be used) for the duration of the COVID-19 declaration under Section 564(b)(1) of the Act, 21 U.S.C. section 360bbb-3(b)(1), unless the authorization is terminated or revoked.     Resp Syncytial Virus by PCR NEGATIVE NEGATIVE    Comment: (NOTE) Fact Sheet for Patients: BloggerCourse.com  Fact Sheet for Healthcare Providers: SeriousBroker.it  This test is not yet approved or cleared by the [place] FDA and has been authorized for detection and/or diagnosis of SARS-CoV-2 by FDA under an Emergency Use Authorization (EUA). This EUA will remain in effect (meaning this test can be used)  for the duration of the COVID-19 declaration under Section 564(b)(1) of the Act, 21 U.S.C. section 360bbb-3(b)(1), unless the authorization is terminated or revoked.  Performed at [facility], [address], [place], [place] [address]   CBC     Status: Abnormal   Collection Time: 10/23/23 12:28 PM  Result Value Ref Range   WBC 9.6 4.0 - 10.5 K/uL   RBC 4.81 3.87 - 5.11 MIL/uL   Hemoglobin 14.1 12.0 - 15.0 g/dL   HCT 86.5 (H) 78.4 - 69.6 %   MCV 96.9 80.0 - 100.0 fL   MCH 29.3 26.0 - 34.0 pg   MCHC 30.3 30.0 - 36.0 g/dL   RDW 29.5 28.4 - 13.2 %   Platelets 203 150 - 400 K/uL   nRBC 0.0 0.0 - 0.2 %    Comment: Performed at [facility], [address], [place], [place] [address]  Comprehensive metabolic panel     Status: None   Collection Time: 10/23/23 12:28 PM  Result Value Ref Range   Sodium 140 135 - 145 mmol/L   Potassium 4.2 3.5 - 5.1 mmol/L   Chloride 99 98 - 111 mmol/L   CO2 31 22 - 32 mmol/L   Glucose, Bld 99 70 - 99 mg/dL    Comment: Glucose reference range applies only to samples taken after fasting for at least 8 hours.   BUN 15  8 - 23 mg/dL   Creatinine, Ser 4.09 0.44 - 1.00 mg/dL   Calcium 9.0 8.9 - 81.1 mg/dL   Total Protein 7.3 6.5 - 8.1 g/dL   Albumin 3.9 3.5 - 5.0 g/dL   AST 15 15 - 41 U/L   ALT 15 0 - 44 U/L   Alkaline Phosphatase 82 38 - 126 U/L   Total Bilirubin 0.5 0.0 - 1.2 mg/dL   GFR, Estimated >91 >47 mL/min    Comment: (NOTE) Calculated using the CKD-EPI Creatinine Equation (2021)    Anion gap 10 5 - 15    Comment: Performed at [facility] [address], [place], [place] [address]  Troponin I (High Sensitivity)     Status: None   Collection Time: 10/23/23 12:28 PM  Result Value Ref Range   Troponin I (High Sensitivity) 3 <18 ng/L    Comment: (NOTE) Elevated high sensitivity troponin I (hsTnI) values and significant  changes across serial measurements may suggest ACS but many other  chronic and acute conditions are known to elevate hsTnI  results.  Refer to the "Links" section for chest pain algorithms and additional  guidance. Performed at [facility], [address] [place], [place] [address]   D-dimer, quantitative     Status: None   Collection Time: 10/23/23 12:28 PM  Result Value Ref Range   D-Dimer, Quant 0.50 0.00 - 0.50 ug/mL-FEU    Comment: (NOTE) At the manufacturer cut-off value of 0.5 g/mL FEU, this assay has a negative predictive value of 95-100%.This assay is intended for use in conjunction with a clinical pretest probability (PTP) assessment model to exclude pulmonary embolism (PE) and deep venous thrombosis (DVT) in outpatients suspected of PE or DVT. Results should be correlated with clinical presentation. Performed at [facility], [address], [place], [place] [address]   Magnesium     Status: None   Collection Time: 10/23/23  5:00 PM  Result Value Ref Range   Magnesium 1.9 1.7 - 2.4 mg/dL    Comment: Performed at [facility], [address] [place], [place] [address]  Phosphorus     Status: None   Collection Time: 10/23/23  5:00 PM  Result Value Ref Range   Phosphorus 3.6 2.5 - 4.6 mg/dL    Comment: Performed at [facility], [address] [place], [place] [address]  Lipid panel     Status: Abnormal   Collection Time: 10/23/23  5:00 PM  Result Value Ref Range   Cholesterol 148 0 - 200 mg/dL   Triglycerides 324 (H) <150 mg/dL   HDL 57 >40 mg/dL   Total CHOL/HDL Ratio 2.6 RATIO   VLDL 34 0 - 40 mg/dL   LDL Cholesterol 57 0 - 99 mg/dL    Comment:        Total Cholesterol/HDL:CHD Risk Coronary Heart Disease Risk Table                     Men   Women  1/2 Average Risk   3.4   3.3  Average Risk       5.0   4.4  2 X Average Risk   9.6   7.1  3 X Average Risk  23.4   11.0        Use the calculated Patient Ratio above and the CHD Risk Table to determine the patient's CHD Risk.        ATP III CLASSIFICATION (LDL):  <100     mg/dL  Optimal  100-129  mg/dL   Near or Above                     Optimal  130-159  mg/dL   Borderline  119-147  mg/dL   High  >829     mg/dL   Very High Performed at [facility], [address], [place], [place] [address]    DG Chest 2 View Result Date: 10/23/2023 CLINICAL DATA:  Shortness of breath and hypoxia. EXAM: CHEST - 2 VIEW COMPARISON:  03/18/2023 FINDINGS: The heart size and mediastinal contours are within normal limits. Low bilateral lung volumes. Probable small left pleural effusion. No overt pulmonary edema. Mild pulmonary interstitial edema cannot be excluded. The visualized skeletal structures are unremarkable. IMPRESSION: Low bilateral lung volumes. Probable small left pleural effusion. Mild pulmonary interstitial edema cannot be excluded. Electronically Signed   By: Irish Lack M.D.   On: 10/23/2023 12:50    Pending Labs Unresulted Labs (From admission, onward)     Start     Ordered   10/24/23 0500  Basic metabolic panel  Tomorrow morning,   R        10/23/23 1853   10/24/23 0500  CBC  Tomorrow morning,   R        10/23/23 1853   10/23/23 1627  Vitamin B12  Once,   R        10/23/23 1626   10/23/23 1627  Hemoglobin A1c  Once,   R        10/23/23 1626   10/23/23 1626  TSH  Once,   R        10/23/23 1626   10/23/23 1626  VITAMIN D 25 Hydroxy (Vit-D Deficiency, Fractures)  Once,   R        10/23/23 1626            Vitals/Pain Today's Vitals   10/23/23 1409 10/23/23 1537 10/23/23 1545 10/23/23 1600  BP:   136/75 131/78  Pulse:   95 99  Resp:   18 14  Temp:  97.6 F (36.4 C)    TempSrc:  Oral    SpO2:   91% 93%  Weight:      Height:      PainSc: 3        Isolation Precautions No active isolations  Medications Medications  enoxaparin (LOVENOX) injection 40 mg (has no administration in time range)  acetaminophen (TYLENOL) tablet 650 mg (has no administration in time range)    Or  acetaminophen (TYLENOL) suppository 650 mg (has no administration in time range)  senna-docusate (Senokot-S) tablet 1 tablet  (has no administration in time range)  ondansetron (ZOFRAN) tablet 4 mg (has no administration in time range)    Or  ondansetron (ZOFRAN) injection 4 mg (has no administration in time range)  amLODipine (NORVASC) tablet 10 mg (has no administration in time range)  carvedilol (COREG) tablet 6.25 mg (has no administration in time range)  docusate sodium (COLACE) capsule 100 mg (has no administration in time range)  empagliflozin (JARDIANCE) tablet 10 mg (has no administration in time range)  HYDROcodone-acetaminophen (NORCO/VICODIN) 5-325 MG per tablet 1-2 tablet (has no administration in time range)  linaclotide (LINZESS) capsule 145 mcg (has no administration in time range)  losartan (COZAAR) tablet 100 mg (has no administration in time range)  methocarbamol (ROBAXIN) tablet 500 mg (has no administration in time range)  multivitamin tablet 1 tablet (has no administration in time range)  pantoprazole (  PROTONIX) EC tablet 40 mg (has no administration in time range)  rosuvastatin (CRESTOR) tablet 10 mg (has no administration in time range)  icosapent Ethyl (VASCEPA) 1 g capsule 2 g (has no administration in time range)  furosemide (LASIX) injection 40 mg (has no administration in time range)  ipratropium-albuterol (DUONEB) 0.5-2.5 (3) MG/3ML nebulizer solution 3 mL (has no administration in time range)  insulin aspart (novoLOG) injection 0-15 Units (has no administration in time range)  acetaminophen (TYLENOL) tablet 1,000 mg (1,000 mg Oral Given 10/23/23 1332)  albuterol (PROVENTIL) (2.5 MG/3ML) 0.083% nebulizer solution 5 mg (5 mg Nebulization Given 10/23/23 1347)  ipratropium (ATROVENT) nebulizer solution 0.5 mg (0.5 mg Nebulization Given 10/23/23 1347)  furosemide (LASIX) injection 40 mg (40 mg Intravenous Given 10/23/23 1403)    Mobility walks     Focused Assessments    R Recommendations: See Admitting Provider Note  Report given to: Jill Alexanders   Additional Notes:

## 2023-10-24 ENCOUNTER — Inpatient Hospital Stay (HOSPITAL_COMMUNITY): Payer: Medicare HMO

## 2023-10-24 ENCOUNTER — Other Ambulatory Visit (HOSPITAL_COMMUNITY): Payer: Self-pay | Admitting: *Deleted

## 2023-10-24 DIAGNOSIS — R0609 Other forms of dyspnea: Secondary | ICD-10-CM

## 2023-10-24 DIAGNOSIS — I5033 Acute on chronic diastolic (congestive) heart failure: Secondary | ICD-10-CM | POA: Diagnosis not present

## 2023-10-24 DIAGNOSIS — E662 Morbid (severe) obesity with alveolar hypoventilation: Secondary | ICD-10-CM | POA: Diagnosis not present

## 2023-10-24 DIAGNOSIS — J9601 Acute respiratory failure with hypoxia: Secondary | ICD-10-CM | POA: Diagnosis not present

## 2023-10-24 DIAGNOSIS — E669 Obesity, unspecified: Secondary | ICD-10-CM

## 2023-10-24 LAB — CBC
HCT: 45.3 % (ref 36.0–46.0)
Hemoglobin: 13.2 g/dL (ref 12.0–15.0)
MCH: 28.3 pg (ref 26.0–34.0)
MCHC: 29.1 g/dL — ABNORMAL LOW (ref 30.0–36.0)
MCV: 97 fL (ref 80.0–100.0)
Platelets: 201 10*3/uL (ref 150–400)
RBC: 4.67 MIL/uL (ref 3.87–5.11)
RDW: 13.8 % (ref 11.5–15.5)
WBC: 9.5 10*3/uL (ref 4.0–10.5)
nRBC: 0 % (ref 0.0–0.2)

## 2023-10-24 LAB — GLUCOSE, CAPILLARY
Glucose-Capillary: 106 mg/dL — ABNORMAL HIGH (ref 70–99)
Glucose-Capillary: 215 mg/dL — ABNORMAL HIGH (ref 70–99)
Glucose-Capillary: 141 mg/dL — ABNORMAL HIGH (ref 70–99)
Glucose-Capillary: 168 mg/dL — ABNORMAL HIGH (ref 70–99)

## 2023-10-24 LAB — ECHOCARDIOGRAM COMPLETE
AR max vel: 2.36 cm2
AV Area VTI: 2.39 cm2
AV Area mean vel: 2.35 cm2
AV Mean grad: 8 mm[Hg]
AV Peak grad: 16 mm[Hg]
Ao pk vel: 2 m/s
Area-P 1/2: 2.95 cm2
Height: 58 in
S' Lateral: 2.9 cm
Weight: 2924.18 [oz_av]

## 2023-10-24 LAB — BASIC METABOLIC PANEL
Anion gap: 7 (ref 5–15)
BUN: 15 mg/dL (ref 8–23)
CO2: 34 mmol/L — ABNORMAL HIGH (ref 22–32)
Calcium: 8.3 mg/dL — ABNORMAL LOW (ref 8.9–10.3)
Chloride: 100 mmol/L (ref 98–111)
Creatinine, Ser: 0.46 mg/dL (ref 0.44–1.00)
GFR, Estimated: >60 mL/min (ref >60)
Glucose, Bld: 114 mg/dL — ABNORMAL HIGH (ref 70–99)
Potassium: 3.7 mmol/L (ref 3.5–5.1)
Sodium: 141 mmol/L (ref 135–145)

## 2023-10-24 MED ORDER — FUROSEMIDE 40 MG PO TABS
40.0000 mg | ORAL_TABLET | Freq: Every day | ORAL | Status: DC
  Administered 2023-10-25: 40 mg via ORAL
  Filled 2023-10-24: qty 1

## 2023-10-24 NOTE — Progress Notes (Signed)
Progress Note   Patient: Kristina [name] LKG:[id number] DOB: 29-Oct-1950 DOA: 10/23/2023     1 DOS: the patient was seen and examined on 10/24/2023   Brief hospital admission course: As per H&P written by Dr.Amposah On 10/23/2023 MAKENNA [name] is a 73 y.o. female with medical history significant for HFrecEF, HTN, HLD, GERD, osteoporosis, chronic low back pain, T2DM and OSA who presents to the ED for evaluation of low oxygen. Patient reports she woke up this morning with mild shortness of breath. While in her kitchen, she had some difficulty holding onto things and dropping objects from her hand. She checked her SpO2 and was low in the 70s. She has a supplemental O2 machine that she bought online that she uses as needed during the day. She she reports some headache but denies any cough, chest pain, dizziness, fevers, chills, orthopnea, PND, dysuria, hematuria or leg swelling.   ED Course: Initial vitals showed tachycardia with HR 100 100s and hypertensive with SpO2 in the 140s to 160s, SpO2 of 98% on 4 LNC.  Labs showed normal BNP of 70, D-dimer, troponin, CBC, CMP, Phos and mag. Negative flu, RSV and COVID test. EKG shows sinus tach. Chest x-ray shows possible mild pulmonary interstitial edema. Patient received IV Lasix 40 mg x 1 and DuoNebs. TRH was consulted for admission  Assessment and Plan: 1-acute hypoxic respiratory failure -With presumption for chronic hypoxic failure at baseline -Patient with underlying history of obstructive sleep apnea (noncompliant with CPAP), most likely also experiencing obesity hypoventilation syndrome. -There was concern at time of admission for acute on chronic diastolic heart failure. -BMP was normal (given patient will basically could be misleading); patient reporting no orthopnea and expressed no significant weight gain fluctuation. -At time of admission chest x-ray with concerns for mild interstitial edema could not be excluded -After IV diuresis patient's  symptoms improved and no crackles were appreciated on exam.   -Patient also reports no orthopnea on today's examination.   -Low-sodium diet has been discussed with patient, continue to be compliant with home medications and daily weights. -IV diuresis will be discontinue -Will restart her home oral dose of Lasix and follow Reds clip measurement. -Desaturation screening has been requested; most likely will require home oxygen supplementation from obesity hypoventilation syndrome.  2-2 chronic diastolic heart failure -2D echo reassuring and unchanged from previous -Continue low-sodium diet, daily weights and adequate hydration -Patient has been encourage to continue good medication compliance. -Continue treatment with carvedilol, Jardiance, Cozaar and Lasix.  3-obstructive sleep apnea -Weight loss management have been discussed with patient -Patient encouraged to be compliant with the use of CPAP nightly. -Continue outpatient follow-up with pulmonologist.  4-obesity (class II) -Body mass index is 38.2 kg/m.  -Low-calorie diet, portion control and increase physical activity discussed with patient.  5-hypertension -Continue current antihypertensive agents  6-hyperlipidemia -Continue Crestor.  7-GERD -Continue PPI.  Subjective:  Afebrile, no chest pain, no nausea, no vomiting.  Reports no orthopnea and improvement in her breathing.  Currently demonstrating good saturation on 3 L nasal cannula supplementation.  Physical Exam: Vitals:   10/23/23 2324 10/24/23 0438 10/24/23 0817 10/24/23 1357  BP:  (!) 151/80 139/82 117/63  Pulse: 89 96 72 91  Resp: 18 20 20 18   Temp:  97.8 F (36.6 C) 98.6 F (37 C) 98.4 F (36.9 C)  TempSrc:   Oral   SpO2: 92% 92% 92% 92%  Weight:  82.9 kg    Height:       General exam:  Alert, awake, oriented x 3; no chest pain. Respiratory system: No wheezing or crackles appreciated on exam.  No using accessory muscles. Cardiovascular system:RRR. No  rubs or gallops; unable to assess JVD with body habitus. Gastrointestinal system: Abdomen is obese, nondistended, soft and nontender.  Positive bowel sounds. Central nervous system: No focal neurological deficits. Extremities: No cyanosis or clubbing; no edema. Skin: No petechiae. Psychiatry: Judgement and insight appear normal. Mood & affect appropriate.   Data Reviewed: Basic metabolic panel: Sodium 141, potassium 3.7, chloride 100, bicarb 34, BUN 15, creatinine 0.46 and GFR >60  Family Communication: Husband updated at bedside.  Disposition: Status is: Inpatient Remains inpatient appropriate because: Continue treatment with diuretics and assess oxygen desaturation screening.   Planned Discharge Destination: Home   Time spent: 50 minutes  Author: Vassie Loll, MD 10/24/2023 5:12 PM  For on call review [email or web address].

## 2023-10-24 NOTE — Progress Notes (Signed)
*  PRELIMINARY RESULTS* Echocardiogram 2D Echocardiogram has been performed.  Stacey Drain 10/24/2023, 3:49 PM

## 2023-10-24 NOTE — Plan of Care (Signed)

## 2023-10-24 NOTE — Progress Notes (Signed)
   10/24/23 1309  TOC Brief Assessment  Insurance and Status Reviewed  Patient has primary care physician Yes  Home environment has been reviewed Home with spouse  Prior level of function: independent  Prior/Current Home Services Current home services (home oxygen (online order - PRN))  Social Drivers of Health Review SDOH reviewed no interventions necessary  Readmission risk has been reviewed Yes  Transition of care needs transition of care needs identified, TOC will continue to follow   Patient is not new to CHF. Education added to AVS. Patient bought oxygen online and uses PRN. Medical work up continues. TOC following to see if patient needs home oxygen set up.   Transition of Care Department [facility]) has reviewed patient and no TOC needs have been identified at this time. We will continue to monitor patient advancement through interdisciplinary progression rounds. If new patient transition needs arise, please place a TOC consult.

## 2023-10-25 DIAGNOSIS — E66812 Obesity, class 2: Secondary | ICD-10-CM

## 2023-10-25 DIAGNOSIS — Z6838 Body mass index (BMI) 38.0-38.9, adult: Secondary | ICD-10-CM

## 2023-10-25 DIAGNOSIS — I5033 Acute on chronic diastolic (congestive) heart failure: Secondary | ICD-10-CM | POA: Diagnosis not present

## 2023-10-25 LAB — BASIC METABOLIC PANEL
Anion gap: 9 (ref 5–15)
BUN: 17 mg/dL (ref 8–23)
CO2: 34 mmol/L — ABNORMAL HIGH (ref 22–32)
Calcium: 8.3 mg/dL — ABNORMAL LOW (ref 8.9–10.3)
Chloride: 96 mmol/L — ABNORMAL LOW (ref 98–111)
Creatinine, Ser: 0.43 mg/dL — ABNORMAL LOW (ref 0.44–1.00)
GFR, Estimated: >60 mL/min (ref >60)
Glucose, Bld: 118 mg/dL — ABNORMAL HIGH (ref 70–99)
Potassium: 3.6 mmol/L (ref 3.5–5.1)
Sodium: 139 mmol/L (ref 135–145)

## 2023-10-25 LAB — GLUCOSE, CAPILLARY
Glucose-Capillary: 101 mg/dL — ABNORMAL HIGH (ref 70–99)
Glucose-Capillary: 132 mg/dL — ABNORMAL HIGH (ref 70–99)

## 2023-10-25 NOTE — Care Management Important Message (Signed)
Important Message  Patient Details  Name: Kristina [name] MRN: [id number] Date of Birth: [birth year]   Important Message Given:  N/A - LOS <3 / Initial given by admissions     Corey Harold 10/25/2023, 10:26 AM

## 2023-10-25 NOTE — Progress Notes (Addendum)
Mobility Specialist Progress Note:    10/25/23 1020  Mobility  Activity Ambulated with assistance in hallway;Ambulated with assistance to bathroom  Level of Assistance Modified independent, requires aide device or extra time  Assistive Device None  Distance Ambulated (ft) 120 ft  Range of Motion/Exercises Active;All extremities  Activity Response Tolerated well  Mobility Referral Yes  Mobility visit 1 Mobility  Mobility Specialist Start Time (ACUTE ONLY) 0955  Mobility Specialist Stop Time (ACUTE ONLY) 1020  Mobility Specialist Time Calculation (min) (ACUTE ONLY) 25 min   Pt received in bed,agreeable to mobility.Modi to stand and ambulate with no AD. Tolerated well, see O2 sats below. Pt denies SOB, recovers quickly with deep breathing. Returned to room, left pt sitting EOB.All needs met.   SpO2 90% on RA at rest SpO2 86% on RA during ambulation SpO2 93% on 2L during ambulation  Lawerance Bach Mobility Specialist Please contact via SecureChat or  Rehab office at [phone]

## 2023-10-25 NOTE — Progress Notes (Signed)
SATURATION QUALIFICATIONS: (This note is used to comply with regulatory documentation for home oxygen)  Patient Saturations on Room Air at Rest = 90%  Patient Saturations on Room Air while Ambulating = 86%  Patient Saturations on 2 Liters of oxygen while Ambulating = 93%  Please briefly explain why patient needs home oxygen: Patient oxygen saturations decreased below 89% while walking on room air. Patient oxygen saturations stayed 93% while walking with 2L [place] supplemental oxygen.

## 2023-10-25 NOTE — Plan of Care (Signed)

## 2023-10-25 NOTE — Discharge Summary (Signed)
Physician Discharge Summary   Patient: Kristina [name] MRN: [id number] DOB: Jan 29, 1951  Admit date:     10/23/2023  Discharge date: 10/25/23  Discharge Physician: Vassie Loll   PCP: Benita Stabile, MD   Recommendations at discharge:  Repeat basic metabolic panel to follow electrolytes and renal function Blood pressure and adjust antihypertensive treatment as needed Continue assisting patient with weight loss management. Make sure patient follow-up with pulmonology service as recommended.   Discharge Diagnoses: Principal Problem:   Acute exacerbation of congestive heart failure (HCC) Active Problems:   Obstructive sleep apnea   SOB (shortness of breath)   Acute on chronic respiratory failure with hypoxia (HCC)   Class 2 severe obesity due to excess calories with serious comorbidity and body mass index (BMI) of 38.0 to 38.9 in adult [facility])  Brief Hospital admission course: As per H&P written by Dr.Amposah On 10/23/2023 Kristina [name] is a 73 y.o. female with medical history significant for HFrecEF, HTN, HLD, GERD, osteoporosis, chronic low back pain, T2DM and OSA who presents to the ED for evaluation of low oxygen. Patient reports she woke up this morning with mild shortness of breath. While in her kitchen, she had some difficulty holding onto things and dropping objects from her hand. She checked her SpO2 and was low in the 70s. She has a supplemental O2 machine that she bought online that she uses as needed during the day. She she reports some headache but denies any cough, chest pain, dizziness, fevers, chills, orthopnea, PND, dysuria, hematuria or leg swelling.   ED Course: Initial vitals showed tachycardia with HR 100 100s and hypertensive with SpO2 in the 140s to 160s, SpO2 of 98% on 4 LNC.  Labs showed normal BNP of 70, D-dimer, troponin, CBC, CMP, Phos and mag. Negative flu, RSV and COVID test. EKG shows sinus tach. Chest x-ray shows possible mild pulmonary interstitial  edema. Patient received IV Lasix 40 mg x 1 and DuoNebs. TRH was consulted for admission  Assessment and Plan: 1-acute hypoxic respiratory failure -With presumption for chronic hypoxic failure at baseline -Patient with underlying history of obstructive sleep apnea (noncompliant with CPAP), most likely also experiencing obesity hypoventilation syndrome. -There was concern at time of admission for acute on chronic diastolic heart failure. -BNP was normal (given patient's obesity could be misleading); patient reporting no orthopnea and expressed no significant weight gain fluctuation. -At time of admission chest x-ray with concerns for mild interstitial edema could not be excluded -After IV diuresis patient's symptoms improved and no crackles were appreciated; Patient also reported no orthopnea on today's examination.   -Adequate response to IV diuresis; patient advised to follow low-sodium diet, to be compliant with medication and to continue checking weight on daily basis. -Will discharge home on daily Lasix 40 mg (orally). -D saturation screening has demonstrated the need of oxygen supplementation 2 L by nasal cannula; TOC has been made aware and orders in place to provide oxygen at discharge.   2-2 chronic diastolic heart failure -2D echo reassuring and unchanged from previous -Continue low-sodium diet, daily weights and adequate hydration -Patient has been encourage to continue good medication compliance. -Continue treatment with carvedilol, Jardiance, Cozaar and Lasix. -No complaints of orthopnea, no lower extremity edema and no crackles on examination at time of discharge.   3-obstructive sleep apnea -Weight loss management have been discussed with patient -Patient encouraged to be compliant with the use of CPAP nightly. -Continue outpatient follow-up with pulmonologist.   4-obesity (class II) -Body mass  index is 38.2 kg/m.  -Low-calorie diet, portion control and increase physical  activity discussed with patient.   5-hypertension -Continue current antihypertensive agents -Low-sodium diet has been advised.   6-hyperlipidemia -Continue Crestor. -Heart healthy diet recommended.   7-GERD -Continue PPI. -Lifestyle changes discussed with patient.  Consultants: None Procedures performed: See below for x-ray reports. Disposition: Home Diet recommendation: Heart healthy/low-sodium and low calorie diet.  DISCHARGE MEDICATION: Allergies as of 10/25/2023   No Known Allergies      Medication List     TAKE these medications    acetaminophen 325 MG tablet Commonly known as: TYLENOL Take 2 tablets (650 mg total) by mouth every 6 (six) hours as needed for mild pain (or Fever >/= 101).   amLODipine 10 MG tablet Commonly known as: NORVASC Take 1 tablet (10 mg total) by mouth daily.   carvedilol 6.25 MG tablet Commonly known as: COREG TAKE 1 TABLET TWICE A DAY   docusate sodium 100 MG capsule Commonly known as: COLACE Take 1 capsule (100 mg total) by mouth 2 (two) times daily.   empagliflozin 10 MG Tabs tablet Commonly known as: Jardiance Take 1 tablet (10 mg total) by mouth daily before breakfast.   furosemide 40 MG tablet Commonly known as: LASIX Take 1 tablet (40 mg total) by mouth daily.   HYDROcodone-acetaminophen 5-325 MG tablet Commonly known as: NORCO/VICODIN Take 1-2 tablets by mouth every 6 (six) hours as needed for moderate pain or severe pain.   Linzess 145 MCG Caps capsule Generic drug: linaclotide Take 145 mcg by mouth daily.   losartan 100 MG tablet Commonly known as: COZAAR Take 1 tablet (100 mg total) by mouth daily.   methocarbamol 500 MG tablet Commonly known as: ROBAXIN Take 1 tablet (500 mg total) by mouth every 6 (six) hours as needed for muscle spasms.   multivitamin tablet Take 1 tablet by mouth daily.   omeprazole 40 MG capsule Commonly known as: PRILOSEC Take 40 mg by mouth daily.   Prolia 60 MG/ML Sosy  injection Generic drug: denosumab INJECT 60 MG INTO THE SKIN EVERY 6 MONTHS.   rosuvastatin 10 MG tablet Commonly known as: CRESTOR Take 10 mg by mouth daily.   Vascepa 1 g capsule Generic drug: icosapent Ethyl Take 2 g by mouth 2 (two) times daily.               Durable Medical Equipment  (From admission, onward)           Start     Ordered   10/25/23 1255  For home use only DME oxygen  Once       Question Answer Comment  Length of Need 12 Months   Mode or (Route) Nasal cannula   Liters per Minute 2   Frequency Continuous (stationary and portable oxygen unit needed)   Oxygen conserving device Yes   Oxygen delivery system Gas      10/25/23 1254            Follow-up Information     Benita Stabile, MD. Schedule an appointment as soon as possible for a visit in 10 day(s).   Specialty: Internal Medicine Contact information: [address] [place] [address] [phone]                Discharge Exam: Filed Weights   10/23/23 1900 10/24/23 0438 10/25/23 0307  Weight: 83.7 kg 82.9 kg 83.3 kg   General exam: Alert, awake, oriented x 3; good saturation on 2  L supplementation.  Reports no orthopnea, no chest pain, no palpitations, no nausea or vomiting. Respiratory system: Clear to auscultation. Respiratory effort normal.  No tachypnea.  No using accessory muscles. Cardiovascular system:RRR. No rubs or gallops; no JVD. Gastrointestinal system: Abdomen is obese, nondistended, soft and nontender.  Positive bowel sounds. Central nervous system: No focal neurological deficits. Extremities: No cyanosis, clubbing or edema Skin: No petechiae. Psychiatry: Judgement and insight appear normal. Mood & affect appropriate.    Condition at discharge: Stable and improved.  The results of significant diagnostics from this hospitalization (including imaging, microbiology, ancillary and laboratory) are listed below for reference.   Imaging  Studies: ECHOCARDIOGRAM COMPLETE Result Date: 10/24/2023    ECHOCARDIOGRAM REPORT   Patient Name:   ENRIKA AGUADO Date of Exam: 10/24/2023 Medical Rec #:  [id number]       Height:       58.0 in Accession #:    [id number]      Weight:       182.8 lb Date of Birth:  12-Jan-1951       BSA:          1.753 m Patient Age:    72 years        BP:           139/82 mmHg Patient Gender: F               HR:           72 bpm. [facility] Procedure: 2D Echo, Cardiac Doppler and Color Doppler Indications:    Dyspnea R06.00  History:        Patient has prior history of Echocardiogram examinations, most                 recent 03/15/2023. CHF; Risk Factors:Hypertension, Diabetes,                 Dyslipidemia and Sleep Apnea.  Sonographer:    Celesta Gentile RCS Referring Phys: [id number] PROSPER M AMPONSAH IMPRESSIONS  1. Left ventricular ejection fraction, by estimation, is 65 to 70%. The left ventricle has normal function. The left ventricle has no regional wall motion abnormalities. There is mild left ventricular hypertrophy. Left ventricular diastolic parameters are consistent with Grade I diastolic dysfunction (impaired relaxation). Elevated left atrial pressure.  2. Right ventricular systolic function is normal. The right ventricular size is normal. Tricuspid regurgitation signal is inadequate for assessing PA pressure.  3. Left atrial size was mild to moderately dilated.  4. Right atrial size was mildly dilated.  5. The mitral valve is normal in structure. No evidence of mitral valve regurgitation. No evidence of mitral stenosis.  6. The aortic valve is tricuspid. There is mild calcification of the aortic valve. There is mild thickening of the aortic valve. Aortic valve regurgitation is not visualized. No aortic stenosis is present.  7. The inferior vena cava is dilated in size with >50% respiratory variability, suggesting right atrial pressure of 8 mmHg. FINDINGS  Left Ventricle: Left ventricular ejection fraction,  by estimation, is 65 to 70%. The left ventricle has normal function. The left ventricle has no regional wall motion abnormalities. The left ventricular internal cavity size was normal in size. There is  mild left ventricular hypertrophy. Left ventricular diastolic parameters are consistent with Grade I diastolic dysfunction (impaired relaxation). Elevated left atrial pressure. Right Ventricle: The right ventricular size is normal. Right vetricular wall thickness was not well visualized. Right ventricular systolic function is normal.  Tricuspid regurgitation signal is inadequate for assessing PA pressure. Left Atrium: Left atrial size was mild to moderately dilated. Right Atrium: Right atrial size was mildly dilated. Pericardium: There is no evidence of pericardial effusion. Mitral Valve: The mitral valve is normal in structure. No evidence of mitral valve regurgitation. No evidence of mitral valve stenosis. Tricuspid Valve: The tricuspid valve is normal in structure. Tricuspid valve regurgitation is not demonstrated. No evidence of tricuspid stenosis. Aortic Valve: The aortic valve is tricuspid. There is mild calcification of the aortic valve. There is mild thickening of the aortic valve. There is mild aortic valve annular calcification. Aortic valve regurgitation is not visualized. No aortic stenosis  is present. Aortic valve mean gradient measures 8.0 mmHg. Aortic valve peak gradient measures 16.0 mmHg. Aortic valve area, by VTI measures 2.39 cm. Pulmonic Valve: The pulmonic valve was not well visualized. Pulmonic valve regurgitation is not visualized. No evidence of pulmonic stenosis. Aorta: The aortic root is normal in size and structure. Venous: The inferior vena cava is dilated in size with greater than 50% respiratory variability, suggesting right atrial pressure of 8 mmHg. IAS/Shunts: No atrial level shunt detected by color flow Doppler.  LEFT VENTRICLE PLAX 2D LVIDd:         4.70 cm   Diastology LVIDs:          2.90 cm   LV e' medial:    6.64 cm/s LV PW:         1.10 cm   LV E/e' medial:  15.5 LV IVS:        1.10 cm   LV e' lateral:   7.40 cm/s LVOT diam:     2.00 cm   LV E/e' lateral: 13.9 LV SV:         87 LV SV Index:   50 LVOT Area:     3.14 cm  RIGHT VENTRICLE RV S prime:     13.30 cm/s TAPSE (M-mode): 2.2 cm LEFT ATRIUM             Index        RIGHT ATRIUM           Index LA diam:        3.60 cm 2.05 cm/m   RA Area:     19.70 cm LA Vol (A2C):   89.1 ml 50.84 ml/m  RA Volume:   66.10 ml  37.71 ml/m LA Vol (A4C):   62.0 ml 35.37 ml/m LA Biplane Vol: 74.0 ml 42.22 ml/m  AORTIC VALVE AV Area (Vmax):    2.36 cm AV Area (Vmean):   2.35 cm AV Area (VTI):     2.39 cm AV Vmax:           200.00 cm/s AV Vmean:          131.000 cm/s AV VTI:            0.365 m AV Peak Grad:      16.0 mmHg AV Mean Grad:      8.0 mmHg LVOT Vmax:         150.00 cm/s LVOT Vmean:        97.900 cm/s LVOT VTI:          0.278 m LVOT/AV VTI ratio: 0.76  AORTA Ao Root diam: 3.50 cm MITRAL VALVE MV Area (PHT): 2.95 cm     SHUNTS MV Decel Time: 257 msec     Systemic VTI:  0.28 m MV E velocity: 103.00 cm/s  Systemic Diam:  2.00 cm MV A velocity: 130.00 cm/s MV E/A ratio:  0.79 Dina Rich MD Electronically signed by Dina Rich MD Signature Date/Time: 10/24/2023/4:10:59 PM    Final    DG Chest 2 View Result Date: 10/23/2023 CLINICAL DATA:  Shortness of breath and hypoxia. EXAM: CHEST - 2 VIEW COMPARISON:  03/18/2023 FINDINGS: The heart size and mediastinal contours are within normal limits. Low bilateral lung volumes. Probable small left pleural effusion. No overt pulmonary edema. Mild pulmonary interstitial edema cannot be excluded. The visualized skeletal structures are unremarkable. IMPRESSION: Low bilateral lung volumes. Probable small left pleural effusion. Mild pulmonary interstitial edema cannot be excluded. Electronically Signed   By: Irish Lack M.D.   On: 10/23/2023 12:50   MR LUMBAR SPINE WO CONTRAST Result Date:  10/08/2023 CLINICAL DATA:  Wedge compression fracture of second lumbar vertebra. EXAM: MRI LUMBAR SPINE WITHOUT CONTRAST TECHNIQUE: Multiplanar, multisequence MR imaging of the lumbar spine was performed. No intravenous contrast was administered. COMPARISON:  08/06/2022 FINDINGS: Segmentation:  5 lumbar type vertebrae Alignment:  Mild scoliosis Vertebrae: Essentially resolved marrow edema at the level of L2 compression fracture seen on prior. Height loss did progress eccentric towards the left, over 50% at this level where there is also mild retropulsion. There is now marrow edema and compression deformity at the T11 and L1 vertebral bodies with a horizontal hypointense fracture plane best seen at L1. No evidence of aggressive bone lesion. Conus medullaris and cauda equina: Conus extends to the T12-L1 level. Conus and cauda equina appear normal. Paraspinal and other soft tissues: Renal hilar and renal cortical cysts. No follow-up imaging recommended Disc levels: T12- L1: Disc space narrowing and circumferential bulging. Negative facets. L1-L2: Disc space narrowing and bulging. Degenerative facet spurring asymmetric to the left. Mild left foraminal narrowing L2-L3: Disc narrowing and bulging.  Negative facets. L3-L4: Disc narrowing and bulging.  Mild facet spurring. L4-L5: Disc collapse with endplate ridging and facet spurring centric to the right. Right foraminal protrusion and foraminal impingement to a moderate degree on sagittal images. L5-S1:Moderate degenerative facet spurring with mild anterolisthesis. No herniation or impingement. IMPRESSION: 1. Compression fractures at T11 and L1 which are acute or subacute. Height loss is mild at both levels and there is no retropulsion. 2. Interval healing of L2 compression fracture seen on 2023 comparison. Height loss progressed in the left vertebral body since prior. 3. Degenerative foraminal impingement on the right at L4-5 Electronically Signed   By: Tiburcio Pea  M.D.   On: 10/08/2023 09:48    Microbiology: Results for orders placed or performed during the hospital encounter of 10/23/23  Resp panel by RT-PCR (RSV, Flu A&B, Covid) Anterior Nasal Swab     Status: None   Collection Time: 10/23/23 12:19 PM   Specimen: Anterior Nasal Swab  Result Value Ref Range Status   SARS Coronavirus 2 by RT PCR NEGATIVE NEGATIVE Final    Comment: (NOTE) SARS-CoV-2 target nucleic acids are NOT DETECTED.  The SARS-CoV-2 RNA is generally detectable in upper respiratory specimens during the acute phase of infection. The lowest concentration of SARS-CoV-2 viral copies this assay can detect is 138 copies/mL. A negative result does not preclude SARS-Cov-2 infection and should not be used as the sole basis for treatment or other patient management decisions. A negative result may occur with  improper specimen collection/handling, submission of specimen other than nasopharyngeal swab, presence of viral mutation(s) within the areas targeted by this assay, and inadequate number of viral copies(<138 copies/mL). A negative result must  be combined with clinical observations, patient history, and epidemiological information. The expected result is Negative.  Fact Sheet for Patients:  BloggerCourse.com  Fact Sheet for Healthcare Providers:  SeriousBroker.it  This test is no t yet approved or cleared by the [place] FDA and  has been authorized for detection and/or diagnosis of SARS-CoV-2 by FDA under an Emergency Use Authorization (EUA). This EUA will remain  in effect (meaning this test can be used) for the duration of the COVID-19 declaration under Section 564(b)(1) of the Act, 21 U.S.C.section 360bbb-3(b)(1), unless the authorization is terminated  or revoked sooner.       Influenza A by PCR NEGATIVE NEGATIVE Final   Influenza B by PCR NEGATIVE NEGATIVE Final    Comment: (NOTE) The Xpert Xpress  SARS-CoV-2/FLU/RSV plus assay is intended as an aid in the diagnosis of influenza from Nasopharyngeal swab specimens and should not be used as a sole basis for treatment. Nasal washings and aspirates are unacceptable for Xpert Xpress SARS-CoV-2/FLU/RSV testing.  Fact Sheet for Patients: BloggerCourse.com  Fact Sheet for Healthcare Providers: SeriousBroker.it  This test is not yet approved or cleared by the [place] FDA and has been authorized for detection and/or diagnosis of SARS-CoV-2 by FDA under an Emergency Use Authorization (EUA). This EUA will remain in effect (meaning this test can be used) for the duration of the COVID-19 declaration under Section 564(b)(1) of the Act, 21 U.S.C. section 360bbb-3(b)(1), unless the authorization is terminated or revoked.     Resp Syncytial Virus by PCR NEGATIVE NEGATIVE Final    Comment: (NOTE) Fact Sheet for Patients: BloggerCourse.com  Fact Sheet for Healthcare Providers: SeriousBroker.it  This test is not yet approved or cleared by the [place] FDA and has been authorized for detection and/or diagnosis of SARS-CoV-2 by FDA under an Emergency Use Authorization (EUA). This EUA will remain in effect (meaning this test can be used) for the duration of the COVID-19 declaration under Section 564(b)(1) of the Act, 21 U.S.C. section 360bbb-3(b)(1), unless the authorization is terminated or revoked.  Performed at [facility], [address] [place], [place] [address]     Labs: CBC: Recent Labs  Lab 10/23/23 1228 10/24/23 0431  WBC 9.6 9.5  HGB 14.1 13.2  HCT 46.6* 45.3  MCV 96.9 97.0  PLT 203 201   Basic Metabolic Panel: Recent Labs  Lab 10/23/23 1228 10/23/23 1700 10/24/23 0431 10/25/23 0413  NA 140  --  141 139  K 4.2  --  3.7 3.6  CL 99  --  100 96*  CO2 31  --  34* 34*  GLUCOSE 99  --  114* 118*  BUN 15  --   15 17  CREATININE 0.45  --  0.46 0.43*  CALCIUM 9.0  --  8.3* 8.3*  MG  --  1.9  --   --   PHOS  --  3.6  --   --    Liver Function Tests: Recent Labs  Lab 10/23/23 1228  AST 15  ALT 15  ALKPHOS 82  BILITOT 0.5  PROT 7.3  ALBUMIN 3.9   CBG: Recent Labs  Lab 10/24/23 1110 10/24/23 1641 10/24/23 2128 10/25/23 0727 10/25/23 1115  GLUCAP 215* 141* 168* 101* 132*    Discharge time spent: greater than 30 minutes.  Signed: Vassie Loll, MD [facility] 10/25/2023

## 2023-10-25 NOTE — TOC Transition Note (Signed)
Transition of Care [facility]) - Discharge Note   Patient Details  Name: Kristina [name] MRN: [id number] Date of Birth: 1950/11/01  [facility] CM/SW Contact:  Leitha Bleak, RN Phone Number: 10/25/2023, 2:06 PM   Clinical Narrative:   Patient discharging home. CM at the bedside. Home oxygen ordered, CMS choices given. Patient states she has a portable tank at home with batteries to use as needed. She has a pulse ox and monitors her oxygen. She will continue to use that. Looks like we referred to [facility] a year ago.   CHF consult completed. Patient states she weight herself daily and does not need home health RN at this time. We discussed getting more education for CHF. She is agreeable to resources. CHF education added to AVS. Patient is discharging home.    Final next level of care: Home/Self Care Barriers to Discharge: Barriers Resolved  Patient Goals and CMS Choice Patient states their goals for this hospitalization and ongoing recovery are:: to go home CMS Medicare.gov Compare Post Acute Care list provided to:: Patient Choice offered to / list presented to : Patient     Discharge Placement                  Name of family member notified: husband Patient and family notified of of transfer: 10/25/23  Discharge Plan and Services Additional resources added to the After Visit Summary for        Social Drivers of Health (SDOH) Interventions SDOH Screenings   Food Insecurity: No Food Insecurity (10/23/2023)  Housing: Low Risk  (10/23/2023)  Transportation Needs: No Transportation Needs (10/23/2023)  Utilities: Not At Risk (10/23/2023)  Depression (PHQ2-9): Low Risk  (04/20/2023)  Financial Resource Strain: Low Risk  (04/20/2023)  Physical Activity: Insufficiently Active (07/09/2023)   Received from [facility]  Social Connections: Socially Integrated (10/23/2023)  Tobacco Use: Low Risk  (10/23/2023)  Health Literacy: Adequate Health Literacy (04/20/2023)     Readmission Risk Interventions    10/24/2023    1:09 PM  Readmission Risk Prevention Plan  Transportation Screening Complete  PCP or Specialist Appt within 5-7 Days Not Complete  Home Care Screening Complete  Medication Review (RN CM) Complete

## 2023-10-25 NOTE — Progress Notes (Signed)
Changed patient's CPAP mask to size large per her request.

## 2023-10-26 ENCOUNTER — Telehealth: Payer: Self-pay

## 2023-10-26 NOTE — Transitions of Care (Post Inpatient/ED Visit) (Signed)
10/26/2023  Name: Kristina [name] MRN: [id number] DOB: 07-25-1951  Today's TOC FU Call Status: Today's TOC FU Call Status:: Successful TOC FU Call Completed TOC FU Call Complete Date: 10/26/23 Patient's Name and Date of Birth confirmed.  Transition Care Management Follow-up Telephone Call Date of Discharge: 10/25/23 [facility] Type of Discharge: Inpatient Admission Primary Inpatient Discharge Diagnosis:: Acute exacerbation of congestive heart failure ( How have you been since you were released from the hospital?: Better Any questions or concerns?: No  Items Reviewed: Did you receive and understand the discharge instructions provided?: Yes Medications obtained,verified, and reconciled?: Yes (Medications Reviewed) Any new allergies since your discharge?: No Dietary orders reviewed?: Yes Type of Diet Ordered:: Reg Heart Healthy Do you have support at home?: Yes People in Home: spouse, child(ren), adult Name of Support/Comfort Primary Source: Lyda Jester- Husband, Kathy-Daughter  Medications Reviewed Today: Medications Reviewed Today     Reviewed by Johnnette Barrios, RN (Registered Nurse) on 10/26/23 at 1346  Med List Status: <None>   Medication Order Taking? Sig Documenting Provider Last Dose Status Informant  acetaminophen (TYLENOL) 325 MG tablet [id number] Yes Take 2 tablets (650 mg total) by mouth every 6 (six) hours as needed for mild pain (or Fever >/= 101). Shon Hale, MD Taking Active Self  amLODipine (NORVASC) 10 MG tablet [id number] Yes Take 1 tablet (10 mg total) by mouth daily. Shon Hale, MD Taking Active Self  carvedilol (COREG) 6.25 MG tablet [id number] Yes TAKE 1 TABLET TWICE A DAY Branch, Dorothe Pea, MD Taking Active Self  denosumab (PROLIA) 60 MG/ML SOSY injection [id number] Yes INJECT 60 MG INTO THE SKIN EVERY 6 MONTHS. [provider] Taking Active Self  docusate sodium (COLACE) 100 MG capsule [id number] Yes Take 1 capsule (100  mg total) by mouth 2 (two) times daily. Sherryll Burger, Pratik D, DO Taking Active Self  empagliflozin (JARDIANCE) 10 MG TABS tablet [id number] Yes Take 1 tablet (10 mg total) by mouth daily before breakfast. Antoine Poche, MD Taking Active Self  furosemide (LASIX) 40 MG tablet [id number] Yes Take 1 tablet (40 mg total) by mouth daily. Shon Hale, MD Taking Active Self  HYDROcodone-acetaminophen (NORCO/VICODIN) 5-325 MG tablet [id number] Yes Take 1-2 tablets by mouth every 6 (six) hours as needed for moderate pain or severe pain. Maurilio Lovely D, DO Taking Active Self  LINZESS 145 MCG CAPS capsule [id number] Yes Take 145 mcg by mouth daily. [provider] Taking Active Self  losartan (COZAAR) 100 MG tablet [id number] Yes Take 1 tablet (100 mg total) by mouth daily. Shon Hale, MD Taking Active Self  methocarbamol (ROBAXIN) 500 MG tablet [id number] Yes Take 1 tablet (500 mg total) by mouth every 6 (six) hours as needed for muscle spasms. Maurilio Lovely D, DO Taking Active Self  Multiple Vitamin (MULTIVITAMIN) tablet [id number] Yes Take 1 tablet by mouth daily. [provider] Taking Active Self  omeprazole (PRILOSEC) 40 MG capsule [id number] Yes Take 40 mg by mouth daily. [provider] Taking Active Self  rosuvastatin (CRESTOR) 10 MG tablet [id number] Yes Take 10 mg by mouth daily. [provider] Taking Active Self           Med Note Lenor Derrick   Wed Mar 14, 2023  8:04 PM)    VASCEPA 1 g capsule [id number] Yes Take 2 g by mouth 2 (two) times daily. [provider] Taking Active Self           Medication reconciliation / review completed based on  most recent discharge summary and EHR medication list. Confirmed patient is taking all newly prescribed medications as instructed (any discrepancies are noted in review section)   Patient / Caregiver is aware of any changes to and / or  any dosage adjustments to medication regimen. Patient/ Caregiver denies  questions at this time and reports no barriers to medication adherence.  Home Care and Equipment/Supplies: Were Home Health Services Ordered?: No Any new equipment or medical supplies ordered?: No  Functional Questionnaire: Do you need assistance with bathing/showering or dressing?: No Do you need assistance with meal preparation?: No Do you need assistance with eating?: No Do you have difficulty maintaining continence: No Do you need assistance with getting out of bed/getting out of a chair/moving?: No Do you have difficulty managing or taking your medications?: No  Follow up appointments reviewed: PCP Follow-up appointment confirmed?: No (Patient will schedule) MD Provider Line [phone] Given: No Specialist Hospital Follow-up appointment confirmed?: NA Do you need transportation to your follow-up appointment?: No (She drives and spouse can transport) Do you understand care options if your condition(s) worsen?: Yes-patient verbalized understanding  SDOH Interventions Today    Flowsheet Row Most Recent Value  SDOH Interventions   Food Insecurity Interventions Intervention Not Indicated  Housing Interventions Intervention Not Indicated  Transportation Interventions Intervention Not Indicated, Patient Resources (Friends/Family)  Utilities Interventions Intervention Not Indicated  Social Connections Interventions Intervention Not Indicated      Interventions Today    Flowsheet Row Most Recent Value  Chronic Disease   Chronic disease during today's visit Congestive Heart Failure (CHF)  General Interventions   General Interventions Discussed/Reviewed General Interventions Discussed, Durable Medical Equipment (DME), Doctor Visits  Doctor Visits Discussed/Reviewed Doctor Visits Discussed, Doctor Visits Reviewed, PCP  Durable Medical Equipment (DME) BP Cuff, Oxygen, Wheelchair  Wheelchair Standard  PCP/Specialist Visits Compliance with follow-up visit  Exercise  Interventions   Exercise Discussed/Reviewed Exercise Discussed, Exercise Reviewed  Physical Activity Discussed/Reviewed Physical Activity Discussed  Education Interventions   Provided Verbal Education On Nutrition  Nutrition Interventions   Nutrition Discussed/Reviewed Nutrition Reviewed  Pharmacy Interventions   Pharmacy Dicussed/Reviewed Medications and their functions  Safety Interventions   Safety Discussed/Reviewed Safety Discussed, Safety Reviewed  Home Safety Assistive Devices       Benefits reviewed  Based on current information and Insurance plan -Reviewed benefits accessible to patient, including details about eligibility options for care and  available value based care options  if any areas of needs were identified.  Reviewed patient/  caregiver's ability to access and / or  ability with navigating the benefits system..Amb Referral made if indicted , refer to orders section of note for details   Reviewed goals for care Patient/ Caregiver  verbalizes understanding of instructions and care plan provided. Patient / Caregiver was encouraged to make informed decisions about their care, actively participate in managing their health condition, and implement lifestyle changes as needed to promote independence and self-management of health care. There were no reported  barriers to care.   TOC program  Patient is at high risk for readmission and/or has history of  high utilization  Discussed [facility]  TOC program and weekly calls to patient to assess condition/status, medication management  and provide support/education as indicated . Patient/ Caregiver voiced understanding and declined enrollment in the 30-day [facility] Program.    She is currently followed by the [facility] Longitudinal Case Management team with appointments scheduled with RN 2/7 and 11/22/23    The patient has been provided with contact  information for the care management team and has been advised to call with any health-related  questions or concerns. Follow up as indicated with Care Team , or sooner should any new problems arise.      Susa Loffler , BSN, RN [facility]   [facility]-Population Health RN Care Manager Direct Dial [phone]  Fax: [phone] Website: [email or web address]

## 2023-10-29 DIAGNOSIS — I509 Heart failure, unspecified: Secondary | ICD-10-CM | POA: Diagnosis not present

## 2023-10-29 DIAGNOSIS — G4733 Obstructive sleep apnea (adult) (pediatric): Secondary | ICD-10-CM | POA: Diagnosis not present

## 2023-11-02 ENCOUNTER — Ambulatory Visit: Payer: Self-pay | Admitting: *Deleted

## 2023-11-02 NOTE — Patient Outreach (Signed)
  Care Coordination   11/02/2023 Name: Kristina [name] MRN: [id number] DOB: 05-04-1951   Care Coordination Outreach Attempts:  An unsuccessful outreach was attempted for an appointment today.  Follow Up Plan:  Additional outreach attempts will be made to offer the patient complex care management information and services.   Encounter Outcome:  No Answer   Care Coordination Interventions:  No, not indicated    Kellsey Sansone L. Ramonita, RN, BSN, CCM [place]  [facility], [facility] RN Care Manager Direct Dial: [phone]  Fax: [phone] Mailing Address: [address]  [place] [address] Website: [email or web address]

## 2023-11-06 ENCOUNTER — Ambulatory Visit: Payer: Self-pay | Admitting: *Deleted

## 2023-11-06 NOTE — Patient Outreach (Addendum)
  Care Coordination   11/06/2023 Name: Kristina [name] MRN: [id number] DOB: August 25, 1951   Care Coordination Outreach Attempts:  A second unsuccessful outreach was attempted today to offer the patient with information about available complex care management services.  Follow Up Plan:  Additional outreach attempts will be made to offer the patient complex care management information and services.   Encounter Outcome:  No Answer   Care Coordination Interventions:  No, not indicated    Ginia Rudell L. Noelle Penner, RN, BSN, CCM [place]  [facility], [facility] RN Care Manager Direct Dial: [phone]  Fax: [phone] Mailing Address: [address]  [place] [address] Website: [email or web address]

## 2023-11-09 ENCOUNTER — Other Ambulatory Visit: Payer: Self-pay | Admitting: Neurosurgery

## 2023-11-12 ENCOUNTER — Other Ambulatory Visit: Payer: Self-pay

## 2023-11-12 ENCOUNTER — Encounter (HOSPITAL_COMMUNITY): Payer: Self-pay | Admitting: Vascular Surgery

## 2023-11-12 DIAGNOSIS — R2689 Other abnormalities of gait and mobility: Secondary | ICD-10-CM | POA: Diagnosis not present

## 2023-11-12 DIAGNOSIS — S32402D Unspecified fracture of left acetabulum, subsequent encounter for fracture with routine healing: Secondary | ICD-10-CM | POA: Diagnosis not present

## 2023-11-12 NOTE — Anesthesia Preprocedure Evaluation (Signed)
 Anesthesia Evaluation  Patient identified by MRN, date of birth, ID band Patient awake    Reviewed: Allergy & Precautions, NPO status , Patient's Chart, lab work & pertinent test results  Airway Mallampati: II  TM Distance: >3 FB Neck ROM: Full    Dental no notable dental hx. (+) Teeth Intact, Dental Advisory Given   Pulmonary neg pulmonary ROS   Pulmonary exam normal breath sounds clear to auscultation       Cardiovascular hypertension, Pt. on medications and Pt. on home beta blockers + Peripheral Vascular Disease  + Valvular Problems/Murmurs AS  Rhythm:Regular Rate:Normal + Systolic murmurs Echo 10/17/2023: IMPRESSIONS   1. Left ventricular ejection fraction, by estimation, is 60 to 65%. The  left ventricle has normal function. The left ventricle has no regional  wall motion abnormalities. There is mild left ventricular hypertrophy.  Left ventricular diastolic parameters  are consistent with Grade I diastolic dysfunction (impaired relaxation).  Elevated left ventricular end-diastolic pressure.   2. Right ventricular systolic function is normal. The right ventricular  size is normal.   3. Left atrial size was moderately dilated.   4. The mitral valve is abnormal. Trivial mitral valve regurgitation.   5. The aortic valve is calcified. There is moderate calcification of the  aortic valve. Aortic valve regurgitation is not visualized. Moderate to  severe aortic valve stenosis. Aortic valve area, by VTI measures 0.69 cm.  Aortic valve mean gradient  measures 31.0 mmHg. Aortic valve Vmax measures 3.74 m/s. Peak gradient 56  mmHg, DI 0.24.   6. The inferior vena cava is normal in size with greater than 50%  respiratory variability, suggesting right atrial pressure of 3 mmHg.  - Comparison(s): Changes from prior study are noted. 08/25/2022: LVEF 70-75%,  moderate AS - mean gradient 32 mmHg, DI is 0.34.       Neuro/Psych    Anxiety Depression  Schizophrenia  negative neurological ROS     GI/Hepatic Neg liver ROS,GERD  Medicated,,  Endo/Other  diabetes, Type 2, Oral Hypoglycemic Agents    Renal/GU CRFRenal disease  negative genitourinary   Musculoskeletal   Abdominal Normal abdominal exam  (+)   Peds  Hematology  (+) Blood dyscrasia, anemia Lab Results      Component                Value               Date                      WBC                      5.8                 09/14/2023                HGB                      10.4 (L)            09/14/2023                HCT                      30.7 (L)            09/14/2023                [facility]

## 2023-11-12 NOTE — Progress Notes (Signed)
 SDW CALL  Patient was given pre-op instructions over the phone. The opportunity was given for the patient to ask questions. No further questions asked. Patient verbalized understanding of instructions given.   PCP - Dr. Catalina Pizza Cardiologist - Dr. Dina Rich  PPM/ICD - denies Device Orders - n/a Rep Notified - n/a  Chest x-ray - 10/23/23 EKG - 10/23/23 Stress Test -  ECHO - 10/24/23 Cardiac Cath -   Sleep Study - OSA+ and wears CPAP at night - settings are at 4   Patient states that she does not check her blood sugar at home  Patient's last dose of Jardiance was 2/17 - patient instructed not to take it on 2/18  Blood Thinner Instructions: n/a Aspirin Instructions: n/a  ERAS Protcol - NPO - per surgeon's instructions given to patient   COVID TEST- n/a   Anesthesia review: yes  Patient denies shortness of breath, fever, cough and chest pain over the phone call   All instructions explained to the patient, with a verbal understanding of the material. Patient agrees to go over the instructions while at home for a better understanding.

## 2023-11-12 NOTE — Progress Notes (Signed)
 Anesthesia Chart Review: SAME DAY WORK-UP  Case: [id number] Date/Time: 11/13/23 1045   Procedure: - T11 - L1 kyphoplasty (Kyphon)   Anesthesia type: General   Pre-op diagnosis: Other osteoporosis with current pathological fracture, vertebra e, initial   [facility] / [facility]   Surgeons: Bedelia Person, MD       DISCUSSION: Patient is a 73 year old female scheduled for the above procedure.  History includes never smoker, chronic combined systolic and diastolic CHF (LVEF 45-50% in 09/2019, > 55% since 03/2021), HTN, DM2, HLD, GERD.  Her primary cardiologist is Dr. Dina Rich. Last office visit 07/19/23 for chronic diastolic HF, HTN, HLD follow-up. Last admission at that time for volume overload was in July 2022. She was compliant with Lasix. Known chronic SOB/DOE with restrictive lung disease due to obesity with home O2 with exertion and with sleep. On CPAP for OSA. Lipids followed by PCP.  Advised home monitoring of BP. Jardiance added for HFrEF. Continue Lasix follow-up six month planned.   More recently admitted to [facility] 10/23/23 - 10/25/23 with dyspnea. No sick symptoms like sore throat, new cough, fever or chills. No new leg swelling. She reported medication compliance, but was not using home oxygen regularly. She noted her home O2 sat in the 70's. She was placed on 4L O2 in the ED with improvement to 98%. "Labs showed normal BNP of 70, D-dimer, troponin, CBC, CMP, Phos and mag. Negative flu, RSV and COVID test. EKG shows sinus tach. Chest x-ray shows possible mild pulmonary interstitial edema. Patient received IV Lasix 40 mg x 1 and DuoNebs. TRH was consulted for admission." Echo updated on 10/24/23 and showed LVEF 65-70%, no regional wall abnormalities, mild LVH, grade 1 diastolic dysfunction, elevated LA pressure, normal RV systolic function. Echo results felt stable and reassuring. By discharge O2 sat on RA 90%, but down to 86% during ambulation. 93% on 2L O2 so recommended continue  2L O2 at discharge. Continue meds of carvedilol, Jardiance, Cozaar, Lasix 40 mg daily, Crestor. She is also on amlodipine and Vascepa. CPAP compliance encouraged. Appears she had primary care follow-up with Lupita Raider, DNP on 10/29/23--office note requested but is pending.   Last office visit with pulmonology was on 07/24/23 with Ames Dura, NP for OSA follow-up. "HST on 07/20/21 showed moderate-severe OSA with severe oxygen desaturation; AHI 29.6/hr with SpO2 low 38% with an average of 75%."  She was using CPAP but not always compliant about putting it back on if she woke up to use the restroom. Had nocturnal O2. Pressure then 5-20 cm H2O, residual AH! 1.0/hour. She recommended adjusting CPAP to set pressure 9cm H2O to see if this helped with tolerance. Six month follow-up planned.    She is a same day work-up. Anesthesia team to evaluate on the day of surgery. 10/29/23 PCP note has been requested.   VS:  Wt Readings from Last 3 Encounters:  10/25/23 83.3 kg  07/24/23 83.1 kg  07/19/23 84.7 kg   BP Readings from Last 3 Encounters:  10/25/23 112/70  07/24/23 (!) 172/84  07/19/23 (!) 150/90   Pulse Readings from Last 3 Encounters:  10/25/23 95  07/24/23 84  07/19/23 85     PROVIDERS: Benita Stabile, MD is PCP  Dina Rich, MD is cardiologist Coralyn Helling, MD is pulmonologist   LABS: Most recent lab results in [facility] include: Lab Results  Component Value Date   WBC 9.5 10/24/2023   HGB 13.2 10/24/2023   HCT 45.3 10/24/2023  PLT 201 10/24/2023   GLUCOSE 118 (H) 10/25/2023   CHOL 148 10/23/2023   TRIG 169 (H) 10/23/2023   HDL 57 10/23/2023   LDLCALC 57 10/23/2023   ALT 15 10/23/2023   AST 15 10/23/2023   NA 139 10/25/2023   K 3.6 10/25/2023   CL 96 (L) 10/25/2023   CREATININE 0.43 (L) 10/25/2023   BUN 17 10/25/2023   CO2 34 (H) 10/25/2023   TSH 0.297 (L) 10/23/2023   INR 0.9 03/14/2023   HGBA1C 6.2 (H) 10/23/2023    OTHER: Home Sleep Study 07/20/21: Per  pulmonology notes, "HST on 07/20/21 showed moderate-severe OSA with severe oxygen desaturation; AHI 29.6/hr with SpO2 low 38% with an average of 75%."   PFTs 06/22/21: "FEV1 1.25 (71%), FEV1% 80, TLC 2.61 (62%), DLCO 105%"   IMAGES: CXR 10/23/23: MPRESSION: Low bilateral lung volumes. Probable small left pleural effusion. Mild pulmonary interstitial edema cannot be excluded.  MRI L-spine 09/28/23: IMPRESSION: 1. Compression fractures at T11 and L1 which are acute or subacute. Height loss is mild at both levels and there is no retropulsion. 2. Interval healing of L2 compression fracture seen on 2023 comparison. Height loss progressed in the left vertebral body since prior. 3. Degenerative foraminal impingement on the right at L4-5    EKG: 10/23/2023: Sinus tach at 107 bpm   CV: Echo 10/24/23: IMPRESSIONS   1. Left ventricular ejection fraction, by estimation, is 65 to 70%. The  left ventricle has normal function. The left ventricle has no regional  wall motion abnormalities. There is mild left ventricular hypertrophy.  Left ventricular diastolic parameters  are consistent with Grade I diastolic dysfunction (impaired relaxation).  Elevated left atrial pressure.   2. Right ventricular systolic function is normal. The right ventricular  size is normal. Tricuspid regurgitation signal is inadequate for assessing  PA pressure.   3. Left atrial size was mild to moderately dilated.   4. Right atrial size was mildly dilated.   5. The mitral valve is normal in structure. No evidence of mitral valve  regurgitation. No evidence of mitral stenosis.   6. The aortic valve is tricuspid. There is mild calcification of the  aortic valve. There is mild thickening of the aortic valve. Aortic valve  regurgitation is not visualized. No aortic stenosis is present.   7. The inferior vena cava is dilated in size with >50% respiratory  variability, suggesting right atrial pressure of 8 mmHg.  -  Comparison 03/15/23 LVEF 60-65%, grade 1 DD, normal RVSF; 04/06/21 LVEF 60-65%; 10/03/19 LVEF 45-50%   Past Medical History:  Diagnosis Date   Acute respiratory distress syndrome (ARDS) due to COVID-19 virus (HCC) 09/2019   Chronic combined systolic and diastolic CHF (congestive heart failure) (HCC)    Diabetes mellitus, type 2 (HCC)    GERD (gastroesophageal reflux disease)    Hyperlipidemia    Hypertension     Past Surgical History:  Procedure Laterality Date   COLONOSCOPY N/A 12/21/2016   Procedure: COLONOSCOPY;  Surgeon: Malissa Hippo, MD;  Location: [facility]O SUITE;  Service: Endoscopy;  Laterality: N/A;  930   NO PAST SURGERIES     ORIF ACETABULAR FRACTURE Left 03/16/2023   Procedure: OPEN REDUCTION INTERNAL FIXATION (ORIF) ACETABULAR FRACTURE STOPPA APPROACH;  Surgeon: Roby Lofts, MD;  Location: [facility] OR;  Service: Orthopedics;  Laterality: Left;    MEDICATIONS: No current facility-administered medications for this encounter.    acetaminophen (TYLENOL) 325 MG tablet   amLODipine (NORVASC) 10 MG tablet  carvedilol (COREG) 6.25 MG tablet   denosumab (PROLIA) 60 MG/ML SOSY injection   docusate sodium (COLACE) 100 MG capsule   empagliflozin (JARDIANCE) 10 MG TABS tablet   furosemide (LASIX) 40 MG tablet   HYDROcodone-acetaminophen (NORCO/VICODIN) 5-325 MG tablet   LINZESS 145 MCG CAPS capsule   losartan (COZAAR) 100 MG tablet   methocarbamol (ROBAXIN) 500 MG tablet   Multiple Vitamin (MULTIVITAMIN) tablet   omeprazole (PRILOSEC) 40 MG capsule   rosuvastatin (CRESTOR) 10 MG tablet   VASCEPA 1 g capsule    Shonna Chock, PA-C Surgical Short Stay/Anesthesiology [facility] Phone [phone] [facility] Phone [phone] 11/12/2023 4:17 PM

## 2023-11-13 ENCOUNTER — Ambulatory Visit (HOSPITAL_COMMUNITY)
Admission: RE | Admit: 2023-11-13 | Discharge: 2023-11-13 | Disposition: A | Discharge status: Home / Self Care | Payer: Medicare HMO | Source: Ambulatory Visit | Attending: Neurosurgery | Admitting: Neurosurgery

## 2023-11-13 ENCOUNTER — Encounter (HOSPITAL_COMMUNITY)
Admission: RE | Disposition: A | Discharge status: Home / Self Care | Payer: Self-pay | Source: Ambulatory Visit | Attending: Neurosurgery

## 2023-11-13 ENCOUNTER — Ambulatory Visit (HOSPITAL_BASED_OUTPATIENT_CLINIC_OR_DEPARTMENT_OTHER): Payer: Medicare HMO | Admitting: Vascular Surgery

## 2023-11-13 ENCOUNTER — Ambulatory Visit (HOSPITAL_COMMUNITY): Payer: Medicare HMO

## 2023-11-13 ENCOUNTER — Other Ambulatory Visit: Payer: Self-pay

## 2023-11-13 ENCOUNTER — Encounter (HOSPITAL_COMMUNITY): Payer: Self-pay | Admitting: *Deleted

## 2023-11-13 ENCOUNTER — Ambulatory Visit (HOSPITAL_COMMUNITY): Payer: Self-pay | Admitting: Vascular Surgery

## 2023-11-13 DIAGNOSIS — M8008XA Age-related osteoporosis with current pathological fracture, vertebra(e), initial encounter for fracture: Secondary | ICD-10-CM | POA: Diagnosis not present

## 2023-11-13 DIAGNOSIS — E119 Type 2 diabetes mellitus without complications: Secondary | ICD-10-CM

## 2023-11-13 DIAGNOSIS — G4733 Obstructive sleep apnea (adult) (pediatric): Secondary | ICD-10-CM | POA: Insufficient documentation

## 2023-11-13 DIAGNOSIS — Z7984 Long term (current) use of oral hypoglycemic drugs: Secondary | ICD-10-CM | POA: Insufficient documentation

## 2023-11-13 DIAGNOSIS — Z0189 Encounter for other specified special examinations: Secondary | ICD-10-CM | POA: Diagnosis not present

## 2023-11-13 DIAGNOSIS — M8088XA Other osteoporosis with current pathological fracture, vertebra(e), initial encounter for fracture: Secondary | ICD-10-CM | POA: Diagnosis not present

## 2023-11-13 DIAGNOSIS — K219 Gastro-esophageal reflux disease without esophagitis: Secondary | ICD-10-CM | POA: Diagnosis not present

## 2023-11-13 DIAGNOSIS — I5042 Chronic combined systolic (congestive) and diastolic (congestive) heart failure: Secondary | ICD-10-CM | POA: Diagnosis not present

## 2023-11-13 DIAGNOSIS — I5022 Chronic systolic (congestive) heart failure: Secondary | ICD-10-CM | POA: Diagnosis not present

## 2023-11-13 DIAGNOSIS — I110 Hypertensive heart disease with heart failure: Secondary | ICD-10-CM | POA: Insufficient documentation

## 2023-11-13 DIAGNOSIS — M8448XA Pathological fracture, other site, initial encounter for fracture: Secondary | ICD-10-CM | POA: Diagnosis not present

## 2023-11-13 HISTORY — DX: Sleep apnea, unspecified: G47.30

## 2023-11-13 HISTORY — DX: Dyspnea, unspecified: R06.00

## 2023-11-13 HISTORY — PX: KYPHOPLASTY: SHX5884

## 2023-11-13 LAB — SURGICAL PCR SCREEN
MRSA, PCR: NEGATIVE
Staphylococcus aureus: NEGATIVE

## 2023-11-13 LAB — GLUCOSE, CAPILLARY
Glucose-Capillary: 114 mg/dL — ABNORMAL HIGH (ref 70–99)
Glucose-Capillary: 99 mg/dL (ref 70–99)
Glucose-Capillary: 136 mg/dL — ABNORMAL HIGH (ref 70–99)

## 2023-11-13 SURGERY — KYPHOPLASTY
Anesthesia: General

## 2023-11-13 MED ORDER — FENTANYL CITRATE (PF) 100 MCG/2ML IJ SOLN: 25.0000 ug | INTRAMUSCULAR | Status: DC | PRN

## 2023-11-13 MED ORDER — SODIUM CHLORIDE 0.9 % IV SOLN: INTRAVENOUS | Status: DC | PRN

## 2023-11-13 MED ORDER — CEFAZOLIN SODIUM-DEXTROSE 2-3 GM-%(50ML) IV SOLR
INTRAVENOUS | Status: DC | PRN
  Administered 2023-11-13: 2 g via INTRAVENOUS

## 2023-11-13 MED ORDER — LACTATED RINGERS IV SOLN: INTRAVENOUS | Status: DC

## 2023-11-13 MED ORDER — BUPIVACAINE HCL (PF) 0.5 % IJ SOLN
INTRAMUSCULAR | Status: DC | PRN
  Administered 2023-11-13: 10 mL

## 2023-11-13 MED ORDER — INSULIN ASPART 100 UNIT/ML IJ SOLN: 0.0000 [IU] | INTRAMUSCULAR | Status: DC | PRN

## 2023-11-13 MED ORDER — LIDOCAINE-EPINEPHRINE 1 %-1:100000 IJ SOLN
INTRAMUSCULAR | Status: DC | PRN
  Administered 2023-11-13 (×2): 10 mL

## 2023-11-13 MED ORDER — BUPIVACAINE HCL (PF) 0.5 % IJ SOLN
INTRAMUSCULAR | Status: AC
  Filled 2023-11-13: qty 30

## 2023-11-13 MED ORDER — SUGAMMADEX SODIUM 200 MG/2ML IV SOLN
INTRAVENOUS | Status: DC | PRN
  Administered 2023-11-13: 250 mg via INTRAVENOUS

## 2023-11-13 MED ORDER — ROCURONIUM BROMIDE 10 MG/ML (PF) SYRINGE
PREFILLED_SYRINGE | INTRAVENOUS | Status: DC | PRN
Start: 2023-11-13 — End: 2023-11-13
  Administered 2023-11-13: 40 mg via INTRAVENOUS

## 2023-11-13 MED ORDER — PROPOFOL 10 MG/ML IV BOLUS
INTRAVENOUS | Status: AC
  Filled 2023-11-13: qty 20

## 2023-11-13 MED ORDER — LIDOCAINE-EPINEPHRINE 1 %-1:100000 IJ SOLN
INTRAMUSCULAR | Status: AC
  Filled 2023-11-13: qty 1

## 2023-11-13 MED ORDER — IOHEXOL 300 MG/ML  SOLN
INTRAMUSCULAR | Status: DC | PRN
  Administered 2023-11-13: 10 mL

## 2023-11-13 MED ORDER — CHLORHEXIDINE GLUCONATE 0.12 % MT SOLN
15.0000 mL | Freq: Once | OROMUCOSAL | Status: AC
  Administered 2023-11-13: 15 mL via OROMUCOSAL
  Filled 2023-11-13: qty 15

## 2023-11-13 MED ORDER — PROPOFOL 10 MG/ML IV BOLUS
INTRAVENOUS | Status: DC | PRN
Start: 2023-11-13 — End: 2023-11-13
  Administered 2023-11-13: 100 mg via INTRAVENOUS

## 2023-11-13 MED ORDER — DEXAMETHASONE SODIUM PHOSPHATE 10 MG/ML IJ SOLN
INTRAMUSCULAR | Status: DC | PRN
  Administered 2023-11-13: 10 mg via INTRAVENOUS

## 2023-11-13 MED ORDER — FENTANYL CITRATE (PF) 250 MCG/5ML IJ SOLN
INTRAMUSCULAR | Status: AC
  Filled 2023-11-13: qty 5

## 2023-11-13 MED ORDER — ONDANSETRON HCL 4 MG/2ML IJ SOLN: 4.0000 mg | Freq: Once | INTRAMUSCULAR | Status: DC | PRN

## 2023-11-13 MED ORDER — CEFAZOLIN SODIUM 1 G IJ SOLR
INTRAMUSCULAR | Status: AC
  Filled 2023-11-13: qty 20

## 2023-11-13 MED ORDER — OXYCODONE HCL 5 MG PO TABS
5.0000 mg | ORAL_TABLET | Freq: Once | ORAL | Status: AC
  Administered 2023-11-13: 5 mg via ORAL

## 2023-11-13 MED ORDER — ORAL CARE MOUTH RINSE: 15.0000 mL | Freq: Once | OROMUCOSAL | Status: AC

## 2023-11-13 MED ORDER — AMISULPRIDE (ANTIEMETIC) 5 MG/2ML IV SOLN: 10.0000 mg | Freq: Once | INTRAVENOUS | Status: DC | PRN

## 2023-11-13 MED ORDER — PHENYLEPHRINE HCL-NACL 20-0.9 MG/250ML-% IV SOLN
INTRAVENOUS | Status: DC | PRN
  Administered 2023-11-13: 30 ug/min via INTRAVENOUS

## 2023-11-13 MED ORDER — ONDANSETRON HCL 4 MG/2ML IJ SOLN
INTRAMUSCULAR | Status: DC | PRN
  Administered 2023-11-13: 4 mg via INTRAVENOUS

## 2023-11-13 MED ORDER — OXYCODONE HCL 5 MG PO TABS: 5.0000 mg | ORAL_TABLET | Freq: Four times a day (QID) | ORAL | 0 refills | Status: DC | PRN

## 2023-11-13 MED ORDER — PHENYLEPHRINE 80 MCG/ML (10ML) SYRINGE FOR IV PUSH (FOR BLOOD PRESSURE SUPPORT)
PREFILLED_SYRINGE | INTRAVENOUS | Status: DC | PRN
  Administered 2023-11-13: 80 ug via INTRAVENOUS
  Administered 2023-11-13 (×2): 160 ug via INTRAVENOUS

## 2023-11-13 MED ORDER — LIDOCAINE 2% (20 MG/ML) 5 ML SYRINGE
INTRAMUSCULAR | Status: DC | PRN
  Administered 2023-11-13: 60 mg via INTRAVENOUS

## 2023-11-13 MED ORDER — ACETAMINOPHEN 10 MG/ML IV SOLN
INTRAVENOUS | Status: DC | PRN
  Administered 2023-11-13: 1000 mg via INTRAVENOUS

## 2023-11-13 MED ORDER — ACETAMINOPHEN 10 MG/ML IV SOLN: 1000.0000 mg | Freq: Once | INTRAVENOUS | Status: DC | PRN

## 2023-11-13 MED ORDER — OXYCODONE HCL 5 MG PO TABS
ORAL_TABLET | ORAL | Status: AC
  Filled 2023-11-13: qty 1

## 2023-11-13 MED ORDER — FENTANYL CITRATE (PF) 250 MCG/5ML IJ SOLN
INTRAMUSCULAR | Status: DC | PRN
  Administered 2023-11-13: 100 ug via INTRAVENOUS
  Administered 2023-11-13: 50 ug via INTRAVENOUS

## 2023-11-13 MED ORDER — 0.9 % SODIUM CHLORIDE (POUR BTL) OPTIME
TOPICAL | Status: DC | PRN
  Administered 2023-11-13: 1000 mL

## 2023-11-13 SURGICAL SUPPLY — 33 items
BAG COUNTER SPONGE SURGICOUNT (BAG) ×1 IMPLANT
BLADE CLIPPER SURG (BLADE) IMPLANT
CEMENT KYPHON C01A KIT/MIXER (Cement) IMPLANT
DERMABOND ADVANCED .7 DNX12 (GAUZE/BANDAGES/DRESSINGS) IMPLANT
DRAPE C-ARM 42X72 X-RAY (DRAPES) ×1 IMPLANT
DRAPE INCISE IOBAN 66X45 STRL (DRAPES) ×1 IMPLANT
DRAPE LAPAROTOMY 100X72X124 (DRAPES) ×1 IMPLANT
DRAPE SURG 17X23 STRL (DRAPES) ×1 IMPLANT
DRAPE WARM FLUID 44X44 (DRAPES) IMPLANT
DURAPREP 26ML APPLICATOR (WOUND CARE) ×1 IMPLANT
GAUZE 4X4 16PLY ~~LOC~~+RFID DBL (SPONGE) IMPLANT
GLOVE BIO SURGEON STRL SZ7 (GLOVE) ×2 IMPLANT
GLOVE BIOGEL PI IND STRL 7.5 (GLOVE) ×2 IMPLANT
GLOVE ECLIPSE 7.5 STRL STRAW (GLOVE) ×1 IMPLANT
GOWN STRL REUS W/ TWL LRG LVL3 (GOWN DISPOSABLE) ×1 IMPLANT
GOWN STRL REUS W/ TWL XL LVL3 (GOWN DISPOSABLE) ×1 IMPLANT
GOWN STRL REUS W/TWL 2XL LVL3 (GOWN DISPOSABLE) IMPLANT
INTRODUCER DEVICE OSTEO LEVEL (INTRODUCER) IMPLANT
KIT BASIN OR (CUSTOM PROCEDURE TRAY) ×1 IMPLANT
KIT TURNOVER KIT B (KITS) ×1 IMPLANT
NDL HYPO 22X1.5 SAFETY MO (MISCELLANEOUS) ×1 IMPLANT
NEEDLE HYPO 22X1.5 SAFETY MO (MISCELLANEOUS) ×1 IMPLANT
NS IRRIG 1000ML POUR BTL (IV SOLUTION) ×1 IMPLANT
PACK SRG BSC III STRL LF ECLPS (CUSTOM PROCEDURE TRAY) ×1 IMPLANT
PAD ARMBOARD 7.5X6 YLW CONV (MISCELLANEOUS) ×3 IMPLANT
SPIKE FLUID TRANSFER (MISCELLANEOUS) ×1 IMPLANT
SUT VIC AB 3-0 SH 8-18 (SUTURE) ×2 IMPLANT
TAMP BONE INFLATABLE 10/3 (BALLOONS) IMPLANT
TOWEL GREEN STERILE (TOWEL DISPOSABLE) ×1 IMPLANT
TOWEL GREEN STERILE FF (TOWEL DISPOSABLE) ×1 IMPLANT
TRAY KYPHOPAK 15/3 ONESTEP 1ST (MISCELLANEOUS) IMPLANT
TRAY KYPHOPAK 20/3 EXPRESS 1ST (MISCELLANEOUS) IMPLANT
WATER STERILE IRR 1000ML POUR (IV SOLUTION) ×1 IMPLANT

## 2023-11-13 NOTE — H&P (Signed)
 CC: back pain  HPI:     Patient is a 73 y.o. female presents with painful T11 and L1 compression fractures, refractory to medical and physical therapy.  She is here for elective kyphoplasty.    Patient Active Problem List   Diagnosis Date Noted   Class 2 severe obesity due to excess calories with serious comorbidity and body mass index (BMI) of 38.0 to 38.9 in adult [facility]) 10/25/2023   Acute exacerbation of congestive heart failure (HCC) 10/23/2023   Acute on chronic respiratory failure with hypoxia (HCC) 10/23/2023   Unsteady gait when walking 09/05/2023   Low back pain 09/05/2023   Hypernatremia 09/05/2023   Chronic constipation 05/10/2023   Hemorrhoids 05/10/2023   Closed fracture of right acetabulum (HCC) 05/02/2023   Acetabular fracture (HCC) 03/14/2023   Candidiasis of skin 02/12/2023   Osteoporosis 01/22/2023   Proteinuria 06/15/2022   Obstructive sleep apnea 09/08/2021   Congestive heart failure (HCC) 07/29/2021   Hyperglycemia due to type 2 diabetes mellitus (HCC) 05/04/2021   Chronic systolic heart failure (HCC) 04/09/2021   Hypoxia    Acute hypoxemic respiratory failure (HCC) 04/05/2021   SOB (shortness of breath) 04/05/2021   Tremor 04/05/2021   Impaired fasting glucose 01/25/2021   Gastroesophageal reflux disease without esophagitis 01/25/2021   Mixed hyperlipidemia 01/25/2021   Morbid obesity (HCC) 01/25/2021   Prediabetes 01/25/2021   Tachycardia 01/25/2021   Pain in left knee 05/20/2020   Essential hypertension 10/08/2019   Uncontrolled diabetes mellitus 10/08/2019   UTI (urinary tract infection) 10/08/2019   Thrush 10/08/2019   Pneumonia due to COVID-19 virus 10/08/2019   Chronic respiratory failure with hypoxia (HCC) 10/02/2019   Acute respiratory distress syndrome (ARDS) due to COVID-19 virus (HCC) 10/02/2019   Pain in joint of right shoulder 07/15/2018   Pain in right foot 07/15/2018   Special screening for malignant neoplasms, colon 11/06/2016    Past Medical History:  Diagnosis Date   Acute respiratory distress syndrome (ARDS) due to COVID-19 virus (HCC) 09/2019   Chronic combined systolic and diastolic CHF (congestive heart failure) (HCC)    Diabetes mellitus, type 2 (HCC)    Dyspnea    with exertion   GERD (gastroesophageal reflux disease)    Hyperlipidemia    Hypertension    Sleep apnea     Past Surgical History:  Procedure Laterality Date   COLONOSCOPY N/A 12/21/2016   Procedure: COLONOSCOPY;  Surgeon: Malissa Hippo, MD;  Location: [facility];  Service: Endoscopy;  Laterality: N/A;  930   ORIF ACETABULAR FRACTURE Left 03/16/2023   Procedure: OPEN REDUCTION INTERNAL FIXATION (ORIF) ACETABULAR FRACTURE STOPPA APPROACH;  Surgeon: Roby Lofts, MD;  Location: [facility] OR;  Service: Orthopedics;  Laterality: Left;    Medications Prior to Admission  Medication Sig Dispense Refill Last Dose/Taking   acetaminophen (TYLENOL) 325 MG tablet Take 2 tablets (650 mg total) by mouth every 6 (six) hours as needed for mild pain (or Fever >/= 101). 12 tablet 0 11/12/2023   amLODipine (NORVASC) 10 MG tablet Take 1 tablet (10 mg total) by mouth daily. 30 tablet 3 11/13/2023 at  7:00 AM   carvedilol (COREG) 6.25 MG tablet TAKE 1 TABLET TWICE A DAY 180 tablet 2 11/13/2023 at  7:00 AM   empagliflozin (JARDIANCE) 10 MG TABS tablet Take 1 tablet (10 mg total) by mouth daily before breakfast. 90 tablet 3 11/12/2023   furosemide (LASIX) 40 MG tablet Take 1 tablet (40 mg total) by mouth daily. 30 tablet 2 11/12/2023  losartan (COZAAR) 100 MG tablet Take 1 tablet (100 mg total) by mouth daily. 30 tablet 5 11/12/2023   Multiple Vitamin (MULTIVITAMIN) tablet Take 1 tablet by mouth daily.   11/12/2023   omeprazole (PRILOSEC) 40 MG capsule Take 40 mg by mouth daily.   11/13/2023 at  7:00 AM   rosuvastatin (CRESTOR) 10 MG tablet Take 10 mg by mouth daily.   11/13/2023 at  7:00 AM   denosumab (PROLIA) 60 MG/ML SOSY injection INJECT 60 MG INTO THE SKIN EVERY 6  MONTHS.   09/26/2023   docusate sodium (COLACE) 100 MG capsule Take 1 capsule (100 mg total) by mouth 2 (two) times daily. 10 capsule 0    HYDROcodone-acetaminophen (NORCO/VICODIN) 5-325 MG tablet Take 1-2 tablets by mouth every 6 (six) hours as needed for moderate pain or severe pain. 10 tablet 0 11/11/2023   LINZESS 145 MCG CAPS capsule Take 145 mcg by mouth daily.   11/11/2023   methocarbamol (ROBAXIN) 500 MG tablet Take 1 tablet (500 mg total) by mouth every 6 (six) hours as needed for muscle spasms. 10 tablet 0 More than a month   VASCEPA 1 g capsule Take 2 g by mouth 2 (two) times daily.   11/11/2023   No Known Allergies  Social History   Tobacco Use   Smoking status: Never   Smokeless tobacco: Never  Substance Use Topics   Alcohol use: No    Family History  Problem Relation Age of Onset   Breast cancer Mother    Heart attack Father    Colon cancer Neg Hx      Review of Systems Pertinent items are noted in HPI.  Objective:   Patient Vitals for the past 8 hrs:  BP Temp Temp src Pulse Resp SpO2 Height Weight  11/13/23 0826 (!) 178/85 98 F (36.7 C) Oral 92 18 91 % 4\' 10"  (1.473 m) 82.6 kg   No intake/output data recorded. No intake/output data recorded.      General : Alert, cooperative, no distress, appears stated age. obese   Head:  Normocephalic/atraumatic    Eyes: PERRL, conjunctiva/corneas clear, EOM's intact. Fundi could not be visualized Neck: Supple Chest:  Respirations unlabored Chest wall: no tenderness or deformity Heart: Regular rate and rhythm Abdomen: Soft, nontender and nondistended Extremities: warm and well-perfused Skin: normal turgor, color and texture Neurologic:  Alert, oriented x 3.  Eyes open spontaneously. PERRL, EOMI, VFC, no facial droop. V1-3 intact.  No dysarthria, tongue protrusion symmetric.  CNII-XII intact. Normal strength, sensation and reflexes throughout.  No pronator drift, full strength in legs       Data ReviewCBC:  Lab  Results  Component Value Date   WBC 9.5 10/24/2023   RBC 4.67 10/24/2023   BMP:  Lab Results  Component Value Date   GLUCOSE 118 (H) 10/25/2023   CO2 34 (H) 10/25/2023   BUN 17 10/25/2023   CREATININE 0.43 (L) 10/25/2023   CALCIUM 8.3 (L) 10/25/2023   Radiology review:  See clinic note for details  Assessment:   Active Problems:   * No active hospital problems. *  Painful compression fractures, T11 and L1 Plan:   - Kyphoplasty today - Risks, benefits, alternatives, and expected convalescence were discussed with her.  Risks discussed included, but were not limited to bleeding, pain, infection, scar, spinal fluid leak, neurologic deficit, embolization, extravasation,, damage to nearby organs, and death.  Informed consent was obtained.

## 2023-11-13 NOTE — Transfer of Care (Signed)
 Immediate Anesthesia Transfer of Care Note  Patient: Kristina [name]  Procedure(s) Performed: - Thoracic eleven - Lumbar one kyphoplasty  Patient Location: PACU  Anesthesia Type:General  Level of Consciousness: awake, alert , and oriented  Airway & Oxygen Therapy: Patient Spontanous Breathing  Post-op Assessment: Report given to RN  Post vital signs: Reviewed and stable  Last Vitals:  Vitals Value Taken Time  BP 154/77 11/13/23 1352  Temp    Pulse 89 11/13/23 1356  Resp 20 11/13/23 1356  SpO2 97 % 11/13/23 1356  Vitals shown include unfiled device data.  Last Pain:  Vitals:   11/13/23 0912  TempSrc:   PainSc: 0-No pain         Complications: No notable events documented.

## 2023-11-13 NOTE — OR Nursing (Signed)
 Kyphon hv-r expires 05/27 lot number [id number] used during procedure

## 2023-11-13 NOTE — Op Note (Signed)
 PREOP DIAGNOSIS: T11, L1 osteoporotic compression fractures  POSTOP DIAGNOSIS: T11, L1 osteoporotic compression fractures  PROCEDURE: 1. T11 balloon kyphoplasty with cement augmentation 2. L1 balloon kyphoplasty with cement augmentation  SURGEON: Dr. Hoyt Koch, MD  ANESTHESIA: GETA  EBL: Minimal  SPECIMENS: None  DRAINS: None  COMPLICATIONS: None immediate  CONDITION: Hemodynamically stable to PCAU  HISTORY: Kristina [name] is a 73 y.o. yo female with hx of multiple compression fractures who developed acute mechanical back pain, persistent despite medical and physical therapy.  MRI showed acute T11 and L1 compression fractures, old healed compression fractures elsewhere.  Risks, benefits, alternatives, and expected convalescence were discussed with her.  Risks discussed included, but were not limited to bleeding, pain, infection, scar, spinal fluid leak, neurologic deficit, embolization, extravasation,, damage to nearby organs, and death.  Informed consent was obtained.   PROCEDURE IN DETAIL: After informed consent was obtained and witnessed, the patient was brought to the operating room. After induction of anesthesia, the patient was positioned on the operative table in the prone position. All pressure points were meticulously padded. Fluoroscopy was used to mark out the projection of the T11 and L1 pedicles on the skin. Skin incision was then marked out and prepped and draped in the usual sterile fashion.  After time out was conducted, bilateral stab incisions were made at T11 and L1 and Jamshidi needles were introduced. Under AP and lateral fluoroscopic guidance, bilateral T11 and L1 pedicles were canulated. Drill was then used to create a channel for cement at each of the leves bilaterally. Kyphoplasty balloon was placed and expanded to create a cavity bilaterally at T11 and at right L1.  The left L1 pedicle was tiny and the approach fairly straight on so I did not use balloon  through this cannula to avoid possible fracture through the lateral wall.  Approximately 1.5 ml of PMMA cement was injected in each side under AP and lateral fluoroscopy, taking care to preserve the posterior cortex as well as endplates and other cortices. The balloons were then removed, replaced with the trocars, and the Jamshidi needles removed.  Stab incisions were then closed with 3-0 vicryl suture and Dermabond. The patient was then transferred to the stretcher and taken to the PACU in stable hemodynamic condition.  At the end of the case all sponge, needle, and instrument counts were correct.

## 2023-11-13 NOTE — Anesthesia Postprocedure Evaluation (Signed)
 Anesthesia Post Note  Patient: Bobbe Medico Tomassi  Procedure(s) Performed: - Thoracic eleven - Lumbar one kyphoplasty     Patient location during evaluation: PACU Anesthesia Type: General Level of consciousness: awake Pain management: pain level controlled Vital Signs Assessment: post-procedure vital signs reviewed and stable Respiratory status: spontaneous breathing, nonlabored ventilation and respiratory function stable Cardiovascular status: blood pressure returned to baseline and stable Postop Assessment: no apparent nausea or vomiting Anesthetic complications: no   No notable events documented.  Last Vitals:  Vitals:   11/13/23 1415 11/13/23 1430  BP: 137/77 138/73  Pulse: 86 87  Resp: 15 14  Temp:  36.4 C  SpO2: 93% 92%    Last Pain:  Vitals:   11/13/23 1430  TempSrc:   PainSc: 3                  Grettel Rames P Laporcha Marchesi

## 2023-11-14 ENCOUNTER — Encounter (HOSPITAL_COMMUNITY): Payer: Self-pay | Admitting: Neurosurgery

## 2023-11-16 DIAGNOSIS — R0902 Hypoxemia: Secondary | ICD-10-CM | POA: Diagnosis not present

## 2023-11-19 ENCOUNTER — Other Ambulatory Visit (HOSPITAL_COMMUNITY): Payer: Self-pay | Admitting: Respiratory Therapy

## 2023-11-19 DIAGNOSIS — R0902 Hypoxemia: Secondary | ICD-10-CM

## 2023-11-22 ENCOUNTER — Ambulatory Visit: Payer: Self-pay | Admitting: *Deleted

## 2023-11-22 NOTE — Patient Instructions (Signed)
 Visit Information  Thank you for taking time to visit with me today. Please don't hesitate to contact me if I can be of assistance to you.   Following are the goals we discussed today:   Goals Addressed             This Visit's Progress    Manage her back pain/fracture, congestive heart failure, montior for symptoms of fibroid - care managerservices   On track    Patient will get an appointment with her chiropractor for hip pain - completed 10/22/23 Patent will follow up with her pcp for injection in December 2024. 11/22/23 patient had back surgery, didn't  follow up.  Interventions Today    Flowsheet Row Most Recent Value  Chronic Disease   Chronic disease during today's visit Diabetes, Congestive Heart Failure (CHF), Hypertension (HTN), Other  [back surgery]  General Interventions   General Interventions Discussed/Reviewed General Interventions Reviewed, Durable Medical Equipment (DME), Community Resources, Doctor Visits  Doctor Visits Discussed/Reviewed Doctor Visits Reviewed, PCP, Specialist  [see Dr Maisie Fus 12/18/23]  Durable Medical Equipment (DME) Dan Humphreys, Other  [cane]  Wheelchair Standard  PCP/Specialist Visits Compliance with follow-up visit  Exercise Interventions   Exercise Discussed/Reviewed Exercise Reviewed, Physical Activity, Assistive device use and maintanence  [encouraged to follow post back surgery restrictions]  Physical Activity Discussed/Reviewed Physical Activity Reviewed, Home Exercise Program (HEP)  [discussed home health]  Education Interventions   Education Provided Provided Education  [back mobility restrictions, guidelines for home health, manuvering on a high bed after back surgery]  Provided Verbal Education On Nutrition, Exercise, When to see the doctor, Programmer, applications  Mental Health Interventions   Mental Health Discussed/Reviewed Mental Health Reviewed, Coping Strategies, Other  [socialization]  Nutrition Interventions   Nutrition  Discussed/Reviewed Nutrition Reviewed, Adding fruits and vegetables, Fluid intake, Portion sizes, Decreasing sugar intake, Increasing proteins, Decreasing fats, Decreasing salt  Pharmacy Interventions   Pharmacy Dicussed/Reviewed Pharmacy Topics Reviewed, Affording Medications  Safety Interventions   Safety Discussed/Reviewed Safety Reviewed, Fall Risk, Home Safety  Home Safety Assistive Devices  Advanced Directive Interventions   Advanced Directives Discussed/Reviewed Advanced Directives Discussed  [not interested]              Our next appointment is by telephone on 12/31/23 at 1100  Please call the care guide team at [phone] if you need to cancel or reschedule your appointment.   If you are experiencing a Mental Health or Behavioral Health Crisis or need someone to talk to, please call the Suicide and Crisis Lifeline: 988 call the [place] National Suicide Prevention Lifeline: (405)590-8175 or TTY: 603-182-5878 TTY (757) 665-3144) to talk to a trained counselor call 1-800-273-TALK (toll free, 24 hour hotline) call the [facility]: (408) 432-1217 call 911   Patient verbalizes understanding of instructions and care plan provided today and agrees to view in MyChart. Active MyChart status and patient understanding of how to access instructions and care plan via MyChart confirmed with patient.     The patient has been provided with contact information for the care management team and has been advised to call with any health related questions or concerns.   Zarius Furr L. Noelle Penner, RN, BSN, CCM [place]  [facility], [facility] RN Care Manager Direct Dial: [phone]  Fax: [phone] Mailing Address: [address]  [place] [address] Website: [email or web address]

## 2023-11-22 NOTE — Patient Outreach (Signed)
 Care Coordination   Follow Up Visit Note   12/28/2023 updated noted for 11/22/23 Name: Kristina [name] MRN: [id number] DOB: [birth year]  Kristina [name] is a 73 y.o. year old female who sees Margo Aye, Kathleene Hazel, MD for primary care. I spoke with  Kristina [name] by phone today.  What matters to the patients health and wellness today?  Post Back surgery progression  Mobility restrictions reviewed from her Discharged (D/C) sheet. Questions answered  Last night leg hurt "like crazy" Slept in bed until 6 am the got up as was hurting Feell better in her recliner This morning not able to walk Dr Hoyt Koch  Go back to MD in a month December 18 2023  She has walker, cane, Go to bathroom, down the hall She has support, medicines, equipment, food, safe  housing,   Today she is at the [place] [facility] exercising with friends and going out to lunch with them later on  - she has been watching them exercise  She is able to sit in a chair and move her upper body NO extended twists  Discussed the guidelines for home health  Encouraged her to follow her restrictions until she is seen by her surgeon  Answers to questions she had about her high bed. She does not want to sleep in her bed because she head to roll out the bed this morning .  She does have access to a bed in the home that is not high she can use  Go to church every Sunday Discussed options to get her in the front seat  Family members are taking her to lunch today    Goals Addressed             This Visit's Progress    Manage her back pain/fracture, congestive heart failure, montior for symptoms of fibroid - care managerservices   On track    Patient will get an appointment with her chiropractor for hip pain - completed 10/22/23 Patent will follow up with her pcp for injection in December 2024. 11/22/23 patient had back surgery, didn't  follow up.  Interventions Today    Flowsheet Row Most Recent Value  Chronic Disease   Chronic  disease during today's visit Diabetes, Congestive Heart Failure (CHF), Hypertension (HTN), Other  [back surgery]  General Interventions   General Interventions Discussed/Reviewed General Interventions Reviewed, Durable Medical Equipment (DME), Community Resources, Doctor Visits  Doctor Visits Discussed/Reviewed Doctor Visits Reviewed, PCP, Specialist  [see Dr Maisie Fus 12/18/23]  Durable Medical Equipment (DME) Dan Humphreys, Other  [cane]  Wheelchair Standard  PCP/Specialist Visits Compliance with follow-up visit  Exercise Interventions   Exercise Discussed/Reviewed Exercise Reviewed, Physical Activity, Assistive device use and maintanence  [encouraged to follow post back surgery restrictions]  Physical Activity Discussed/Reviewed Physical Activity Reviewed, Home Exercise Program (HEP)  [discussed home health]  Education Interventions   Education Provided Provided Education  [back mobility restrictions, guidelines for home health, manuvering on a high bed after back surgery]  Provided Verbal Education On Nutrition, Exercise, When to see the doctor, Programmer, applications  Mental Health Interventions   Mental Health Discussed/Reviewed Mental Health Reviewed, Coping Strategies, Other  [socialization]  Nutrition Interventions   Nutrition Discussed/Reviewed Nutrition Reviewed, Adding fruits and vegetables, Fluid intake, Portion sizes, Decreasing sugar intake, Increasing proteins, Decreasing fats, Decreasing salt  Pharmacy Interventions   Pharmacy Dicussed/Reviewed Pharmacy Topics Reviewed, Affording Medications  Safety Interventions   Safety Discussed/Reviewed Safety Reviewed, Fall Risk, Home Safety  Home Safety Assistive Devices  Advanced Directive Interventions   Advanced Directives Discussed/Reviewed Advanced Directives Discussed  [not interested]              SDOH assessments and interventions completed:  No     Care Coordination Interventions:  Yes, provided   Follow up plan: Follow up  call scheduled for 12/06/23 1100/ close after 12/18/23 surgeon visit     Encounter Outcome:  Patient Visit Completed   Cala Bradford L. Noelle Penner, RN, BSN, CCM [place]  [facility], [facility] Health RN Care Manager Direct Dial: [phone]  Fax: [phone] Mailing Address: [address]  [place] [address] Website: [email or web address]

## 2023-12-03 DIAGNOSIS — S22080A Wedge compression fracture of T11-T12 vertebra, initial encounter for closed fracture: Secondary | ICD-10-CM | POA: Diagnosis not present

## 2023-12-03 DIAGNOSIS — S32010A Wedge compression fracture of first lumbar vertebra, initial encounter for closed fracture: Secondary | ICD-10-CM | POA: Diagnosis not present

## 2023-12-03 DIAGNOSIS — Z6838 Body mass index (BMI) 38.0-38.9, adult: Secondary | ICD-10-CM | POA: Diagnosis not present

## 2023-12-06 ENCOUNTER — Encounter (HOSPITAL_COMMUNITY)

## 2023-12-06 ENCOUNTER — Ambulatory Visit (HOSPITAL_COMMUNITY)
Admission: RE | Admit: 2023-12-06 | Discharge: 2023-12-06 | Disposition: A | Discharge status: Home / Self Care | Source: Ambulatory Visit | Attending: Family Medicine | Admitting: Family Medicine

## 2023-12-06 DIAGNOSIS — R0902 Hypoxemia: Secondary | ICD-10-CM | POA: Insufficient documentation

## 2023-12-06 LAB — PULMONARY FUNCTION TEST
DL/VA % pred: 130 %
DL/VA: 5.68 ml/min/mmHg/L
DLCO unc % pred: 82 %
DLCO unc: 12.74 ml/min/mmHg
FEF 25-75 Post: 1.02 L/s
FEF 25-75 Pre: 1.31 L/s
FEF2575-%Change-Post: -21 %
FEF2575-%Pred-Post: 67 %
FEF2575-%Pred-Pre: 85 %
FEV1-%Change-Post: 0 %
FEV1-%Pred-Post: 60 %
FEV1-%Pred-Pre: 60 %
FEV1-Post: 1.03 L
FEV1-Pre: 1.02 L
FEV1FVC-%Change-Post: -5 %
FEV1FVC-%Pred-Pre: 113 %
FEV6-%Change-Post: 8 %
FEV6-%Pred-Post: 58 %
FEV6-%Pred-Pre: 54 %
FEV6-Post: 1.26 L
FEV6-Pre: 1.17 L
FEV6FVC-%Pred-Post: 105 %
FEV6FVC-%Pred-Pre: 105 %
FVC-%Change-Post: 6 %
FVC-%Pred-Post: 55 %
FVC-%Pred-Pre: 52 %
FVC-Post: 1.26 L
FVC-Pre: 1.19 L
Post FEV1/FVC ratio: 82 %
Post FEV6/FVC ratio: 100 %
Pre FEV1/FVC ratio: 86 %
Pre FEV6/FVC Ratio: 100 %
RV % pred: 60 %
RV: 1.16 L
TLC % pred: 63 %
TLC: 2.63 L

## 2023-12-06 MED ORDER — ALBUTEROL SULFATE (2.5 MG/3ML) 0.083% IN NEBU
2.5000 mg | INHALATION_SOLUTION | Freq: Once | RESPIRATORY_TRACT | Status: AC
  Administered 2023-12-06: 2.5 mg via RESPIRATORY_TRACT

## 2023-12-10 ENCOUNTER — Other Ambulatory Visit (HOSPITAL_COMMUNITY): Payer: Self-pay | Admitting: Internal Medicine

## 2023-12-10 DIAGNOSIS — S32402D Unspecified fracture of left acetabulum, subsequent encounter for fracture with routine healing: Secondary | ICD-10-CM | POA: Diagnosis not present

## 2023-12-10 DIAGNOSIS — Z1231 Encounter for screening mammogram for malignant neoplasm of breast: Secondary | ICD-10-CM

## 2023-12-10 DIAGNOSIS — R2689 Other abnormalities of gait and mobility: Secondary | ICD-10-CM | POA: Diagnosis not present

## 2023-12-11 DIAGNOSIS — R7303 Prediabetes: Secondary | ICD-10-CM | POA: Diagnosis not present

## 2023-12-11 DIAGNOSIS — E785 Hyperlipidemia, unspecified: Secondary | ICD-10-CM | POA: Diagnosis not present

## 2023-12-14 DIAGNOSIS — N898 Other specified noninflammatory disorders of vagina: Secondary | ICD-10-CM | POA: Diagnosis not present

## 2023-12-14 DIAGNOSIS — N819 Female genital prolapse, unspecified: Secondary | ICD-10-CM | POA: Insufficient documentation

## 2023-12-20 DIAGNOSIS — R0902 Hypoxemia: Secondary | ICD-10-CM | POA: Diagnosis not present

## 2023-12-20 DIAGNOSIS — K5909 Other constipation: Secondary | ICD-10-CM | POA: Diagnosis not present

## 2023-12-20 DIAGNOSIS — M810 Age-related osteoporosis without current pathological fracture: Secondary | ICD-10-CM | POA: Diagnosis not present

## 2023-12-20 DIAGNOSIS — R7303 Prediabetes: Secondary | ICD-10-CM | POA: Diagnosis not present

## 2023-12-20 DIAGNOSIS — R2689 Other abnormalities of gait and mobility: Secondary | ICD-10-CM | POA: Diagnosis not present

## 2023-12-20 DIAGNOSIS — K649 Unspecified hemorrhoids: Secondary | ICD-10-CM | POA: Diagnosis not present

## 2023-12-20 DIAGNOSIS — S32401D Unspecified fracture of right acetabulum, subsequent encounter for fracture with routine healing: Secondary | ICD-10-CM | POA: Diagnosis not present

## 2023-12-20 DIAGNOSIS — I110 Hypertensive heart disease with heart failure: Secondary | ICD-10-CM | POA: Diagnosis not present

## 2023-12-20 DIAGNOSIS — I509 Heart failure, unspecified: Secondary | ICD-10-CM | POA: Diagnosis not present

## 2023-12-20 DIAGNOSIS — E785 Hyperlipidemia, unspecified: Secondary | ICD-10-CM | POA: Diagnosis not present

## 2023-12-20 DIAGNOSIS — G4733 Obstructive sleep apnea (adult) (pediatric): Secondary | ICD-10-CM | POA: Diagnosis not present

## 2023-12-31 ENCOUNTER — Encounter: Payer: Self-pay | Admitting: *Deleted

## 2023-12-31 ENCOUNTER — Ambulatory Visit: Payer: Self-pay | Admitting: *Deleted

## 2023-12-31 DIAGNOSIS — Z6839 Body mass index (BMI) 39.0-39.9, adult: Secondary | ICD-10-CM | POA: Diagnosis not present

## 2023-12-31 DIAGNOSIS — S32010A Wedge compression fracture of first lumbar vertebra, initial encounter for closed fracture: Secondary | ICD-10-CM | POA: Diagnosis not present

## 2024-01-07 ENCOUNTER — Other Ambulatory Visit: Payer: Self-pay

## 2024-01-07 ENCOUNTER — Ambulatory Visit (HOSPITAL_COMMUNITY): Attending: Surgery

## 2024-01-07 DIAGNOSIS — R29898 Other symptoms and signs involving the musculoskeletal system: Secondary | ICD-10-CM | POA: Diagnosis not present

## 2024-01-07 DIAGNOSIS — M4856XA Collapsed vertebra, not elsewhere classified, lumbar region, initial encounter for fracture: Secondary | ICD-10-CM | POA: Insufficient documentation

## 2024-01-07 DIAGNOSIS — M5459 Other low back pain: Secondary | ICD-10-CM | POA: Diagnosis not present

## 2024-01-07 NOTE — Therapy (Signed)
 OUTPATIENT PHYSICAL THERAPY THORACOLUMBAR EVALUATION   Patient Name: Kristina [name] MRN: [id number] DOB:1950/12/25, 73 y.o., female Today's Date: 01/07/2024  END OF SESSION:  PT End of Session - 01/07/24 1147     Visit Number 1    Number of Visits 12    Date for PT Re-Evaluation 02/04/24    Authorization Type [employer]    Authorization Time Period No auth required    Progress Note Due on Visit 10    PT Start Time 1147    PT Stop Time 1230    PT Time Calculation (min) 43 min    Activity Tolerance Patient tolerated treatment well    Behavior During Therapy WFL for tasks assessed/performed             Past Medical History:  Diagnosis Date   Acute respiratory distress syndrome (ARDS) due to COVID-19 virus (HCC) 09/2019   Chronic combined systolic and diastolic CHF (congestive heart failure) (HCC)    Diabetes mellitus, type 2 (HCC)    Dyspnea    with exertion   GERD (gastroesophageal reflux disease)    Hyperlipidemia    Hypertension    Sleep apnea    Past Surgical History:  Procedure Laterality Date   COLONOSCOPY N/A 12/21/2016   Procedure: COLONOSCOPY;  Surgeon: Malissa Hippo, MD;  Location: [facility];  Service: Endoscopy;  Laterality: N/A;  930   KYPHOPLASTY N/A 11/13/2023   Procedure: - Thoracic eleven - Lumbar one kyphoplasty;  Surgeon: Bedelia Person, MD;  Location: [facility] OR;  Service: Neurosurgery;  Laterality: N/A;   ORIF ACETABULAR FRACTURE Left 03/16/2023   Procedure: OPEN REDUCTION INTERNAL FIXATION (ORIF) ACETABULAR FRACTURE STOPPA APPROACH;  Surgeon: Roby Lofts, MD;  Location: [facility] OR;  Service: Orthopedics;  Laterality: Left;   Patient Active Problem List   Diagnosis Date Noted   Class 2 severe obesity due to excess calories with serious comorbidity and body mass index (BMI) of 38.0 to 38.9 in adult [facility]) 10/25/2023   Acute exacerbation of congestive heart failure (HCC) 10/23/2023   Acute on chronic respiratory failure with hypoxia (HCC)  10/23/2023   Unsteady gait when walking 09/05/2023   Low back pain 09/05/2023   Hypernatremia 09/05/2023   Chronic constipation 05/10/2023   Hemorrhoids 05/10/2023   Closed fracture of right acetabulum (HCC) 05/02/2023   Acetabular fracture (HCC) 03/14/2023   Candidiasis of skin 02/12/2023   Osteoporosis 01/22/2023   Proteinuria 06/15/2022   Obstructive sleep apnea 09/08/2021   Congestive heart failure (HCC) 07/29/2021   Hyperglycemia due to type 2 diabetes mellitus (HCC) 05/04/2021   Chronic systolic heart failure (HCC) 04/09/2021   Hypoxia    Acute hypoxemic respiratory failure (HCC) 04/05/2021   SOB (shortness of breath) 04/05/2021   Tremor 04/05/2021   Impaired fasting glucose 01/25/2021   Gastroesophageal reflux disease without esophagitis 01/25/2021   Mixed hyperlipidemia 01/25/2021   Morbid obesity (HCC) 01/25/2021   Prediabetes 01/25/2021   Tachycardia 01/25/2021   Pain in left knee 05/20/2020   Essential hypertension 10/08/2019   Uncontrolled diabetes mellitus 10/08/2019   UTI (urinary tract infection) 10/08/2019   Thrush 10/08/2019   Pneumonia due to COVID-19 virus 10/08/2019   Chronic respiratory failure with hypoxia (HCC) 10/02/2019   Acute respiratory distress syndrome (ARDS) due to COVID-19 virus (HCC) 10/02/2019   Pain in joint of right shoulder 07/15/2018   Pain in right foot 07/15/2018   Special screening for malignant neoplasms, colon 11/06/2016    PCP: Benita Stabile, MD  REFERRING PROVIDER: Clovis Riley, PA-C  REFERRING DIAG:  W09.811B (ICD-10-CM) - Wedge compression fracture of first lumbar vertebra, initial encounter for closed fracture    Rationale for Evaluation and Treatment: Rehabilitation  THERAPY DIAG:  Other low back pain  Nontraumatic compression fracture of L1 vertebra, initial encounter (HCC)  Weakness of lower extremity, unspecified laterality  ONSET DATE: June 2024  SUBJECTIVE:                                                                                                                                                                                            SUBJECTIVE STATEMENT: Patient reports she fell and broke Left hip last year after stepping on a curb but a few months ago her back started hurting. They found compression fractures. Filled them with cement. And that pain has gotten better, She's been sleeping in recliner because its hard to get into higher bed. Her legs just feel weak. Sleeps on her side and has adjustable bed. STS is painful. Uses [employer] for long distance. (I) with iADLs and ADLs. Still driving  PERTINENT HISTORY:  ORIF Left hip summer 2024  PAIN:  Are you having pain? No  PRECAUTIONS: None  RED FLAGS: Bowel or bladder incontinence: Yes: Seeing doctor about it next week    WEIGHT BEARING RESTRICTIONS: No  FALLS:  Has patient fallen in last 6 months? No  LIVING ENVIRONMENT: Stairs: Yes: External: 3 steps; on right going up, on left going up, and can reach both Has following equipment at home: Single point cane, Walker - 2 wheeled, and bed side commode  OCCUPATION: Retired  PLOF: Independent  PATIENT GOALS: "Just hoping to strengthen legs"  NEXT MD VISIT: Next week   OBJECTIVE:  Note: Objective measures were completed at Evaluation unless otherwise noted.  DIAGNOSTIC FINDINGS:  COMPARISON:  08/06/2022   FINDINGS: Segmentation:  5 lumbar type vertebrae   Alignment:  Mild scoliosis   Vertebrae: Essentially resolved marrow edema at the level of L2 compression fracture seen on prior. Height loss did progress eccentric towards the left, over 50% at this level where there is also mild retropulsion. There is now marrow edema and compression deformity at the T11 and L1 vertebral bodies with a horizontal hypointense fracture plane best seen at L1. No evidence of aggressive bone lesion.   Conus medullaris and cauda equina: Conus extends to the T12-L1 level. Conus and  cauda equina appear normal.   Paraspinal and other soft tissues: Renal hilar and renal cortical cysts. No follow-up imaging recommended   Disc levels:   T12- L1: Disc space narrowing and circumferential bulging. Negative facets.  L1-L2: Disc space narrowing and bulging. Degenerative facet spurring asymmetric to the left. Mild left foraminal narrowing   L2-L3: Disc narrowing and bulging.  Negative facets.   L3-L4: Disc narrowing and bulging.  Mild facet spurring.   L4-L5: Disc collapse with endplate ridging and facet spurring centric to the right. Right foraminal protrusion and foraminal impingement to a moderate degree on sagittal images.   L5-S1:Moderate degenerative facet spurring with mild anterolisthesis. No herniation or impingement.   IMPRESSION: 1. Compression fractures at T11 and L1 which are acute or subacute. Height loss is mild at both levels and there is no retropulsion. 2. Interval healing of L2 compression fracture seen on 2023 comparison. Height loss progressed in the left vertebral body since prior. 3. Degenerative foraminal impingement on the right at L4-5  PATIENT SURVEYS:  Lower Extremity Functional Score: 29 / 80 = 36.3 %  COGNITION: Overall cognitive status: Within functional limits for tasks assessed     SENSATION: WFL  MUSCLE LENGTH: Hamstrings: Right WFL deg; Left WFL deg Modified Thomas test: Tightness noted bilat, L>R  POSTURE:  Kyphotic  PALPATION:   LUMBAR ROM:   AROM eval  Flexion Mid shin, painful  Extension 50%  Right lateral flexion   Left lateral flexion   Right rotation   Left rotation    (Blank rows = not tested)  LOWER EXTREMITY ROM:     Passive  Right eval Left eval  Hip flexion    Hip extension    Hip abduction    Hip adduction    Hip internal rotation ~30 ~20  Hip external rotation ~40 ~40  Knee flexion    Knee extension    Ankle dorsiflexion    Ankle plantarflexion    Ankle inversion    Ankle  eversion     (Blank rows = not tested)  LOWER EXTREMITY MMT:    MMT Right eval Left eval  Hip flexion 4- 3+, painful  Hip extension    Hip abduction    Hip adduction    Hip internal rotation    Hip external rotation    Knee flexion 4 4-  Knee extension 4 4-  Ankle dorsiflexion 5 5  Ankle plantarflexion    Ankle inversion    Ankle eversion     (Blank rows = not tested)  LUMBAR SPECIAL TESTS:    FUNCTIONAL TESTS:  5 times sit to stand: 12", SOB afterwards 2 minute walk test: 325', reports LE feel weak,   GAIT: Distance walked: 325 Assistive device utilized: None Level of assistance: Complete Independence Comments: Dec stance on R causing limp, dec arm swing with RUE, dec hip ext bilat, dec active DF bilat (R>L) causing mild hip hike  TREATMENT DATE:  01/07/24:  PT Evaluation and HEP admin                                                                                                                             PATIENT  EDUCATION:  Education details: PT evaluation, objective findings, POC, Importance of HEP, Precautions, Clinic policies Person educated: Patient Education method: Explanation and Demonstration Education comprehension: verbalized understanding and returned demonstration  HOME EXERCISE PROGRAM: Access Code: [email or web address] URL: [email or web address] Date: 01/07/2024 Prepared by: Virgia Griffins Powell-Butler  Exercises - Supine Bilateral Hip Internal Rotation Stretch  - 2 x daily - 7 x weekly - 2 sets - 10 reps - Supine Transversus Abdominis Bracing - Hands on Stomach  - 2 x daily - 7 x weekly - 2 sets - 10 reps - 5 hold  ASSESSMENT:  CLINICAL IMPRESSION: Patient is a 73 y.o. female  who was seen today for physical therapy evaluation and treatment for  S32.010A (ICD-10-CM) - Wedge compression fracture of first lumbar vertebra, initial encounter for closed fracture   Patient demonstrates decreased LE strength and passive ROM, decreased active lumbar  ROM, and decreased endurance which may be contributing to her altered gait mechanics, decreased activity tolerance, and overall pain. Patient reporting her main concern as LE weakness as her back pain is not as consistent. Patient educated on continuing to avoid forward flexion as precaution of her compression fractures. HEP given today to begin core strengthening and hip ROM. Patient will benefit from skilled physical therapy in order to address the above to improve her function and QOL.    OBJECTIVE IMPAIRMENTS: Abnormal gait, decreased activity tolerance, decreased endurance, decreased mobility, difficulty walking, decreased ROM, decreased strength, and pain.   ACTIVITY LIMITATIONS: bending, standing, squatting, sleeping, transfers, and bed mobility  PARTICIPATION LIMITATIONS: cleaning, community activity, and yard work  [facility] POTENTIAL: Good  CLINICAL DECISION MAKING: Stable/uncomplicated  EVALUATION COMPLEXITY: Low   GOALS: Goals reviewed with patient? No  SHORT TERM GOALS: Target date: 01/21/24  Patient will be independent with performance of HEP to demonstrate adequate self management of symptoms.  Baseline:  Goal status: INITIAL  2.   Patient will report at least a 25% improvement with function or pain overall since beginning PT. Baseline:  Goal status: INITIAL  LONG TERM GOALS: Target date: 02/18/24  Patient will improve LEFS score by at least  20  points in order to improve self-perceived disability and overall function.  Baseline: Goal status: INITIAL  2.  Patient will improve  2 MW test by at least 61ft  in order to display improved LE strength and endurance to improve gait. Baseline:  Goal status: INITIAL  3.  Patient will gain at least  10 deg of PROM in  L hip IR  in order to improve hip mobility in order to decrease pain.  Baseline:  Goal status: INITIAL   5.  Patient will report overall 50% improvement since beginning PT. Baseline:  Goal status:  INITIAL  PLAN:  PT FREQUENCY: 2x/week  PT DURATION: 6 Punt  PLANNED INTERVENTIONS: [id number]- PT Re-evaluation, 97110-Therapeutic exercises, 97530- Therapeutic activity, 97112- Neuromuscular re-education, 97535- Self Care, 54098- Manual therapy, [phone]- Gait training, [phone]- Traction (mechanical), Patient/Family education, Balance training, Stair training, Joint mobilization, and Spinal mobilization.  PLAN FOR NEXT SESSION: Progress core strengthening, Progress hip ROM activity, intro LE strengthening, postural exercises   5:56 PM, 01/07/24 Hamsini Verrilli Powell-Butler, PT, DPT [facility] [place]

## 2024-01-10 DIAGNOSIS — R2689 Other abnormalities of gait and mobility: Secondary | ICD-10-CM | POA: Diagnosis not present

## 2024-01-10 DIAGNOSIS — S32402D Unspecified fracture of left acetabulum, subsequent encounter for fracture with routine healing: Secondary | ICD-10-CM | POA: Diagnosis not present

## 2024-01-11 ENCOUNTER — Ambulatory Visit (HOSPITAL_COMMUNITY)
Admission: RE | Admit: 2024-01-11 | Discharge: 2024-01-11 | Disposition: A | Discharge status: Home / Self Care | Source: Ambulatory Visit | Attending: Internal Medicine | Admitting: Internal Medicine

## 2024-01-11 DIAGNOSIS — Z1231 Encounter for screening mammogram for malignant neoplasm of breast: Secondary | ICD-10-CM | POA: Diagnosis not present

## 2024-01-15 ENCOUNTER — Ambulatory Visit (HOSPITAL_COMMUNITY): Admitting: Physical Therapy

## 2024-01-15 DIAGNOSIS — R29898 Other symptoms and signs involving the musculoskeletal system: Secondary | ICD-10-CM

## 2024-01-15 DIAGNOSIS — M4856XA Collapsed vertebra, not elsewhere classified, lumbar region, initial encounter for fracture: Secondary | ICD-10-CM | POA: Diagnosis not present

## 2024-01-15 DIAGNOSIS — M5459 Other low back pain: Secondary | ICD-10-CM | POA: Diagnosis not present

## 2024-01-15 NOTE — Therapy (Signed)
 OUTPATIENT PHYSICAL THERAPY THORACOLUMBAR TREATMENT   Patient Name: Kristina [name] MRN: [id number] DOB:1950/10/02, 72 y.o., female Today's Date: 01/15/2024  END OF SESSION:  PT End of Session - 01/15/24 1106     Visit Number 2    Number of Visits 12    Date for PT Re-Evaluation 02/04/24    Authorization Type [employer]    Authorization Time Period No auth required    Progress Note Due on Visit 10    PT Start Time 1104    PT Stop Time 1143    PT Time Calculation (min) 39 min    Activity Tolerance Patient tolerated treatment well    Behavior During Therapy WFL for tasks assessed/performed              Past Medical History:  Diagnosis Date   Acute respiratory distress syndrome (ARDS) due to COVID-19 virus (HCC) 09/2019   Chronic combined systolic and diastolic CHF (congestive heart failure) (HCC)    Diabetes mellitus, type 2 (HCC)    Dyspnea    with exertion   GERD (gastroesophageal reflux disease)    Hyperlipidemia    Hypertension    Sleep apnea    Past Surgical History:  Procedure Laterality Date   COLONOSCOPY N/A 12/21/2016   Procedure: COLONOSCOPY;  Surgeon: Ruby Corporal, MD;  Location: [facility];  Service: Endoscopy;  Laterality: N/A;  930   KYPHOPLASTY N/A 11/13/2023   Procedure: - Thoracic eleven - Lumbar one kyphoplasty;  Surgeon: Van Gelinas, MD;  Location: [facility] OR;  Service: Neurosurgery;  Laterality: N/A;   ORIF ACETABULAR FRACTURE Left 03/16/2023   Procedure: OPEN REDUCTION INTERNAL FIXATION (ORIF) ACETABULAR FRACTURE STOPPA APPROACH;  Surgeon: Laneta Pintos, MD;  Location: [facility] OR;  Service: Orthopedics;  Laterality: Left;   Patient Active Problem List   Diagnosis Date Noted   Class 2 severe obesity due to excess calories with serious comorbidity and body mass index (BMI) of 38.0 to 38.9 in adult [facility]) 10/25/2023   Acute exacerbation of congestive heart failure (HCC) 10/23/2023   Acute on chronic respiratory failure with hypoxia (HCC)  10/23/2023   Unsteady gait when walking 09/05/2023   Low back pain 09/05/2023   Hypernatremia 09/05/2023   Chronic constipation 05/10/2023   Hemorrhoids 05/10/2023   Closed fracture of right acetabulum (HCC) 05/02/2023   Acetabular fracture (HCC) 03/14/2023   Candidiasis of skin 02/12/2023   Osteoporosis 01/22/2023   Proteinuria 06/15/2022   Obstructive sleep apnea 09/08/2021   Congestive heart failure (HCC) 07/29/2021   Hyperglycemia due to type 2 diabetes mellitus (HCC) 05/04/2021   Chronic systolic heart failure (HCC) 04/09/2021   Hypoxia    Acute hypoxemic respiratory failure (HCC) 04/05/2021   SOB (shortness of breath) 04/05/2021   Tremor 04/05/2021   Impaired fasting glucose 01/25/2021   Gastroesophageal reflux disease without esophagitis 01/25/2021   Mixed hyperlipidemia 01/25/2021   Morbid obesity (HCC) 01/25/2021   Prediabetes 01/25/2021   Tachycardia 01/25/2021   Pain in left knee 05/20/2020   Essential hypertension 10/08/2019   Uncontrolled diabetes mellitus 10/08/2019   UTI (urinary tract infection) 10/08/2019   Thrush 10/08/2019   Pneumonia due to COVID-19 virus 10/08/2019   Chronic respiratory failure with hypoxia (HCC) 10/02/2019   Acute respiratory distress syndrome (ARDS) due to COVID-19 virus (HCC) 10/02/2019   Pain in joint of right shoulder 07/15/2018   Pain in right foot 07/15/2018   Special screening for malignant neoplasms, colon 11/06/2016    PCP: Omie Bickers, MD  REFERRING PROVIDER: Tomlinson, Sara Caylin, PA-C  REFERRING DIAG:  F62.130Q (ICD-10-CM) - Wedge compression fracture of first lumbar vertebra, initial encounter for closed fracture    Rationale for Evaluation and Treatment: Rehabilitation  THERAPY DIAG:  Other low back pain  Weakness of lower extremity, unspecified laterality  Nontraumatic compression fracture of L1 vertebra, initial encounter [facility])  ONSET DATE: June 2024  SUBJECTIVE:                                                                                                                                                                                            SUBJECTIVE STATEMENT: Pt reports 3-4 pain.  Most stiffness when begins to walk and when getting up first thing in the morning. "Legs just feel weak".  Reports she goes to [facility] on Tuesday and Thursdays to a seated/standing exercise class.   Evaluation: Patient reports she fell and broke Left hip last year after stepping on a curb but a few months ago her back started hurting. They found compression fractures. Filled them with cement. And that pain has gotten better, She's been sleeping in recliner because its hard to get into higher bed. Her legs just feel weak. Sleeps on her side and has adjustable bed. STS is painful. Uses [employer] for long distance. (I) with iADLs and ADLs. Still driving  PERTINENT HISTORY:  ORIF Left hip summer 2024  PAIN:  Are you having pain? No  PRECAUTIONS: None  RED FLAGS: Bowel or bladder incontinence: Yes: Seeing doctor about it next week    WEIGHT BEARING RESTRICTIONS: No  FALLS:  Has patient fallen in last 6 months? No  LIVING ENVIRONMENT: Stairs: Yes: External: 3 steps; on right going up, on left going up, and can reach both Has following equipment at home: Single point cane, Walker - 2 wheeled, and bed side commode  OCCUPATION: Retired  PLOF: Independent  PATIENT GOALS: "Just hoping to strengthen legs"  NEXT MD VISIT: Next week   OBJECTIVE:  Note: Objective measures were completed at Evaluation unless otherwise noted.  DIAGNOSTIC FINDINGS:  COMPARISON:  08/06/2022   FINDINGS: Segmentation:  5 lumbar type vertebrae   Alignment:  Mild scoliosis   Vertebrae: Essentially resolved marrow edema at the level of L2 compression fracture seen on prior. Height loss did progress eccentric towards the left, over 50% at this level where there is also mild retropulsion. There is now marrow edema and  compression deformity at the T11 and L1 vertebral bodies with a horizontal hypointense fracture plane best seen at L1. No evidence of aggressive bone lesion.   Conus medullaris and cauda equina: Conus extends to the  T12-L1 level. Conus and cauda equina appear normal.   Paraspinal and other soft tissues: Renal hilar and renal cortical cysts. No follow-up imaging recommended   Disc levels:   T12- L1: Disc space narrowing and circumferential bulging. Negative facets.   L1-L2: Disc space narrowing and bulging. Degenerative facet spurring asymmetric to the left. Mild left foraminal narrowing   L2-L3: Disc narrowing and bulging.  Negative facets.   L3-L4: Disc narrowing and bulging.  Mild facet spurring.   L4-L5: Disc collapse with endplate ridging and facet spurring centric to the right. Right foraminal protrusion and foraminal impingement to a moderate degree on sagittal images.   L5-S1:Moderate degenerative facet spurring with mild anterolisthesis. No herniation or impingement.   IMPRESSION: 1. Compression fractures at T11 and L1 which are acute or subacute. Height loss is mild at both levels and there is no retropulsion. 2. Interval healing of L2 compression fracture seen on 2023 comparison. Height loss progressed in the left vertebral body since prior. 3. Degenerative foraminal impingement on the right at L4-5  PATIENT SURVEYS:  Lower Extremity Functional Score: 29 / 80 = 36.3 %  COGNITION: Overall cognitive status: Within functional limits for tasks assessed     SENSATION: WFL  MUSCLE LENGTH: Hamstrings: Right WFL deg; Left WFL deg Modified Thomas test: Tightness noted bilat, L>R  POSTURE:  Kyphotic  PALPATION:   LUMBAR ROM:   AROM eval  Flexion Mid shin, painful  Extension 50%  Right lateral flexion   Left lateral flexion   Right rotation   Left rotation    (Blank rows = not tested)  LOWER EXTREMITY ROM:     Passive  Right eval Left eval  Hip  flexion    Hip extension    Hip abduction    Hip adduction    Hip internal rotation ~30 ~20  Hip external rotation ~40 ~40  Knee flexion    Knee extension    Ankle dorsiflexion    Ankle plantarflexion    Ankle inversion    Ankle eversion     (Blank rows = not tested)  LOWER EXTREMITY MMT:    MMT Right eval Left eval  Hip flexion 4- 3+, painful  Hip extension    Hip abduction    Hip adduction    Hip internal rotation    Hip external rotation    Knee flexion 4 4-  Knee extension 4 4-  Ankle dorsiflexion 5 5  Ankle plantarflexion    Ankle inversion    Ankle eversion     (Blank rows = not tested)  LUMBAR SPECIAL TESTS:    FUNCTIONAL TESTS:  5 times sit to stand: 12", SOB afterwards 2 minute walk test: 325', reports LE feel weak,   GAIT: Distance walked: 325 Assistive device utilized: None Level of assistance: Complete Independence Comments: Dec stance on R causing limp, dec arm swing with RUE, dec hip ext bilat, dec active DF bilat (R>L) causing mild hip hike  TREATMENT DATE:  01/15/24 Seated:  HEP review/goals  Sit to stands no UE 10X Supine:  TA iso 10X5"  Bridge 10X  SLR 10X each  LTR 10X each  Knee to chest with towel 4X20" each   01/07/24:  PT Evaluation and HEP admin  PATIENT EDUCATION:  Education details: PT evaluation, objective findings, POC, Importance of HEP, Precautions, Clinic policies Person educated: Patient Education method: Explanation and Demonstration Education comprehension: verbalized understanding and returned demonstration  HOME EXERCISE PROGRAM: Access Code: [email or web address] URL: [email or web address] Date: 01/07/2024 Prepared by: Virgia Griffins Powell-Butler  Exercises - Supine Bilateral Hip Internal Rotation Stretch  - 2 x daily - 7 x weekly - 2 sets - 10 reps - Supine Transversus Abdominis Bracing -  Hands on Stomach  - 2 x daily - 7 x weekly - 2 sets - 10 reps - 5 hold  Date: 01/15/2024 - Sit to Stand  - 2 x daily - 7 x weekly - 2 sets - 10 reps - Beginner Bridge  - 2 x daily - 7 x weekly - 2 sets - 10 reps - Supine Pelvic Tilt with Straight Leg Raise  - 2 x daily - 7 x weekly - 2 sets - 10 reps - Supine Lower Trunk Rotation  - 2 x daily - 7 x weekly - 2 sets - 10 reps - 5 sec hold - Supine Single Knee to Chest Stretch  - 2 x daily - 7 x weekly - 2 sets - 3 reps - 20-30 sec hold   ASSESSMENT:  CLINICAL IMPRESSION: Reviewed goals and POC moving forward.  Pt still having most challenge with stiffness in the morning and leg weakness.  Suggested she try doing her exercises before she gets out of bed to see if this helps.  Reviewed HEP exercises with minimal cues completing correctly.  Progressed these exercises with noted challenge to LE's and core.  Updated HEP to include these.  PT will continue to benefit from skilled therapy to progress towards goals.    Evaluation:  Patient is a 73 y.o. female  who was seen today for physical therapy evaluation and treatment for  S32.010A (ICD-10-CM) - Wedge compression fracture of first lumbar vertebra, initial encounter for closed fracture  Patient demonstrates decreased LE strength and passive ROM, decreased active lumbar ROM, and decreased endurance which may be contributing to her altered gait mechanics, decreased activity tolerance, and overall pain. Patient reporting her main concern as LE weakness as her back pain is not as consistent. Patient educated on continuing to avoid forward flexion as precaution of her compression fractures. HEP given today to begin core strengthening and hip ROM. Patient will benefit from skilled physical therapy in order to address the above to improve her function and QOL.    OBJECTIVE IMPAIRMENTS: Abnormal gait, decreased activity tolerance, decreased endurance, decreased mobility, difficulty walking, decreased ROM,  decreased strength, and pain.   ACTIVITY LIMITATIONS: bending, standing, squatting, sleeping, transfers, and bed mobility  PARTICIPATION LIMITATIONS: cleaning, community activity, and yard work  [facility] POTENTIAL: Good  CLINICAL DECISION MAKING: Stable/uncomplicated  EVALUATION COMPLEXITY: Low   GOALS: Goals reviewed with patient? Yes  SHORT TERM GOALS: Target date: 01/21/24  Patient will be independent with performance of HEP to demonstrate adequate self management of symptoms.  Baseline:  Goal status: INITIAL  2.   Patient will report at least a 25% improvement with function or pain overall since beginning PT. Baseline:  Goal status: INITIAL  LONG TERM GOALS: Target date: 02/18/24  Patient will improve LEFS score by at least  20  points in order to improve self-perceived disability and overall function.  Baseline: Goal status: INITIAL  2.  Patient will improve  2 MW test by at least 73ft  in order to display improved LE strength and endurance  to improve gait. Baseline:  Goal status: INITIAL  3.  Patient will gain at least  10 deg of PROM in  L hip IR  in order to improve hip mobility in order to decrease pain.  Baseline:  Goal status: INITIAL   5.  Patient will report overall 50% improvement since beginning PT. Baseline:  Goal status: INITIAL  PLAN:  PT FREQUENCY: 2x/week  PT DURATION: 6 [place]  PLANNED INTERVENTIONS: 97164- PT Re-evaluation, 97110-Therapeutic exercises, 97530- Therapeutic activity, 97112- Neuromuscular re-education, 97535- Self Care, 28413- Manual therapy, [phone]- Gait training, [phone]- Traction (mechanical), Patient/Family education, Balance training, Stair training, Joint mobilization, and Spinal mobilization.  PLAN FOR NEXT SESSION: Progress core strengthening, Progress hip ROM activity, intro LE strengthening, postural exercises   11:06 AM, 01/15/24 Lorenso Romance, PTA/CLT [facility] Ph:  [phone]

## 2024-01-17 DIAGNOSIS — N814 Uterovaginal prolapse, unspecified: Secondary | ICD-10-CM | POA: Diagnosis not present

## 2024-01-18 ENCOUNTER — Ambulatory Visit (HOSPITAL_COMMUNITY)

## 2024-01-18 ENCOUNTER — Encounter (HOSPITAL_COMMUNITY): Payer: Self-pay

## 2024-01-18 DIAGNOSIS — M4856XA Collapsed vertebra, not elsewhere classified, lumbar region, initial encounter for fracture: Secondary | ICD-10-CM | POA: Diagnosis not present

## 2024-01-18 DIAGNOSIS — M5459 Other low back pain: Secondary | ICD-10-CM | POA: Diagnosis not present

## 2024-01-18 DIAGNOSIS — R29898 Other symptoms and signs involving the musculoskeletal system: Secondary | ICD-10-CM | POA: Diagnosis not present

## 2024-01-18 NOTE — Therapy (Signed)
 OUTPATIENT PHYSICAL THERAPY THORACOLUMBAR TREATMENT   Patient Name: Kristina [name] MRN: [id number] DOB:11-Feb-1951, 73 y.o., female Today's Date: 01/18/2024  END OF SESSION:  PT End of Session - 01/18/24 1518     Visit Number 3    Number of Visits 12    Date for PT Re-Evaluation 02/04/24    Authorization Type [employer]    Authorization Time Period No auth required    Progress Note Due on Visit 10    PT Start Time 1519    PT Stop Time 1600    PT Time Calculation (min) 41 min    Activity Tolerance Patient tolerated treatment well    Behavior During Therapy WFL for tasks assessed/performed              Past Medical History:  Diagnosis Date   Acute respiratory distress syndrome (ARDS) due to COVID-19 virus (HCC) 09/2019   Chronic combined systolic and diastolic CHF (congestive heart failure) (HCC)    Diabetes mellitus, type 2 (HCC)    Dyspnea    with exertion   GERD (gastroesophageal reflux disease)    Hyperlipidemia    Hypertension    Sleep apnea    Past Surgical History:  Procedure Laterality Date   COLONOSCOPY N/A 12/21/2016   Procedure: COLONOSCOPY;  Surgeon: Ruby Corporal, MD;  Location: [facility];  Service: Endoscopy;  Laterality: N/A;  930   KYPHOPLASTY N/A 11/13/2023   Procedure: - Thoracic eleven - Lumbar one kyphoplasty;  Surgeon: Van Gelinas, MD;  Location: [facility] OR;  Service: Neurosurgery;  Laterality: N/A;   ORIF ACETABULAR FRACTURE Left 03/16/2023   Procedure: OPEN REDUCTION INTERNAL FIXATION (ORIF) ACETABULAR FRACTURE STOPPA APPROACH;  Surgeon: Laneta Pintos, MD;  Location: [facility] OR;  Service: Orthopedics;  Laterality: Left;   Patient Active Problem List   Diagnosis Date Noted   Class 2 severe obesity due to excess calories with serious comorbidity and body mass index (BMI) of 38.0 to 38.9 in adult [facility]) 10/25/2023   Acute exacerbation of congestive heart failure (HCC) 10/23/2023   Acute on chronic respiratory failure with hypoxia (HCC)  10/23/2023   Unsteady gait when walking 09/05/2023   Low back pain 09/05/2023   Hypernatremia 09/05/2023   Chronic constipation 05/10/2023   Hemorrhoids 05/10/2023   Closed fracture of right acetabulum (HCC) 05/02/2023   Acetabular fracture (HCC) 03/14/2023   Candidiasis of skin 02/12/2023   Osteoporosis 01/22/2023   Proteinuria 06/15/2022   Obstructive sleep apnea 09/08/2021   Congestive heart failure (HCC) 07/29/2021   Hyperglycemia due to type 2 diabetes mellitus (HCC) 05/04/2021   Chronic systolic heart failure (HCC) 04/09/2021   Hypoxia    Acute hypoxemic respiratory failure (HCC) 04/05/2021   SOB (shortness of breath) 04/05/2021   Tremor 04/05/2021   Impaired fasting glucose 01/25/2021   Gastroesophageal reflux disease without esophagitis 01/25/2021   Mixed hyperlipidemia 01/25/2021   Morbid obesity (HCC) 01/25/2021   Prediabetes 01/25/2021   Tachycardia 01/25/2021   Pain in left knee 05/20/2020   Essential hypertension 10/08/2019   Uncontrolled diabetes mellitus 10/08/2019   UTI (urinary tract infection) 10/08/2019   Thrush 10/08/2019   Pneumonia due to COVID-19 virus 10/08/2019   Chronic respiratory failure with hypoxia (HCC) 10/02/2019   Acute respiratory distress syndrome (ARDS) due to COVID-19 virus (HCC) 10/02/2019   Pain in joint of right shoulder 07/15/2018   Pain in right foot 07/15/2018   Special screening for malignant neoplasms, colon 11/06/2016    PCP: Omie Bickers, MD  REFERRING PROVIDER: Tomlinson, Sara Caylin, PA-C  REFERRING DIAG:  G29.528U (ICD-10-CM) - Wedge compression fracture of first lumbar vertebra, initial encounter for closed fracture    Rationale for Evaluation and Treatment: Rehabilitation  THERAPY DIAG:  Other low back pain  Weakness of lower extremity, unspecified laterality  ONSET DATE: June 2024  SUBJECTIVE:                                                                                                                                                                                            SUBJECTIVE STATEMENT: Reports she is very stiff in the morning.  Legs feel weak.  Reports compliance with HEP daily.  Current pain scale 4/10 groin pain.  Evaluation: Patient reports she fell and broke Left hip last year after stepping on a curb but a few months ago her back started hurting. They found compression fractures. Filled them with cement. And that pain has gotten better, She's been sleeping in recliner because its hard to get into higher bed. Her legs just feel weak. Sleeps on her side and has adjustable bed. STS is painful. Uses [employer] for long distance. (I) with iADLs and ADLs. Still driving  PERTINENT HISTORY:  ORIF Left hip summer 2024  PAIN:  Are you having pain? No  PRECAUTIONS: None  RED FLAGS: Bowel or bladder incontinence: Yes: Seeing doctor about it next week    WEIGHT BEARING RESTRICTIONS: No  FALLS:  Has patient fallen in last 6 months? No  LIVING ENVIRONMENT: Stairs: Yes: External: 3 steps; on right going up, on left going up, and can reach both Has following equipment at home: Single point cane, Walker - 2 wheeled, and bed side commode  OCCUPATION: Retired  PLOF: Independent  PATIENT GOALS: "Just hoping to strengthen legs"  NEXT MD VISIT: Next week   OBJECTIVE:  Note: Objective measures were completed at Evaluation unless otherwise noted.  DIAGNOSTIC FINDINGS:  COMPARISON:  08/06/2022   FINDINGS: Segmentation:  5 lumbar type vertebrae   Alignment:  Mild scoliosis   Vertebrae: Essentially resolved marrow edema at the level of L2 compression fracture seen on prior. Height loss did progress eccentric towards the left, over 50% at this level where there is also mild retropulsion. There is now marrow edema and compression deformity at the T11 and L1 vertebral bodies with a horizontal hypointense fracture plane best seen at L1. No evidence of aggressive bone lesion.   Conus  medullaris and cauda equina: Conus extends to the T12-L1 level. Conus and cauda equina appear normal.   Paraspinal and other soft tissues: Renal hilar and renal cortical cysts. No follow-up imaging recommended  Disc levels:   T12- L1: Disc space narrowing and circumferential bulging. Negative facets.   L1-L2: Disc space narrowing and bulging. Degenerative facet spurring asymmetric to the left. Mild left foraminal narrowing   L2-L3: Disc narrowing and bulging.  Negative facets.   L3-L4: Disc narrowing and bulging.  Mild facet spurring.   L4-L5: Disc collapse with endplate ridging and facet spurring centric to the right. Right foraminal protrusion and foraminal impingement to a moderate degree on sagittal images.   L5-S1:Moderate degenerative facet spurring with mild anterolisthesis. No herniation or impingement.   IMPRESSION: 1. Compression fractures at T11 and L1 which are acute or subacute. Height loss is mild at both levels and there is no retropulsion. 2. Interval healing of L2 compression fracture seen on 2023 comparison. Height loss progressed in the left vertebral body since prior. 3. Degenerative foraminal impingement on the right at L4-5  PATIENT SURVEYS:  Lower Extremity Functional Score: 29 / 80 = 36.3 %  COGNITION: Overall cognitive status: Within functional limits for tasks assessed     SENSATION: WFL  MUSCLE LENGTH: Hamstrings: Right WFL deg; Left WFL deg Modified Thomas test: Tightness noted bilat, L>R  POSTURE:  Kyphotic  PALPATION:   LUMBAR ROM:   AROM eval  Flexion Mid shin, painful  Extension 50%  Right lateral flexion   Left lateral flexion   Right rotation   Left rotation    (Blank rows = not tested)  LOWER EXTREMITY ROM:     Passive  Right eval Left eval  Hip flexion    Hip extension    Hip abduction    Hip adduction    Hip internal rotation ~30 ~20  Hip external rotation ~40 ~40  Knee flexion    Knee extension    Ankle  dorsiflexion    Ankle plantarflexion    Ankle inversion    Ankle eversion     (Blank rows = not tested)  LOWER EXTREMITY MMT:    MMT Right eval Left eval  Hip flexion 4- 3+, painful  Hip extension    Hip abduction    Hip adduction    Hip internal rotation    Hip external rotation    Knee flexion 4 4-  Knee extension 4 4-  Ankle dorsiflexion 5 5  Ankle plantarflexion    Ankle inversion    Ankle eversion     (Blank rows = not tested)  LUMBAR SPECIAL TESTS:    FUNCTIONAL TESTS:  5 times sit to stand: 12", SOB afterwards 2 minute walk test: 325', reports LE feel weak,   GAIT: Distance walked: 325 Assistive device utilized: None Level of assistance: Complete Independence Comments: Dec stance on R causing limp, dec arm swing with RUE, dec hip ext bilat, dec active DF bilat (R>L) causing mild hip hike  TREATMENT DATE:  01/18/24: Nustep [place] trail x with UE/LE seat 4  Sidelying: Clam with YTB 2x 10 3" holds  Supine: (Pt able to recall bridge, SLR, LTR, SKTC as HEP compliance) Butterfly stretch 3x30" Bridge with YTB around thighs 10x 3" Marching with ab set 10x 3"  STS no UE 10x   Standing: Lumbar extension 5x Rotation 5x each   01/15/24 Seated:  HEP review/goals  Sit to stands no UE 10X Supine:  TA iso 10X5"  Bridge 10X  SLR 10X each  LTR 10X each  Knee to chest with towel 4X20" each   01/07/24:  PT Evaluation and HEP admin  PATIENT EDUCATION:  Education details: PT evaluation, objective findings, POC, Importance of HEP, Precautions, Clinic policies Person educated: Patient Education method: Explanation and Demonstration Education comprehension: verbalized understanding and returned demonstration  HOME EXERCISE PROGRAM: Access Code: [email or web address] URL: [email or web address] Date: 01/07/2024 Prepared by:  Virgia Griffins Powell-Butler  Exercises - Supine Bilateral Hip Internal Rotation Stretch  - 2 x daily - 7 x weekly - 2 sets - 10 reps - Supine Transversus Abdominis Bracing - Hands on Stomach  - 2 x daily - 7 x weekly - 2 sets - 10 reps - 5 hold  Date: 01/15/2024 - Sit to Stand  - 2 x daily - 7 x weekly - 2 sets - 10 reps - Beginner Bridge  - 2 x daily - 7 x weekly - 2 sets - 10 reps - Supine Pelvic Tilt with Straight Leg Raise  - 2 x daily - 7 x weekly - 2 sets - 10 reps - Supine Lower Trunk Rotation  - 2 x daily - 7 x weekly - 2 sets - 10 reps - 5 sec hold - Supine Single Knee to Chest Stretch  - 2 x daily - 7 x weekly - 2 sets - 3 reps - 20-30 sec hold  01/18/24 - Clam with Resistance  - 2 x daily - 7 x weekly - 1 sets - 10 reps - 3-5" hold - Supine Butterfly Groin Stretch  - 2 x daily - 7 x weekly - 1 sets - 3 reps - 30" hold   ASSESSMENT:  CLINICAL IMPRESSION: Session focus with mobility and proximal strengthening.  Reports of stiffness at beginning of session, began with Nustep for dynamic warm up.  Session focus with core and proximal strengthening and stretches for mobility.  Added clams for gluteal strengthening and marching for core strengthening as well as butterfly stretch for groin tightness.  Pt tolerated well to session.  Cueing with STS to reduce knee valgus for increased gluteal activation.    Reviewed goals and POC moving forward.  Pt still having most challenge with stiffness in the morning and leg weakness.  Suggested she try doing her exercises before she gets out of bed to see if this helps.  Reviewed HEP exercises with minimal cues completing correctly.  Progressed these exercises with noted challenge to LE's and core.  Updated HEP to include these.  PT will continue to benefit from skilled therapy to progress towards goals.    Evaluation:  Patient is a 73 y.o. female  who was seen today for physical therapy evaluation and treatment for  S32.010A (ICD-10-CM) - Wedge  compression fracture of first lumbar vertebra, initial encounter for closed fracture  Patient demonstrates decreased LE strength and passive ROM, decreased active lumbar ROM, and decreased endurance which may be contributing to her altered gait mechanics, decreased activity tolerance, and overall pain. Patient reporting her main concern as LE weakness as her back pain is not as consistent. Patient educated on continuing to avoid forward flexion as precaution of her compression fractures. HEP given today to begin core strengthening and hip ROM. Patient will benefit from skilled physical therapy in order to address the above to improve her function and QOL.    OBJECTIVE IMPAIRMENTS: Abnormal gait, decreased activity tolerance, decreased endurance, decreased mobility, difficulty walking, decreased ROM, decreased strength, and pain.   ACTIVITY LIMITATIONS: bending, standing, squatting, sleeping, transfers, and bed mobility  PARTICIPATION LIMITATIONS: cleaning, community activity, and yard work  [facility] POTENTIAL: Good  CLINICAL DECISION MAKING: Stable/uncomplicated  EVALUATION COMPLEXITY:  Low   GOALS: Goals reviewed with patient? Yes  SHORT TERM GOALS: Target date: 01/21/24  Patient will be independent with performance of HEP to demonstrate adequate self management of symptoms.  Baseline:  Goal status: INITIAL  2.   Patient will report at least a 25% improvement with function or pain overall since beginning PT. Baseline:  Goal status: INITIAL  LONG TERM GOALS: Target date: 02/18/24  Patient will improve LEFS score by at least  20  points in order to improve self-perceived disability and overall function.  Baseline: Goal status: INITIAL  2.  Patient will improve  2 MW test by at least 56ft  in order to display improved LE strength and endurance to improve gait. Baseline:  Goal status: INITIAL  3.  Patient will gain at least  10 deg of PROM in  L hip IR  in order to improve hip mobility  in order to decrease pain.  Baseline:  Goal status: INITIAL   5.  Patient will report overall 50% improvement since beginning PT. Baseline:  Goal status: INITIAL  PLAN:  PT FREQUENCY: 2x/week  PT DURATION: 6 [place]  PLANNED INTERVENTIONS: 97164- PT Re-evaluation, 97110-Therapeutic exercises, 97530- Therapeutic activity, 97112- Neuromuscular re-education, 97535- Self Care, 81191- Manual therapy, [phone]- Gait training, [id number]- Traction (mechanical), Patient/Family education, Balance training, Stair training, Joint mobilization, and Spinal mobilization.  PLAN FOR NEXT SESSION: Progress core strengthening, Progress hip ROM activity, intro LE strengthening, postural exercises.  Next session progress to standing exercises.  Minor Amble, LPTA/CLT; CBIS [phone]  4:07 PM, 01/18/24

## 2024-01-21 ENCOUNTER — Encounter (HOSPITAL_COMMUNITY)

## 2024-01-22 ENCOUNTER — Ambulatory Visit: Payer: Medicare HMO | Admitting: Primary Care

## 2024-01-22 ENCOUNTER — Telehealth: Payer: Self-pay | Admitting: Primary Care

## 2024-01-22 ENCOUNTER — Encounter: Payer: Self-pay | Admitting: Primary Care

## 2024-01-22 VITALS — BP 143/75 | HR 88 | Temp 98.4°F | Ht <= 58 in | Wt 183.4 lb

## 2024-01-22 DIAGNOSIS — J849 Interstitial pulmonary disease, unspecified: Secondary | ICD-10-CM

## 2024-01-22 DIAGNOSIS — I5031 Acute diastolic (congestive) heart failure: Secondary | ICD-10-CM | POA: Diagnosis not present

## 2024-01-22 DIAGNOSIS — G4733 Obstructive sleep apnea (adult) (pediatric): Secondary | ICD-10-CM

## 2024-01-22 DIAGNOSIS — J986 Disorders of diaphragm: Secondary | ICD-10-CM

## 2024-01-22 DIAGNOSIS — J9611 Chronic respiratory failure with hypoxia: Secondary | ICD-10-CM

## 2024-01-22 DIAGNOSIS — E662 Morbid (severe) obesity with alveolar hypoventilation: Secondary | ICD-10-CM

## 2024-01-22 DIAGNOSIS — R0602 Shortness of breath: Secondary | ICD-10-CM

## 2024-01-22 NOTE — Telephone Encounter (Signed)
 Are you able to schedule time to do DISE procedure for [facility] qualification. Patient has moderate-severe OSA, intolerant to CPAP. HST 07/20/21.  BMI 38.

## 2024-01-22 NOTE — Patient Instructions (Addendum)
  YOUR PLAN: -OBSTRUCTIVE SLEEP APNEA: Obstructive sleep apnea is a condition where your airway becomes blocked during sleep, causing breathing interruptions. Since you are having trouble with CPAP therapy, we are considering an alternative treatment called Inspire, which is a surgically implanted device that helps keep your airway open. We will verify if your sleep study is current for insurance purposes, and if needed, repeat the study. A sleep endoscopy will be scheduled to check your airway during sleep, and you will be referred to an ENT specialist for further evaluation if you qualify for Inspire.  -OBESITY HYPOVENTILATION SYNDROME: Obesity hypoventilation syndrome is a condition where excess weight affects your breathing, leading to low oxygen  levels. Your BMI is 38, indicating obesity, and weight loss could help improve your symptoms. We recommend a walking test to qualify you for a home oxygen  concentrator, which will help manage your oxygen  levels during exertion and as needed.  -MODERATE RESTRICTIVE LUNG DISEASE: Moderate restrictive lung disease means your lungs are not expanding fully, which can be due to various factors including obesity. We will order a high-resolution CT scan of your chest to check for any fibrosis or other abnormalities.  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 This was confirmed by a sniff test. No surgical intervention is planned at this time.  -MILD DIASTOLIC DYSFUNCTION: Mild diastolic dysfunction means your heart's left ventricle has trouble relaxing, which can affect how your heart fills with blood. Your recent echocardiogram showed normal heart function with mild left ventricular hypertrophy. Continue management with your cardiologist, Dr. Amanda Jungling.  INSTRUCTIONS: 1. Verify if your sleep study is within the required timeframe for insurance coverage of  Inspire. If necessary, repeat the home sleep study. 2. Schedule a sleep endoscopy to assess airway closure during sleep. 3. Refer to an ENT specialist for preoperative evaluation if qualified for Inspire. 4. Conduct a walking test to qualify for a home oxygen  concentrator through insurance. 5. Order a home oxygen  concentrator for use during exertion and as needed. 6. Order a high-resolution CT scan of the chest to assess for fibrosis or other abnormalities. 7. Continue management with your cardiologist, Dr. Amanda Jungling.  Follow-up 3 months with Jerlene Moody NP

## 2024-01-22 NOTE — Progress Notes (Signed)
 @Patient  ID: Kristina [name], female    DOB: 12-08-1950, 73 y.o.   MRN: [id number]  No chief complaint on file.   Referring provider: Omie Bickers, MD  HPI: 73 year old female, never smoked. PMH significant for snoring, HTN, HFpEF, covid pneumonia, diabetes mellitus. Patient of Dr. Matilde Son.   Previous [facility] pulmonary encounter:  She is slowly getting used to wearing CPAP.  Doesn't like full face mask.  Pressure is okay.  She purchased home oxygen , but isn't using currently.  Sleeping better, and feels more alert.  07/24/2023 Patient presents today for OSA follow-up. She was last seen in July 2023 by Dr. Matilde Son, ordered for mask fitting. HST on 07/20/21 showed moderate-severe OSA with severe oxygen  desaturation; AHI 29.6/hr with SpO2 low 38% with an average of 75%.   She continues to struggle with CPAP compliance. No issues with pressure settings or mask fit. She denies trouble sleeping at night while wearing, she will take her mask off when she wakes up to use the restroom and not put it back on. She is using a full face mask. Current pressure settings 5-20cm h20. She has a [employer] jewellery that she purchased on her down but is no longer using. DME is [employer].   Airview download 06/23/23-07/22/23 Usage days 28/30 days (93%); 2 days (7%) Average usage days used 2 hours 7 minutes Pressure 5 to 20 cm H2O (8.7 cm H2O-95%) Air leaks 42L/min (95%)  AHI 1.0   01/22/2024- Interim hx  Discussed the use of AI scribe software for clinical note transcription with the patient, who gave verbal consent to proceed.  History of Present Illness   Kristina [name] is a 73 year old female with moderate to severe sleep apnea who presents with difficulty tolerating CPAP therapy.  She was diagnosed with moderate to severe sleep apnea in October 2022, with a sleep study showing an apnea-hypopnea index (AHI) of 20 and oxygen  levels dropping as low as 38%, with an average of 75%. She struggles with CPAP compliance,  often removing the mask during the night due to symptoms such as a cough, rhinorrhea, and diaphoresis, which contribute to her difficulty in using the device consistently.  She has a history of COVID-19 in 2021, which she associates with the onset of her dyspnea. She experiences dyspnea during exertion, such as walking, and uses a portable oxygen  concentrator at home which she purchased on her own. Her oxygen  levels drop but can be managed with deep breaths. A pulmonary function test in March showed moderate restrictive lung disease, and a chest x-ray in January 2025 revealed low lung volumes, a small left pleural effusion, and mild pulmonary interstitial edema. A CTA in 2022 showed clear lungs with mild atelectasis.  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 Her echocardiogram in January 2025 indicated mild left ventricular hypertrophy and grade one diastolic dysfunction, with normal heart function at 65-70%. She is under the care of a cardiologist.  No history of COPD, asthma, or smoking. Her BMI is 38, and she acknowledges that weight loss could potentially improve her symptoms. She uses a portable oxygen  concentrator and is considering obtaining a home concentrator for better management of her oxygen  needs.      No Known Allergies  Immunization History  Administered Date(s) Administered   Moderna Sars-Covid-2 Vaccination 04/02/2020, 04/30/2020    Past Medical History:  Diagnosis Date   Acute respiratory distress syndrome (ARDS) due to COVID-19 virus (  HCC) 09/2019   Chronic combined systolic and diastolic CHF (congestive heart failure) (HCC)    Diabetes mellitus, type 2 (HCC)    Dyspnea    with exertion   GERD (gastroesophageal reflux disease)    Hyperlipidemia    Hypertension    Sleep apnea     Tobacco History: Social History   Tobacco Use  Smoking Status Never  Smokeless Tobacco Never   Counseling given: Not  Answered   Outpatient Medications Prior to Visit  Medication Sig Dispense Refill   acetaminophen  (TYLENOL ) 325 MG tablet Take 2 tablets (650 mg total) by mouth every 6 (six) hours as needed for mild pain (or Fever >/= 101). 12 tablet 0   amLODipine  (NORVASC ) 10 MG tablet Take 1 tablet (10 mg total) by mouth daily. 30 tablet 3   carvedilol  (COREG ) 6.25 MG tablet TAKE 1 TABLET TWICE A DAY 180 tablet 2   denosumab  (PROLIA ) 60 MG/ML SOSY injection INJECT 60 MG INTO THE SKIN EVERY 6 MONTHS.     docusate sodium  (COLACE) 100 MG capsule Take 1 capsule (100 mg total) by mouth 2 (two) times daily. 10 capsule 0   empagliflozin  (JARDIANCE ) 10 MG TABS tablet Take 1 tablet (10 mg total) by mouth daily before breakfast. 90 tablet 3   furosemide  (LASIX ) 40 MG tablet Take 1 tablet (40 mg total) by mouth daily. 30 tablet 2   HYDROcodone -acetaminophen  (NORCO/VICODIN) 5-325 MG tablet Take 1-2 tablets by mouth every 6 (six) hours as needed for moderate pain or severe pain. 10 tablet 0   LINZESS  145 MCG CAPS capsule Take 145 mcg by mouth daily.     losartan  (COZAAR ) 100 MG tablet Take 1 tablet (100 mg total) by mouth daily. 30 tablet 5   methocarbamol  (ROBAXIN ) 500 MG tablet Take 1 tablet (500 mg total) by mouth every 6 (six) hours as needed for muscle spasms. 10 tablet 0   Multiple Vitamin (MULTIVITAMIN) tablet Take 1 tablet by mouth daily.     omeprazole (PRILOSEC) 40 MG capsule Take 40 mg by mouth daily.     oxyCODONE  (ROXICODONE ) 5 MG immediate release tablet Take 1 tablet (5 mg total) by mouth every 6 (six) hours as needed for severe pain (pain score 7-10). 10 tablet 0   rosuvastatin  (CRESTOR ) 10 MG tablet Take 10 mg by mouth daily.     VASCEPA  1 g capsule Take 2 g by mouth 2 (two) times daily.     No facility-administered medications prior to visit.   Review of Systems  Review of Systems  Constitutional:  Positive for fatigue.  HENT: Negative.    Respiratory:  Positive for shortness of breath. Negative  for cough.   Cardiovascular: Negative.      Physical Exam  There were no vitals taken for this visit. Physical Exam Constitutional:      Appearance: Normal appearance.  HENT:     Head: Normocephalic and atraumatic.  Cardiovascular:     Rate and Rhythm: Normal rate and regular rhythm.  Pulmonary:     Effort: Pulmonary effort is normal.     Breath sounds: Normal breath sounds. No wheezing, rhonchi or rales.     Comments: O2 91% RA Skin:    General: Skin is warm and dry.  Neurological:     General: No focal deficit present.     Mental Status: She is alert and oriented to person, place, and time. Mental status is at baseline.  Psychiatric:        Mood and  Affect: Mood normal.        Behavior: Behavior normal.        Thought Content: Thought content normal.        Judgment: Judgment normal.      Lab Results:  CBC    Component Value Date/Time   WBC 9.5 10/24/2023 0431   RBC 4.67 10/24/2023 0431   HGB 13.2 10/24/2023 0431   HCT 45.3 10/24/2023 0431   PLT 201 10/24/2023 0431   MCV 97.0 10/24/2023 0431   MCH 28.3 10/24/2023 0431   MCHC 29.1 (L) 10/24/2023 0431   RDW 13.8 10/24/2023 0431   LYMPHSABS 1.5 03/14/2023 1340   MONOABS 0.6 03/14/2023 1340   EOSABS 0.1 03/14/2023 1340   BASOSABS 0.1 03/14/2023 1340    BMET    Component Value Date/Time   NA 139 10/25/2023 0413   K 3.6 10/25/2023 0413   CL 96 (L) 10/25/2023 0413   CO2 34 (H) 10/25/2023 0413   GLUCOSE 118 (H) 10/25/2023 0413   BUN 17 10/25/2023 0413   CREATININE 0.43 (L) 10/25/2023 0413   CALCIUM  8.3 (L) 10/25/2023 0413   GFRNONAA >60 10/25/2023 0413   GFRAA >60 10/11/2019 0440    BNP    Component Value Date/Time   BNP 70.0 10/23/2023 1219    ProBNP No results found for: "PROBNP"  Imaging: MM 3D SCREENING MAMMOGRAM BILATERAL BREAST Result Date: 01/16/2024 CLINICAL DATA:  Screening. EXAM: DIGITAL SCREENING BILATERAL MAMMOGRAM WITH TOMOSYNTHESIS AND CAD TECHNIQUE: Bilateral screening digital  craniocaudal and mediolateral oblique mammograms were obtained. Bilateral screening digital breast tomosynthesis was performed. The images were evaluated with computer-aided detection. COMPARISON:  Previous exam(s). ACR Breast Density Category b: There are scattered areas of fibroglandular density. FINDINGS: There are no findings suspicious for malignancy. IMPRESSION: No mammographic evidence of malignancy. A result letter of this screening mammogram will be mailed directly to the patient. RECOMMENDATION: Screening mammogram in one year. (Code:SM-B-01Y) BI-RADS CATEGORY  1: Negative. Electronically Signed   By: Alger Infield M.D.   On: 01/16/2024 11:10     Assessment & Plan:   1. Chronic respiratory failure with hypoxia (HCC) (Primary) - CT Chest High Resolution; Future  2. Shortness of breath - CT Chest High Resolution; Future  Assessment and Plan    Obstructive Sleep Apnea Moderate to severe obstructive sleep apnea diagnosed in October 2022 with an AHI of 20 and average oxygen  saturation of 75%. She experiences CPAP intolerance due to discomfort, sweating, and nasal symptoms. Patient is interested in [place] as an alternative treatment.  - Schedule a sleep endoscopy with Dr. Alva  - Provide information on [place], including a video and pamphlet. - Refer to an ENT specialist for preoperative evaluation if qualified for Inspire.  Obesity Hypoventilation Syndrome Obesity hypoventilation syndrome contributing to low oxygen  levels. BMI is 38, indicating obesity. Weight loss could improve symptoms. Oxygen  levels drop during exertion, and a home oxygen  concentrator is recommended for use during exertion and as needed. - SpO2 86% on RA, requiring 2L to maintain O2 >88-90%/ Patient needs to use 2L oxygen  with exertion. Order a home oxygen  concentrator with DME. Patient already has POC.  - Recommend weight loss to improve symptoms.    01/22/2024    1604  Resting   Supplemental oxygen  during  test? Yes  O2 Flow Rate (L/min) 2 L/min  Type Continuous  FiO2 (%) --  Resting Heart Rate 88  Resting Sp02 94  Lap 1 (250 feet)   HR 110  02 Sat 86  Lap 2 (250 feet)   HR 114  02 Sat 92  Lap 3 (250 feet)      Moderate Restrictive Lung Disease Moderate restrictive lung disease identified on a breathing test in March. Possible contribution from obesity. Previous chest x-ray showed low lung volumes and mild pulmonary interstitial edema.  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 No surgical intervention planned at this time.  Mild Diastolic Dysfunction Mild diastolic dysfunction with impaired relaxation of the left ventricle. Echocardiogram in January 2025 showed normal heart function with mild left ventricular hypertrophy. - Continue management with cardiologist, Dr. Amanda Jungling.  40 mins spent on visit; > 50% face to face with patient   Antonio Baumgarten, NP 01/22/2024

## 2024-01-23 NOTE — Telephone Encounter (Signed)
 Ok, thank you for your input. Kristina [name] please let patient after her case was reviewed by Dr. Villa Greaser he did not feel she would be a good candidate for Inspire device, recommend CPAP titration study- evaluate for BIPAP need. Can you please order

## 2024-01-24 ENCOUNTER — Ambulatory Visit (HOSPITAL_COMMUNITY): Attending: Surgery

## 2024-01-24 ENCOUNTER — Encounter (HOSPITAL_COMMUNITY): Payer: Self-pay

## 2024-01-24 ENCOUNTER — Telehealth: Payer: Self-pay | Admitting: Primary Care

## 2024-01-24 DIAGNOSIS — R29898 Other symptoms and signs involving the musculoskeletal system: Secondary | ICD-10-CM | POA: Diagnosis not present

## 2024-01-24 DIAGNOSIS — M5459 Other low back pain: Secondary | ICD-10-CM | POA: Diagnosis not present

## 2024-01-24 NOTE — Telephone Encounter (Signed)
 Per Devra Fontana at [employer]  We will need sats in the process note  Room air at "rest": Room air while "ambulating": Recovering sat on XX liter flow:

## 2024-01-24 NOTE — Therapy (Signed)
 OUTPATIENT PHYSICAL THERAPY THORACOLUMBAR TREATMENT   Patient Name: Kristina [name] MRN: [id number] DOB:1951-09-25, 73 y.o., female Today's Date: 01/24/2024  END OF SESSION:  PT End of Session - 01/24/24 1018     Visit Number 4    Number of Visits 12    Date for PT Re-Evaluation 02/04/24    Authorization Type [employer]    Authorization Time Period No auth required    Progress Note Due on Visit 10    PT Start Time 1018    PT Stop Time 1056    PT Time Calculation (min) 38 min    Activity Tolerance Patient tolerated treatment well    Behavior During Therapy WFL for tasks assessed/performed              Past Medical History:  Diagnosis Date   Acute respiratory distress syndrome (ARDS) due to COVID-19 virus (HCC) 09/2019   Chronic combined systolic and diastolic CHF (congestive heart failure) (HCC)    Diabetes mellitus, type 2 (HCC)    Dyspnea    with exertion   GERD (gastroesophageal reflux disease)    Hyperlipidemia    Hypertension    Sleep apnea    Past Surgical History:  Procedure Laterality Date   COLONOSCOPY N/A 12/21/2016   Procedure: COLONOSCOPY;  Surgeon: Ruby Corporal, MD;  Location: [facility];  Service: Endoscopy;  Laterality: N/A;  930   KYPHOPLASTY N/A 11/13/2023   Procedure: - Thoracic eleven - Lumbar one kyphoplasty;  Surgeon: Van Gelinas, MD;  Location[facility] OR;  Service: Neurosurgery;  Laterality: N/A;   ORIF ACETABULAR FRACTURE Left 03/16/2023   Procedure: OPEN REDUCTION INTERNAL FIXATION (ORIF) ACETABULAR FRACTURE STOPPA APPROACH;  Surgeon: Laneta Pintos, MD;  Location: [facility] OR;  Service: Orthopedics;  Laterality: Left;   Patient Active Problem List   Diagnosis Date Noted   Class 2 severe obesity due to excess calories with serious comorbidity and body mass index (BMI) of 38.0 to 38.9 in adult [facility]) 10/25/2023   Acute exacerbation of congestive heart failure (HCC) 10/23/2023   Acute on chronic respiratory failure with hypoxia (HCC)  10/23/2023   Unsteady gait when walking 09/05/2023   Low back pain 09/05/2023   Hypernatremia 09/05/2023   Chronic constipation 05/10/2023   Hemorrhoids 05/10/2023   Closed fracture of right acetabulum (HCC) 05/02/2023   Acetabular fracture (HCC) 03/14/2023   Candidiasis of skin 02/12/2023   Osteoporosis 01/22/2023   Proteinuria 06/15/2022   Obstructive sleep apnea 09/08/2021   Congestive heart failure (HCC) 07/29/2021   Hyperglycemia due to type 2 diabetes mellitus (HCC) 05/04/2021   Chronic systolic heart failure (HCC) 04/09/2021   Hypoxia    Acute hypoxemic respiratory failure (HCC) 04/05/2021   SOB (shortness of breath) 04/05/2021   Tremor 04/05/2021   Impaired fasting glucose 01/25/2021   Gastroesophageal reflux disease without esophagitis 01/25/2021   Mixed hyperlipidemia 01/25/2021   Morbid obesity (HCC) 01/25/2021   Prediabetes 01/25/2021   Tachycardia 01/25/2021   Pain in left knee 05/20/2020   Essential hypertension 10/08/2019   Uncontrolled diabetes mellitus 10/08/2019   UTI (urinary tract infection) 10/08/2019   Thrush 10/08/2019   Pneumonia due to COVID-19 virus 10/08/2019   Chronic respiratory failure with hypoxia (HCC) 10/02/2019   Acute respiratory distress syndrome (ARDS) due to COVID-19 virus (HCC) 10/02/2019   Pain in joint of right shoulder 07/15/2018   Pain in right foot 07/15/2018   Special screening for malignant neoplasms, colon 11/06/2016    PCP: Omie Bickers, MD  REFERRING PROVIDER: Tomlinson, Sara Caylin, PA-C  REFERRING DIAG:  G95.621H (ICD-10-CM) - Wedge compression fracture of first lumbar vertebra, initial encounter for closed fracture    Rationale for Evaluation and Treatment: Rehabilitation  THERAPY DIAG:  Other low back pain  Weakness of lower extremity, unspecified laterality  ONSET DATE: June 2024  SUBJECTIVE:                                                                                                                                                                                            SUBJECTIVE STATEMENT: Reports she has some stiffness in the morning, has began exercises in the morning that assists some.  No reports of pain.  Evaluation: Patient reports she fell and broke Left hip last year after stepping on a curb but a few months ago her back started hurting. They found compression fractures. Filled them with cement. And that pain has gotten better, She's been sleeping in recliner because its hard to get into higher bed. Her legs just feel weak. Sleeps on her side and has adjustable bed. STS is painful. Uses [employer] for long distance. (I) with iADLs and ADLs. Still driving  PERTINENT HISTORY:  ORIF Left hip summer 2024  PAIN:  Are you having pain? No  PRECAUTIONS: None  RED FLAGS: Bowel or bladder incontinence: Yes: Seeing doctor about it next week    WEIGHT BEARING RESTRICTIONS: No  FALLS:  Has patient fallen in last 6 months? No  LIVING ENVIRONMENT: Stairs: Yes: External: 3 steps; on right going up, on left going up, and can reach both Has following equipment at home: Single point cane, Walker - 2 wheeled, and bed side commode  OCCUPATION: Retired  PLOF: Independent  PATIENT GOALS: "Just hoping to strengthen legs"  NEXT MD VISIT: Next week   OBJECTIVE:  Note: Objective measures were completed at Evaluation unless otherwise noted.  DIAGNOSTIC FINDINGS:  COMPARISON:  08/06/2022   FINDINGS: Segmentation:  5 lumbar type vertebrae   Alignment:  Mild scoliosis   Vertebrae: Essentially resolved marrow edema at the level of L2 compression fracture seen on prior. Height loss did progress eccentric towards the left, over 50% at this level where there is also mild retropulsion. There is now marrow edema and compression deformity at the T11 and L1 vertebral bodies with a horizontal hypointense fracture plane best seen at L1. No evidence of aggressive bone lesion.   Conus medullaris and  cauda equina: Conus extends to the T12-L1 level. Conus and cauda equina appear normal.   Paraspinal and other soft tissues: Renal hilar and renal cortical cysts. No follow-up imaging recommended   Disc levels:  T12- L1: Disc space narrowing and circumferential bulging. Negative facets.   L1-L2: Disc space narrowing and bulging. Degenerative facet spurring asymmetric to the left. Mild left foraminal narrowing   L2-L3: Disc narrowing and bulging.  Negative facets.   L3-L4: Disc narrowing and bulging.  Mild facet spurring.   L4-L5: Disc collapse with endplate ridging and facet spurring centric to the right. Right foraminal protrusion and foraminal impingement to a moderate degree on sagittal images.   L5-S1:Moderate degenerative facet spurring with mild anterolisthesis. No herniation or impingement.   IMPRESSION: 1. Compression fractures at T11 and L1 which are acute or subacute. Height loss is mild at both levels and there is no retropulsion. 2. Interval healing of L2 compression fracture seen on 2023 comparison. Height loss progressed in the left vertebral body since prior. 3. Degenerative foraminal impingement on the right at L4-5  PATIENT SURVEYS:  Lower Extremity Functional Score: 29 / 80 = 36.3 %  COGNITION: Overall cognitive status: Within functional limits for tasks assessed     SENSATION: WFL  MUSCLE LENGTH: Hamstrings: Right WFL deg; Left WFL deg Modified Thomas test: Tightness noted bilat, L>R  POSTURE:  Kyphotic  PALPATION:   LUMBAR ROM:   AROM eval  Flexion Mid shin, painful  Extension 50%  Right lateral flexion   Left lateral flexion   Right rotation   Left rotation    (Blank rows = not tested)  LOWER EXTREMITY ROM:     Passive  Right eval Left eval  Hip flexion    Hip extension    Hip abduction    Hip adduction    Hip internal rotation ~30 ~20  Hip external rotation ~40 ~40  Knee flexion    Knee extension    Ankle dorsiflexion     Ankle plantarflexion    Ankle inversion    Ankle eversion     (Blank rows = not tested)  LOWER EXTREMITY MMT:    MMT Right eval Left eval  Hip flexion 4- 3+, painful  Hip extension    Hip abduction    Hip adduction    Hip internal rotation    Hip external rotation    Knee flexion 4 4-  Knee extension 4 4-  Ankle dorsiflexion 5 5  Ankle plantarflexion    Ankle inversion    Ankle eversion     (Blank rows = not tested)  LUMBAR SPECIAL TESTS:    FUNCTIONAL TESTS:  5 times sit to stand: 12", SOB afterwards 2 minute walk test: 325', reports LE feel weak,   GAIT: Distance walked: 325 Assistive device utilized: None Level of assistance: Complete Independence Comments: Dec stance on R causing limp, dec arm swing with RUE, dec hip ext bilat, dec active DF bilat (R>L) causing mild hip hike  TREATMENT DATE:  01/24/24: Seated: - STS with GTB around thigh, eccentric control no HHA 10x - Marching with GTB  around thigh, core set no UE support 10x 5" - Rows 10x3" with RTB - Wback 10x 3" Standing with light UE support on // bars - Marching  10x 3" - Heel raise 10x 3" - Toe raises 10x 3" - Abduction 10x 3"  - Shoulder extension RTB 2 sets 10 reps - Squats front of chair, cueing for mechanics. - Tandem stance 3x 30"  01/18/24: [place] trail x with UE/LE seat 4  Sidelying: Clam with YTB 2x 10 3" holds  Supine: (Pt able to recall bridge, SLR, LTR, SKTC as HEP compliance) Butterfly stretch  3x30" Bridge with YTB around thighs 10x 3" Marching with ab set 10x 3"  STS no UE 10x   Standing: Lumbar extension 5x Rotation 5x each   01/15/24 Seated:  HEP review/goals  Sit to stands no UE 10X Supine:  TA iso 10X5"  Bridge 10X  SLR 10X each  LTR 10X each  Knee to chest with towel 4X20" each   01/07/24:  PT Evaluation and HEP admin                                                                                                                              PATIENT EDUCATION:  Education details: PT evaluation, objective findings, POC, Importance of HEP, Precautions, Clinic policies Person educated: Patient Education method: Explanation and Demonstration Education comprehension: verbalized understanding and returned demonstration  HOME EXERCISE PROGRAM: Access Code: [email or web address] URL: [email or web address] Date: 01/07/2024 Prepared by: Virgia Griffins Powell-Butler  Exercises - Supine Bilateral Hip Internal Rotation Stretch  - 2 x daily - 7 x weekly - 2 sets - 10 reps - Supine Transversus Abdominis Bracing - Hands on Stomach  - 2 x daily - 7 x weekly - 2 sets - 10 reps - 5 hold  Date: 01/15/2024 - Sit to Stand  - 2 x daily - 7 x weekly - 2 sets - 10 reps - Beginner Bridge  - 2 x daily - 7 x weekly - 2 sets - 10 reps - Supine Pelvic Tilt with Straight Leg Raise  - 2 x daily - 7 x weekly - 2 sets - 10 reps - Supine Lower Trunk Rotation  - 2 x daily - 7 x weekly - 2 sets - 10 reps - 5 sec hold - Supine Single Knee to Chest Stretch  - 2 x daily - 7 x weekly - 2 sets - 3 reps - 20-30 sec hold  01/18/24 - Clam with Resistance  - 2 x daily - 7 x weekly - 1 sets - 10 reps - 3-5" hold - Supine Butterfly Groin Stretch  - 2 x daily - 7 x weekly - 1 sets - 3 reps - 30" hold  01/24/24: - Standing March with Unilateral Counter Support  - 2 x daily - 7 x weekly - 2 sets - 10 reps - 5" hold - Heel Toe Raises with Counter Support  - 2 x daily - 7 x weekly - 2 sets - 10 reps - 5" hold - Standing Hip Abduction with Unilateral Counter Support  - 2 x daily - 7 x weekly - 2 sets - 10 reps - 5" hold   ASSESSMENT:  CLINICAL IMPRESSION: Progressed to standing closed chain strengthening exercises with focus on proximal strengthening and posture.  Pt able to complete all exercises with good form following initial cueing and no reports of increased pain through session.  Added some standing exercises to HEP with printout given and verbalized  understanding.  Evaluation:  Patient is a 73 y.o. female  who was seen today for physical therapy evaluation and treatment for  S32.010A (ICD-10-CM) - Wedge compression fracture of first lumbar vertebra, initial encounter for closed fracture  Patient demonstrates decreased LE strength and passive ROM, decreased active lumbar ROM, and decreased endurance which may be contributing to her altered gait mechanics, decreased activity tolerance, and overall pain. Patient reporting her main concern as LE weakness as her back pain is not as consistent. Patient educated on continuing to avoid forward flexion as precaution of her compression fractures. HEP given today to begin core strengthening and hip ROM. Patient will benefit from skilled physical therapy in order to address the above to improve her function and QOL.    OBJECTIVE IMPAIRMENTS: Abnormal gait, decreased activity tolerance, decreased endurance, decreased mobility, difficulty walking, decreased ROM, decreased strength, and pain.   ACTIVITY LIMITATIONS: bending, standing, squatting, sleeping, transfers, and bed mobility  PARTICIPATION LIMITATIONS: cleaning, community activity, and yard work  [facility] POTENTIAL: Good  CLINICAL DECISION MAKING: Stable/uncomplicated  EVALUATION COMPLEXITY: Low   GOALS: Goals reviewed with patient? Yes  SHORT TERM GOALS: Target date: 01/21/24  Patient will be independent with performance of HEP to demonstrate adequate self management of symptoms.  Baseline:  Goal status: INITIAL  2.   Patient will report at least a 25% improvement with function or pain overall since beginning PT. Baseline:  Goal status: INITIAL  LONG TERM GOALS: Target date: 02/18/24  Patient will improve LEFS score by at least  20  points in order to improve self-perceived disability and overall function.  Baseline: Goal status: INITIAL  2.  Patient will improve  2 MW test by at least 75ft  in order to display improved LE strength  and endurance to improve gait. Baseline:  Goal status: INITIAL  3.  Patient will gain at least  10 deg of PROM in  L hip IR  in order to improve hip mobility in order to decrease pain.  Baseline:  Goal status: INITIAL   5.  Patient will report overall 50% improvement since beginning PT. Baseline:  Goal status: INITIAL  PLAN:  PT FREQUENCY: 2x/week  PT DURATION: 6 [place]  PLANNED INTERVENTIONS: 97164- PT Re-evaluation, 97110-Therapeutic exercises, 97530- Therapeutic activity, 97112- Neuromuscular re-education, 97535- Self Care, 40981- Manual therapy, [phone]- Gait training, [phone]- Traction (mechanical), Patient/Family education, Balance training, Stair training, Joint mobilization, and Spinal mobilization.  PLAN FOR NEXT SESSION: Progress core strengthening, Progress hip ROM activity, intro LE strengthening, postural exercises.    Minor Amble, LPTA/CLT; CBIS [phone]  2:00 PM, 01/24/24

## 2024-01-25 ENCOUNTER — Other Ambulatory Visit: Payer: Self-pay | Admitting: *Deleted

## 2024-01-25 ENCOUNTER — Encounter: Payer: Self-pay | Admitting: *Deleted

## 2024-01-25 ENCOUNTER — Other Ambulatory Visit: Payer: Self-pay

## 2024-01-25 NOTE — Telephone Encounter (Signed)
They are in there

## 2024-01-25 NOTE — Patient Instructions (Signed)
 Visit Information  Thank you for taking time to visit with me today. Please don't hesitate to contact me if I can be of assistance to you before our next scheduled appointment.  Your next care management appointment is by telephone on 02/29/24 at 1:30 pm  Continue the good work. You are progressing well!  Please call the care guide team at [phone] if you need to cancel, schedule, or reschedule an appointment.   Please call the Suicide and Crisis Lifeline: 988 call the [place]  National Suicide Prevention Lifeline: (831) 294-6174 or TTY: (726)847-7859 TTY 802-680-8576) to talk to a trained counselor call 1-800-273-TALK (toll free, 24 hour hotline) call the [facility]: (313)[phone] call 911 if you are experiencing a Mental Health or Behavioral Health Crisis or need someone to talk to.   Alohilani Levenhagen L. Mcarthur Speedy, RN, BSN, CCM [place]  [facility], [facility] RN Care Manager Direct Dial: [phone]  Fax: [phone]

## 2024-01-25 NOTE — Telephone Encounter (Signed)
 Update has been sent to DME. NFN

## 2024-01-28 ENCOUNTER — Encounter (HOSPITAL_COMMUNITY): Admitting: Physical Therapy

## 2024-01-28 NOTE — Telephone Encounter (Signed)
 Per Patirica at [employer]-  I am not finding the sats in the ordering providers recent note. Am i over looking this?  Please advise. Thank you!

## 2024-01-30 ENCOUNTER — Encounter (HOSPITAL_COMMUNITY)

## 2024-01-31 ENCOUNTER — Encounter (INDEPENDENT_AMBULATORY_CARE_PROVIDER_SITE_OTHER): Payer: Self-pay | Admitting: Otolaryngology

## 2024-01-31 NOTE — Telephone Encounter (Signed)
 I called and spoke to pt. Pt informed of Beth's note and verbalized understanding. Titration study ordered. Nfn

## 2024-02-01 ENCOUNTER — Ambulatory Visit
Admission: RE | Admit: 2024-02-01 | Discharge: 2024-02-01 | Disposition: A | Discharge status: Home / Self Care | Source: Ambulatory Visit | Attending: Primary Care | Admitting: Primary Care

## 2024-02-01 DIAGNOSIS — R0602 Shortness of breath: Secondary | ICD-10-CM | POA: Diagnosis not present

## 2024-02-01 DIAGNOSIS — U099 Post covid-19 condition, unspecified: Secondary | ICD-10-CM | POA: Diagnosis not present

## 2024-02-01 DIAGNOSIS — J9611 Chronic respiratory failure with hypoxia: Secondary | ICD-10-CM

## 2024-02-01 NOTE — Telephone Encounter (Signed)
 Thanks! I sent it over to [facility]

## 2024-02-01 NOTE — Telephone Encounter (Signed)
 Ambulatory Pulse Oximetry  Row Name 01/22/24 1604      Resting  Supplemental oxygen  during test? Yes      O2 Flow Rate (L/min) 2 L/min      Type Continuous      Resting Heart Rate 88      Resting Sp02 94      Lap 1 (250 feet)  HR 110      02 Sat 86      Lap 2 (250 feet)  HR 114      02 Sat 92      Lap 3 (250 feet)  Tech Comments: Pt walked at a normal to slow walking pace. As soon as we began lap 1, her O2 dropped and was put on 2L O2 cont. Pt can maintain on 2L        This is in the pts chart.

## 2024-02-04 ENCOUNTER — Encounter (HOSPITAL_COMMUNITY)

## 2024-02-04 ENCOUNTER — Telehealth: Payer: Self-pay | Admitting: Primary Care

## 2024-02-04 NOTE — Telephone Encounter (Signed)
 Kristina [name]; Shirline Dover; Osie Bleacher; Tucker, Dolanda; Cain, Mitchell; 1 other This patient wants to hold off. She states her provider told her it would be covered but on our end she is showing a 20% co-insurance.  Just an FYI

## 2024-02-06 ENCOUNTER — Encounter (HOSPITAL_COMMUNITY)

## 2024-02-06 ENCOUNTER — Ambulatory Visit: Payer: Self-pay | Admitting: Primary Care

## 2024-02-06 NOTE — Progress Notes (Signed)
 Please let patient know CT chest showed no evidence of fibrotic lung disease, evidence of air trapping which can be seeing with asthma or COPD. Enlarged pulmonary artery. Aortic atherosclerosis. Continue to wear CPAP nightly and oxygen . Continue Crestor  and weight loss.

## 2024-02-08 NOTE — Patient Outreach (Signed)
 Complex Care Management   Visit Note  02/08/2024 Late entry for 01/25/24  Name:  Kristina [name] MRN: [id number] DOB: [birth year]  Situation: Referral received for Complex Care Management related to Heart Failure I obtained verbal consent from Patient.  Visit completed with Shereen Dike  on the phone  Background:   Past Medical History:  Diagnosis Date   Acute respiratory distress syndrome (ARDS) due to COVID-19 virus (HCC) 09/2019   Chronic combined systolic and diastolic CHF (congestive heart failure) (HCC)    Diabetes mellitus, type 2 (HCC)    Dyspnea    with exertion   GERD (gastroesophageal reflux disease)    Hyperlipidemia    Hypertension    Sleep apnea     Assessment: Patient Reported Symptoms:  Cognitive Cognitive Status: Alert and oriented to person, place, and time, Insightful and able to interpret abstract concepts, Normal speech and language skills Cognitive/Intellectual Conditions Management [RPT]: None reported or documented in medical history or problem list   Healing Pattern: Average Health Facilitated by: Pain control, Rest  Neurological Neurological Review of Symptoms: No symptoms reported Neurological Management Strategies: Adequate rest, Routine screening Neurological Self-Management Outcome: 4 (good)  HEENT HEENT Symptoms Reported: No symptoms reported HEENT Management Strategies: Routine screening HEENT Self-Management Outcome: 4 (good)    Cardiovascular Cardiovascular Symptoms Reported: Swelling in legs or feet Does patient have uncontrolled Hypertension?: Yes Is patient checking Blood Pressure at home?: Yes Cardiovascular Conditions: Dysrhythmia, Heart failure, High blood cholesterol, Hypertension Cardiovascular Management Strategies: Activity, Adequate rest, Diet modification, Medical device, Medication therapy, Routine screening, Weight management Do You Have a Working Readable Scale?: Yes Cardiovascular Self-Management Outcome: 3 (uncertain)   Respiratory Respiratory Symptoms Reported: Shortness of breath, Dry cough Other Respiratory Symptoms: hx covid Respiratory Conditions: Sleep disordered breathing, Cough Respiratory Self-Management Outcome: 3 (uncertain) Respiratory Comment: CPAP since 2002 interestedin an inspire machine  Endocrine Patient reports the following symptoms related to hypoglycemia or hyperglycemia : No symptoms reported Is patient diabetic?: Yes Is patient checking blood sugars at home?: Yes Endocrine Conditions: Diabetes Endocrine Management Strategies: Activity, Adequate rest, Diet modification, Medication therapy, Routine screening, Weight management, Medical device Endocrine Self-Management Outcome: 4 (good)  Gastrointestinal Gastrointestinal Symptoms Reported: Obesity Gastrointestinal Conditions: Constipation, Reflux/heartburn, Other Other Gastrointestinal Conditions: thrush Gastrointestinal Management Strategies: Activity, Adequate rest, Medication therapy, Diet modification, Fluid modification Gastrointestinal Self-Management Outcome: 4 (good) Nutrition Risk Screen (CP): No indicators present  Genitourinary Genitourinary Symptoms Reported: Frequency Genitourinary Conditions: Frequency, Urinary tract infection Genitourinary Management Strategies: Adequate rest, Fluid modification, Medication therapy, Incontinence garment/pad Genitourinary Self-Management Outcome: 3 (uncertain)  Integumentary Integumentary Symptoms Reported: No symptoms reported Skin Management Strategies: Routine screening Skin Self-Management Outcome: 4 (good)  Musculoskeletal Musculoskelatal Symptoms Reviewed: Weakness Additional Musculoskeletal Details: legs weak if sit too long, Had therapy, now cancelled   Falls in the past year?: Yes Number of falls in past year: 1 or less Was there an injury with Fall?: Yes Fall Risk Category Calculator: 2 Patient Fall Risk Level: Moderate Fall Risk Patient at Risk for Falls Due to: History  of fall(s) (last fall in June 2024) Fall risk Follow up: Falls evaluation completed  Psychosocial       Quality of Family Relationships: helpful, involved, supportive Do you feel physically threatened by others?: No      01/25/2024    2:21 PM  Depression screen PHQ 2/9  Decreased Interest 0  Down, Depressed, Hopeless 0  PHQ - 2 Score 0    There were no vitals filed for this visit.  Medications Reviewed Today     Reviewed by Arlyce Berger, RN (Registered Nurse) on 01/25/24 at 1420  Med List Status: <None>   Medication Order Taking? Sig Documenting Provider Last Dose Status Informant  acetaminophen  (TYLENOL ) 325 MG tablet [id number] Yes Take 2 tablets (650 mg total) by mouth every 6 (six) hours as needed for mild pain (or Fever >/= 101). Colin Dawley, MD Taking Active Self  amLODipine  (NORVASC ) 10 MG tablet [id number]  Take 1 tablet (10 mg total) by mouth daily. Colin Dawley, MD  Active Self  carvedilol  (COREG ) 6.25 MG tablet [id number]  TAKE 1 TABLET TWICE A DAY Branch, Joyceann No, MD  Active Self  denosumab  (PROLIA ) 60 MG/ML SOSY injection [id number]  INJECT 60 MG INTO THE SKIN EVERY 6 MONTHS. [provider]  Active Self  docusate sodium  (COLACE) 100 MG capsule [id number] Yes Take 1 capsule (100 mg total) by mouth 2 (two) times daily. Doreene Gammon D, DO Taking Active Self           Med Note Mcarthur Speedy, Lynford Sarin   Fri Jan 25, 2024  2:19 PM) Attempt to prevent worsening loose stools only taking Linzess  2-3 times a week   Kiora Hallberg L. Mcarthur Speedy, RN, BSN, CCM [place]  [facility], [facility] RN Care Manager Direct Dial: [phone]  Fax: [phone]   empagliflozin  (JARDIANCE ) 10 MG TABS tablet [id number] Yes Take 1 tablet (10 mg total) by mouth daily before breakfast. Laurann Pollock, MD Taking Active Self  fluconazole  (DIFLUCAN ) 150 MG tablet [id number] No TAKE 1 TABLET BY MOUTH NOW THEN 1 IN 3 DAYS  Patient not taking: Reported on 01/25/2024    [provider] Not Taking Active            Med Note Mcarthur Speedy, Cristhian Vanhook L   Fri Jan 25, 2024  2:20 PM) Completed dose Sherlonda Flater L. Mcarthur Speedy, RN, BSN, CCM  furosemide  (LASIX ) 40 MG tablet [id number] Yes Take 1 tablet (40 mg total) by mouth daily. Colin Dawley, MD Taking Active Self  LINZESS  145 MCG CAPS capsule [id number] No Take 145 mcg by mouth daily.  Patient not taking: Reported on 01/25/2024   [provider] Not Taking Active Self           Med Note Mcarthur Speedy, Lynford Sarin   Fri Jan 25, 2024  1:58 PM) Taking Linzess  daily caused loose stool. MWF 2-3 times a week  losartan  (COZAAR ) 100 MG tablet [id number]  Take 1 tablet (100 mg total) by mouth daily. Colin Dawley, MD  Active Self  methocarbamol  (ROBAXIN ) 500 MG tablet [id number] Yes Take 1 tablet (500 mg total) by mouth every 6 (six) hours as needed for muscle spasms. Doreene Gammon D, DO Taking Active Self  Multiple Vitamin (MULTIVITAMIN) tablet [id number] Yes Take 1 tablet by mouth daily. [provider] Taking Active Self  omeprazole (PRILOSEC) 40 MG capsule [id number]  Take 40 mg by mouth daily. [provider]  Active Self  rosuvastatin  (CRESTOR ) 10 MG tablet [id number]  Take 10 mg by mouth daily. [provider]  Active Self           Med Note Maryjo Snipe   Wed Mar 14, 2023  8:04 PM)    [facility] injection [id number] Yes  [provider] Taking Active   VASCEPA  1 g capsule [id number]  Take 2 g by mouth 2 (two) times daily. [provider]  Active Self            Recommendation:  Specialist & PCP Follow-up Continued monitoring/management of Heart failure swelling symptoms, follow the CHF/Diabetes action plans Patient voices interest in being assisted with obtaining an Inspire device   Follow Up Plan:   Telephone follow up appointment date/time:  02/29/24 1:30 pm   Jullie Oiler L. Mcarthur Speedy, RN, BSN, CCM [place]  [facility], [facility] RN Care  Manager Direct Dial: [phone]  Fax: [phone]

## 2024-02-09 DIAGNOSIS — S32402D Unspecified fracture of left acetabulum, subsequent encounter for fracture with routine healing: Secondary | ICD-10-CM | POA: Diagnosis not present

## 2024-02-09 DIAGNOSIS — R2689 Other abnormalities of gait and mobility: Secondary | ICD-10-CM | POA: Diagnosis not present

## 2024-02-11 ENCOUNTER — Encounter (HOSPITAL_COMMUNITY)

## 2024-02-11 ENCOUNTER — Ambulatory Visit (HOSPITAL_BASED_OUTPATIENT_CLINIC_OR_DEPARTMENT_OTHER): Attending: Primary Care | Admitting: Internal Medicine

## 2024-02-11 VITALS — Ht <= 58 in | Wt 181.0 lb

## 2024-02-11 DIAGNOSIS — G4733 Obstructive sleep apnea (adult) (pediatric): Secondary | ICD-10-CM | POA: Insufficient documentation

## 2024-02-13 ENCOUNTER — Encounter (HOSPITAL_COMMUNITY)

## 2024-02-16 DIAGNOSIS — G4733 Obstructive sleep apnea (adult) (pediatric): Secondary | ICD-10-CM | POA: Diagnosis not present

## 2024-02-16 NOTE — Procedures (Signed)
 Kristina [name] [facility] [address] [place], [place] [address] Tel: [phone]   Fax: [phone]  Titration Interpretation  Patient Name:  Kristina, [name] Date:  02/11/2024 Referring Physician:  Antonio Baumgarten, NP  Indications for Polysomnography The patient is a 73 year old Female who is 4\' 10"  and weighs 181.0 lbs. Her BMI equals 38.0.  A full night titration treatment study was performed. Diagnostic HST 07/20/21-  AHI 27.6/hr, desaturation to 38%-75%.  Medication was taken at 9:04 pm.  Tylenol    Polysomnogram Data A full night polysomnogram recorded the standard physiologic parameters including EEG, EOG, EMG, EKG, nasal and oral airflow.  Respiratory parameters of chest and abdominal movements were recorded with Respiratory Inductance Plethysmography belts.  Oxygen  saturation was recorded by pulse oximetry.   Sleep Architecture The total recording time of the polysomnogram was 387.2 minutes.  The total sleep time was 260.0 minutes.  The patient spent 7.1% of total sleep time in Stage N1, 78.8% in Stage N2, 0.6% in Stages N3, and 13.5% in REM.  Sleep latency was 46.0 minutes.  REM latency was 238.5 minutes.  Sleep Efficiency was 67.2%.  Wake after Sleep Onset time was 81.0 minutes.  Titration Summary The patient was titrated at pressures ranging from 6* cm/H20 with supplemental oxygen  at - up to 17* cm/H20 with supplemental oxygen  at O2: 4.  The last pressure used in the study was 17* cm/H20 with supplemental oxygen  at O2: 4.  Respiratory Events The polysomnogram revealed a presence of - obstructive, - central, and - mixed apneas resulting in an Apnea index of - events per hour.  There were 63 hypopneas (>=3% desaturation and/or arousal) resulting in an Apnea\Hypopnea Index (AHI >=3% desaturation and/or arousal) of 14.5 events per hour.  There were 33 hypopneas (>=4% desaturation) resulting in an Apnea\Hypopnea Index (AHI >=4% desaturation) of  7.6 events per hour.  There were 42 Respiratory Effort Related Arousals resulting in a RERA index of 9.7 events per hour. The Respiratory Disturbance Index is 24.2 events per hour.  The snore index was - events per hour.  Mean oxygen  saturation was 86.4%.  The lowest oxygen  saturation during sleep was 69.0%.  Time spent <=88% oxygen  saturation was 273.0 minutes (73.0%).  Limb Activity There were 220 limb movements recorded.  Of this total, 213 were classified as PLMs.  Of the PLMs, 121 were associated with arousals.  The Limb Movement index was 50.8 per hour while the PLM index was 49.2 per hour.  Cardiac Summary The average pulse rate was 86.8 bpm.  The minimum pulse rate was 75.0 bpm while the maximum pulse rate was 105.0 bpm.  Cardiac rhythm was normal with PVCs.  Comment: CPAP titration to 17 cwp with residual AHI 0/hr. Significant oxygen  desaturation during titration led to supplemental O2 up to 4L. On CPAP 17 with O2 4L, minimum O2 saturation 91%, mean 91.5%.  Diagnosis: Obstructive sleep apnea, Nocturnal Hypoxemia  Recommendations: Suggest CPAP 17 or autopap 10-20. Supplemental O2 4L   This study was personally reviewed and electronically signed by: Dr. [employer] Accredited Board Certified in Sleep Medicine Date/Time: 02/16/24    1:23   Titration Report  Patient Name: Kristina [name] Study Date: 02/11/2024  Date of Birth: 03-22-1951 Study Type: CPAP Titration  Age: 73 year MRN #: [id number]  Sex: Female Interpreting Physician: [employer] [id number]  Height: 4\' 10"  Referring Physician: Antonio Baumgarten, NP  Weight: 181.0 lbs Recording Tech: Roosvelt Colla RRT RPSGT RST  BMI: 38.0 Scoring Tech: Roosvelt Colla RRT RPSGT RST  ESS: 4 Neck Size: 15  Mask Type [employer] Simplus Final Pressure: 17 CMH2O WITH 2 CMH2O OF EPR  Mask Size: Small Supplemental O2: 4 LPM   Study Overview  Lights Off: 10:17:31 PM  Count Index  Lights On: 04:44:41 AM Awakenings: 27 6.2  Time  in Bed: 387.2 min. Arousals: 216 49.8  Total Sleep Time: 260.0 min. AHI (>=3% Desat and/or Ar.): 63 14.5   Sleep Efficiency: 67.2% AHI (>=4% Desat): 33 7.6   Sleep Latency: 46.0 min. Limb Movements: 220 50.8  Wake After Sleep Onset: 81.0 min. Snore: - -  REM Latency from Sleep Onset: 238.5 min. Desaturations: 72 16.6     Minimum SpO2 TST: 69.0%    Sleep Architecture  % of Time in Bed Stages Time (mins) % Sleep Time  Wake 128.0   Stage N1 18.5 7.1%  Stage N2 205.0 78.8%  Stage N3 1.5 0.6%  REM 35.0 13.5%   Arousal Summary   NREM REM Sleep Index  Respiratory Arousals 58 1 59 13.6  PLM Arousals 121 - 121 27.9  Isolated Limb Movement Arousals 4 - 4 0.9  Snore Arousals - - - -  Spontaneous Arousals 32 1 33 7.6  Total 214 2 216 49.8   Limb Movement Summary   Count Index  Isolated Limb Movements 7 1.6  Periodic Limb Movements (PLMs) 213 49.2  Total Limb Movements 220 50.8    Respiratory Summary   By Sleep Stage By Body Position Total   NREM REM Supine Non-Supine   Time (min) 225.0 35.0 166.5 93.5 260.0         Obstructive Apnea - - - - -  Mixed Apnea - - - - -  Central Apnea - - - - -  Total Apneas - - - - -  Total Apnea Index - - - - -         Hypopneas (>=3% Desat and/or Ar.) 41 22 34 29 63  AHI (>=3% Desat and/or Ar.) 10.9 37.7 12.3 18.6 14.5         Hypopneas (>=4% Desat) 21 12 20 13  33  AHI (>=4% Desat) 5.6 20.6 7.2 8.3 7.6          RERAs 42 - 6 36 42  RERA Index 11.2 - 2.2 23.1 9.7         RDI 22.1 37.7 14.4 41.7 24.2     Respiratory Event Durations   Apnea Hypopnea   NREM REM NREM REM  Average (seconds) - - 14.4 17.6  Maximum (seconds) - - 21.3 35.2    Oxygen  Saturation Summary   Wake NREM REM TST TIB  Average SpO2 84.9% 87.7% 82.5% 87.0% 86.4%  Minimum SpO2 71.0% 75.0% 69.0% 69.0% 69.0%  Maximum SpO2 95.0% 95.0% 93.0% 95.0% 95.0%   Oxygen  Saturation Distribution  Range (%) Time in range (min) Time in range (%)  90.0 - 100.0 73.9 19.8%   80.0 - 90.0 259.5 69.4%  70.0 - 80.0 40.1 10.7%  60.0 - 70.0 0.4 0.1%  50.0 - 60.0 - -  0.0 - 50.0 - -  Time Spent <=88% SpO2  Range (%) Time in range (min) Time in range (%)  0.0 - 88.0 273.0 73.0%      Count Index  Desaturations 72 16.6    Cardiac Summary   Wake NREM REM Sleep Total  Average Pulse Rate (BPM) 89.8 85.9 82.9 85.5 86.8  Minimum Pulse Rate (  BPM) 78.0 75.0 76.0 75.0 75.0  Maximum Pulse Rate (BPM) 105.0 96.0 94.0 96.0 105.0   Pulse Rate Distribution:  Range (bpm) Time in range (min) Time in range (%)  0.0 - 40.0 - -  40.0 - 60.0 - -  60.0 - 80.0 31.6 8.4%  80.0 - 100.0 338.4 90.4%  100.0 - 120.0 1.0 0.3%  120.0 - 140.0 - -  140.0 - 200.0 - -   Titration Summary  PAP Device PAP Level O2 Level Time (min) Wake (min) NREM (min) REM (min) Sleep Eff% OA# CA# MA# Hyp# (>=3%) AHI (>=3%) Hyp# (>=4%) AHI (>=%4) RERA RDI SpO2 <=88% (min) Min SpO2 Mean SpO2 Ar. Index  CPAP 6 - 20.0 20.0 0.0 0.0 0.0%               CPAP 6 O2: 1 50.0 42.5 7.5 0.0 15.0% - - - 13 104.0 6  48.0 4  136.0  7.5 75.0 79.8 112.0  CPAP 6 O2: 2 35.0 26.0 9.0 0.0 25.7% - - - 11 73.3 5  33.3 5  106.7  8.8 80.0 84.0 93.3  CPAP 6 O2: 3 6.0 1.0 5.0 0.0 83.3% - - - 1 12.0 1  12.0 3  48.0  5.0 84.0 85.8 132.0  CPAP 8 O2: 3 44.0 10.5 33.5 0.0 76.1% - - - 5 9.0 1  1.8 23  50.1  32.0 83.0 86.4 129.0  CPAP 10 O2: 3 29.0 0.5 28.5 0.0 98.3% - - - 1 2.1 1  2.1 1  4.2  28.0 83.0 86.7 75.8  CPAP 10 O2: 4 56.5 25.0 31.5 0.0 55.8% - - - 3 5.7 3  5.7 2  9.5  30.2 85.0 87.1 72.4  CPAP 12 O2: 4 55.5 1.0 43.5 11.0 98.2% - - - 15 16.5 11  12.1 1  17.6  53.1 69.0 84.3 23.1  CPAP 14 O2: 4 31.0 0.0 7.0 24.0 100.0% - - - 11 21.3 4  7.7 1  23.2  20.9 73.0 86.7 3.9  CPAP 16 O2: 4 36.0 0.5 35.5 0.0 98.6% - - - 3 5.1 1  1.7 2  8.5  0.0 89.0 91.6 8.5  CPAP 17 O2: 4 25.0 1.0 24.0 0.0 96.0% - - - - - -  - -  -  0.0 91.0 91.5 7.5    Hypnograms                           Technologist Comments  Patient  was ordered as a CPAP titration. Patient is a 73 year old white female who was sent to the sleep center for OSA. Patient was placed on CPAP of 6 cmh2o with 2 cmh2o of EPR at 10:17 pm and was increased to a CPAP pressure of 17 cmh2o with 2 cmh2o of EPR. No audible snoring was noted on final pressure setting. Patient was increased for respiratory events with and without a 4% Desat or arousal, and audible snoring. Patient tolerated CPAP trial fairly well. Patient was placed on 1 LPM of oxygen  at 10:37 pm and was increased to 4 LPM of oxygen  for SAO2 levels 88% or below per lab protocol. Patient reported taking her medication at 9:04 pm. PLM's/PLMA's were noted. Questionable cardiac arrhythmias were noted see epochs for examples: 63, 187, 703, 750, etc. Two restroom visits were noted. Patient was fitted with a [employer] Simplus nasal/oral full-face mask size small with heated humidification.  [employer] Diplomate, Biomedical engineer of Sleep Medicine  ELECTRONICALLY SIGNED ON:  02/16/2024, 1:16 PM [facility] PH: [phone]   FX: [phone] ACCREDITED BY THE AMERICAN ACADEMY OF SLEEP MEDICINE

## 2024-02-20 ENCOUNTER — Encounter (HOSPITAL_COMMUNITY)

## 2024-02-21 ENCOUNTER — Telehealth: Payer: Self-pay

## 2024-02-21 DIAGNOSIS — R0902 Hypoxemia: Secondary | ICD-10-CM | POA: Diagnosis not present

## 2024-02-21 DIAGNOSIS — J9611 Chronic respiratory failure with hypoxia: Secondary | ICD-10-CM | POA: Diagnosis not present

## 2024-02-21 NOTE — Telephone Encounter (Signed)
 Copied from CRM [id number]. Topic: Clinical - Lab/Test Results >> Feb 19, 2024  8:21 AM Hilton Lucky wrote: Reason for CRM: Relayed pt results verbatim. No questions about results at this time.  NFN

## 2024-02-22 DIAGNOSIS — E1169 Type 2 diabetes mellitus with other specified complication: Secondary | ICD-10-CM | POA: Diagnosis not present

## 2024-02-22 DIAGNOSIS — I5022 Chronic systolic (congestive) heart failure: Secondary | ICD-10-CM | POA: Diagnosis not present

## 2024-02-22 DIAGNOSIS — G4733 Obstructive sleep apnea (adult) (pediatric): Secondary | ICD-10-CM | POA: Diagnosis not present

## 2024-02-22 DIAGNOSIS — E669 Obesity, unspecified: Secondary | ICD-10-CM | POA: Diagnosis not present

## 2024-02-25 ENCOUNTER — Encounter (HOSPITAL_COMMUNITY)

## 2024-02-29 ENCOUNTER — Telehealth: Payer: Self-pay | Admitting: *Deleted

## 2024-02-29 ENCOUNTER — Encounter: Payer: Self-pay | Admitting: *Deleted

## 2024-03-07 DIAGNOSIS — E669 Obesity, unspecified: Secondary | ICD-10-CM | POA: Diagnosis not present

## 2024-03-07 DIAGNOSIS — E1169 Type 2 diabetes mellitus with other specified complication: Secondary | ICD-10-CM | POA: Diagnosis not present

## 2024-03-07 DIAGNOSIS — G4733 Obstructive sleep apnea (adult) (pediatric): Secondary | ICD-10-CM | POA: Diagnosis not present

## 2024-03-07 DIAGNOSIS — I5022 Chronic systolic (congestive) heart failure: Secondary | ICD-10-CM | POA: Diagnosis not present

## 2024-03-07 DIAGNOSIS — K5909 Other constipation: Secondary | ICD-10-CM | POA: Diagnosis not present

## 2024-03-07 DIAGNOSIS — R809 Proteinuria, unspecified: Secondary | ICD-10-CM | POA: Diagnosis not present

## 2024-03-07 DIAGNOSIS — M810 Age-related osteoporosis without current pathological fracture: Secondary | ICD-10-CM | POA: Diagnosis not present

## 2024-03-07 DIAGNOSIS — E662 Morbid (severe) obesity with alveolar hypoventilation: Secondary | ICD-10-CM | POA: Diagnosis not present

## 2024-03-07 DIAGNOSIS — I10 Essential (primary) hypertension: Secondary | ICD-10-CM | POA: Diagnosis not present

## 2024-03-07 DIAGNOSIS — E782 Mixed hyperlipidemia: Secondary | ICD-10-CM | POA: Diagnosis not present

## 2024-03-07 DIAGNOSIS — Z9181 History of falling: Secondary | ICD-10-CM | POA: Diagnosis not present

## 2024-03-07 DIAGNOSIS — K219 Gastro-esophageal reflux disease without esophagitis: Secondary | ICD-10-CM | POA: Diagnosis not present

## 2024-03-17 ENCOUNTER — Telehealth: Payer: Self-pay

## 2024-03-17 NOTE — Telephone Encounter (Signed)
 Copied from CRM [id number]. Topic: Referral - Status >> Mar 17, 2024 10:19 AM Nathanel DEL wrote: Reason for CRM: pt states she received a call which told her she does not qualify for the [facility] Main Device. Pt would like to speak w/ someone and go over this procedure and another one she said she was to have. (But I do not see that information)  B/c if she doesn't qualify, then there is no need for this other procedure. She needs clarification and any procedure she needs or does not qualify for. (Not sure she knows what she needs/doesn't need)   I called and spoke to pt. Pt states she was suppose to go to a ENT to go down her nose to see if her airway is open. Pt states she was going to have Inspire put in but then heard she did not qualify but then was told by our reception that she did qualify. July 3rd is her ENT appointment for [facility] evaluation. Pt states Landry Ferrari, NP told her she may qualify for it but was not sure yet. I informed pt that if Landry was not sure about the qualifications, then that is probably why she was referred to the ENT. I informed pt that she needs to keep her ENT appointment, and when [facility] receives the results, she could determine if she can qualify for the [facility] or not. Pt verbalized understanding. NFN

## 2024-03-19 ENCOUNTER — Telehealth: Payer: Self-pay

## 2024-03-19 NOTE — Telephone Encounter (Signed)
 Copied from CRM [id number]. Topic: Medical Record Request - Provider/Facility Request >> Feb 26, 2024 12:05 PM Isabell A wrote: Reason for CRM:   Brooke from [employer] in regard to requesting a copy of patients CT chest results.  Fax no: [phone] Callback number: [phone]   I called [facility] and spoke to [place] to see if the copy of the CT was still needed. Lyle stated they already received a copy and nothing else was needed. NFN

## 2024-03-23 DIAGNOSIS — J9611 Chronic respiratory failure with hypoxia: Secondary | ICD-10-CM | POA: Diagnosis not present

## 2024-03-23 DIAGNOSIS — R0902 Hypoxemia: Secondary | ICD-10-CM | POA: Diagnosis not present

## 2024-03-24 DIAGNOSIS — M810 Age-related osteoporosis without current pathological fracture: Secondary | ICD-10-CM | POA: Diagnosis not present

## 2024-03-24 DIAGNOSIS — I10 Essential (primary) hypertension: Secondary | ICD-10-CM | POA: Diagnosis not present

## 2024-03-24 DIAGNOSIS — I5022 Chronic systolic (congestive) heart failure: Secondary | ICD-10-CM | POA: Diagnosis not present

## 2024-03-24 DIAGNOSIS — E1169 Type 2 diabetes mellitus with other specified complication: Secondary | ICD-10-CM | POA: Diagnosis not present

## 2024-03-27 ENCOUNTER — Ambulatory Visit (INDEPENDENT_AMBULATORY_CARE_PROVIDER_SITE_OTHER): Admitting: Otolaryngology

## 2024-03-27 VITALS — BP 156/76 | HR 80 | Wt 172.2 lb

## 2024-03-27 DIAGNOSIS — Z789 Other specified health status: Secondary | ICD-10-CM

## 2024-03-27 DIAGNOSIS — G4733 Obstructive sleep apnea (adult) (pediatric): Secondary | ICD-10-CM

## 2024-03-27 NOTE — Patient Instructions (Signed)
 Discuss Zepbound (Terzepatide) with your primary care doctor

## 2024-03-27 NOTE — Progress Notes (Signed)
 ENT CONSULT:  Reason for Consult: OSA and CPAP intolerance    HPI: Discussed the use of AI scribe software for clinical note transcription with the patient, who gave verbal consent to proceed.  History of Present Illness Kristina [name] is a 73 year old female with mild sleep apnea based on last sleep study AHI 3% of 14.5 and 4% AHI of 7.6, who presents for evaluation of her condition and treatment options. .  She uses oxygen  at night along with a CPAP mask, which she has to tighten significantly to get through the night. She experiences excessive sweating and a persistent cough upon waking. She has difficulty with the current CPAP setup, particularly with the mask fit and the need to breathe through her mouth.  She has tried different CPAP masks, including a nasal mask, but finds it challenging to keep her mouth closed during sleep. She is considering the use of a chin strap but is unsure of its comfort or effectiveness.  She has been on Mounjaro for approximately two months, prescribed by her primary care doctor, and has lost about seven pounds. She is concerned that the weight loss is not as significant as expected.     Records Reviewed:  Pulm visit 01/22/24  HPI: 73 year old female, never smoked. PMH significant for snoring, HTN, HFpEF, covid pneumonia, diabetes mellitus. Patient of Dr. Shellia.    Previous [facility] pulmonary encounter:  She is slowly getting used to wearing CPAP.  Doesn't like full face mask.  Pressure is okay.  She purchased home oxygen , but isn't using currently.  Sleeping better, and feels more alert.   07/24/2023 Patient presents today for OSA follow-up. She was last seen in July 2023 by Dr. Shellia, ordered for mask fitting. HST on 07/20/21 showed moderate-severe OSA with severe oxygen  desaturation; AHI 29.6/hr with SpO2 low 38% with an average of 75%.    She continues to struggle with CPAP compliance. No issues with pressure settings or mask fit. She denies trouble  sleeping at night while wearing, she will take her mask off when she wakes up to use the restroom and not put it back on. She is using a full face mask. Current pressure settings 5-20cm h20. She has a Designer, jewellery that she purchased on her down but is no longer using. DME is [employer].      Past Medical History:  Diagnosis Date   Acute respiratory distress syndrome (ARDS) due to COVID-19 virus (HCC) 09/2019   Chronic combined systolic and diastolic CHF (congestive heart failure) (HCC)    Diabetes mellitus, type 2 (HCC)    Dyspnea    with exertion   GERD (gastroesophageal reflux disease)    Hyperlipidemia    Hypertension    Sleep apnea     Past Surgical History:  Procedure Laterality Date   COLONOSCOPY N/A 12/21/2016   Procedure: COLONOSCOPY;  Surgeon: Claudis RAYMOND Rivet, MD;  Location: [facility];  Service: Endoscopy;  Laterality: N/A;  930   KYPHOPLASTY N/A 11/13/2023   Procedure: - Thoracic eleven - Lumbar one kyphoplasty;  Surgeon: Debby Dorn MATSU, MD;  Location: [facility] OR;  Service: Neurosurgery;  Laterality: N/A;   ORIF ACETABULAR FRACTURE Left 03/16/2023   Procedure: OPEN REDUCTION INTERNAL FIXATION (ORIF) ACETABULAR FRACTURE STOPPA APPROACH;  Surgeon: Kendal Franky SQUIBB, MD;  Location: [facility] OR;  Service: Orthopedics;  Laterality: Left;    Family History  Problem Relation Age of Onset   Breast cancer Mother    Heart attack Father  Colon cancer Neg Hx     Social History:  reports that she has never smoked. She has never used smokeless tobacco. She reports that she does not drink alcohol and does not use drugs.  Allergies: No Known Allergies  Medications: I have reviewed the patient's current medications.  The PMH, PSH, Medications, Allergies, and SH were reviewed and updated.  ROS: Constitutional: Negative for fever, weight loss and weight gain. Cardiovascular: Negative for chest pain and dyspnea on exertion. Respiratory: Is not experiencing shortness of breath at  rest. Gastrointestinal: Negative for nausea and vomiting. Neurological: Negative for headaches. Psychiatric: The patient is not nervous/anxious  Blood pressure (!) 156/76, pulse 80, weight 172 lb 3.2 oz (78.1 kg), SpO2 93%. Body mass index is 35.99 kg/m.  PHYSICAL EXAM:  Exam: General: Well-developed, well-nourished Respiratory Respiratory effort: Equal inspiration and expiration without stridor Cardiovascular Peripheral Vascular: Warm extremities with equal color/perfusion Eyes: No nystagmus with equal extraocular motion bilaterally Neuro/Psych/Balance: Patient oriented to person, place, and time; Appropriate mood and affect; Gait is intact with no imbalance; Cranial nerves I-XII are intact Head and Face Inspection: Normocephalic and atraumatic without mass or lesion Palpation: Facial skeleton intact without bony stepoffs Salivary Glands: No mass or tenderness Facial Strength: Facial motility symmetric and full bilaterally ENT Pinna: External ear intact and fully developed External canal: Canal is patent with intact skin Tympanic Membrane: Clear and mobile External Nose: No scar or anatomic deformity Internal Nose: Septum is relatively straight. No polyp, or purulence. Mucosal edema and erythema present.  Bilateral inferior turbinate hypertrophy.  Lips, Teeth, and gums: Mucosa and teeth intact and viable TMJ: No pain to palpation with full mobility Oral cavity/oropharynx: No erythema or exudate, no lesions present Friedman IV tongue position no tonsillar tissue  Neck Neck and Trachea: Midline trachea without mass or lesion Thyroid : No mass or nodularity Lymphatics: No lymphadenopathy  Procedure: Patient declined scope exam  Studies Reviewed: Sleep study results 02/11/24 BMI 38 Diagnostic HST 07/20/21- AHI 27.6/hr, desaturation to 38%-75%.   The polysomnogram revealed a presence of - obstructive, - central, and - mixed apneas resulting in an Apnea index of - events per  hour.  There were 63 hypopneas (>=3% desaturation and/or arousal) resulting in an Apnea\Hypopnea Index (AHI >=3% desaturation and/or arousal) of 14.5 events per hour.  There were 33 hypopneas (>=4% desaturation) resulting in an Apnea\Hypopnea Index (AHI >=4% desaturation) of 7.6 events per hour.  There were 42 Respiratory Effort Related Arousals resulting in a RERA index of 9.7 events per hour. The Respiratory Disturbance Index is 24.2 events per hour.  The snore index was - events per hour.   Mean oxygen  saturation was 86.4%.  The lowest oxygen  saturation during sleep was 69.0%.  Time spent <=88% oxygen  saturation was 273.0 minutes (73.0%).     Encounter Diagnoses  Name Primary?   Obstructive sleep apnea Yes   Intolerance of continuous positive airway pressure (CPAP) ventilation     Assessment and Plan Assessment & Plan Obstructive Sleep Apnea, Mild Mild obstructive sleep apnea confirmed by polysomnography done in May of 2025. CPAP with nocturnal oxygen  used. CPAP mask fit issues reported. Limited weight loss with Mounjaro but BMI today was 35.99 down from 38 prior.  - Discuss tirzepatide (Zepbound) with primary care physician for weight loss and sleep apnea management. - Include medication information in after visit summary. - we also discussed that she is not a candidate for Inspire based on sleep study results.       Thank you  for allowing me to participate in the care of this patient. Please do not hesitate to contact me with any questions or concerns.   Elena Larry, MD Otolaryngology [facility] Phone: [phone] Fax: [phone]    03/27/2024, 8:32 AM

## 2024-04-04 DIAGNOSIS — K5909 Other constipation: Secondary | ICD-10-CM | POA: Diagnosis not present

## 2024-04-04 DIAGNOSIS — E1169 Type 2 diabetes mellitus with other specified complication: Secondary | ICD-10-CM | POA: Diagnosis not present

## 2024-04-04 DIAGNOSIS — G4733 Obstructive sleep apnea (adult) (pediatric): Secondary | ICD-10-CM | POA: Diagnosis not present

## 2024-04-04 DIAGNOSIS — M810 Age-related osteoporosis without current pathological fracture: Secondary | ICD-10-CM | POA: Diagnosis not present

## 2024-04-04 DIAGNOSIS — E782 Mixed hyperlipidemia: Secondary | ICD-10-CM | POA: Diagnosis not present

## 2024-04-04 DIAGNOSIS — E662 Morbid (severe) obesity with alveolar hypoventilation: Secondary | ICD-10-CM | POA: Diagnosis not present

## 2024-04-04 DIAGNOSIS — I10 Essential (primary) hypertension: Secondary | ICD-10-CM | POA: Diagnosis not present

## 2024-04-04 DIAGNOSIS — K219 Gastro-esophageal reflux disease without esophagitis: Secondary | ICD-10-CM | POA: Diagnosis not present

## 2024-04-04 DIAGNOSIS — I5022 Chronic systolic (congestive) heart failure: Secondary | ICD-10-CM | POA: Diagnosis not present

## 2024-04-04 DIAGNOSIS — Z9181 History of falling: Secondary | ICD-10-CM | POA: Diagnosis not present

## 2024-04-04 DIAGNOSIS — R809 Proteinuria, unspecified: Secondary | ICD-10-CM | POA: Diagnosis not present

## 2024-04-04 DIAGNOSIS — E669 Obesity, unspecified: Secondary | ICD-10-CM | POA: Diagnosis not present

## 2024-04-07 ENCOUNTER — Encounter (HOSPITAL_BASED_OUTPATIENT_CLINIC_OR_DEPARTMENT_OTHER): Admitting: Internal Medicine

## 2024-04-17 ENCOUNTER — Telehealth: Payer: Self-pay | Admitting: *Deleted

## 2024-04-17 DIAGNOSIS — R7303 Prediabetes: Secondary | ICD-10-CM | POA: Diagnosis not present

## 2024-04-17 DIAGNOSIS — E785 Hyperlipidemia, unspecified: Secondary | ICD-10-CM | POA: Diagnosis not present

## 2024-04-17 NOTE — Progress Notes (Signed)
 Complex Care Management Care Guide Note  04/17/2024 Name: Kristina [name] MRN: [id number] DOB: 1950-11-10  Kristina [name] is a 73 y.o. year old female who is a primary care patient of Shona, Norleen PEDLAR, MD and is actively engaged with the care management team. I reached out to Kristina [name] by phone today to assist with re-scheduling  with the RN Case Manager.  Follow up plan: Unsuccessful telephone outreach attempt made. A HIPAA compliant phone message was left for the patient providing contact information and requesting a return call.  Thedford Franks, CMA [place]  [facility], [facility] Guide Direct Dial: [phone]  Fax: [phone] Website: [email or web address]

## 2024-04-22 DIAGNOSIS — R0902 Hypoxemia: Secondary | ICD-10-CM | POA: Diagnosis not present

## 2024-04-22 DIAGNOSIS — J9611 Chronic respiratory failure with hypoxia: Secondary | ICD-10-CM | POA: Diagnosis not present

## 2024-04-23 ENCOUNTER — Ambulatory Visit: Admitting: Primary Care

## 2024-04-23 ENCOUNTER — Ambulatory Visit (HOSPITAL_COMMUNITY)
Admission: RE | Admit: 2024-04-23 | Discharge: 2024-04-23 | Disposition: A | Discharge status: Home / Self Care | Source: Ambulatory Visit | Attending: Family Medicine | Admitting: Family Medicine

## 2024-04-23 ENCOUNTER — Other Ambulatory Visit (HOSPITAL_COMMUNITY): Payer: Self-pay | Admitting: Family Medicine

## 2024-04-23 DIAGNOSIS — G4733 Obstructive sleep apnea (adult) (pediatric): Secondary | ICD-10-CM | POA: Diagnosis not present

## 2024-04-23 DIAGNOSIS — R2689 Other abnormalities of gait and mobility: Secondary | ICD-10-CM | POA: Diagnosis not present

## 2024-04-23 DIAGNOSIS — E119 Type 2 diabetes mellitus without complications: Secondary | ICD-10-CM | POA: Diagnosis not present

## 2024-04-23 DIAGNOSIS — E785 Hyperlipidemia, unspecified: Secondary | ICD-10-CM | POA: Diagnosis not present

## 2024-04-23 DIAGNOSIS — I509 Heart failure, unspecified: Secondary | ICD-10-CM | POA: Diagnosis not present

## 2024-04-23 DIAGNOSIS — I110 Hypertensive heart disease with heart failure: Secondary | ICD-10-CM | POA: Diagnosis not present

## 2024-04-23 DIAGNOSIS — M810 Age-related osteoporosis without current pathological fracture: Secondary | ICD-10-CM | POA: Diagnosis not present

## 2024-04-23 DIAGNOSIS — M25551 Pain in right hip: Secondary | ICD-10-CM | POA: Insufficient documentation

## 2024-04-23 DIAGNOSIS — R0902 Hypoxemia: Secondary | ICD-10-CM | POA: Diagnosis not present

## 2024-04-23 DIAGNOSIS — K219 Gastro-esophageal reflux disease without esophagitis: Secondary | ICD-10-CM | POA: Diagnosis not present

## 2024-04-23 DIAGNOSIS — K5909 Other constipation: Secondary | ICD-10-CM | POA: Diagnosis not present

## 2024-04-23 NOTE — Progress Notes (Signed)
 Complex Care Management Care Guide Note  04/23/2024 Name: NAKSHATRA KLOSE MRN: [id number] DOB: 03-31-1951  Monta VEAR Duos is a 73 y.o. year old female who is a primary care patient of Shona, Norleen PEDLAR, MD and is actively engaged with the care management team. I reached out to Monta VEAR Duos by phone today to assist with re-scheduling  with the RN Case Manager.  Follow up plan: Unsuccessful telephone outreach attempt made. A HIPAA compliant phone message was left for the patient providing contact information and requesting a return call. No further outreach attempts will be made due to inability to maintain patient contact   Thedford Franks, CMA [facility]  [facility], [facility] Guide Direct Dial: [phone]  Fax: [phone] Website: [email or web address]

## 2024-04-24 ENCOUNTER — Encounter: Payer: Self-pay | Admitting: Primary Care

## 2024-04-24 ENCOUNTER — Ambulatory Visit: Admitting: Primary Care

## 2024-04-24 VITALS — BP 130/68 | HR 77 | Temp 97.7°F | Ht <= 58 in | Wt 170.6 lb

## 2024-04-24 DIAGNOSIS — J9611 Chronic respiratory failure with hypoxia: Secondary | ICD-10-CM | POA: Diagnosis not present

## 2024-04-24 DIAGNOSIS — G4733 Obstructive sleep apnea (adult) (pediatric): Secondary | ICD-10-CM | POA: Diagnosis not present

## 2024-04-24 DIAGNOSIS — I10 Essential (primary) hypertension: Secondary | ICD-10-CM | POA: Diagnosis not present

## 2024-04-24 DIAGNOSIS — J984 Other disorders of lung: Secondary | ICD-10-CM | POA: Diagnosis not present

## 2024-04-24 DIAGNOSIS — E1165 Type 2 diabetes mellitus with hyperglycemia: Secondary | ICD-10-CM | POA: Diagnosis not present

## 2024-04-24 DIAGNOSIS — I5022 Chronic systolic (congestive) heart failure: Secondary | ICD-10-CM | POA: Diagnosis not present

## 2024-04-24 DIAGNOSIS — E782 Mixed hyperlipidemia: Secondary | ICD-10-CM | POA: Diagnosis not present

## 2024-04-24 NOTE — Patient Instructions (Addendum)
  VISIT SUMMARY: During your visit, we discussed your ongoing issues with obstructive sleep apnea and difficulties tolerating CPAP therapy. We also reviewed your mild chronic obstructive pulmonary disease (COPD), your progress with weight loss therapy, and your need for oxygen  supplementation due to chronic respiratory failure with hypoxia.  YOUR PLAN: -OBSTRUCTIVE SLEEP APNEA WITH CPAP INTOLERANCE AND OXYGEN  REQUIREMENT: Obstructive sleep apnea is a condition where your airway becomes blocked during sleep, causing breathing interruptions. We will change your CPAP pressure settings to auto settings ranging from 10 to 20 to improve your comfort. An oxygen  adapter for your CPAP will be requested to help integrate oxygen  supplementation. We will discontinue automatic CPAP supply renewals and instead fill supplies upon request. It's important to regularly replace CPAP supplies to prevent bacterial growth: mask cushion monthly, filter monthly, tubing every three months, and water  chamber and headgear every six months. Follow up with Doctor Alva in six months to reassess your candidacy for Inspire therapy if your weight loss continues.  -CHRONIC OBSTRUCTIVE PULMONARY DISEASE (COPD), MILD, ASYMPTOMATIC: COPD is a chronic lung disease that makes it hard to breathe. Your condition is mild and currently asymptomatic, meaning you do not have noticeable symptoms. Continue your weight loss efforts as this may help improve your lung function. No inhalers or medication are needed at this time.  -OBESITY WITH ONGOING WEIGHT LOSS THERAPY: Obesity is a condition where excess body fat may negatively affect your health. You have lost approximately 11 pounds with ongoing weight loss therapy and medication. Continue your current weight loss therapy and medication, and keep up the good work with your weight loss efforts.  -CHRONIC RESPIRATORY FAILURE WITH HYPOXIA: Chronic respiratory failure with hypoxia means your lungs are not  getting enough oxygen  into your blood. You need to use oxygen  at home, primarily at night. We will ensure you have an oxygen  adapter for your CPAP to integrate oxygen  supplementation. Continue using oxygen  as prescribed.  Follow-up Please schedule visit with Dr. Jude at our [place] location (new-patient) in six months to reassess your candidacy for Inspire therapy if your weight loss continues.

## 2024-04-24 NOTE — Progress Notes (Signed)
 @Patient  ID: Kristina [name], female    DOB: November 30, 1950, 73 y.o.   MRN: [id number]  Chief Complaint  Patient presents with   Follow-up    CPAP f/u, Respiratory failure- sleeps with 2L O2     Referring provider: Shona Norleen PEDLAR, MD  HPI: 73 year old female, never smoked. PMH significant for snoring, HTN, HFpEF, covid pneumonia, diabetes mellitus. Patient of Dr. Shellia.   Previous [facility] pulmonary encounter:  She is slowly getting used to wearing CPAP.  Doesn't like full face mask.  Pressure is okay.  She purchased home oxygen , but isn't using currently.  Sleeping better, and feels more alert.  07/24/2023 Patient presents today for OSA follow-up. She was last seen in July 2023 by Dr. Shellia, ordered for mask fitting. HST on 07/20/21 showed moderate-severe OSA with severe oxygen  desaturation; AHI 29.6/hr with SpO2 low 38% with an average of 75%.   She continues to struggle with CPAP compliance. No issues with pressure settings or mask fit. She denies trouble sleeping at night while wearing, she will take her mask off when she wakes up to use the restroom and not put it back on. She is using a full face mask. Current pressure settings 5-20cm h20. She has a Designer, jewellery that she purchased on her down but is no longer using. DME is [employer].   Airview download 06/23/23-07/22/23 Usage days 28/30 days (93%); 2 days (7%) Average usage days used 2 hours 7 minutes Pressure 5 to 20 cm H2O (8.7 cm H2O-95%) Air leaks 42L/min (95%)  AHI 1.0   01/22/2024 Discussed the use of AI scribe software for clinical note transcription with the patient, who gave verbal consent to proceed.  History of Present Illness   Kristina [name] is a 73 year old female with moderate to severe sleep apnea who presents with difficulty tolerating CPAP therapy.  She was diagnosed with moderate to severe sleep apnea in October 2022, with a sleep study showing an apnea-hypopnea index (AHI) of 20 and oxygen  levels dropping as low  as 38%, with an average of 75%. She struggles with CPAP compliance, often removing the mask during the night due to symptoms such as a cough, rhinorrhea, and diaphoresis, which contribute to her difficulty in using the device consistently.  She has a history of COVID-19 in 2021, which she associates with the onset of her dyspnea. She experiences dyspnea during exertion, such as walking, and uses a portable oxygen  concentrator at home which she purchased on her own. Her oxygen  levels drop but can be managed with deep breaths. A pulmonary function test in March showed moderate restrictive lung disease, and a chest x-ray in January 2025 revealed low lung volumes, a small left pleural effusion, and mild pulmonary interstitial edema. A CTA in 2022 showed clear lungs with mild atelectasis.  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 Her echocardiogram in January 2025 indicated mild left ventricular hypertrophy and grade one diastolic dysfunction, with normal heart function at 65-70%. She is under the care of a cardiologist.  No history of COPD, asthma, or smoking. Her BMI is 38, and she acknowledges that weight loss could potentially improve her symptoms. She uses a portable oxygen  concentrator and is considering obtaining a home concentrator for better management of her oxygen  needs.      04/24/2024- Interim hx  Discussed the use of AI scribe software for clinical note transcription with the patient, who gave verbal consent to  proceed.  History of Present Illness   Kristina [name] is a 73 year old female with moderate to severe obstructive sleep apnea who presents with difficulty tolerating CPAP therapy.  She has a history of moderate to severe obstructive sleep apnea diagnosed in October 2022, with an average of 20 events per hour. She has difficulty tolerating CPAP therapy due to nasal symptoms, discomfort, and sweating. A CPAP titration study in May indicated  a need for a higher pressure setting of 17, compared to her previous setting of 9. During the study, significant oxygen  desaturations were noted, necessitating supplemental oxygen . She currently uses oxygen  at home in conjunction with her CPAP.  She wakes up sweating and finds the CPAP equipment wet, leading her to remove it. Additionally, she experiences issues when she has a cold, as a runny nose requires her to remove the CPAP mask. She has financial concerns regarding the cost of oxygen  and CPAP supplies, noting that her insurance covers 80% but she is responsible for 20%. She is frustrated with frequent billing for CPAP supplies, which she feels are unnecessary replacements.  She has been losing weight, approximately 11 pounds, and is on medication to aid in weight loss. She has a history of mild obstructive lung disease, with a CT scan showing no fibrosis but some air trapping. No significant shortness of breath, except for mild exertional dyspnea. She has never smoked.  She is currently taking Lasix  as a diuretic and reports no swelling in her legs. She does not use any inhalers and does not experience significant respiratory symptoms.      Airview compliance download 01/24/24-04/22/24 Usage days 89/90 days (99%); 77 days (86%) > 4 hours Average usage 6 hours 5 mins Pressure 9cm h20 Airleaks 97.3L/min (95%) AHI 1.7   No Known Allergies  Immunization History  Administered Date(s) Administered   Moderna Sars-Covid-2 Vaccination 04/02/2020, 04/30/2020    Past Medical History:  Diagnosis Date   Acute respiratory distress syndrome (ARDS) due to COVID-19 virus (HCC) 09/2019   Chronic combined systolic and diastolic CHF (congestive heart failure) (HCC)    Diabetes mellitus, type 2 (HCC)    Dyspnea    with exertion   GERD (gastroesophageal reflux disease)    Hyperlipidemia    Hypertension    Sleep apnea     Tobacco History: Social History   Tobacco Use  Smoking Status Never   Smokeless Tobacco Never   Counseling given: Not Answered   Outpatient Medications Prior to Visit  Medication Sig Dispense Refill   acetaminophen  (TYLENOL ) 325 MG tablet Take 2 tablets (650 mg total) by mouth every 6 (six) hours as needed for mild pain (or Fever >/= 101). 12 tablet 0   amLODipine  (NORVASC ) 10 MG tablet Take 1 tablet (10 mg total) by mouth daily. 30 tablet 3   carvedilol  (COREG ) 6.25 MG tablet TAKE 1 TABLET TWICE A DAY 180 tablet 2   denosumab  (PROLIA ) 60 MG/ML SOSY injection INJECT 60 MG INTO THE SKIN EVERY 6 MONTHS.     docusate sodium  (COLACE) 100 MG capsule Take 1 capsule (100 mg total) by mouth 2 (two) times daily. 10 capsule 0   empagliflozin  (JARDIANCE ) 10 MG TABS tablet Take 1 tablet (10 mg total) by mouth daily before breakfast. 90 tablet 3   furosemide  (LASIX ) 40 MG tablet Take 1 tablet (40 mg total) by mouth daily. 30 tablet 2   losartan  (COZAAR ) 100 MG tablet Take 1 tablet (100 mg total) by mouth daily. 30 tablet 5  methocarbamol  (ROBAXIN ) 500 MG tablet Take 1 tablet (500 mg total) by mouth every 6 (six) hours as needed for muscle spasms. 10 tablet 0   Multiple Vitamin (MULTIVITAMIN) tablet Take 1 tablet by mouth daily.     omeprazole (PRILOSEC) 40 MG capsule Take 40 mg by mouth daily.     rosuvastatin  (CRESTOR ) 10 MG tablet Take 10 mg by mouth daily.     SHINGRIX injection      VASCEPA  1 g capsule Take 2 g by mouth 2 (two) times daily.     fluconazole  (DIFLUCAN ) 150 MG tablet TAKE 1 TABLET BY MOUTH NOW THEN 1 IN 3 DAYS (Patient not taking: Reported on 04/24/2024)     LINZESS  145 MCG CAPS capsule Take 145 mcg by mouth daily. (Patient not taking: Reported on 04/24/2024)     No facility-administered medications prior to visit.   Review of Systems  Review of Systems  Constitutional: Negative.   HENT: Negative.    Respiratory: Negative.    Cardiovascular: Negative.     Physical Exam  BP 130/68 (BP Location: Right Arm, Patient Position: Sitting, Cuff Size:  Normal)   Pulse 77   Temp 97.7 F (36.5 C) (Temporal)   Ht 4' 10 (1.473 m)   Wt 170 lb 9.6 oz (77.4 kg)   SpO2 97%   BMI 35.66 kg/m  Physical Exam Constitutional:      General: She is not in acute distress.    Appearance: Normal appearance. She is not ill-appearing.  HENT:     Head: Normocephalic and atraumatic.  Cardiovascular:     Rate and Rhythm: Normal rate and regular rhythm.  Pulmonary:     Effort: Pulmonary effort is normal.     Breath sounds: Normal breath sounds.  Musculoskeletal:        General: Normal range of motion.  Skin:    General: Skin is warm and dry.  Neurological:     General: No focal deficit present.     Mental Status: She is alert and oriented to person, place, and time. Mental status is at baseline.  Psychiatric:        Mood and Affect: Mood normal.        Behavior: Behavior normal.        Thought Content: Thought content normal.        Judgment: Judgment normal.      Lab Results:  CBC    Component Value Date/Time   WBC 9.5 10/24/2023 0431   RBC 4.67 10/24/2023 0431   HGB 13.2 10/24/2023 0431   HCT 45.3 10/24/2023 0431   PLT 201 10/24/2023 0431   MCV 97.0 10/24/2023 0431   MCH 28.3 10/24/2023 0431   MCHC 29.1 (L) 10/24/2023 0431   RDW 13.8 10/24/2023 0431   LYMPHSABS 1.5 03/14/2023 1340   MONOABS 0.6 03/14/2023 1340   EOSABS 0.1 03/14/2023 1340   BASOSABS 0.1 03/14/2023 1340    BMET    Component Value Date/Time   NA 139 10/25/2023 0413   K 3.6 10/25/2023 0413   CL 96 (L) 10/25/2023 0413   CO2 34 (H) 10/25/2023 0413   GLUCOSE 118 (H) 10/25/2023 0413   BUN 17 10/25/2023 0413   CREATININE 0.43 (L) 10/25/2023 0413   CALCIUM  8.3 (L) 10/25/2023 0413   GFRNONAA >60 10/25/2023 0413   GFRAA >60 10/11/2019 0440    BNP    Component Value Date/Time   BNP 70.0 10/23/2023 1219    ProBNP No results found for: PROBNP  Imaging:  No results found.   Assessment & Plan:   1. OSA (obstructive sleep apnea) (Primary) -  Ambulatory Referral for DME  2. Chronic respiratory failure with hypoxia (HCC)  3. Restrictive lung disease   Assessment and Plan    Obstructive sleep apnea  Moderate obstructive sleep apnea diagnosed in October 2022 with an average of 20 events per hour. Difficulty tolerating CPAP due to nasal symptoms, discomfort, and sweating. CPAP titration study in May showed a need for a pressure of 17 and significant oxygen  desaturations, leading to oxygen  supplementation. Current CPAP pressure is 9, and the study suggested a higher pressure or auto settings from 10 to 20. CPAP supplies are recommended to be replaced regularly to prevent bacterial growth. Inspire therapy was discussed as an alternative, but current BMI, oxygen  requirements, and paralyzed diaphragm make CPAP the preferred option. - Change CPAP pressure settings to auto settings 10 to 20. - Request an oxygen  adapter for CPAP to integrate oxygen  supplementation. - Please provide [employer] Simplus nasal/oral full-face mask size small  - Discontinue auto CPAP supply renewal and fill supplies upon request per patient request  - Educate on the importance of regular replacement of CPAP supplies: mask cushion monthly, filter monthly, tubing every three months, water  chamber and headgear every six months. - Recommend follow-up with Doctor Jude in six months to reassess candidacy for Inspire therapy if weight loss continues.  Restrictive lung disease Moderate restrictive lung disease noted on previous breathing tests. HRCT scan showed no fibrosis but evidence of air trapping. She is asymptomatic, not requiring daily bronchodilator regimen. No current symptoms of shortness of breath, and no inhaler use. Weight loss may help reduce lung restriction. - Continue weight loss efforts to potentially improve lung function. - No need for inhalers or medication at this time due to lack of symptoms.  Obesity with ongoing weight loss therapy Lost  approximately 11 pounds. Weight loss may improve obstructive sleep apnea and COPD symptoms. - Encourage ongoing weight loss efforts.  Chronic respiratory failure with hypoxia Chronic respiratory failure with hypoxia requiring 2l oxygen  supplementation with exertion and 4l at bedtime. Oxygen  used at home, primarily at night.  - Ensure oxygen  adapter for CPAP is provided to integrate oxygen  supplementation.   Almarie LELON Ferrari, NP 04/24/2024

## 2024-05-02 DIAGNOSIS — E782 Mixed hyperlipidemia: Secondary | ICD-10-CM | POA: Diagnosis not present

## 2024-05-02 DIAGNOSIS — I5022 Chronic systolic (congestive) heart failure: Secondary | ICD-10-CM | POA: Diagnosis not present

## 2024-05-02 DIAGNOSIS — I10 Essential (primary) hypertension: Secondary | ICD-10-CM | POA: Diagnosis not present

## 2024-05-02 DIAGNOSIS — K5909 Other constipation: Secondary | ICD-10-CM | POA: Diagnosis not present

## 2024-05-02 DIAGNOSIS — M810 Age-related osteoporosis without current pathological fracture: Secondary | ICD-10-CM | POA: Diagnosis not present

## 2024-05-02 DIAGNOSIS — E669 Obesity, unspecified: Secondary | ICD-10-CM | POA: Diagnosis not present

## 2024-05-02 DIAGNOSIS — E1169 Type 2 diabetes mellitus with other specified complication: Secondary | ICD-10-CM | POA: Diagnosis not present

## 2024-05-02 DIAGNOSIS — R809 Proteinuria, unspecified: Secondary | ICD-10-CM | POA: Diagnosis not present

## 2024-05-02 DIAGNOSIS — E662 Morbid (severe) obesity with alveolar hypoventilation: Secondary | ICD-10-CM | POA: Diagnosis not present

## 2024-05-02 DIAGNOSIS — K219 Gastro-esophageal reflux disease without esophagitis: Secondary | ICD-10-CM | POA: Diagnosis not present

## 2024-05-02 DIAGNOSIS — G4733 Obstructive sleep apnea (adult) (pediatric): Secondary | ICD-10-CM | POA: Diagnosis not present

## 2024-05-02 DIAGNOSIS — Z9181 History of falling: Secondary | ICD-10-CM | POA: Diagnosis not present

## 2024-05-05 ENCOUNTER — Telehealth: Payer: Self-pay

## 2024-05-05 DIAGNOSIS — G4733 Obstructive sleep apnea (adult) (pediatric): Secondary | ICD-10-CM

## 2024-05-05 NOTE — Telephone Encounter (Signed)
 This is what the order states no mention of mask DME change CPAP pressure 10-20cm h20 Needs O2 adaptor for cpap  Discontinue auto CPAP supply renewal, fill upon patient request DME [employer]

## 2024-05-05 NOTE — Telephone Encounter (Signed)
 Copied from CRM [id number]. Topic: Clinical - Prescription Issue >> May 05, 2024 10:43 AM Shona RAMAN wrote: Reason for CRM: patient was supposed to receive her order for cpap masks but she havent received, prescription was sent 04/24/24, please call and provide dme company information ad status of order   Routing to [facility] group to help advise pt status of order.

## 2024-05-12 NOTE — Telephone Encounter (Signed)
 Yep that was the order I placed, I wasn't aware she needed new supplies. I will place a new order

## 2024-05-14 NOTE — Telephone Encounter (Signed)
 I called and spoke to pt. Pt informed of Beth's note and verbalized understanding. NFN

## 2024-05-23 DIAGNOSIS — E782 Mixed hyperlipidemia: Secondary | ICD-10-CM | POA: Diagnosis not present

## 2024-05-23 DIAGNOSIS — J9611 Chronic respiratory failure with hypoxia: Secondary | ICD-10-CM | POA: Diagnosis not present

## 2024-05-23 DIAGNOSIS — I10 Essential (primary) hypertension: Secondary | ICD-10-CM | POA: Diagnosis not present

## 2024-05-23 DIAGNOSIS — E1165 Type 2 diabetes mellitus with hyperglycemia: Secondary | ICD-10-CM | POA: Diagnosis not present

## 2024-05-23 DIAGNOSIS — I5022 Chronic systolic (congestive) heart failure: Secondary | ICD-10-CM | POA: Diagnosis not present

## 2024-05-23 DIAGNOSIS — R0902 Hypoxemia: Secondary | ICD-10-CM | POA: Diagnosis not present

## 2024-05-27 DIAGNOSIS — S32402D Unspecified fracture of left acetabulum, subsequent encounter for fracture with routine healing: Secondary | ICD-10-CM | POA: Diagnosis not present

## 2024-05-27 DIAGNOSIS — M545 Low back pain, unspecified: Secondary | ICD-10-CM | POA: Diagnosis not present

## 2024-05-27 DIAGNOSIS — E662 Morbid (severe) obesity with alveolar hypoventilation: Secondary | ICD-10-CM | POA: Diagnosis not present

## 2024-05-27 DIAGNOSIS — E1169 Type 2 diabetes mellitus with other specified complication: Secondary | ICD-10-CM | POA: Diagnosis not present

## 2024-05-27 DIAGNOSIS — I5022 Chronic systolic (congestive) heart failure: Secondary | ICD-10-CM | POA: Diagnosis not present

## 2024-05-27 DIAGNOSIS — M25551 Pain in right hip: Secondary | ICD-10-CM | POA: Diagnosis not present

## 2024-05-27 DIAGNOSIS — K219 Gastro-esophageal reflux disease without esophagitis: Secondary | ICD-10-CM | POA: Diagnosis not present

## 2024-05-27 DIAGNOSIS — E669 Obesity, unspecified: Secondary | ICD-10-CM | POA: Diagnosis not present

## 2024-05-27 DIAGNOSIS — M810 Age-related osteoporosis without current pathological fracture: Secondary | ICD-10-CM | POA: Diagnosis not present

## 2024-05-27 DIAGNOSIS — E782 Mixed hyperlipidemia: Secondary | ICD-10-CM | POA: Diagnosis not present

## 2024-05-27 DIAGNOSIS — K5909 Other constipation: Secondary | ICD-10-CM | POA: Diagnosis not present

## 2024-05-27 DIAGNOSIS — Z9181 History of falling: Secondary | ICD-10-CM | POA: Diagnosis not present

## 2024-05-27 DIAGNOSIS — G4733 Obstructive sleep apnea (adult) (pediatric): Secondary | ICD-10-CM | POA: Diagnosis not present

## 2024-05-27 DIAGNOSIS — I10 Essential (primary) hypertension: Secondary | ICD-10-CM | POA: Diagnosis not present

## 2024-06-13 DIAGNOSIS — M25551 Pain in right hip: Secondary | ICD-10-CM | POA: Diagnosis not present

## 2024-06-13 DIAGNOSIS — Z6833 Body mass index (BMI) 33.0-33.9, adult: Secondary | ICD-10-CM | POA: Diagnosis not present

## 2024-06-23 DIAGNOSIS — E782 Mixed hyperlipidemia: Secondary | ICD-10-CM | POA: Diagnosis not present

## 2024-06-23 DIAGNOSIS — R0902 Hypoxemia: Secondary | ICD-10-CM | POA: Diagnosis not present

## 2024-06-23 DIAGNOSIS — I10 Essential (primary) hypertension: Secondary | ICD-10-CM | POA: Diagnosis not present

## 2024-06-23 DIAGNOSIS — J9611 Chronic respiratory failure with hypoxia: Secondary | ICD-10-CM | POA: Diagnosis not present

## 2024-06-23 DIAGNOSIS — I5022 Chronic systolic (congestive) heart failure: Secondary | ICD-10-CM | POA: Diagnosis not present

## 2024-06-23 DIAGNOSIS — E1165 Type 2 diabetes mellitus with hyperglycemia: Secondary | ICD-10-CM | POA: Diagnosis not present

## 2024-06-24 DIAGNOSIS — E782 Mixed hyperlipidemia: Secondary | ICD-10-CM | POA: Diagnosis not present

## 2024-06-24 DIAGNOSIS — E1169 Type 2 diabetes mellitus with other specified complication: Secondary | ICD-10-CM | POA: Diagnosis not present

## 2024-06-24 DIAGNOSIS — M25551 Pain in right hip: Secondary | ICD-10-CM | POA: Diagnosis not present

## 2024-06-24 DIAGNOSIS — I10 Essential (primary) hypertension: Secondary | ICD-10-CM | POA: Diagnosis not present

## 2024-06-24 DIAGNOSIS — I5022 Chronic systolic (congestive) heart failure: Secondary | ICD-10-CM | POA: Diagnosis not present

## 2024-06-24 DIAGNOSIS — Z9181 History of falling: Secondary | ICD-10-CM | POA: Diagnosis not present

## 2024-06-24 DIAGNOSIS — G4733 Obstructive sleep apnea (adult) (pediatric): Secondary | ICD-10-CM | POA: Diagnosis not present

## 2024-06-24 DIAGNOSIS — K5909 Other constipation: Secondary | ICD-10-CM | POA: Diagnosis not present

## 2024-06-24 DIAGNOSIS — M810 Age-related osteoporosis without current pathological fracture: Secondary | ICD-10-CM | POA: Diagnosis not present

## 2024-06-24 DIAGNOSIS — K219 Gastro-esophageal reflux disease without esophagitis: Secondary | ICD-10-CM | POA: Diagnosis not present

## 2024-06-24 DIAGNOSIS — E669 Obesity, unspecified: Secondary | ICD-10-CM | POA: Diagnosis not present

## 2024-06-24 DIAGNOSIS — E662 Morbid (severe) obesity with alveolar hypoventilation: Secondary | ICD-10-CM | POA: Diagnosis not present

## 2024-07-16 DIAGNOSIS — I10 Essential (primary) hypertension: Secondary | ICD-10-CM | POA: Diagnosis not present

## 2024-07-16 DIAGNOSIS — Z9181 History of falling: Secondary | ICD-10-CM | POA: Diagnosis not present

## 2024-07-16 DIAGNOSIS — G4733 Obstructive sleep apnea (adult) (pediatric): Secondary | ICD-10-CM | POA: Diagnosis not present

## 2024-07-16 DIAGNOSIS — E669 Obesity, unspecified: Secondary | ICD-10-CM | POA: Diagnosis not present

## 2024-07-16 DIAGNOSIS — I5022 Chronic systolic (congestive) heart failure: Secondary | ICD-10-CM | POA: Diagnosis not present

## 2024-07-16 DIAGNOSIS — E782 Mixed hyperlipidemia: Secondary | ICD-10-CM | POA: Diagnosis not present

## 2024-07-16 DIAGNOSIS — M25551 Pain in right hip: Secondary | ICD-10-CM | POA: Diagnosis not present

## 2024-07-16 DIAGNOSIS — E1169 Type 2 diabetes mellitus with other specified complication: Secondary | ICD-10-CM | POA: Diagnosis not present

## 2024-07-16 DIAGNOSIS — K219 Gastro-esophageal reflux disease without esophagitis: Secondary | ICD-10-CM | POA: Diagnosis not present

## 2024-07-16 DIAGNOSIS — K5909 Other constipation: Secondary | ICD-10-CM | POA: Diagnosis not present

## 2024-07-16 DIAGNOSIS — M810 Age-related osteoporosis without current pathological fracture: Secondary | ICD-10-CM | POA: Diagnosis not present

## 2024-07-16 DIAGNOSIS — E662 Morbid (severe) obesity with alveolar hypoventilation: Secondary | ICD-10-CM | POA: Diagnosis not present

## 2024-07-22 ENCOUNTER — Encounter (HOSPITAL_COMMUNITY): Payer: Self-pay

## 2024-07-22 ENCOUNTER — Ambulatory Visit (HOSPITAL_COMMUNITY): Attending: Neurosurgery

## 2024-07-22 DIAGNOSIS — M25551 Pain in right hip: Secondary | ICD-10-CM | POA: Diagnosis not present

## 2024-07-22 DIAGNOSIS — R29898 Other symptoms and signs involving the musculoskeletal system: Secondary | ICD-10-CM | POA: Insufficient documentation

## 2024-07-22 NOTE — Therapy (Signed)
 OUTPATIENT PHYSICAL THERAPY LOWER EXTREMITY EVALUATION   Patient Name: Kristina [name] MRN: [id number] DOB:12/18/1950, 73 y.o., female Today's Date: 07/22/2024  END OF SESSION:  PT End of Session - 07/22/24 1114     Visit Number 1    Number of Visits 8    Date for Recertification  09/19/24    Authorization Type [employer] Medicare    Authorization Time Period no auth required    PT Start Time 1115    PT Stop Time 1158    PT Time Calculation (min) 43 min    Activity Tolerance Patient tolerated treatment well    Behavior During Therapy WFL for tasks assessed/performed          Past Medical History:  Diagnosis Date   Acute respiratory distress syndrome (ARDS) due to COVID-19 virus (HCC) 09/2019   Chronic combined systolic and diastolic CHF (congestive heart failure) (HCC)    Diabetes mellitus, type 2 (HCC)    Dyspnea    with exertion   GERD (gastroesophageal reflux disease)    Hyperlipidemia    Hypertension    Sleep apnea    Past Surgical History:  Procedure Laterality Date   COLONOSCOPY N/A 12/21/2016   Procedure: COLONOSCOPY;  Surgeon: Claudis RAYMOND Rivet, MD;  [facility]: [facility];  Service: Endoscopy;  Laterality: N/A;  930   KYPHOPLASTY N/A 11/13/2023   Procedure: - Thoracic eleven - Lumbar one kyphoplasty;  Surgeon: Debby Dorn MATSU, MD;  Location: [facility] OR;  Service: Neurosurgery;  Laterality: N/A;   ORIF ACETABULAR FRACTURE Left 03/16/2023   Procedure: OPEN REDUCTION INTERNAL FIXATION (ORIF) ACETABULAR FRACTURE STOPPA APPROACH;  Surgeon: Kendal Franky SQUIBB, MD;  Location: [facility] OR;  Service: Orthopedics;  Laterality: Left;   Patient Active Problem List   Diagnosis Date Noted   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 12/14/2023   Vaginal discharge 12/14/2023   Class 2 severe obesity due to excess calories with serious comorbidity and body mass index (BMI) of 38.0 to 38.9 in adult 10/25/2023   Acute exacerbation of congestive heart failure (HCC) 10/23/2023   Acute on chronic  respiratory failure with hypoxia (HCC) 10/23/2023   Unsteady gait when walking 09/05/2023   Low back pain 09/05/2023   Hypernatremia 09/05/2023   Other abnormalities of gait and mobility 09/05/2023   Chronic constipation 05/10/2023   Hemorrhoids 05/10/2023   Closed fracture of right acetabulum (HCC) 05/02/2023   Acetabular fracture (HCC) 03/14/2023   Candidiasis of skin 02/12/2023   Osteoporosis 01/22/2023   Proteinuria 06/15/2022   Sleep apnea 09/08/2021   Congestive heart failure (HCC) 07/29/2021   Hyperglycemia due to type 2 diabetes mellitus (HCC) 05/04/2021   Chronic systolic heart failure (HCC) 04/09/2021   Hypoxia    Acute hypoxemic respiratory failure (HCC) 04/05/2021   Dyspnea 04/05/2021   Tremor 04/05/2021   Impaired fasting glucose 01/25/2021   Gastroesophageal reflux disease 01/25/2021   Hyperlipidemia 01/25/2021   Morbid obesity (HCC) 01/25/2021   Prediabetes 01/25/2021   Tachycardia 01/25/2021   Pain of left hip joint 05/20/2020   Essential hypertension 10/08/2019   Uncontrolled diabetes mellitus 10/08/2019   UTI (urinary tract infection) 10/08/2019   Thrush 10/08/2019   Pneumonia due to COVID-19 virus 10/08/2019   Chronic respiratory failure with hypoxia (HCC) 10/02/2019   Acute respiratory distress syndrome (ARDS) due to COVID-19 virus (HCC) 10/02/2019   Pain in right shoulder 07/15/2018   Pain in right foot 07/15/2018   Special screening for malignant neoplasms, colon 11/06/2016    PCP: Shona Norleen PEDLAR,  MD  REFERRING PROVIDER: Debby Dorn MATSU, MD   REFERRING DIAG: Pain in right hip   THERAPY DIAG:  Pain in right hip  Weakness of both hips  Rationale for Evaluation and Treatment: Rehabilitation  ONSET DATE: a few months   SUBJECTIVE:   SUBJECTIVE STATEMENT: Patient reports that her right hip have been bothering her for a few months now. Her pain is primarily located on right lateral hip and it can go down her latera leg. She had never had any  pain like this before. She has not fallen or lost her balance in the past six months. However, there are times that her pain will get bad enough that she can't sleep.   PERTINENT HISTORY: History of COVID-19, HTN, diabetes, osteoporosis, and congestive heart failure PAIN:  Are you having pain? Yes: NPRS scale: Current: 1-2/10 Best: 0/10 Worst: 7-8/10 Pain location: right lateral hip  Pain description: aching Aggravating factors: sitting (about 30 minutes), getting into and out of vehicles Relieving factors: medication  PRECAUTIONS: None  RED FLAGS: None   WEIGHT BEARING RESTRICTIONS: No  FALLS:  Has patient fallen in last 6 months? No  LIVING ENVIRONMENT: Lives with: lives with their family Lives in: House/apartment Stairs: Yes: External: 3 steps; can reach both; step to pattern with left foot leading Has following equipment at home: Single point cane, Walker - 2 wheeled, and bed side commode  OCCUPATION: retired   PLOF: Independent  PATIENT GOALS: reduced pain, be able to exercise at her senior citizens exercise class  NEXT MD VISIT: none scheduled  OBJECTIVE:  Note: Objective measures were completed at Evaluation unless otherwise noted.  DIAGNOSTIC FINDINGS: 04/23/24 right hip x-ray  IMPRESSION: 1. No acute fracture or dislocation of the right hip.  PATIENT SURVEYS:  LEFS  Extreme difficulty/unable (0), Quite a bit of difficulty (1), Moderate difficulty (2), Little difficulty (3), No difficulty (4) Survey date:  07/22/24  Any of your usual work, housework or school activities 4  2. Usual hobbies, recreational or sporting activities 3  3. Getting into/out of the bath 4  4. Walking between rooms 4  5. Putting on socks/shoes 4  6. Squatting  0  7. Lifting an object, like a bag of groceries from the floor 4  8. Performing light activities around your home 4  9. Performing heavy activities around your home 0  10. Getting into/out of a car 3  11. Walking 2 blocks 3   12. Walking 1 mile 0  13. Going up/down 10 stairs (1 flight) 2  14. Standing for 1 hour 0  15.  sitting for 1 hour 4  16. Running on even ground 0  17. Running on uneven ground 0  18. Making sharp turns while running fast 0  19. Hopping  0  20. Rolling over in bed 3  Score total:  42/80     COGNITION: Overall cognitive status: Within functional limits for tasks assessed     SENSATION: Light touch: WFL Patient reports intermittent tingling in both legs, but none currently.   EDEMA:  No edema observed   PALPATION: No tenderness to palpation  JOINT MOBILITY:  Right hip: WFL and reproduced familiar pain  LOWER EXTREMITY ROM: WFL for activities assessed  LOWER EXTREMITY MMT:  MMT Right eval Left eval  Hip flexion 4-/5; :tender 4-/5; tender   Hip extension    Hip abduction    Hip adduction 5/5 5/5  Hip internal rotation    Hip external rotation  Knee flexion 4+/5 4+/5  Knee extension 4-/5 4/5  Ankle dorsiflexion 4/5 4/5  Ankle plantarflexion    Ankle inversion    Ankle eversion     (Blank rows = not tested)  LOWER EXTREMITY SPECIAL TESTS:  Hip special tests: Hip scouring test: negative  FUNCTIONAL TESTS:  5 times sit to stand: 19.44 seconds 2 minute walk test: 378 feet Rolling from left sidelying to supine: reproduced familiar pain   GAIT: Distance walked: 378 feet Assistive device utilized: None Level of assistance: Complete Independence Comments: trendelenburg                                                                                                                                 TREATMENT DATE:   07/22/24: PT evaluation, patient education,, and HEP   PATIENT EDUCATION:  Education details: HEP, POC, prognosis, healing, OA, anatomy, objective findings, and goals for physical therapy  Person educated: Patient Education method: Explanation, Demonstration, and Handouts Education comprehension: verbalized understanding and returned  demonstration  HOME EXERCISE PROGRAM: Access Code: [email or web address] URL: [email or web address] Date: 07/22/2024 Prepared by: Lacinda Fass  Exercises - Supine Lower Trunk Rotation  - 1 x daily - 7 x weekly - 3 sets - 10 reps - Supine Bridge  - 1 x daily - 7 x weekly - 3 sets - 10 reps  ASSESSMENT:  CLINICAL IMPRESSION: Patient is a 73 y.o. femal who was seen today for physical therapy evaluation and treatment for chronic right hip pain. She presented with low pain severity and irritability with right hip passive range of motion and joint mobility testing reproducing her familiar symptoms. She also exhibited reduced hip flexor strength bilaterally. She is at an elevated risk of falling as evidenced by her five time sit to stand time. Recommend that she continue with skilled physical therapy to address her impairments to return to her prior level of function.   OBJECTIVE IMPAIRMENTS: Abnormal gait, decreased activity tolerance, decreased strength, and pain.   ACTIVITY LIMITATIONS: carrying, squatting, sleeping, stairs, transfers, and locomotion level  PARTICIPATION LIMITATIONS: cleaning, laundry, shopping, community activity, and yard work  PERSONAL FACTORS: Past/current experiences, Time since onset of injury/illness/exacerbation, and 3+ comorbidities: History of COVID-19, HTN, diabetes, osteoporosis, and congestive heart failure are also affecting patient's functional outcome.   REHAB POTENTIAL: Good  CLINICAL DECISION MAKING: Evolving/moderate complexity  EVALUATION COMPLEXITY: Moderate   GOALS: Goals reviewed with patient? Yes  LONG TERM GOALS: Target date: 08/19/24  Patient will be independent with her HEP. Baseline:  Goal status: INITIAL  2.  Patient will improve her five time sit to stand time to 12 seconds or less to reduce her fall risk.  Baseline:  Goal status: INITIAL  3.  Patient will report being able to complete her daily activities without her familiar  pain exceeding 5/10. Baseline:  Goal status: INITIAL  4.  Patient will improve her two minute walk test distance by at least 40 feet for  improved community mobility.  Baseline:  Goal status: INITIAL  5.  Patient will improve her LEFS score by at least 10 point for improved perceived function with her daily activities.  Baseline:  Goal status: INITIAL  PLAN:  PT FREQUENCY: 2x/week  PT DURATION: 4 [place]  PLANNED INTERVENTIONS: 02835- PT Re-evaluation, 97750- Physical Performance Testing, 97110-Therapeutic exercises, 97530- Therapeutic activity, 97112- Neuromuscular re-education, [phone]- Self Care, 02859- Manual therapy, [phone]- Gait training, [phone]- Electrical stimulation (unattended), Patient/Family education, Balance training, Stair training, Joint mobilization, Cryotherapy, and Moist heat  PLAN FOR NEXT SESSION: review and update HEP (as needed), lower extremity strengthening, and educate on log rolling    Lacinda JAYSON Fass, PT 07/22/2024, 3:42 PM

## 2024-07-23 DIAGNOSIS — R0902 Hypoxemia: Secondary | ICD-10-CM | POA: Diagnosis not present

## 2024-07-23 DIAGNOSIS — J9611 Chronic respiratory failure with hypoxia: Secondary | ICD-10-CM | POA: Diagnosis not present

## 2024-07-24 DIAGNOSIS — E785 Hyperlipidemia, unspecified: Secondary | ICD-10-CM | POA: Diagnosis not present

## 2024-07-24 DIAGNOSIS — I509 Heart failure, unspecified: Secondary | ICD-10-CM | POA: Diagnosis not present

## 2024-07-25 DIAGNOSIS — E782 Mixed hyperlipidemia: Secondary | ICD-10-CM | POA: Diagnosis not present

## 2024-07-25 DIAGNOSIS — E662 Morbid (severe) obesity with alveolar hypoventilation: Secondary | ICD-10-CM | POA: Diagnosis not present

## 2024-07-25 DIAGNOSIS — I10 Essential (primary) hypertension: Secondary | ICD-10-CM | POA: Diagnosis not present

## 2024-07-25 DIAGNOSIS — E1165 Type 2 diabetes mellitus with hyperglycemia: Secondary | ICD-10-CM | POA: Diagnosis not present

## 2024-07-29 ENCOUNTER — Encounter (HOSPITAL_COMMUNITY): Payer: Self-pay

## 2024-07-29 ENCOUNTER — Ambulatory Visit (HOSPITAL_COMMUNITY): Attending: Neurosurgery

## 2024-07-29 DIAGNOSIS — M25551 Pain in right hip: Secondary | ICD-10-CM | POA: Insufficient documentation

## 2024-07-29 DIAGNOSIS — R29898 Other symptoms and signs involving the musculoskeletal system: Secondary | ICD-10-CM | POA: Insufficient documentation

## 2024-07-29 NOTE — Therapy (Signed)
 OUTPATIENT PHYSICAL THERAPY LOWER EXTREMITY TREATMENT   Patient Name: Kristina [name] MRN: [id number] DOB:06/18/1951, 73 y.o., female Today's Date: 07/29/2024  END OF SESSION:  PT End of Session - 07/29/24 0815     Visit Number 2    Number of Visits 8    Date for Recertification  09/19/24    Authorization Type [employer] Medicare    Authorization Time Period no auth required    PT Start Time 0815    PT Stop Time 0856    PT Time Calculation (min) 41 min    Activity Tolerance Patient tolerated treatment well    Behavior During Therapy [facility] for tasks assessed/performed           Past Medical History:  Diagnosis Date   Acute respiratory distress syndrome (ARDS) due to COVID-19 virus (HCC) 09/2019   Chronic combined systolic and diastolic CHF (congestive heart failure) (HCC)    Diabetes mellitus, type 2 (HCC)    Dyspnea    with exertion   GERD (gastroesophageal reflux disease)    Hyperlipidemia    Hypertension    Sleep apnea    Past Surgical History:  Procedure Laterality Date   COLONOSCOPY N/A 12/21/2016   Procedure: COLONOSCOPY;  Surgeon: Claudis RAYMOND Rivet, MD;  Location: [facility];  Service: Endoscopy;  Laterality: N/A;  930   KYPHOPLASTY N/A 11/13/2023   Procedure: - Thoracic eleven - Lumbar one kyphoplasty;  Surgeon: Debby Dorn MATSU, MD;  Location: [facility] OR;  Service: Neurosurgery;  Laterality: N/A;   ORIF ACETABULAR FRACTURE Left 03/16/2023   Procedure: OPEN REDUCTION INTERNAL FIXATION (ORIF) ACETABULAR FRACTURE STOPPA APPROACH;  Surgeon: Kendal Franky SQUIBB, MD;  Location: [facility] OR;  Service: Orthopedics;  Laterality: Left;   Patient Active Problem List   Diagnosis Date Noted   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 12/14/2023   Vaginal discharge 12/14/2023   Class 2 severe obesity due to excess calories with serious comorbidity and body mass index (BMI) of 38.0 to 38.9 in adult 10/25/2023   Acute exacerbation of congestive heart failure (HCC) 10/23/2023   Acute on chronic  respiratory failure with hypoxia (HCC) 10/23/2023   Unsteady gait when walking 09/05/2023   Low back pain 09/05/2023   Hypernatremia 09/05/2023   Other abnormalities of gait and mobility 09/05/2023   Chronic constipation 05/10/2023   Hemorrhoids 05/10/2023   Closed fracture of right acetabulum (HCC) 05/02/2023   Acetabular fracture (HCC) 03/14/2023   Candidiasis of skin 02/12/2023   Osteoporosis 01/22/2023   Proteinuria 06/15/2022   Sleep apnea 09/08/2021   Congestive heart failure (HCC) 07/29/2021   Hyperglycemia due to type 2 diabetes mellitus (HCC) 05/04/2021   Chronic systolic heart failure (HCC) 04/09/2021   Hypoxia    Acute hypoxemic respiratory failure (HCC) 04/05/2021   Dyspnea 04/05/2021   Tremor 04/05/2021   Impaired fasting glucose 01/25/2021   Gastroesophageal reflux disease 01/25/2021   Hyperlipidemia 01/25/2021   Morbid obesity (HCC) 01/25/2021   Prediabetes 01/25/2021   Tachycardia 01/25/2021   Pain of left hip joint 05/20/2020   Essential hypertension 10/08/2019   Uncontrolled diabetes mellitus 10/08/2019   UTI (urinary tract infection) 10/08/2019   Thrush 10/08/2019   Pneumonia due to COVID-19 virus 10/08/2019   Chronic respiratory failure with hypoxia (HCC) 10/02/2019   Acute respiratory distress syndrome (ARDS) due to COVID-19 virus (HCC) 10/02/2019   Pain in right shoulder 07/15/2018   Pain in right foot 07/15/2018   Special screening for malignant neoplasms, colon 11/06/2016    PCP: Shona Rush  Z, MD  REFERRING PROVIDER: Debby Dorn MATSU, MD   REFERRING DIAG: Pain in right hip   THERAPY DIAG:  Pain in right hip  Weakness of both hips  Rationale for Evaluation and Treatment: Rehabilitation  ONSET DATE: a few months   SUBJECTIVE:   SUBJECTIVE STATEMENT: Patient reports that she is hurting a little this morning. She has been doing her HEP.   Eval: Patient reports that her right hip have been bothering her for a few months now. Her pain  is primarily located on right lateral hip and it can go down her latera leg. She had never had any pain like this before. She has not fallen or lost her balance in the past six months. However, there are times that her pain will get bad enough that she can't sleep.   PERTINENT HISTORY: History of COVID-19, HTN, diabetes, osteoporosis, and congestive heart failure PAIN:  Are you having pain? Yes: NPRS scale: Current: 3/10 Best: 0/10 Worst: 7-8/10 Pain location: right lateral hip  Pain description: aching Aggravating factors: sitting (about 30 minutes), getting into and out of vehicles Relieving factors: medication  PRECAUTIONS: None  RED FLAGS: None   WEIGHT BEARING RESTRICTIONS: No  FALLS:  Has patient fallen in last 6 months? No  LIVING ENVIRONMENT: Lives with: lives with their family Lives in: House/apartment Stairs: Yes: External: 3 steps; can reach both; step to pattern with left foot leading Has following equipment at home: Single point cane, Walker - 2 wheeled, and bed side commode  OCCUPATION: retired   PLOF: Independent  PATIENT GOALS: reduced pain, be able to exercise at her senior citizens exercise class  NEXT MD VISIT: none scheduled  OBJECTIVE:  Note: Objective measures were completed at Evaluation unless otherwise noted.  DIAGNOSTIC FINDINGS: 04/23/24 right hip x-ray  IMPRESSION: 1. No acute fracture or dislocation of the right hip.  PATIENT SURVEYS:  LEFS  Extreme difficulty/unable (0), Quite a bit of difficulty (1), Moderate difficulty (2), Little difficulty (3), No difficulty (4) Survey date:  07/22/24  Any of your usual work, housework or school activities 4  2. Usual hobbies, recreational or sporting activities 3  3. Getting into/out of the bath 4  4. Walking between rooms 4  5. Putting on socks/shoes 4  6. Squatting  0  7. Lifting an object, like a bag of groceries from the floor 4  8. Performing light activities around your home 4  9.  Performing heavy activities around your home 0  10. Getting into/out of a car 3  11. Walking 2 blocks 3  12. Walking 1 mile 0  13. Going up/down 10 stairs (1 flight) 2  14. Standing for 1 hour 0  15.  sitting for 1 hour 4  16. Running on even ground 0  17. Running on uneven ground 0  18. Making sharp turns while running fast 0  19. Hopping  0  20. Rolling over in bed 3  Score total:  42/80     COGNITION: Overall cognitive status: Within functional limits for tasks assessed     SENSATION: Light touch: WFL Patient reports intermittent tingling in both legs, but none currently.   EDEMA:  No edema observed   PALPATION: No tenderness to palpation  JOINT MOBILITY:  Right hip: WFL and reproduced familiar pain  LOWER EXTREMITY ROM: WFL for activities assessed  LOWER EXTREMITY MMT:  MMT Right eval Left eval  Hip flexion 4-/5; :tender 4-/5; tender   Hip extension    Hip  abduction    Hip adduction 5/5 5/5  Hip internal rotation    Hip external rotation    Knee flexion 4+/5 4+/5  Knee extension 4-/5 4/5  Ankle dorsiflexion 4/5 4/5  Ankle plantarflexion    Ankle inversion    Ankle eversion     (Blank rows = not tested)  LOWER EXTREMITY SPECIAL TESTS:  Hip special tests: Hip scouring test: negative  FUNCTIONAL TESTS:  5 times sit to stand: 19.44 seconds 2 minute walk test: 378 feet Rolling from left sidelying to supine: reproduced familiar pain   GAIT: Distance walked: 378 feet Assistive device utilized: None Level of assistance: Complete Independence Comments: trendelenburg                                                                                                                                 TREATMENT DATE:                                     07/29/24 EXERCISE LOG  Exercise Repetitions and Resistance Comments  Nustep  L3 x 5 minutes  Utilizing BUE and BLE   Bridge   2 x 10 reps    Lower trunk rotation   20 reps    SLR   10 reps each    Double  knee to chest   25 reps  With LE supported on green ball   Static stance on foam  2.5 minutes  Intermittent UE support from parallel bars   Standing hip ABD  20 reps each  Utilizing BUE support from parallel bars   Standing gastroc stretch  3 x 30 seconds    Side stepping on foam   5 laps in parallel bars  With UE support from parallel bars   Lunges onto step   20 reps each  6 step    Blank cell = exercise not performed today   07/22/24: PT evaluation, patient education,, and HEP   PATIENT EDUCATION:  Education details: expectation for soreness, healing, and HEP  Person educated: Patient Education method: Explanation, Demonstration, and Handouts Education comprehension: verbalized understanding and returned demonstration  HOME EXERCISE PROGRAM: Access Code: [email or web address] URL: [email or web address] Date: 07/29/2024 Prepared by: Lacinda Fass  Exercises - Standing Hip Abduction with Counter Support  - 1 x daily - 7 x weekly - 2-3 sets - 10 reps - Lunge with Counter Support  - 1 x daily - 7 x weekly - 2-3 sets - 10 reps  Access Code: [email or web address] URL: [email or web address] Date: 07/22/2024 Prepared by: Lacinda Fass  Exercises - Supine Lower Trunk Rotation  - 1 x daily - 7 x weekly - 3 sets - 10 reps - Supine Bridge  - 1 x daily - 7 x weekly - 3 sets - 10 reps  ASSESSMENT:  CLINICAL IMPRESSION: Patient was introduced to multiple new interventions for improved lumbopelvic strength  and mobility. She required minimal cueing with standing hip abduction for upright posture to facilitate hip abductor engagement. She experienced no significant increase in pain or discomfort with any of today's interventions. She reported feeling sore upon the conclusion of treatment. Patient continues to require skilled physical therapy to address her remaining impairments to return to her prior level of function.    Eval: Patient is a 73 y.o. femal who was seen today for physical  therapy evaluation and treatment for chronic right hip pain. She presented with low pain severity and irritability with right hip passive range of motion and joint mobility testing reproducing her familiar symptoms. She also exhibited reduced hip flexor strength bilaterally. She is at an elevated risk of falling as evidenced by her five time sit to stand time. Recommend that she continue with skilled physical therapy to address her impairments to return to her prior level of function.   OBJECTIVE IMPAIRMENTS: Abnormal gait, decreased activity tolerance, decreased strength, and pain.   ACTIVITY LIMITATIONS: carrying, squatting, sleeping, stairs, transfers, and locomotion level  PARTICIPATION LIMITATIONS: cleaning, laundry, shopping, community activity, and yard work  PERSONAL FACTORS: Past/current experiences, Time since onset of injury/illness/exacerbation, and 3+ comorbidities: History of COVID-19, HTN, diabetes, osteoporosis, and congestive heart failure are also affecting patient's functional outcome.   REHAB POTENTIAL: Good  CLINICAL DECISION MAKING: Evolving/moderate complexity  EVALUATION COMPLEXITY: Moderate   GOALS: Goals reviewed with patient? Yes  LONG TERM GOALS: Target date: 08/19/24  Patient will be independent with her HEP. Baseline:  Goal status: INITIAL  2.  Patient will improve her five time sit to stand time to 12 seconds or less to reduce her fall risk.  Baseline:  Goal status: INITIAL  3.  Patient will report being able to complete her daily activities without her familiar pain exceeding 5/10. Baseline:  Goal status: INITIAL  4.  Patient will improve her two minute walk test distance by at least 40 feet for improved community mobility.  Baseline:  Goal status: INITIAL  5.  Patient will improve her LEFS score by at least 10 point for improved perceived function with her daily activities.  Baseline:  Goal status: INITIAL  PLAN:  PT FREQUENCY:  2x/week  PT DURATION: 4 [place]  PLANNED INTERVENTIONS: 02835- PT Re-evaluation, 97750- Physical Performance Testing, 97110-Therapeutic exercises, 97530- Therapeutic activity, 97112- Neuromuscular re-education, 97535- Self Care, 02859- Manual therapy, [phone]- Gait training, [phone]- Electrical stimulation (unattended), Patient/Family education, Balance training, Stair training, Joint mobilization, Cryotherapy, and Moist heat  PLAN FOR NEXT SESSION: review and update HEP (as needed), lower extremity strengthening, and educate on log rolling    Lacinda JAYSON Fass, PT 07/29/2024, 9:00 AM

## 2024-07-30 DIAGNOSIS — Z6832 Body mass index (BMI) 32.0-32.9, adult: Secondary | ICD-10-CM | POA: Diagnosis not present

## 2024-07-30 DIAGNOSIS — R809 Proteinuria, unspecified: Secondary | ICD-10-CM | POA: Diagnosis not present

## 2024-07-30 DIAGNOSIS — Z0000 Encounter for general adult medical examination without abnormal findings: Secondary | ICD-10-CM | POA: Diagnosis not present

## 2024-07-30 DIAGNOSIS — E785 Hyperlipidemia, unspecified: Secondary | ICD-10-CM | POA: Diagnosis not present

## 2024-07-30 DIAGNOSIS — M810 Age-related osteoporosis without current pathological fracture: Secondary | ICD-10-CM | POA: Diagnosis not present

## 2024-07-30 DIAGNOSIS — E870 Hyperosmolality and hypernatremia: Secondary | ICD-10-CM | POA: Diagnosis not present

## 2024-07-30 DIAGNOSIS — G4733 Obstructive sleep apnea (adult) (pediatric): Secondary | ICD-10-CM | POA: Diagnosis not present

## 2024-07-30 DIAGNOSIS — R2689 Other abnormalities of gait and mobility: Secondary | ICD-10-CM | POA: Diagnosis not present

## 2024-07-30 DIAGNOSIS — I509 Heart failure, unspecified: Secondary | ICD-10-CM | POA: Diagnosis not present

## 2024-08-06 ENCOUNTER — Ambulatory Visit (HOSPITAL_COMMUNITY)

## 2024-08-06 ENCOUNTER — Encounter (HOSPITAL_COMMUNITY): Payer: Self-pay

## 2024-08-06 DIAGNOSIS — M25551 Pain in right hip: Secondary | ICD-10-CM

## 2024-08-06 DIAGNOSIS — R29898 Other symptoms and signs involving the musculoskeletal system: Secondary | ICD-10-CM

## 2024-08-06 NOTE — Therapy (Signed)
 OUTPATIENT PHYSICAL THERAPY LOWER EXTREMITY TREATMENT   Patient Name: Kristina [name] MRN: [id number] DOB:05/03/1951, 73 y.o., female Today's Date: 08/06/2024  END OF SESSION:  PT End of Session - 08/06/24 0857     Visit Number 3    Number of Visits 8    Date for Recertification  09/19/24    Authorization Type [employer]    Authorization Time Period no auth required    PT Start Time 0853    PT Stop Time 0935    PT Time Calculation (min) 42 min    Activity Tolerance Patient tolerated treatment well    Behavior During Therapy [facility] for tasks assessed/performed           Past Medical History:  Diagnosis Date   Acute respiratory distress syndrome (ARDS) due to COVID-19 virus (HCC) 09/2019   Chronic combined systolic and diastolic CHF (congestive heart failure) (HCC)    Diabetes mellitus, type 2 (HCC)    Dyspnea    with exertion   GERD (gastroesophageal reflux disease)    Hyperlipidemia    Hypertension    Sleep apnea    Past Surgical History:  Procedure Laterality Date   COLONOSCOPY N/A 12/21/2016   Procedure: COLONOSCOPY;  Surgeon: Claudis RAYMOND Rivet, MD;  Location: [facility];  Service: Endoscopy;  Laterality: N/A;  930   KYPHOPLASTY N/A 11/13/2023   Procedure: - Thoracic eleven - Lumbar one kyphoplasty;  Surgeon: Debby Dorn MATSU, MD;  Location: [facility] OR;  Service: Neurosurgery;  Laterality: N/A;   ORIF ACETABULAR FRACTURE Left 03/16/2023   Procedure: OPEN REDUCTION INTERNAL FIXATION (ORIF) ACETABULAR FRACTURE STOPPA APPROACH;  Surgeon: Kendal Franky SQUIBB, MD;  Location: [facility] OR;  Service: Orthopedics;  Laterality: Left;   Patient Active Problem List   Diagnosis Date Noted   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 12/14/2023   Vaginal discharge 12/14/2023   Class 2 severe obesity due to excess calories with serious comorbidity and body mass index (BMI) of 38.0 to 38.9 in adult 10/25/2023   Acute exacerbation of congestive heart failure (HCC) 10/23/2023   Acute on chronic  respiratory failure with hypoxia (HCC) 10/23/2023   Unsteady gait when walking 09/05/2023   Low back pain 09/05/2023   Hypernatremia 09/05/2023   Other abnormalities of gait and mobility 09/05/2023   Chronic constipation 05/10/2023   Hemorrhoids 05/10/2023   Closed fracture of right acetabulum (HCC) 05/02/2023   Acetabular fracture (HCC) 03/14/2023   Candidiasis of skin 02/12/2023   Osteoporosis 01/22/2023   Proteinuria 06/15/2022   Sleep apnea 09/08/2021   Congestive heart failure (HCC) 07/29/2021   Hyperglycemia due to type 2 diabetes mellitus (HCC) 05/04/2021   Chronic systolic heart failure (HCC) 04/09/2021   Hypoxia    Acute hypoxemic respiratory failure (HCC) 04/05/2021   Dyspnea 04/05/2021   Tremor 04/05/2021   Impaired fasting glucose 01/25/2021   Gastroesophageal reflux disease 01/25/2021   Hyperlipidemia 01/25/2021   Morbid obesity (HCC) 01/25/2021   Prediabetes 01/25/2021   Tachycardia 01/25/2021   Pain of left hip joint 05/20/2020   Essential hypertension 10/08/2019   Uncontrolled diabetes mellitus 10/08/2019   UTI (urinary tract infection) 10/08/2019   Thrush 10/08/2019   Pneumonia due to COVID-19 virus 10/08/2019   Chronic respiratory failure with hypoxia (HCC) 10/02/2019   Acute respiratory distress syndrome (ARDS) due to COVID-19 virus (HCC) 10/02/2019   Pain in right shoulder 07/15/2018   Pain in right foot 07/15/2018   Special screening for malignant neoplasms, colon 11/06/2016    PCP: Shona Rush  Z, MD  REFERRING PROVIDER: Debby Dorn MATSU, MD   REFERRING DIAG: Pain in right hip   THERAPY DIAG:  Pain in right hip  Weakness of both hips  Rationale for Evaluation and Treatment: Rehabilitation  ONSET DATE: a few months   SUBJECTIVE:   SUBJECTIVE STATEMENT: Rt hip pain scale 3-4/10, back is feeling good today.  Exercises are going well at home.    Eval: Patient reports that her right hip have been bothering her for a few months now. Her  pain is primarily located on right lateral hip and it can go down her latera leg. She had never had any pain like this before. She has not fallen or lost her balance in the past six months. However, there are times that her pain will get bad enough that she can't sleep.   PERTINENT HISTORY: History of COVID-19, HTN, diabetes, osteoporosis, and congestive heart failure PAIN:  Are you having pain? Yes: NPRS scale: Current: 3-4/10 Best: 0/10 Worst: 7-8/10 Pain location: right lateral hip  Pain description: aching Aggravating factors: sitting (about 30 minutes), getting into and out of vehicles Relieving factors: medication  PRECAUTIONS: None  RED FLAGS: None   WEIGHT BEARING RESTRICTIONS: No  FALLS:  Has patient fallen in last 6 months? No  LIVING ENVIRONMENT: Lives with: lives with their family Lives in: House/apartment Stairs: Yes: External: 3 steps; can reach both; step to pattern with left foot leading Has following equipment at home: Single point cane, Walker - 2 wheeled, and bed side commode  OCCUPATION: retired   PLOF: Independent  PATIENT GOALS: reduced pain, be able to exercise at her senior citizens exercise class  NEXT MD VISIT: none scheduled  OBJECTIVE:  Note: Objective measures were completed at Evaluation unless otherwise noted.  DIAGNOSTIC FINDINGS: 04/23/24 right hip x-ray  IMPRESSION: 1. No acute fracture or dislocation of the right hip.  PATIENT SURVEYS:  LEFS  Extreme difficulty/unable (0), Quite a bit of difficulty (1), Moderate difficulty (2), Little difficulty (3), No difficulty (4) Survey date:  07/22/24  Any of your usual work, housework or school activities 4  2. Usual hobbies, recreational or sporting activities 3  3. Getting into/out of the bath 4  4. Walking between rooms 4  5. Putting on socks/shoes 4  6. Squatting  0  7. Lifting an object, like a bag of groceries from the floor 4  8. Performing light activities around your home 4  9.  Performing heavy activities around your home 0  10. Getting into/out of a car 3  11. Walking 2 blocks 3  12. Walking 1 mile 0  13. Going up/down 10 stairs (1 flight) 2  14. Standing for 1 hour 0  15.  sitting for 1 hour 4  16. Running on even ground 0  17. Running on uneven ground 0  18. Making sharp turns while running fast 0  19. Hopping  0  20. Rolling over in bed 3  Score total:  42/80     COGNITION: Overall cognitive status: Within functional limits for tasks assessed     SENSATION: Light touch: WFL Patient reports intermittent tingling in both legs, but none currently.   EDEMA:  No edema observed   PALPATION: No tenderness to palpation  JOINT MOBILITY:  Right hip: WFL and reproduced familiar pain  LOWER EXTREMITY ROM: WFL for activities assessed  LOWER EXTREMITY MMT:  MMT Right eval Left eval  Hip flexion 4-/5; :tender 4-/5; tender   Hip extension  Hip abduction    Hip adduction 5/5 5/5  Hip internal rotation    Hip external rotation    Knee flexion 4+/5 4+/5  Knee extension 4-/5 4/5  Ankle dorsiflexion 4/5 4/5  Ankle plantarflexion    Ankle inversion    Ankle eversion     (Blank rows = not tested)  LOWER EXTREMITY SPECIAL TESTS:  Hip special tests: Hip scouring test: negative  FUNCTIONAL TESTS:  5 times sit to stand: 19.44 seconds 2 minute walk test: 378 feet Rolling from left sidelying to supine: reproduced familiar pain   GAIT: Distance walked: 378 feet Assistive device utilized: None Level of assistance: Complete Independence Comments: trendelenburg                                                                                                                                 TREATMENT DATE:   08/06/24: Nustep L5 x 5 minutes UE/LE [place] Standing inside // bars:  Rockerboard R/L and PF/DF 1' each  Sit to stand eccentric lowering then power up 10x no UE support  Abduction with cueing to reduce ER with RTB resistance  around thigh  Sidestep with RTB around thigh 2RT  Monster walk (Diagonal movements with RTB around thigh) forward and retro 2RT  Lunges onto 6in step no UE support 2x 10  Tandem stance 1x 30 on floor, 2x 30 on foam with intermittent HHA  SLS Rt 8, Lt 3  Vector stance 3x 5                                    07/29/24 EXERCISE LOG  Exercise Repetitions and Resistance Comments  Nustep  L3 x 5 minutes  Utilizing BUE and BLE   Bridge   2 x 10 reps    Lower trunk rotation   20 reps    SLR   10 reps each    Double knee to chest   25 reps  With LE supported on green ball   Static stance on foam  2.5 minutes  Intermittent UE support from parallel bars   Standing hip ABD  20 reps each  Utilizing BUE support from parallel bars   Standing gastroc stretch  3 x 30 seconds    Side stepping on foam   5 laps in parallel bars  With UE support from parallel bars   Lunges onto step   20 reps each  6 step    Blank cell = exercise not performed today   07/22/24: PT evaluation, patient education,, and HEP   PATIENT EDUCATION:  Education details: expectation for soreness, healing, and HEP  Person educated: Patient Education method: Explanation, Demonstration, and Handouts Education comprehension: verbalized understanding and returned demonstration  HOME EXERCISE PROGRAM: Access Code: [email or web address] URL: [email or web address] Date: 07/29/2024 Prepared by: Lacinda Fass  Exercises - Standing Hip Abduction with Counter Support  -  1 x daily - 7 x weekly - 2-3 sets - 10 reps - Lunge with Counter Support  - 1 x daily - 7 x weekly - 2-3 sets - 10 reps  Access Code: [email or web address] URL: [email or web address] Date: 07/22/2024 Prepared by: Lacinda Fass  Exercises - Supine Lower Trunk Rotation  - 1 x daily - 7 x weekly - 3 sets - 10 reps - Supine Bridge  - 1 x daily - 7 x weekly - 3 sets - 10 reps  Access Code: [email or web address] URL: [email or web address] Date:  08/06/2024 Prepared by: Augustin Mclean  Exercises - Supine Lower Trunk Rotation  - 1 x daily - 7 x weekly - 3 sets - 10 reps - Supine Bridge  - 1 x daily - 7 x weekly - 3 sets - 10 reps - Standing Hip Abduction with Counter Support  - 1 x daily - 7 x weekly - 3 sets - 10 reps - Forward Lunge with Back Leg Straight and Counter Support  - 1 x daily - 7 x weekly - 3 sets - 10 reps - Single Leg Stance with Support  - 2 x daily - 7 x weekly - 1 sets - 5 reps - 30 hold - Standing Tandem Balance with Counter Support  - 2 x daily - 7 x weekly - 1 sets - 3 reps - 30 hold  ASSESSMENT:  CLINICAL IMPRESSION: Pt arrived with antalgic gait mechanics.  Added rockerboard and hip strengthening exercises to address this.  Able to add theraband resistance for strengthening with good control, cueing to reduce ER with abduction movements.  Added stability exercises to assist with balance with additional SLS and dynamic surface activities that required intermittent UE support for LOB.  Added balance activities to HEP, encouraged to complete near sink of counter for safety, verbalized understanding.  Pt reports of soreness at EOS.    Eval: Patient is a 73 y.o. femal who was seen today for physical therapy evaluation and treatment for chronic right hip pain. She presented with low pain severity and irritability with right hip passive range of motion and joint mobility testing reproducing her familiar symptoms. She also exhibited reduced hip flexor strength bilaterally. She is at an elevated risk of falling as evidenced by her five time sit to stand time. Recommend that she continue with skilled physical therapy to address her impairments to return to her prior level of function.   OBJECTIVE IMPAIRMENTS: Abnormal gait, decreased activity tolerance, decreased strength, and pain.   ACTIVITY LIMITATIONS: carrying, squatting, sleeping, stairs, transfers, and locomotion level  PARTICIPATION LIMITATIONS: cleaning, laundry,  shopping, community activity, and yard work  PERSONAL FACTORS: Past/current experiences, Time since onset of injury/illness/exacerbation, and 3+ comorbidities: History of COVID-19, HTN, diabetes, osteoporosis, and congestive heart failure are also affecting patient's functional outcome.   REHAB POTENTIAL: Good  CLINICAL DECISION MAKING: Evolving/moderate complexity  EVALUATION COMPLEXITY: Moderate   GOALS: Goals reviewed with patient? Yes  LONG TERM GOALS: Target date: 08/19/24  Patient will be independent with her HEP. Baseline:  Goal status: INITIAL  2.  Patient will improve her five time sit to stand time to 12 seconds or less to reduce her fall risk.  Baseline:  Goal status: INITIAL  3.  Patient will report being able to complete her daily activities without her familiar pain exceeding 5/10. Baseline:  Goal status: INITIAL  4.  Patient will improve her two minute walk test distance by at least 40 feet for improved community mobility.  Baseline:  Goal  status: INITIAL  5.  Patient will improve her LEFS score by at least 10 point for improved perceived function with her daily activities.  Baseline:  Goal status: INITIAL  PLAN:  PT FREQUENCY: 2x/week  PT DURATION: 4 [place]  PLANNED INTERVENTIONS: 97164- PT Re-evaluation, 97750- Physical Performance Testing, 97110-Therapeutic exercises, 97530- Therapeutic activity, 97112- Neuromuscular re-education, [phone]- Self Care, [id number]- Manual therapy, [phone]- Gait training, [phone]- Electrical stimulation (unattended), Patient/Family education, Balance training, Stair training, Joint mobilization, Cryotherapy, and Moist heat  PLAN FOR NEXT SESSION: review and update HEP (as needed), lower extremity strengthening, and educate on log rolling   Augustin Mclean, LPTA/CLT; CBIS [phone]  Mclean Augustin Amble, PTA 08/06/2024, 9:46 AM

## 2024-08-13 ENCOUNTER — Ambulatory Visit (HOSPITAL_COMMUNITY)

## 2024-08-13 ENCOUNTER — Encounter (HOSPITAL_COMMUNITY): Payer: Self-pay

## 2024-08-13 DIAGNOSIS — I5022 Chronic systolic (congestive) heart failure: Secondary | ICD-10-CM | POA: Diagnosis not present

## 2024-08-13 DIAGNOSIS — K5909 Other constipation: Secondary | ICD-10-CM | POA: Diagnosis not present

## 2024-08-13 DIAGNOSIS — M25551 Pain in right hip: Secondary | ICD-10-CM

## 2024-08-13 DIAGNOSIS — G4733 Obstructive sleep apnea (adult) (pediatric): Secondary | ICD-10-CM | POA: Diagnosis not present

## 2024-08-13 DIAGNOSIS — K219 Gastro-esophageal reflux disease without esophagitis: Secondary | ICD-10-CM | POA: Diagnosis not present

## 2024-08-13 DIAGNOSIS — I10 Essential (primary) hypertension: Secondary | ICD-10-CM | POA: Diagnosis not present

## 2024-08-13 DIAGNOSIS — E1169 Type 2 diabetes mellitus with other specified complication: Secondary | ICD-10-CM | POA: Diagnosis not present

## 2024-08-13 DIAGNOSIS — M810 Age-related osteoporosis without current pathological fracture: Secondary | ICD-10-CM | POA: Diagnosis not present

## 2024-08-13 DIAGNOSIS — Z9181 History of falling: Secondary | ICD-10-CM | POA: Diagnosis not present

## 2024-08-13 DIAGNOSIS — E662 Morbid (severe) obesity with alveolar hypoventilation: Secondary | ICD-10-CM | POA: Diagnosis not present

## 2024-08-13 DIAGNOSIS — R29898 Other symptoms and signs involving the musculoskeletal system: Secondary | ICD-10-CM | POA: Diagnosis not present

## 2024-08-13 DIAGNOSIS — E782 Mixed hyperlipidemia: Secondary | ICD-10-CM | POA: Diagnosis not present

## 2024-08-13 DIAGNOSIS — E669 Obesity, unspecified: Secondary | ICD-10-CM | POA: Diagnosis not present

## 2024-08-13 NOTE — Therapy (Signed)
 OUTPATIENT PHYSICAL THERAPY LOWER EXTREMITY TREATMENT   Patient Name: Kristina [name] MRN: [id number] DOB:December 28, 1950, 73 y.o., female Today's Date: 08/13/2024  END OF SESSION:  PT End of Session - 08/13/24 0821     Visit Number 4    Number of Visits 8    Date for Recertification  09/19/24    Authorization Type [employer]    Authorization Time Period no auth required    PT Start Time 0818    PT Stop Time 0858    PT Time Calculation (min) 40 min    Activity Tolerance Patient tolerated treatment well    Behavior During Therapy [facility] for tasks assessed/performed            Past Medical History:  Diagnosis Date   Acute respiratory distress syndrome (ARDS) due to COVID-19 virus (HCC) 09/2019   Chronic combined systolic and diastolic CHF (congestive heart failure) (HCC)    Diabetes mellitus, type 2 (HCC)    Dyspnea    with exertion   GERD (gastroesophageal reflux disease)    Hyperlipidemia    Hypertension    Sleep apnea    Past Surgical History:  Procedure Laterality Date   COLONOSCOPY N/A 12/21/2016   Procedure: COLONOSCOPY;  Surgeon: Claudis RAYMOND Rivet, MD;  Location: [facility]ITE;  Service: Endoscopy;  Laterality: N/A;  930   KYPHOPLASTY N/A 11/13/2023   Procedure: - Thoracic eleven - Lumbar one kyphoplasty;  Surgeon: Debby Dorn MATSU, MD;  Location: [facility] OR;  Service: Neurosurgery;  Laterality: N/A;   ORIF ACETABULAR FRACTURE Left 03/16/2023   Procedure: OPEN REDUCTION INTERNAL FIXATION (ORIF) ACETABULAR FRACTURE STOPPA APPROACH;  Surgeon: Kendal Franky SQUIBB, MD;  Location: [facility] OR;  Service: Orthopedics;  Laterality: Left;   Patient Active Problem List   Diagnosis Date Noted   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 12/14/2023   Vaginal discharge 12/14/2023   Class 2 severe obesity due to excess calories with serious comorbidity and body mass index (BMI) of 38.0 to 38.9 in adult 10/25/2023   Acute exacerbation of congestive heart failure (HCC) 10/23/2023   Acute on chronic  respiratory failure with hypoxia (HCC) 10/23/2023   Unsteady gait when walking 09/05/2023   Low back pain 09/05/2023   Hypernatremia 09/05/2023   Other abnormalities of gait and mobility 09/05/2023   Chronic constipation 05/10/2023   Hemorrhoids 05/10/2023   Closed fracture of right acetabulum (HCC) 05/02/2023   Acetabular fracture (HCC) 03/14/2023   Candidiasis of skin 02/12/2023   Osteoporosis 01/22/2023   Proteinuria 06/15/2022   Sleep apnea 09/08/2021   Congestive heart failure (HCC) 07/29/2021   Hyperglycemia due to type 2 diabetes mellitus (HCC) 05/04/2021   Chronic systolic heart failure (HCC) 04/09/2021   Hypoxia    Acute hypoxemic respiratory failure (HCC) 04/05/2021   Dyspnea 04/05/2021   Tremor 04/05/2021   Impaired fasting glucose 01/25/2021   Gastroesophageal reflux disease 01/25/2021   Hyperlipidemia 01/25/2021   Morbid obesity (HCC) 01/25/2021   Prediabetes 01/25/2021   Tachycardia 01/25/2021   Pain of left hip joint 05/20/2020   Essential hypertension 10/08/2019   Uncontrolled diabetes mellitus 10/08/2019   UTI (urinary tract infection) 10/08/2019   Thrush 10/08/2019   Pneumonia due to COVID-19 virus 10/08/2019   Chronic respiratory failure with hypoxia (HCC) 10/02/2019   Acute respiratory distress syndrome (ARDS) due to COVID-19 virus (HCC) 10/02/2019   Pain in right shoulder 07/15/2018   Pain in right foot 07/15/2018   Special screening for malignant neoplasms, colon 11/06/2016    PCP: Shona,  Norleen PEDLAR, MD  REFERRING PROVIDER: Debby Dorn MATSU, MD   REFERRING DIAG: Pain in right hip   THERAPY DIAG:  Pain in right hip  Weakness of both hips  Rationale for Evaluation and Treatment: Rehabilitation  ONSET DATE: a few months   SUBJECTIVE:   SUBJECTIVE STATEMENT: Reports hip was sore for 3 days following last session.  Current pain scale 5/10 Rt hip pain.  Eval: Patient reports that her right hip have been bothering her for a few months now. Her  pain is primarily located on right lateral hip and it can go down her latera leg. She had never had any pain like this before. She has not fallen or lost her balance in the past six months. However, there are times that her pain will get bad enough that she can't sleep.   PERTINENT HISTORY: History of COVID-19, HTN, diabetes, osteoporosis, and congestive heart failure PAIN:  Are you having pain? Yes: NPRS scale: Current: 10 Best: 0/10 Worst: 7-8/10 Pain location: right lateral hip  Pain description: aching Aggravating factors: sitting (about 30 minutes), getting into and out of vehicles Relieving factors: medication  PRECAUTIONS: None  RED FLAGS: None   WEIGHT BEARING RESTRICTIONS: No  FALLS:  Has patient fallen in last 6 months? No  LIVING ENVIRONMENT: Lives with: lives with their family Lives in: House/apartment Stairs: Yes: External: 3 steps; can reach both; step to pattern with left foot leading Has following equipment at home: Single point cane, Walker - 2 wheeled, and bed side commode  OCCUPATION: retired   PLOF: Independent  PATIENT GOALS: reduced pain, be able to exercise at her senior citizens exercise class  NEXT MD VISIT: none scheduled  OBJECTIVE:  Note: Objective measures were completed at Evaluation unless otherwise noted.  DIAGNOSTIC FINDINGS: 04/23/24 right hip x-ray  IMPRESSION: 1. No acute fracture or dislocation of the right hip.  PATIENT SURVEYS:  LEFS  Extreme difficulty/unable (0), Quite a bit of difficulty (1), Moderate difficulty (2), Little difficulty (3), No difficulty (4) Survey date:  07/22/24  Any of your usual work, housework or school activities 4  2. Usual hobbies, recreational or sporting activities 3  3. Getting into/out of the bath 4  4. Walking between rooms 4  5. Putting on socks/shoes 4  6. Squatting  0  7. Lifting an object, like a bag of groceries from the floor 4  8. Performing light activities around your home 4  9.  Performing heavy activities around your home 0  10. Getting into/out of a car 3  11. Walking 2 blocks 3  12. Walking 1 mile 0  13. Going up/down 10 stairs (1 flight) 2  14. Standing for 1 hour 0  15.  sitting for 1 hour 4  16. Running on even ground 0  17. Running on uneven ground 0  18. Making sharp turns while running fast 0  19. Hopping  0  20. Rolling over in bed 3  Score total:  42/80     COGNITION: Overall cognitive status: Within functional limits for tasks assessed     SENSATION: Light touch: WFL Patient reports intermittent tingling in both legs, but none currently.   EDEMA:  No edema observed   PALPATION: No tenderness to palpation  JOINT MOBILITY:  Right hip: WFL and reproduced familiar pain  LOWER EXTREMITY ROM: WFL for activities assessed  LOWER EXTREMITY MMT:  MMT Right eval Left eval  Hip flexion 4-/5; :tender 4-/5; tender   Hip extension  Hip abduction    Hip adduction 5/5 5/5  Hip internal rotation    Hip external rotation    Knee flexion 4+/5 4+/5  Knee extension 4-/5 4/5  Ankle dorsiflexion 4/5 4/5  Ankle plantarflexion    Ankle inversion    Ankle eversion     (Blank rows = not tested)  LOWER EXTREMITY SPECIAL TESTS:  Hip special tests: Hip scouring test: negative  FUNCTIONAL TESTS:  5 times sit to stand: 19.44 seconds 2 minute walk test: 378 feet Rolling from left sidelying to supine: reproduced familiar pain   GAIT: Distance walked: 378 feet Assistive device utilized: None Level of assistance: Complete Independence Comments: trendelenburg                                                                                                                                 TREATMENT DATE:   08/13/24: Nustep L3 x 5 minute UE/LE [place] trail Instructed log rolling Supine:  Bridge with RTB around thighs 2x 10  LTR 5x 10  SKTC 2x 30  Piriformis strech 90/90 2x 30 with towel Sidelying:  Clam with RTB 10x 5  holds Standing:  Heel raises 10x  Toe raises 10x   Abduction with cueing to reduce ER 10x  (increased pain Rt LE WB-ing)  08/06/24: Nustep L5 x 5 minutes UE/LE [place] Standing inside // bars:  Rockerboard R/L and PF/DF 1' each  Sit to stand eccentric lowering then power up 10x no UE support  Abduction with cueing to reduce ER with RTB resistance around thigh  Sidestep with RTB around thigh 2RT  Monster walk (Diagonal movements with RTB around thigh) forward and retro 2RT  Lunges onto 6in step no UE support 2x 10  Tandem stance 1x 30 on floor, 2x 30 on foam with intermittent HHA  SLS Rt 8, Lt 3  Vector stance 3x 5                                    07/29/24 EXERCISE LOG  Exercise Repetitions and Resistance Comments  Nustep  L3 x 5 minutes  Utilizing BUE and BLE   Bridge   2 x 10 reps    Lower trunk rotation   20 reps    SLR   10 reps each    Double knee to chest   25 reps  With LE supported on green ball   Static stance on foam  2.5 minutes  Intermittent UE support from parallel bars   Standing hip ABD  20 reps each  Utilizing BUE support from parallel bars   Standing gastroc stretch  3 x 30 seconds    Side stepping on foam   5 laps in parallel bars  With UE support from parallel bars   Lunges onto step   20 reps each  6 step  Blank cell = exercise not performed today   07/22/24: PT evaluation, patient education,, and HEP   PATIENT EDUCATION:  Education details: expectation for soreness, healing, and HEP  Person educated: Patient Education method: Explanation, Demonstration, and Handouts Education comprehension: verbalized understanding and returned demonstration  HOME EXERCISE PROGRAM: Access Code: [email or web address] URL: [email or web address] Date: 07/29/2024 Prepared by: Lacinda Fass  Exercises - Standing Hip Abduction with Counter Support  - 1 x daily - 7 x weekly - 2-3 sets - 10 reps - Lunge with Counter Support  - 1 x daily - 7 x  weekly - 2-3 sets - 10 reps  Access Code: [email or web address] URL: [email or web address] Date: 07/22/2024 Prepared by: Lacinda Fass  Exercises - Supine Lower Trunk Rotation  - 1 x daily - 7 x weekly - 3 sets - 10 reps - Supine Bridge  - 1 x daily - 7 x weekly - 3 sets - 10 reps  Access Code: [email or web address] URL: [email or web address] Date: 08/06/2024 Prepared by: Augustin Mclean  Exercises - Supine Lower Trunk Rotation  - 1 x daily - 7 x weekly - 3 sets - 10 reps - Supine Bridge  - 1 x daily - 7 x weekly - 3 sets - 10 reps - Standing Hip Abduction with Counter Support  - 1 x daily - 7 x weekly - 3 sets - 10 reps - Forward Lunge with Back Leg Straight and Counter Support  - 1 x daily - 7 x weekly - 3 sets - 10 reps - Single Leg Stance with Support  - 2 x daily - 7 x weekly - 1 sets - 5 reps - 30 hold - Standing Tandem Balance with Counter Support  - 2 x daily - 7 x weekly - 1 sets - 3 reps - 30 hold  08/13/24:  - Clam with Resistance  - 2 x daily - 7 x weekly - 2 sets - 10 reps - 5 hold - Seated Figure 4 Piriformis Stretch  - 2 x daily - 7 x weekly - 1 sets - 3 reps - 30 hold  ASSESSMENT:  CLINICAL IMPRESSION: Pt limited by pain this session, reduced intensity with exercises.  Continued session focus with gluteal strengthening and additional stretches for mobility.  Reviewed log rolling mechanics for pain control and discussed goals this session, pt in agreement with current POC.  Added clam for targeted gluteal strengthening and piriformis stretch.  Noted limited hip mobility with Rt piriformis stretch compared to Lt, added to HEP with printout given.  Pt limited by pain with weight bearing during standing abduction.  EOS reports of pain reduced to 2/10.  Eval: Patient is a 73 y.o. femal who was seen today for physical therapy evaluation and treatment for chronic right hip pain. She presented with low pain severity and irritability with right hip passive range of  motion and joint mobility testing reproducing her familiar symptoms. She also exhibited reduced hip flexor strength bilaterally. She is at an elevated risk of falling as evidenced by her five time sit to stand time. Recommend that she continue with skilled physical therapy to address her impairments to return to her prior level of function.   OBJECTIVE IMPAIRMENTS: Abnormal gait, decreased activity tolerance, decreased strength, and pain.   ACTIVITY LIMITATIONS: carrying, squatting, sleeping, stairs, transfers, and locomotion level  PARTICIPATION LIMITATIONS: cleaning, laundry, shopping, community activity, and yard work  PERSONAL FACTORS: Past/current experiences, Time since onset of injury/illness/exacerbation, and 3+ comorbidities: History of COVID-19, HTN, diabetes, osteoporosis, and congestive heart failure  are also affecting patient's functional outcome.   REHAB POTENTIAL: Good  CLINICAL DECISION MAKING: Evolving/moderate complexity  EVALUATION COMPLEXITY: Moderate   GOALS: Goals reviewed with patient? Yes  LONG TERM GOALS: Target date: 08/19/24  Patient will be independent with her HEP. Baseline:  Goal status: INITIAL  2.  Patient will improve her five time sit to stand time to 12 seconds or less to reduce her fall risk.  Baseline:  Goal status: INITIAL  3.  Patient will report being able to complete her daily activities without her familiar pain exceeding 5/10. Baseline:  Goal status: INITIAL  4.  Patient will improve her two minute walk test distance by at least 40 feet for improved community mobility.  Baseline:  Goal status: INITIAL  5.  Patient will improve her LEFS score by at least 10 point for improved perceived function with her daily activities.  Baseline:  Goal status: INITIAL  PLAN:  PT FREQUENCY: 2x/week  PT DURATION: 4 [place]  PLANNED INTERVENTIONS: 02835- PT Re-evaluation, 97750- Physical Performance Testing, 97110-Therapeutic exercises, 97530-  Therapeutic activity, [id number]- Neuromuscular re-education, 97535- Self Care, 02859- Manual therapy, [phone]- Gait training, [phone]- Electrical stimulation (unattended), Patient/Family education, Balance training, Stair training, Joint mobilization, Cryotherapy, and Moist heat  PLAN FOR NEXT SESSION: update HEP (as needed), lower extremity strengthening, hip mobility and balance.    Augustin Mclean, LPTA/CLT; CBIS [phone]  Mclean Augustin Amble, PTA 08/13/2024, 10:03 AM

## 2024-08-15 ENCOUNTER — Ambulatory Visit (HOSPITAL_COMMUNITY)

## 2024-08-18 ENCOUNTER — Ambulatory Visit (HOSPITAL_COMMUNITY)

## 2024-08-18 ENCOUNTER — Encounter (HOSPITAL_COMMUNITY): Payer: Self-pay

## 2024-08-18 DIAGNOSIS — R29898 Other symptoms and signs involving the musculoskeletal system: Secondary | ICD-10-CM

## 2024-08-18 DIAGNOSIS — M25551 Pain in right hip: Secondary | ICD-10-CM | POA: Diagnosis not present

## 2024-08-18 NOTE — Therapy (Signed)
 OUTPATIENT PHYSICAL THERAPY LOWER EXTREMITY TREATMENT   Patient Name: Kristina [name] MRN: [id number] DOB:12/15/1950, 73 y.o., female Today's Date: 08/18/2024  END OF SESSION:  PT End of Session - 08/18/24 0946     Visit Number 5    Number of Visits 8    Date for Recertification  09/19/24    Authorization Type [employer]    Authorization Time Period no auth required    PT Start Time [phone]    PT Stop Time 1027    PT Time Calculation (min) 40 min    Activity Tolerance Patient tolerated treatment well    Behavior During Therapy [facility] for tasks assessed/performed             Past Medical History:  Diagnosis Date   Acute respiratory distress syndrome (ARDS) due to COVID-19 virus (HCC) 09/2019   Chronic combined systolic and diastolic CHF (congestive heart failure) (HCC)    Diabetes mellitus, type 2 (HCC)    Dyspnea    with exertion   GERD (gastroesophageal reflux disease)    Hyperlipidemia    Hypertension    Sleep apnea    Past Surgical History:  Procedure Laterality Date   COLONOSCOPY N/A 12/21/2016   Procedure: COLONOSCOPY;  Surgeon: Claudis RAYMOND Rivet, MD;  Location: [facility];  Service: Endoscopy;  Laterality: N/A;  930   KYPHOPLASTY N/A 11/13/2023   Procedure: - Thoracic eleven - Lumbar one kyphoplasty;  Surgeon: Debby Dorn MATSU, MD;  Location: [facility] OR;  Service: Neurosurgery;  Laterality: N/A;   ORIF ACETABULAR FRACTURE Left 03/16/2023   Procedure: OPEN REDUCTION INTERNAL FIXATION (ORIF) ACETABULAR FRACTURE STOPPA APPROACH;  Surgeon: Kendal Franky SQUIBB, MD;  Location: [facility] OR;  Service: Orthopedics;  Laterality: Left;   Patient Active Problem List   Diagnosis Date Noted   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 12/14/2023   Vaginal discharge 12/14/2023   Class 2 severe obesity due to excess calories with serious comorbidity and body mass index (BMI) of 38.0 to 38.9 in adult 10/25/2023   Acute exacerbation of congestive heart failure (HCC) 10/23/2023   Acute on chronic  respiratory failure with hypoxia (HCC) 10/23/2023   Unsteady gait when walking 09/05/2023   Low back pain 09/05/2023   Hypernatremia 09/05/2023   Other abnormalities of gait and mobility 09/05/2023   Chronic constipation 05/10/2023   Hemorrhoids 05/10/2023   Closed fracture of right acetabulum (HCC) 05/02/2023   Acetabular fracture (HCC) 03/14/2023   Candidiasis of skin 02/12/2023   Osteoporosis 01/22/2023   Proteinuria 06/15/2022   Sleep apnea 09/08/2021   Congestive heart failure (HCC) 07/29/2021   Hyperglycemia due to type 2 diabetes mellitus (HCC) 05/04/2021   Chronic systolic heart failure (HCC) 04/09/2021   Hypoxia    Acute hypoxemic respiratory failure (HCC) 04/05/2021   Dyspnea 04/05/2021   Tremor 04/05/2021   Impaired fasting glucose 01/25/2021   Gastroesophageal reflux disease 01/25/2021   Hyperlipidemia 01/25/2021   Morbid obesity (HCC) 01/25/2021   Prediabetes 01/25/2021   Tachycardia 01/25/2021   Pain of left hip joint 05/20/2020   Essential hypertension 10/08/2019   Uncontrolled diabetes mellitus 10/08/2019   UTI (urinary tract infection) 10/08/2019   Thrush 10/08/2019   Pneumonia due to COVID-19 virus 10/08/2019   Chronic respiratory failure with hypoxia (HCC) 10/02/2019   Acute respiratory distress syndrome (ARDS) due to COVID-19 virus (HCC) 10/02/2019   Pain in right shoulder 07/15/2018   Pain in right foot 07/15/2018   Special screening for malignant neoplasms, colon 11/06/2016    PCP:  Shona Norleen PEDLAR, MD  REFERRING PROVIDER: Debby Dorn MATSU, MD   REFERRING DIAG: Pain in right hip   THERAPY DIAG:  Pain in right hip  Weakness of both hips  Rationale for Evaluation and Treatment: Rehabilitation  ONSET DATE: a few months   SUBJECTIVE:   SUBJECTIVE STATEMENT: Patient reports that she is hurting a little today. Her pain is still on and off with it being about 50/50 right now.   Eval: Patient reports that her right hip have been bothering her  for a few months now. Her pain is primarily located on right lateral hip and it can go down her latera leg. She had never had any pain like this before. She has not fallen or lost her balance in the past six months. However, there are times that her pain will get bad enough that she can't sleep.   PERTINENT HISTORY: History of COVID-19, HTN, diabetes, osteoporosis, and congestive heart failure PAIN:  Are you having pain? Yes: NPRS scale: Current: 4/10 Best: 0/10 Worst: 7-8/10 Pain location: right lateral hip  Pain description: aching Aggravating factors: sitting (about 30 minutes), getting into and out of vehicles Relieving factors: medication  PRECAUTIONS: None  RED FLAGS: None   WEIGHT BEARING RESTRICTIONS: No  FALLS:  Has patient fallen in last 6 months? No  LIVING ENVIRONMENT: Lives with: lives with their family Lives in: House/apartment Stairs: Yes: External: 3 steps; can reach both; step to pattern with left foot leading Has following equipment at home: Single point cane, Walker - 2 wheeled, and bed side commode  OCCUPATION: retired   PLOF: Independent  PATIENT GOALS: reduced pain, be able to exercise at her senior citizens exercise class  NEXT MD VISIT: none scheduled  OBJECTIVE:  Note: Objective measures were completed at Evaluation unless otherwise noted.  DIAGNOSTIC FINDINGS: 04/23/24 right hip x-ray  IMPRESSION: 1. No acute fracture or dislocation of the right hip.  PATIENT SURVEYS:  LEFS  Extreme difficulty/unable (0), Quite a bit of difficulty (1), Moderate difficulty (2), Little difficulty (3), No difficulty (4) Survey date:  07/22/24  Any of your usual work, housework or school activities 4  2. Usual hobbies, recreational or sporting activities 3  3. Getting into/out of the bath 4  4. Walking between rooms 4  5. Putting on socks/shoes 4  6. Squatting  0  7. Lifting an object, like a bag of groceries from the floor 4  8. Performing light activities  around your home 4  9. Performing heavy activities around your home 0  10. Getting into/out of a car 3  11. Walking 2 blocks 3  12. Walking 1 mile 0  13. Going up/down 10 stairs (1 flight) 2  14. Standing for 1 hour 0  15.  sitting for 1 hour 4  16. Running on even ground 0  17. Running on uneven ground 0  18. Making sharp turns while running fast 0  19. Hopping  0  20. Rolling over in bed 3  Score total:  42/80     COGNITION: Overall cognitive status: Within functional limits for tasks assessed     SENSATION: Light touch: WFL Patient reports intermittent tingling in both legs, but none currently.   EDEMA:  No edema observed   PALPATION: No tenderness to palpation  JOINT MOBILITY:  Right hip: WFL and reproduced familiar pain  LOWER EXTREMITY ROM: WFL for activities assessed  LOWER EXTREMITY MMT:  MMT Right eval Left eval  Hip flexion 4-/5; :tender 4-/5;  tender   Hip extension    Hip abduction    Hip adduction 5/5 5/5  Hip internal rotation    Hip external rotation    Knee flexion 4+/5 4+/5  Knee extension 4-/5 4/5  Ankle dorsiflexion 4/5 4/5  Ankle plantarflexion    Ankle inversion    Ankle eversion     (Blank rows = not tested)  LOWER EXTREMITY SPECIAL TESTS:  Hip special tests: Hip scouring test: negative  FUNCTIONAL TESTS:  5 times sit to stand: 19.44 seconds 2 minute walk test: 378 feet Rolling from left sidelying to supine: reproduced familiar pain   GAIT: Distance walked: 378 feet Assistive device utilized: None Level of assistance: Complete Independence Comments: trendelenburg                                                                                                                                 TREATMENT DATE:                                     08/18/24 EXERCISE LOG  Exercise Repetitions and Resistance Comments  Nustep  L4 x 6 minutes    Lateral step up  6 step x 15 reps each    Lunges onto step  6 step x 20 reps each   Frequent 1 HHA from railing   Standing hip extension  20 reps each   Standing heel raise  2 x 20 reps    Seated hip ADD isometric   2 minutes w/ 5 second hold    Seated HS curl  GTB x 20 reps each    Walking  1 lap around clinic  Trendelenburg gait pattern observed   Blank cell = exercise not performed today   08/13/24: Nustep L3 x 5 minute UE/LE [place] Instructed log rolling Supine:  Bridge with RTB around thighs 2x 10  LTR 5x 10  SKTC 2x 30  Piriformis strech 90/90 2x 30 with towel Sidelying:  Clam with RTB 10x 5 holds Standing:  Heel raises 10x  Toe raises 10x   Abduction with cueing to reduce ER 10x  (increased pain Rt LE WB-ing)  08/06/24: Nustep L5 x 5 minutes UE/LE [place] Standing inside // bars:  Rockerboard R/L and PF/DF 1' each  Sit to stand eccentric lowering then power up 10x no UE support  Abduction with cueing to reduce ER with RTB resistance around thigh  Sidestep with RTB around thigh 2RT  Monster walk (Diagonal movements with RTB around thigh) forward and retro 2RT  Lunges onto 6in step no UE support 2x 10  Tandem stance 1x 30 on floor, 2x 30 on foam with intermittent HHA  SLS Rt 8, Lt 3  Vector stance 3x 5  07/29/24 EXERCISE LOG  Exercise Repetitions and Resistance Comments  Nustep  L3 x 5 minutes  Utilizing BUE and BLE   Bridge   2 x 10 reps    Lower trunk rotation   20 reps    SLR   10 reps each    Double knee to chest   25 reps  With LE supported on green ball   Static stance on foam  2.5 minutes  Intermittent UE support from parallel bars   Standing hip ABD  20 reps each  Utilizing BUE support from parallel bars   Standing gastroc stretch  3 x 30 seconds    Side stepping on foam   5 laps in parallel bars  With UE support from parallel bars   Lunges onto step   20 reps each  6 step    Blank cell = exercise not performed today   07/22/24: PT evaluation, patient education,, and HEP    PATIENT EDUCATION:  Education details: expectation for soreness, healing, and HEP  Person educated: Patient Education method: Explanation, Demonstration, and Handouts Education comprehension: verbalized understanding and returned demonstration  HOME EXERCISE PROGRAM: Access Code: [email or web address] URL: [email or web address] Date: 07/29/2024 Prepared by: Lacinda Fass  Exercises - Standing Hip Abduction with Counter Support  - 1 x daily - 7 x weekly - 2-3 sets - 10 reps - Lunge with Counter Support  - 1 x daily - 7 x weekly - 2-3 sets - 10 reps  Access Code: [email or web address] URL: [email or web address] Date: 07/22/2024 Prepared by: Lacinda Fass  Exercises - Supine Lower Trunk Rotation  - 1 x daily - 7 x weekly - 3 sets - 10 reps - Supine Bridge  - 1 x daily - 7 x weekly - 3 sets - 10 reps  Access Code: [email or web address] URL: [email or web address] Date: 08/06/2024 Prepared by: Augustin Mclean  Exercises - Supine Lower Trunk Rotation  - 1 x daily - 7 x weekly - 3 sets - 10 reps - Supine Bridge  - 1 x daily - 7 x weekly - 3 sets - 10 reps - Standing Hip Abduction with Counter Support  - 1 x daily - 7 x weekly - 3 sets - 10 reps - Forward Lunge with Back Leg Straight and Counter Support  - 1 x daily - 7 x weekly - 3 sets - 10 reps - Single Leg Stance with Support  - 2 x daily - 7 x weekly - 1 sets - 5 reps - 30 hold - Standing Tandem Balance with Counter Support  - 2 x daily - 7 x weekly - 1 sets - 3 reps - 30 hold  08/13/24:  - Clam with Resistance  - 2 x daily - 7 x weekly - 2 sets - 10 reps - 5 hold - Seated Figure 4 Piriformis Stretch  - 2 x daily - 7 x weekly - 1 sets - 3 reps - 30 hold  ASSESSMENT:  CLINICAL IMPRESSION: Patient was progressed with new and familiar interventions for improved lower extremity muscular strength and stability. She required minimal cueing with today's new interventions for proper biomechanics to avoid compensatory  movement patterns. She experienced no increase in pain or discomfort with any of today's interventions. She reported that her hip felt a little better upon the conclusion of treatment. Patient continues to require skilled physical therapy to address her remaining impairments to return to her prior level of function.    Eval: Patient is a 73 y.o. femal who was seen today  for physical therapy evaluation and treatment for chronic right hip pain. She presented with low pain severity and irritability with right hip passive range of motion and joint mobility testing reproducing her familiar symptoms. She also exhibited reduced hip flexor strength bilaterally. She is at an elevated risk of falling as evidenced by her five time sit to stand time. Recommend that she continue with skilled physical therapy to address her impairments to return to her prior level of function.   OBJECTIVE IMPAIRMENTS: Abnormal gait, decreased activity tolerance, decreased strength, and pain.   ACTIVITY LIMITATIONS: carrying, squatting, sleeping, stairs, transfers, and locomotion level  PARTICIPATION LIMITATIONS: cleaning, laundry, shopping, community activity, and yard work  PERSONAL FACTORS: Past/current experiences, Time since onset of injury/illness/exacerbation, and 3+ comorbidities: History of COVID-19, HTN, diabetes, osteoporosis, and congestive heart failure are also affecting patient's functional outcome.   REHAB POTENTIAL: Good  CLINICAL DECISION MAKING: Evolving/moderate complexity  EVALUATION COMPLEXITY: Moderate   GOALS: Goals reviewed with patient? Yes  LONG TERM GOALS: Target date: 08/19/24  Patient will be independent with her HEP. Baseline:  Goal status: INITIAL  2.  Patient will improve her five time sit to stand time to 12 seconds or less to reduce her fall risk.  Baseline:  Goal status: INITIAL  3.  Patient will report being able to complete her daily activities without her familiar pain  exceeding 5/10. Baseline:  Goal status: INITIAL  4.  Patient will improve her two minute walk test distance by at least 40 feet for improved community mobility.  Baseline:  Goal status: INITIAL  5.  Patient will improve her LEFS score by at least 10 point for improved perceived function with her daily activities.  Baseline:  Goal status: INITIAL  PLAN:  PT FREQUENCY: 2x/week  PT DURATION: 4 [place]  PLANNED INTERVENTIONS: 02835- PT Re-evaluation, 97750- Physical Performance Testing, 97110-Therapeutic exercises, 97530- Therapeutic activity, [id number]- Neuromuscular re-education, 97535- Self Care, [id number]- Manual therapy, [phone]- Gait training, [phone]- Electrical stimulation (unattended), Patient/Family education, Balance training, Stair training, Joint mobilization, Cryotherapy, and Moist heat  PLAN FOR NEXT SESSION: update HEP (as needed), lower extremity strengthening, hip mobility and balance.    Lacinda JAYSON Fass, PT 08/18/2024, 11:20 AM

## 2024-08-20 ENCOUNTER — Ambulatory Visit (HOSPITAL_COMMUNITY)

## 2024-08-23 DIAGNOSIS — J9611 Chronic respiratory failure with hypoxia: Secondary | ICD-10-CM | POA: Diagnosis not present

## 2024-08-23 DIAGNOSIS — R0902 Hypoxemia: Secondary | ICD-10-CM | POA: Diagnosis not present

## 2024-08-24 DIAGNOSIS — I10 Essential (primary) hypertension: Secondary | ICD-10-CM | POA: Diagnosis not present

## 2024-08-24 DIAGNOSIS — E782 Mixed hyperlipidemia: Secondary | ICD-10-CM | POA: Diagnosis not present

## 2024-08-24 DIAGNOSIS — E1165 Type 2 diabetes mellitus with hyperglycemia: Secondary | ICD-10-CM | POA: Diagnosis not present

## 2024-08-24 DIAGNOSIS — I5022 Chronic systolic (congestive) heart failure: Secondary | ICD-10-CM | POA: Diagnosis not present

## 2024-08-25 ENCOUNTER — Ambulatory Visit (HOSPITAL_COMMUNITY)

## 2024-08-27 ENCOUNTER — Ambulatory Visit (HOSPITAL_COMMUNITY): Admitting: Physical Therapy

## 2024-08-27 DIAGNOSIS — M5459 Other low back pain: Secondary | ICD-10-CM | POA: Diagnosis present

## 2024-08-27 DIAGNOSIS — M25551 Pain in right hip: Secondary | ICD-10-CM | POA: Insufficient documentation

## 2024-08-27 DIAGNOSIS — R29898 Other symptoms and signs involving the musculoskeletal system: Secondary | ICD-10-CM | POA: Insufficient documentation

## 2024-08-27 NOTE — Therapy (Signed)
 OUTPATIENT PHYSICAL THERAPY LOWER EXTREMITY TREATMENT   Patient Name: Kristina [name] MRN: [id number] DOB:January 08, 1951, 73 y.o., female Today's Date: 08/27/2024  END OF SESSION:  PT End of Session - 08/27/24 1159     Visit Number 6    Number of Visits 8    Date for Recertification  09/19/24    Authorization Type [employer]    Authorization Time Period no auth required    PT Start Time 1118    PT Stop Time 1207    PT Time Calculation (min) 49 min    Activity Tolerance Patient tolerated treatment well    Behavior During Therapy WFL for tasks assessed/performed              Past Medical History:  Diagnosis Date   Acute respiratory distress syndrome (ARDS) due to COVID-19 virus (HCC) 09/2019   Chronic combined systolic and diastolic CHF (congestive heart failure) (HCC)    Diabetes mellitus, type 2 (HCC)    Dyspnea    with exertion   GERD (gastroesophageal reflux disease)    Hyperlipidemia    Hypertension    Sleep apnea    Past Surgical History:  Procedure Laterality Date   COLONOSCOPY N/A 12/21/2016   Procedure: COLONOSCOPY;  Surgeon: Claudis RAYMOND Rivet, MD;  Location: [facility];  Service: Endoscopy;  Laterality: N/A;  930   KYPHOPLASTY N/A 11/13/2023   Procedure: - Thoracic eleven - Lumbar one kyphoplasty;  Surgeon: Debby Dorn MATSU, MD;  Location: [facility] OR;  Service: Neurosurgery;  Laterality: N/A;   ORIF ACETABULAR FRACTURE Left 03/16/2023   Procedure: OPEN REDUCTION INTERNAL FIXATION (ORIF) ACETABULAR FRACTURE STOPPA APPROACH;  Surgeon: Kendal Franky SQUIBB, MD;  Location: [facility] OR;  Service: Orthopedics;  Laterality: Left;   Patient Active Problem List   Diagnosis Date Noted   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 12/14/2023   Vaginal discharge 12/14/2023   Class 2 severe obesity due to excess calories with serious comorbidity and body mass index (BMI) of 38.0 to 38.9 in adult 10/25/2023   Acute exacerbation of congestive heart failure (HCC) 10/23/2023   Acute on chronic  respiratory failure with hypoxia (HCC) 10/23/2023   Unsteady gait when walking 09/05/2023   Low back pain 09/05/2023   Hypernatremia 09/05/2023   Other abnormalities of gait and mobility 09/05/2023   Chronic constipation 05/10/2023   Hemorrhoids 05/10/2023   Closed fracture of right acetabulum (HCC) 05/02/2023   Acetabular fracture (HCC) 03/14/2023   Candidiasis of skin 02/12/2023   Osteoporosis 01/22/2023   Proteinuria 06/15/2022   Sleep apnea 09/08/2021   Congestive heart failure (HCC) 07/29/2021   Hyperglycemia due to type 2 diabetes mellitus (HCC) 05/04/2021   Chronic systolic heart failure (HCC) 04/09/2021   Hypoxia    Acute hypoxemic respiratory failure (HCC) 04/05/2021   Dyspnea 04/05/2021   Tremor 04/05/2021   Impaired fasting glucose 01/25/2021   Gastroesophageal reflux disease 01/25/2021   Hyperlipidemia 01/25/2021   Morbid obesity (HCC) 01/25/2021   Prediabetes 01/25/2021   Tachycardia 01/25/2021   Pain of left hip joint 05/20/2020   Essential hypertension 10/08/2019   Uncontrolled diabetes mellitus 10/08/2019   UTI (urinary tract infection) 10/08/2019   Thrush 10/08/2019   Pneumonia due to COVID-19 virus 10/08/2019   Chronic respiratory failure with hypoxia (HCC) 10/02/2019   Acute respiratory distress syndrome (ARDS) due to COVID-19 virus (HCC) 10/02/2019   Pain in right shoulder 07/15/2018   Pain in right foot 07/15/2018   Special screening for malignant neoplasms, colon 11/06/2016  PCP: Shona Norleen PEDLAR, MD  REFERRING PROVIDER: Debby Dorn MATSU, MD   REFERRING DIAG: Pain in right hip   THERAPY DIAG:  Pain in right hip  Weakness of both hips  Other low back pain  Weakness of lower extremity, unspecified laterality  Rationale for Evaluation and Treatment: Rehabilitation  ONSET DATE: a few months   SUBJECTIVE:   SUBJECTIVE STATEMENT: Patient reports that she is doing most everything without issues.  Expressed wish to make today her last day  toward the end of session.  No pain currently.    Eval: Patient reports that her right hip have been bothering her for a few months now. Her pain is primarily located on right lateral hip and it can go down her lateral leg. She had never had any pain like this before. She has not fallen or lost her balance in the past six months. However, there are times that her pain will get bad enough that she can't sleep.   PERTINENT HISTORY: History of COVID-19, HTN, diabetes, osteoporosis, and congestive heart failure PAIN:  Are you having pain? No  PRECAUTIONS: None  RED FLAGS: None   WEIGHT BEARING RESTRICTIONS: No  FALLS:  Has patient fallen in last 6 months? No  LIVING ENVIRONMENT: Lives with: lives with their family Lives in: House/apartment Stairs: Yes: External: 3 steps; can reach both; step to pattern with left foot leading Has following equipment at home: Single point cane, Walker - 2 wheeled, and bed side commode  OCCUPATION: retired   PLOF: Independent  PATIENT GOALS: reduced pain, be able to exercise at her senior citizens exercise class  NEXT MD VISIT: none scheduled  OBJECTIVE:  Note: Objective measures were completed at Evaluation unless otherwise noted.  DIAGNOSTIC FINDINGS: 04/23/24 right hip x-ray  IMPRESSION: 1. No acute fracture or dislocation of the right hip.  PATIENT SURVEYS:  LEFS  Extreme difficulty/unable (0), Quite a bit of difficulty (1), Moderate difficulty (2), Little difficulty (3), No difficulty (4) Survey date:  07/22/24  Any of your usual work, housework or school activities 4  2. Usual hobbies, recreational or sporting activities 3  3. Getting into/out of the bath 4  4. Walking between rooms 4  5. Putting on socks/shoes 4  6. Squatting  0  7. Lifting an object, like a bag of groceries from the floor 4  8. Performing light activities around your home 4  9. Performing heavy activities around your home 0  10. Getting into/out of a car 3  11.  Walking 2 blocks 3  12. Walking 1 mile 0  13. Going up/down 10 stairs (1 flight) 2  14. Standing for 1 hour 0  15.  sitting for 1 hour 4  16. Running on even ground 0  17. Running on uneven ground 0  18. Making sharp turns while running fast 0  19. Hopping  0  20. Rolling over in bed 3  Score total:  42/80   08/27/24 LEFS: 56 / 80 = 70.0 %   COGNITION: Overall cognitive status: Within functional limits for tasks assessed     SENSATION: Light touch: WFL Patient reports intermittent tingling in both legs, but none currently.   EDEMA:  No edema observed   PALPATION: No tenderness to palpation  JOINT MOBILITY:  Right hip: WFL and reproduced familiar pain  LOWER EXTREMITY ROM: WFL for activities assessed  LOWER EXTREMITY MMT:  MMT Right eval Left eval  Hip flexion 4-/5; :tender 4-/5; tender   Hip extension  Hip abduction    Hip adduction 5/5 5/5  Hip internal rotation    Hip external rotation    Knee flexion 4+/5 4+/5  Knee extension 4-/5 4/5  Ankle dorsiflexion 4/5 4/5  Ankle plantarflexion    Ankle inversion    Ankle eversion     (Blank rows = not tested)  LOWER EXTREMITY SPECIAL TESTS:  Hip special tests: Hip scouring test: negative  FUNCTIONAL TESTS:  Evaluation:  5 times sit to stand: 19.44 seconds 2 minute walk test: 378 feet Rolling from left sidelying to supine: reproduced familiar pain   08/27/24 5X STS:  7.86 no UE (was 19.44) 2 MWT 418 feet  GAIT: Distance walked: 378 feet Assistive device utilized: None Level of assistance: Complete Independence Comments: trendelenburg                                                                                                                                 TREATMENT DATE:   08/27/24 Nustep seat 4, level 4 X 6 minutes UE/LE Standing:  heelraises on incline 20X  6 forward step ups  6 lateral step downs  Lunges onto 4 step no UE assist  Hip abduction Functional test measures:   LEFS:  56  / 80 = 70.0 % (was 42/80) 5X STS:  7.86 no UE (was 19.44) 2 MWT 418 feet Goal review; discharge instructions                                    08/18/24 EXERCISE LOG  Exercise Repetitions and Resistance Comments  Nustep  L4 x 6 minutes    Lateral step up  6 step x 15 reps each    Lunges onto step  6 step x 20 reps each  Frequent 1 HHA from railing   Standing hip extension  20 reps each   Standing heel raise  2 x 20 reps    Seated hip ADD isometric   2 minutes w/ 5 second hold    Seated HS curl  GTB x 20 reps each    Walking  1 lap around clinic  Trendelenburg gait pattern observed   Blank cell = exercise not performed today   08/13/24: Nustep L3 x 5 minute UE/LE [place] trail Instructed log rolling Supine:  Bridge with RTB around thighs 2x 10  LTR 5x 10  SKTC 2x 30  Piriformis strech 90/90 2x 30 with towel Sidelying:  Clam with RTB 10x 5 holds Standing:  Heel raises 10x  Toe raises 10x   Abduction with cueing to reduce ER 10x  (increased pain Rt LE WB-ing)  08/06/24: Nustep L5 x 5 minutes UE/LE [place] Standing inside // bars:  Rockerboard R/L and PF/DF 1' each  Sit to stand eccentric lowering then power up 10x no UE support  Abduction with cueing to reduce ER with RTB resistance  around thigh  Sidestep with RTB around thigh 2RT  Monster walk (Diagonal movements with RTB around thigh) forward and retro 2RT  Lunges onto 6in step no UE support 2x 10  Tandem stance 1x 30 on floor, 2x 30 on foam with intermittent HHA  SLS Rt 8, Lt 3  Vector stance 3x 5                                07/22/24: PT evaluation, patient education,, and HEP   PATIENT EDUCATION:  Education details: expectation for soreness, healing, and HEP  Person educated: Patient Education method: Explanation, Demonstration, and Handouts Education comprehension: verbalized understanding and returned demonstration  HOME EXERCISE PROGRAM: Access Code: [email or web address] URL:  [email or web address] Date: 07/29/2024 Prepared by: Lacinda Fass  Exercises - Standing Hip Abduction with Counter Support  - 1 x daily - 7 x weekly - 2-3 sets - 10 reps - Lunge with Counter Support  - 1 x daily - 7 x weekly - 2-3 sets - 10 reps  Access Code: [email or web address] URL: [email or web address] Date: 07/22/2024 Prepared by: Lacinda Fass  Exercises - Supine Lower Trunk Rotation  - 1 x daily - 7 x weekly - 3 sets - 10 reps - Supine Bridge  - 1 x daily - 7 x weekly - 3 sets - 10 reps  Access Code: [email or web address] URL: [email or web address] Date: 08/06/2024 Prepared by: Augustin Mclean  Exercises - Supine Lower Trunk Rotation  - 1 x daily - 7 x weekly - 3 sets - 10 reps - Supine Bridge  - 1 x daily - 7 x weekly - 3 sets - 10 reps - Standing Hip Abduction with Counter Support  - 1 x daily - 7 x weekly - 3 sets - 10 reps - Forward Lunge with Back Leg Straight and Counter Support  - 1 x daily - 7 x weekly - 3 sets - 10 reps - Single Leg Stance with Support  - 2 x daily - 7 x weekly - 1 sets - 5 reps - 30 hold - Standing Tandem Balance with Counter Support  - 2 x daily - 7 x weekly - 1 sets - 3 reps - 30 hold  08/13/24:  - Clam with Resistance  - 2 x daily - 7 x weekly - 2 sets - 10 reps - 5 hold - Seated Figure 4 Piriformis Stretch  - 2 x daily - 7 x weekly - 1 sets - 3 reps - 30 hold  ASSESSMENT:  CLINICAL IMPRESSION: Continued with focus on improving LE strength/stabilization.   Added hip hikes with noted challenge/gluteal fatigue.  Pt completed entire session without rest breaks or complaints.  Expressed at end of session she would like today to be her last day.  Functional test measures completed with pt meeting all long term goals.  Pt has copies of all current exercises.  No further skilled need required and is ready for discharge.     PHYSICAL THERAPY DISCHARGE SUMMARY  Visits from Start of Care: 6  Current functional level related to  goals / functional outcomes: Patient was able to meet all of her goals for skilled physical therapy.    Remaining deficits: None    Education / Equipment: HEP    Patient agrees to discharge. Patient goals were met. Patient is being discharged due to meeting the stated rehab goals.  Lacinda Fass, PT, DPT    Eval: Patient is a 73 y.o. femal  who was seen today for physical therapy evaluation and treatment for chronic right hip pain. She presented with low pain severity and irritability with right hip passive range of motion and joint mobility testing reproducing her familiar symptoms. She also exhibited reduced hip flexor strength bilaterally. She is at an elevated risk of falling as evidenced by her five time sit to stand time. Recommend that she continue with skilled physical therapy to address her impairments to return to her prior level of function.   OBJECTIVE IMPAIRMENTS: Abnormal gait, decreased activity tolerance, decreased strength, and pain.   ACTIVITY LIMITATIONS: carrying, squatting, sleeping, stairs, transfers, and locomotion level  PARTICIPATION LIMITATIONS: cleaning, laundry, shopping, community activity, and yard work  PERSONAL FACTORS: Past/current experiences, Time since onset of injury/illness/exacerbation, and 3+ comorbidities: History of COVID-19, HTN, diabetes, osteoporosis, and congestive heart failure are also affecting patient's functional outcome.   REHAB POTENTIAL: Good  CLINICAL DECISION MAKING: Evolving/moderate complexity  EVALUATION COMPLEXITY: Moderate   GOALS: Goals reviewed with patient? Yes  LONG TERM GOALS: Target date: 08/19/24  Patient will be independent with her HEP. Baseline:  Goal status: MET  2.  Patient will improve her five time sit to stand time to 12 seconds or less to reduce her fall risk.  Baseline:  Goal status: MET  3.  Patient will report being able to complete her daily activities without her familiar pain exceeding  5/10. Baseline:  Goal status: MET  4.  Patient will improve her two minute walk test distance by at least 40 feet for improved community mobility.  Baseline:  Goal status: MET  5.  Patient will improve her LEFS score by at least 10 point for improved perceived function with her daily activities.  Baseline:  Goal status: MET  PLAN:  PT FREQUENCY: 2x/week  PT DURATION: 4 [place]  PLANNED INTERVENTIONS: 02835- PT Re-evaluation, 97750- Physical Performance Testing, 97110-Therapeutic exercises, 97530- Therapeutic activity, [id number]- Neuromuscular re-education, 97535- Self Care, 02859- Manual therapy, [phone]- Gait training, [phone]- Electrical stimulation (unattended), Patient/Family education, Balance training, Stair training, Joint mobilization, Cryotherapy, and Moist heat  PLAN FOR NEXT SESSION: Discharge as all goals met.    Vivian No B, PTA 08/27/2024, 12:34 PM

## 2024-08-29 ENCOUNTER — Other Ambulatory Visit: Payer: Self-pay | Admitting: Cardiology

## 2024-09-10 DIAGNOSIS — K5909 Other constipation: Secondary | ICD-10-CM | POA: Diagnosis not present

## 2024-09-10 DIAGNOSIS — M810 Age-related osteoporosis without current pathological fracture: Secondary | ICD-10-CM | POA: Diagnosis not present

## 2024-09-10 DIAGNOSIS — E669 Obesity, unspecified: Secondary | ICD-10-CM | POA: Diagnosis not present

## 2024-09-10 DIAGNOSIS — M25551 Pain in right hip: Secondary | ICD-10-CM | POA: Diagnosis not present

## 2024-09-10 DIAGNOSIS — E782 Mixed hyperlipidemia: Secondary | ICD-10-CM | POA: Diagnosis not present

## 2024-09-10 DIAGNOSIS — Z9181 History of falling: Secondary | ICD-10-CM | POA: Diagnosis not present

## 2024-09-10 DIAGNOSIS — E1169 Type 2 diabetes mellitus with other specified complication: Secondary | ICD-10-CM | POA: Diagnosis not present

## 2024-09-10 DIAGNOSIS — E662 Morbid (severe) obesity with alveolar hypoventilation: Secondary | ICD-10-CM | POA: Diagnosis not present

## 2024-09-10 DIAGNOSIS — G4733 Obstructive sleep apnea (adult) (pediatric): Secondary | ICD-10-CM | POA: Diagnosis not present

## 2024-09-10 DIAGNOSIS — K219 Gastro-esophageal reflux disease without esophagitis: Secondary | ICD-10-CM | POA: Diagnosis not present

## 2024-09-10 DIAGNOSIS — I10 Essential (primary) hypertension: Secondary | ICD-10-CM | POA: Diagnosis not present

## 2024-09-10 DIAGNOSIS — I5022 Chronic systolic (congestive) heart failure: Secondary | ICD-10-CM | POA: Diagnosis not present

## 2024-09-21 ENCOUNTER — Other Ambulatory Visit: Payer: Self-pay | Admitting: Cardiology

## 2024-09-22 DIAGNOSIS — J9611 Chronic respiratory failure with hypoxia: Secondary | ICD-10-CM | POA: Diagnosis not present

## 2024-09-22 DIAGNOSIS — R0902 Hypoxemia: Secondary | ICD-10-CM | POA: Diagnosis not present

## 2024-10-14 ENCOUNTER — Encounter (HOSPITAL_BASED_OUTPATIENT_CLINIC_OR_DEPARTMENT_OTHER): Payer: Self-pay | Admitting: Pulmonary Disease

## 2024-10-14 ENCOUNTER — Other Ambulatory Visit: Payer: Self-pay | Admitting: Cardiology

## 2024-10-28 ENCOUNTER — Encounter: Payer: Self-pay | Admitting: Student

## 2024-10-28 ENCOUNTER — Ambulatory Visit: Admitting: Student

## 2024-10-28 VITALS — BP 112/70 | HR 93 | Ht <= 58 in | Wt 148.6 lb

## 2024-10-28 DIAGNOSIS — I10 Essential (primary) hypertension: Secondary | ICD-10-CM

## 2024-10-28 DIAGNOSIS — I5032 Chronic diastolic (congestive) heart failure: Secondary | ICD-10-CM | POA: Diagnosis not present

## 2024-10-28 DIAGNOSIS — E782 Mixed hyperlipidemia: Secondary | ICD-10-CM

## 2024-10-28 MED ORDER — MOUNJARO 12.5 MG/0.5ML ~~LOC~~ SOAJ: 12.5000 mg | SUBCUTANEOUS | Status: AC

## 2024-10-28 MED ORDER — EMPAGLIFLOZIN 10 MG PO TABS: 10.0000 mg | ORAL_TABLET | Freq: Every day | ORAL | 3 refills | Status: AC

## 2024-10-28 MED ORDER — CARVEDILOL 6.25 MG PO TABS: 6.2500 mg | ORAL_TABLET | Freq: Two times a day (BID) | ORAL | 3 refills | Status: AC

## 2024-10-28 NOTE — Patient Instructions (Signed)
 Medication Instructions:  Your physician recommends that you continue on your current medications as directed. Please refer to the Current Medication list given to you today.  *If you need a refill on your cardiac medications before your next appointment, please call your pharmacy*  Lab Work: NONE   If you have labs (blood work) drawn today and your tests are completely normal, you will receive your results only by: MyChart Message (if you have MyChart) OR A paper copy in the mail If you have any lab test that is abnormal or we need to change your treatment, we will call you to review the results.  Testing/Procedures: NONE   Follow-Up: At [facility], you and your health needs are our priority.  As part of our continuing mission to provide you with exceptional heart care, our providers are all part of one team.  This team includes your primary Cardiologist (physician) and Advanced Practice Providers or APPs (Physician Assistants and Nurse Practitioners) who all work together to provide you with the care you need, when you need it.  Your next appointment:   1 year(s)  Provider:   Dorn Ross, MD or Laymon Qua, PA-C    We recommend signing up for the patient portal called MyChart.  Sign up information is provided on this After Visit Summary.  MyChart is used to connect with patients for Virtual Visits (Telemedicine).  Patients are able to view lab/test results, encounter notes, upcoming appointments, etc.  Non-urgent messages can be sent to your provider as well.   To learn more about what you can do with MyChart, go to [email or web address].   Other Instructions Thank you for choosing [facility]!

## 2024-11-06 ENCOUNTER — Ambulatory Visit: Admitting: Student

## 2025-01-22 ENCOUNTER — Ambulatory Visit (HOSPITAL_BASED_OUTPATIENT_CLINIC_OR_DEPARTMENT_OTHER): Admitting: Pulmonary Disease
